# Patient Record
Sex: Female | Born: 1940
Health system: Southern US, Community
[De-identification: ages and names within clinical notes are randomized; demographics above are authoritative.]

## PROBLEM LIST (undated history)

## (undated) DIAGNOSIS — N183 Chronic kidney disease, stage 3 (moderate): Secondary | ICD-10-CM

## (undated) DIAGNOSIS — E785 Hyperlipidemia, unspecified: Secondary | ICD-10-CM

## (undated) DIAGNOSIS — I2510 Atherosclerotic heart disease of native coronary artery without angina pectoris: Secondary | ICD-10-CM

## (undated) DIAGNOSIS — I10 Essential (primary) hypertension: Secondary | ICD-10-CM

## (undated) HISTORY — DX: Essential (primary) hypertension: I10

## (undated) HISTORY — PX: CORONARY ANGIOPLASTY WITH STENT PLACEMENT: SHX49

---

## 2013-10-02 DIAGNOSIS — Z1329 Encounter for screening for other suspected endocrine disorder: Secondary | ICD-10-CM | POA: Diagnosis not present

## 2013-10-02 DIAGNOSIS — I10 Essential (primary) hypertension: Secondary | ICD-10-CM | POA: Diagnosis not present

## 2013-10-02 DIAGNOSIS — E559 Vitamin D deficiency, unspecified: Secondary | ICD-10-CM | POA: Diagnosis not present

## 2013-10-02 DIAGNOSIS — Z136 Encounter for screening for cardiovascular disorders: Secondary | ICD-10-CM | POA: Diagnosis not present

## 2013-10-02 DIAGNOSIS — Z13220 Encounter for screening for lipoid disorders: Secondary | ICD-10-CM | POA: Diagnosis not present

## 2013-10-02 DIAGNOSIS — M1990 Unspecified osteoarthritis, unspecified site: Secondary | ICD-10-CM | POA: Diagnosis not present

## 2014-03-22 ENCOUNTER — Observation Stay (HOSPITAL_COMMUNITY)
Admission: EM | Admit: 2014-03-22 | Discharge: 2014-03-25 | Disposition: A | Discharge status: Home / Self Care | Payer: Medicare Other | Attending: Family Medicine | Admitting: Family Medicine

## 2014-03-22 ENCOUNTER — Encounter (HOSPITAL_COMMUNITY): Payer: Self-pay | Admitting: Emergency Medicine

## 2014-03-22 DIAGNOSIS — Z23 Encounter for immunization: Secondary | ICD-10-CM | POA: Insufficient documentation

## 2014-03-22 DIAGNOSIS — E43 Unspecified severe protein-calorie malnutrition: Secondary | ICD-10-CM

## 2014-03-22 DIAGNOSIS — I872 Venous insufficiency (chronic) (peripheral): Secondary | ICD-10-CM

## 2014-03-22 DIAGNOSIS — I160 Hypertensive urgency: Secondary | ICD-10-CM

## 2014-03-22 DIAGNOSIS — I2510 Atherosclerotic heart disease of native coronary artery without angina pectoris: Secondary | ICD-10-CM | POA: Insufficient documentation

## 2014-03-22 DIAGNOSIS — E876 Hypokalemia: Secondary | ICD-10-CM

## 2014-03-22 DIAGNOSIS — D509 Iron deficiency anemia, unspecified: Secondary | ICD-10-CM

## 2014-03-22 DIAGNOSIS — R4701 Aphasia: Secondary | ICD-10-CM | POA: Insufficient documentation

## 2014-03-22 DIAGNOSIS — Z9861 Coronary angioplasty status: Secondary | ICD-10-CM | POA: Insufficient documentation

## 2014-03-22 DIAGNOSIS — Z681 Body mass index (BMI) 19 or less, adult: Secondary | ICD-10-CM | POA: Insufficient documentation

## 2014-03-22 DIAGNOSIS — G319 Degenerative disease of nervous system, unspecified: Secondary | ICD-10-CM | POA: Insufficient documentation

## 2014-03-22 DIAGNOSIS — I10 Essential (primary) hypertension: Principal | ICD-10-CM | POA: Diagnosis present

## 2014-03-22 HISTORY — DX: Atherosclerotic heart disease of native coronary artery without angina pectoris: I25.10

## 2014-03-22 LAB — CBC WITH DIFFERENTIAL/PLATELET
Basophils Absolute: 0 10*3/uL (ref 0.0–0.1)
Basophils Relative: 0 % (ref 0–1)
Eosinophils Absolute: 0 10*3/uL (ref 0.0–0.7)
Eosinophils Relative: 1 % (ref 0–5)
HCT: 31.3 % — ABNORMAL LOW (ref 36.0–46.0)
Hemoglobin: 10.2 g/dL — ABNORMAL LOW (ref 12.0–15.0)
Lymphocytes Relative: 19 % (ref 12–46)
Lymphs Abs: 1 10*3/uL (ref 0.7–4.0)
MCH: 25.9 pg — ABNORMAL LOW (ref 26.0–34.0)
MCHC: 32.6 g/dL (ref 30.0–36.0)
MCV: 79.4 fL (ref 78.0–100.0)
Monocytes Absolute: 0.6 10*3/uL (ref 0.1–1.0)
Monocytes Relative: 11 % (ref 3–12)
Neutro Abs: 3.6 10*3/uL (ref 1.7–7.7)
Neutrophils Relative %: 69 % (ref 43–77)
Platelets: 509 10*3/uL — ABNORMAL HIGH (ref 150–400)
RBC: 3.94 MIL/uL (ref 3.87–5.11)
RDW: 19.2 % — ABNORMAL HIGH (ref 11.5–15.5)
WBC: 5.2 10*3/uL (ref 4.0–10.5)

## 2014-03-22 LAB — I-STAT CHEM 8, ED
BUN: 14 mg/dL (ref 6–23)
Calcium, Ion: 1.18 mmol/L (ref 1.13–1.30)
Chloride: 103 mEq/L (ref 96–112)
Creatinine, Ser: 1.2 mg/dL — ABNORMAL HIGH (ref 0.50–1.10)
Glucose, Bld: 121 mg/dL — ABNORMAL HIGH (ref 70–99)
HCT: 35 % — ABNORMAL LOW (ref 36.0–46.0)
Hemoglobin: 11.9 g/dL — ABNORMAL LOW (ref 12.0–15.0)
Potassium: 3.2 mEq/L — ABNORMAL LOW (ref 3.7–5.3)
Sodium: 142 mEq/L (ref 137–147)
TCO2: 26 mmol/L (ref 0–100)

## 2014-03-22 MED ORDER — METOPROLOL TARTRATE 100 MG PO TABS: 50.0000 mg | ORAL_TABLET | Freq: Two times a day (BID) | ORAL | Status: DC

## 2014-03-22 MED ORDER — CLONIDINE HCL 0.2 MG PO TABS
0.3000 mg | ORAL_TABLET | Freq: Once | ORAL | Status: DC
Start: 2014-03-22 — End: 2014-03-22

## 2014-03-22 MED ORDER — LISINOPRIL 10 MG PO TABS: 10.0000 mg | ORAL_TABLET | Freq: Every day | ORAL | Status: DC

## 2014-03-22 MED ORDER — CLONIDINE HCL 0.2 MG PO TABS
0.2000 mg | ORAL_TABLET | Freq: Once | ORAL | Status: AC
  Administered 2014-03-22: 0.2 mg via ORAL
  Filled 2014-03-22: qty 1

## 2014-03-22 MED ORDER — NICARDIPINE HCL IN NACL 20-0.86 MG/200ML-% IV SOLN
3.0000 mg/h | Freq: Once | INTRAVENOUS | Status: AC
  Administered 2014-03-22: 5 mg/h via INTRAVENOUS
  Filled 2014-03-22: qty 200

## 2014-03-22 MED ORDER — LABETALOL HCL 100 MG PO TABS
100.0000 mg | ORAL_TABLET | Freq: Once | ORAL | Status: AC
  Administered 2014-03-22: 100 mg via ORAL
  Filled 2014-03-22: qty 1

## 2014-03-22 MED ORDER — LABETALOL HCL 5 MG/ML IV SOLN
10.0000 mg | Freq: Once | INTRAVENOUS | Status: AC
  Administered 2014-03-22: 10 mg via INTRAVENOUS
  Filled 2014-03-22: qty 4

## 2014-03-22 MED ORDER — CLONIDINE HCL 0.1 MG PO TABS
0.1000 mg | ORAL_TABLET | Freq: Once | ORAL | Status: AC
  Administered 2014-03-23: 0.1 mg via ORAL
  Filled 2014-03-22: qty 1

## 2014-03-22 NOTE — ED Provider Notes (Signed)
Patient presents with hypertension. She is asymptomatic, but measured her BP today and it was elevated. She received Labetalol PO here. We will recheck vital signs and if systolic BP drops below 914 can likely d/c home.   Patient's BP is rising with 100mg  PO Labetalol. Discussed case with Dr. Alvino Chapel. Will give 10 mg IV Labetalol and admit patient to medicine as she has no follow up.   Discussed case with Dr. Arnoldo Morale. Will start patient on Cardene drip and admit to step down. Vital signs stable at this time.   11:43 PM Discussed case with Dr. Jimmy Footman who recommends stopping cardene drip and starting clonidine 0.2, 0.1 in one hour, 0.1 in 30 minutes. If her systolic drops below 782 she can go to step down unit.   Vital signs are improving. Patient is stable for step down unit. Discussed this with Dr. Arnoldo Morale who will admit patient. Admission is appreciated. Patient / Family / Caregiver informed of clinical course, understand medical decision-making process, and agree with plan.   Filed Vitals:   03/23/14 0130 03/23/14 0135 03/23/14 0138 03/23/14 0200  BP: 188/107  188/107   Pulse:      Temp:  99 F (37.2 C)    TempSrc:  Oral    Resp: 21     Height:    5\' 5"  (1.651 m)  Weight:    107 lb 2.3 oz (48.6 kg)  SpO2:         Results for orders placed during the hospital encounter of 03/22/14  CBC WITH DIFFERENTIAL      Result Value Ref Range   WBC 5.2  4.0 - 10.5 K/uL   RBC 3.94  3.87 - 5.11 MIL/uL   Hemoglobin 10.2 (*) 12.0 - 15.0 g/dL   HCT 31.3 (*) 36.0 - 46.0 %   MCV 79.4  78.0 - 100.0 fL   MCH 25.9 (*) 26.0 - 34.0 pg   MCHC 32.6  30.0 - 36.0 g/dL   RDW 19.2 (*) 11.5 - 15.5 %   Platelets 509 (*) 150 - 400 K/uL   Neutrophils Relative % 69  43 - 77 %   Neutro Abs 3.6  1.7 - 7.7 K/uL   Lymphocytes Relative 19  12 - 46 %   Lymphs Abs 1.0  0.7 - 4.0 K/uL   Monocytes Relative 11  3 - 12 %   Monocytes Absolute 0.6  0.1 - 1.0 K/uL   Eosinophils Relative 1  0 - 5 %   Eosinophils  Absolute 0.0  0.0 - 0.7 K/uL   Basophils Relative 0  0 - 1 %   Basophils Absolute 0.0  0.0 - 0.1 K/uL  I-STAT CHEM 8, ED      Result Value Ref Range   Sodium 142  137 - 147 mEq/L   Potassium 3.2 (*) 3.7 - 5.3 mEq/L   Chloride 103  96 - 112 mEq/L   BUN 14  6 - 23 mg/dL   Creatinine, Ser 1.20 (*) 0.50 - 1.10 mg/dL   Glucose, Bld 121 (*) 70 - 99 mg/dL   Calcium, Ion 1.18  1.13 - 1.30 mmol/L   TCO2 26  0 - 100 mmol/L   Hemoglobin 11.9 (*) 12.0 - 15.0 g/dL   HCT 35.0 (*) 36.0 - 46.0 %     Elwyn Lade, PA-C 03/22/14 [place], PA-C 03/23/14 0215

## 2014-03-22 NOTE — ED Notes (Signed)
MD at bedside. 

## 2014-03-22 NOTE — ED Notes (Signed)
Pt here for recheck of blood pressure. Per family pt was seen here a while back and was told her blood pressure was elevated and she needed to follow up with a doctor. Pt does not have a pcp. Pt denies any associated sx, no headache, cp, sob, or visual changes. Pt alert and oriented x4.

## 2014-03-22 NOTE — ED Notes (Signed)
PT is here with family to have blood pressure evaluated because months ago had high blood pressure but nothing done about it.  Just here to see if she needs medication for it.  Pt denies weakness, headache, blurred vision, or chest pain

## 2014-03-22 NOTE — ED Notes (Signed)
PA at bedside.

## 2014-03-22 NOTE — ED Provider Notes (Signed)
CSN: [id number]     Arrival date & time 03/22/14  1618 History   First MD Initiated Contact with Patient 03/22/14 1905     Chief Complaint  Patient presents with  . Hypertension     (Consider location/radiation/quality/duration/timing/severity/associated sxs/prior Treatment) HPI Comments: Patient with no known medical history.  She presents with complaints of elevated blood pressure.  She was seen at an Urgent Care several months ago and was told it was elevated.  She never followed up.  Today her son checked her BP and it was markedly elevated and brings her for evaluation of this.  According to the patient and the family, she had a blocked artery "in the 70's" and spent a month in the hospital, but did not see another doctor until her urgent care visit described above.  She has no symptoms.  Denies headache, chest pain.  Patient is a 73 y.o. female presenting with hypertension. The history is provided by the patient.  Hypertension This is a new problem. The problem occurs constantly. The problem has not changed since onset.Pertinent negatives include no chest pain, no abdominal pain, no headaches and no shortness of breath. Nothing aggravates the symptoms. Nothing relieves the symptoms. She has tried nothing for the symptoms. The treatment provided no relief.    Past Medical History  Diagnosis Date  . Coronary artery disease    Past Surgical History  Procedure Laterality Date  . Coronary angioplasty with stent placement     No family history on file. History  Substance Use Topics  . Smoking status: Never Smoker   . Smokeless tobacco: Not on file  . Alcohol Use: No   OB History   Grav Para Term Preterm Abortions TAB SAB Ect Mult Living                 Review of Systems  Respiratory: Negative for shortness of breath.   Cardiovascular: Negative for chest pain.  Gastrointestinal: Negative for abdominal pain.  Neurological: Negative for headaches.  All other systems reviewed  and are negative.     Allergies  Review of patient's allergies indicates no known allergies.  Home Medications   Prior to Admission medications   Not on File   BP 222/101  Pulse 95  Temp(Src) 98.6 F (37 C) (Oral)  Resp 20  SpO2 99% Physical Exam  Nursing note and vitals reviewed. Constitutional: She is oriented to person, place, and time. She appears well-developed and well-nourished. No distress.  HENT:  Head: Normocephalic and atraumatic.  Mouth/Throat: Oropharynx is clear and moist.  Eyes: EOM are normal. Pupils are equal, round, and reactive to light.  Neck: Normal range of motion. Neck supple.  Cardiovascular: Normal rate and regular rhythm.  Exam reveals no gallop and no friction rub.   No murmur heard. Pulmonary/Chest: Effort normal and breath sounds normal. No respiratory distress. She has no wheezes.  Abdominal: Soft. Bowel sounds are normal. She exhibits no distension. There is no tenderness.  Musculoskeletal: Normal range of motion.  Neurological: She is alert and oriented to person, place, and time. No cranial nerve deficit. She exhibits normal muscle tone. Coordination normal.  Skin: Skin is warm and dry. She is not diaphoretic.    ED Course  Procedures (including critical care time) Labs Review Labs Reviewed  I-STAT CHEM 8, ED - Abnormal; Notable for the following:    Potassium 3.2 (*)    Creatinine, Ser 1.20 (*)    Glucose, Bld 121 (*)    Hemoglobin 11.9 (*)  HCT 35.0 (*)    All other components within normal limits  URINALYSIS, ROUTINE W REFLEX MICROSCOPIC    Imaging Review No results found.   EKG Interpretation   Date/Time:  Friday March 22 2014 19:37:28 EDT Ventricular Rate:  86 PR Interval:  185 QRS Duration: 108 QT Interval:  384 QTC Calculation: 459 R Axis:   23 Text Interpretation:  Sinus rhythm Probable left atrial enlargement Left  ventricular hypertrophy Anterior infarct, old Confirmed by DELOS  MD,  Tomoko Sandra ([id number]) on  03/23/2014 1:10:47 PM      MDM   Final diagnoses:  None    Patient presents with markedly elevated blood pressure, but no other symptoms. Suspect her blood pressure has been elevated for some time and she has not been to a physician in many years. Workup reveals essentially unremarkable laboratory studies and EKG not showing any acute process. She is neurologically intact.  She will be given labetalol and we will follow her blood pressure to see if it improves. She will be rechecked in one hour and the disposition will be left up to [facility].    Veryl Speak, MD 03/23/14 [phone]

## 2014-03-23 DIAGNOSIS — I059 Rheumatic mitral valve disease, unspecified: Secondary | ICD-10-CM

## 2014-03-23 DIAGNOSIS — I872 Venous insufficiency (chronic) (peripheral): Secondary | ICD-10-CM | POA: Diagnosis present

## 2014-03-23 DIAGNOSIS — D509 Iron deficiency anemia, unspecified: Secondary | ICD-10-CM | POA: Diagnosis present

## 2014-03-23 DIAGNOSIS — E876 Hypokalemia: Secondary | ICD-10-CM | POA: Diagnosis present

## 2014-03-23 DIAGNOSIS — I10 Essential (primary) hypertension: Secondary | ICD-10-CM | POA: Diagnosis present

## 2014-03-23 DIAGNOSIS — E43 Unspecified severe protein-calorie malnutrition: Secondary | ICD-10-CM | POA: Insufficient documentation

## 2014-03-23 DIAGNOSIS — I8310 Varicose veins of unspecified lower extremity with inflammation: Secondary | ICD-10-CM

## 2014-03-23 LAB — BASIC METABOLIC PANEL
Anion gap: 16 — ABNORMAL HIGH (ref 5–15)
BUN: 14 mg/dL (ref 6–23)
CO2: 25 meq/L (ref 19–32)
Calcium: 9.3 mg/dL (ref 8.4–10.5)
Chloride: 100 mEq/L (ref 96–112)
Creatinine, Ser: 1.15 mg/dL — ABNORMAL HIGH (ref 0.50–1.10)
GFR calc non Af Amer: 47 mL/min — ABNORMAL LOW (ref >90)
GFR, EST AFRICAN AMERICAN: 54 mL/min — AB (ref >90)
GLUCOSE: 129 mg/dL — AB (ref 70–99)
POTASSIUM: 3.8 meq/L (ref 3.7–5.3)
Sodium: 141 mEq/L (ref 137–147)

## 2014-03-23 LAB — URINALYSIS, ROUTINE W REFLEX MICROSCOPIC
BILIRUBIN URINE: NEGATIVE
Glucose, UA: NEGATIVE mg/dL
HGB URINE DIPSTICK: NEGATIVE
Ketones, ur: NEGATIVE mg/dL
Nitrite: NEGATIVE
Protein, ur: 30 mg/dL — AB
SPECIFIC GRAVITY, URINE: 1.009 (ref 1.005–1.030)
UROBILINOGEN UA: 0.2 mg/dL (ref 0.0–1.0)
pH: 7.5 (ref 5.0–8.0)

## 2014-03-23 LAB — CBC
HCT: 30.7 % — ABNORMAL LOW (ref 36.0–46.0)
Hemoglobin: 10.1 g/dL — ABNORMAL LOW (ref 12.0–15.0)
MCH: 26 pg (ref 26.0–34.0)
MCHC: 32.9 g/dL (ref 30.0–36.0)
MCV: 79.1 fL (ref 78.0–100.0)
Platelets: 497 10*3/uL — ABNORMAL HIGH (ref 150–400)
RBC: 3.88 MIL/uL (ref 3.87–5.11)
RDW: 19.5 % — ABNORMAL HIGH (ref 11.5–15.5)
WBC: 5.4 10*3/uL (ref 4.0–10.5)

## 2014-03-23 LAB — URINE MICROSCOPIC-ADD ON

## 2014-03-23 LAB — MRSA PCR SCREENING: MRSA by PCR: NEGATIVE

## 2014-03-23 MED ORDER — ACETAMINOPHEN 325 MG PO TABS: 650.0000 mg | ORAL_TABLET | Freq: Four times a day (QID) | ORAL | Status: DC | PRN

## 2014-03-23 MED ORDER — ACETAMINOPHEN 650 MG RE SUPP: 650.0000 mg | Freq: Four times a day (QID) | RECTAL | Status: DC | PRN

## 2014-03-23 MED ORDER — OXYCODONE HCL 5 MG PO TABS: 5.0000 mg | ORAL_TABLET | ORAL | Status: DC | PRN

## 2014-03-23 MED ORDER — ONDANSETRON HCL 4 MG/2ML IJ SOLN: 4.0000 mg | Freq: Four times a day (QID) | INTRAMUSCULAR | Status: DC | PRN

## 2014-03-23 MED ORDER — SODIUM CHLORIDE 0.9 % IJ SOLN: 3.0000 mL | INTRAMUSCULAR | Status: DC | PRN

## 2014-03-23 MED ORDER — SODIUM CHLORIDE 0.9 % IJ SOLN
3.0000 mL | Freq: Two times a day (BID) | INTRAMUSCULAR | Status: DC
  Administered 2014-03-23 – 2014-03-24 (×3): 3 mL via INTRAVENOUS

## 2014-03-23 MED ORDER — ONDANSETRON HCL 4 MG PO TABS: 4.0000 mg | ORAL_TABLET | Freq: Four times a day (QID) | ORAL | Status: DC | PRN

## 2014-03-23 MED ORDER — CLONIDINE HCL 0.1 MG PO TABS
0.1000 mg | ORAL_TABLET | Freq: Two times a day (BID) | ORAL | Status: DC
  Administered 2014-03-23 – 2014-03-24 (×3): 0.1 mg via ORAL
  Filled 2014-03-23 (×5): qty 1

## 2014-03-23 MED ORDER — BIOTENE DRY MOUTH MT LIQD
15.0000 mL | Freq: Two times a day (BID) | OROMUCOSAL | Status: DC
  Administered 2014-03-23 – 2014-03-25 (×4): 15 mL via OROMUCOSAL

## 2014-03-23 MED ORDER — PNEUMOCOCCAL VAC POLYVALENT 25 MCG/0.5ML IJ INJ
0.5000 mL | INJECTION | INTRAMUSCULAR | Status: AC
  Administered 2014-03-24: 0.5 mL via INTRAMUSCULAR
  Filled 2014-03-23: qty 0.5

## 2014-03-23 MED ORDER — ENSURE COMPLETE PO LIQD: 237.0000 mL | Freq: Every day | ORAL | Status: DC

## 2014-03-23 MED ORDER — HYDROMORPHONE HCL PF 1 MG/ML IJ SOLN: 0.5000 mg | INTRAMUSCULAR | Status: DC | PRN

## 2014-03-23 MED ORDER — HYDRALAZINE HCL 20 MG/ML IJ SOLN: 10.0000 mg | Freq: Four times a day (QID) | INTRAMUSCULAR | Status: DC | PRN

## 2014-03-23 MED ORDER — CLONIDINE HCL 0.1 MG PO TABS
0.1000 mg | ORAL_TABLET | Freq: Four times a day (QID) | ORAL | Status: DC | PRN
  Administered 2014-03-24: 0.1 mg via ORAL
  Filled 2014-03-23: qty 1

## 2014-03-23 MED ORDER — ALUM & MAG HYDROXIDE-SIMETH 200-200-20 MG/5ML PO SUSP
30.0000 mL | Freq: Four times a day (QID) | ORAL | Status: DC | PRN
Start: 2014-03-23 — End: 2014-03-25

## 2014-03-23 MED ORDER — SODIUM CHLORIDE 0.9 % IV SOLN: 250.0000 mL | INTRAVENOUS | Status: DC | PRN

## 2014-03-23 MED ORDER — ENSURE PUDDING PO PUDG
1.0000 | Freq: Two times a day (BID) | ORAL | Status: DC
  Administered 2014-03-23: 1 via ORAL

## 2014-03-23 MED ORDER — CLONIDINE HCL 0.1 MG PO TABS
0.1000 mg | ORAL_TABLET | Freq: Once | ORAL | Status: AC
  Administered 2014-03-23: 0.1 mg via ORAL
  Filled 2014-03-23: qty 1

## 2014-03-23 NOTE — Progress Notes (Signed)
Patient refused Clonidine 0.1mg  PO at this time. Educated patient on importance of getting blood pressure lower. Patient voiced she wanted to pray about at this time, and did not want to take any more medication. Will continue to monitor.

## 2014-03-23 NOTE — Progress Notes (Signed)
INITIAL NUTRITION ASSESSMENT  DOCUMENTATION CODES Per approved criteria  -Severe malnutrition in the context of chronic illness   INTERVENTION:  Continue soft diet.  Ensure Complete PO daily each supplement provides 350 kcal and 13 grams of protein  Ensure Pudding PO BID, each supplement provides 170 kcal and 4 grams of protein  NUTRITION DIAGNOSIS: Inadequate oral intake related to difficulty chewing as evidenced by 13 lb weight loss PTA.   Goal: Intake to meet >90% of estimated nutrition needs.  Monitor:  PO intake, labs, weight trend.  Reason for Assessment: Consult for poor dentition, difficulty chewing  73 y.o. female  Admitting Dx: Malignant hypertension  ASSESSMENT: 73 y.o. female who has not been seen by a doctor in many years who was brought to the ED by her family when her son checked her blood pressure and found it to be very elevated at 246/114. Patient denies having any headache or chest pain or nausea or vomiting.   Nutrition Focused Physical Exam:  Subcutaneous Fat:  Orbital Region: mild depletion Upper Arm Region: WNL Thoracic and Lumbar Region: WNL  Muscle:  Temple Region: moderate depletion Clavicle Bone Region: mild depletion Clavicle and Acromion Bone Region: mild depletion Scapular Bone Region: WNL Dorsal Hand: severe depletion Patellar Region: WNL Anterior Thigh Region: mild depletion Posterior Calf Region: WNL  Edema: 2-3+ edema of BLEs  Pt meets criteria for severe MALNUTRITION in the context of chronic illness as evidenced by severe depletion of muscle mass with 11% weight loss in the past 3 months.  Patient with poor dentition, one dangling tooth on the bottom. She has trouble chewing some tough foods. Reports weight loss of ~13 lbs over the past 2-3 months. She has never tried Ensure supplements, but is willing to try.  Height: Ht Readings from Last 1 Encounters:  03/23/14 5\' 5"  (1.651 m)    Weight: Wt Readings from Last 1  Encounters:  03/23/14 107 lb 2.3 oz (48.6 kg)    Ideal Body Weight: 56.8 kg  % Ideal Body Weight: 86%  Wt Readings from Last 10 Encounters:  03/23/14 107 lb 2.3 oz (48.6 kg)    Usual Body Weight: 120 lb  % Usual Body Weight: 89%  BMI:  Body mass index is 17.83 kg/(m^2). Underweight  Estimated Nutritional Needs: Kcal: 1400-1600 Protein: 70-85 gm Fluid: 1.4-1.6 L  Skin: no issues  Diet Order: Criss Rosales  EDUCATION NEEDS: -Education needs addressed   Intake/Output Summary (Last 24 hours) at 03/23/14 1016 Last data filed at 03/23/14 0800  Gross per 24 hour  Intake    220 ml  Output    400 ml  Net   -180 ml    Last BM: None documented since admission    Labs:   Recent Labs Lab 03/22/14 1653 03/23/14 0347  NA 142 141  K 3.2* 3.8  CL 103 100  CO2  --  25  BUN 14 14  CREATININE 1.20* 1.15*  CALCIUM  --  9.3  GLUCOSE 121* 129*    CBG (last 3)  No results found for this basename: GLUCAP,  in the last 72 hours  Scheduled Meds: . antiseptic oral rinse  15 mL Mouth Rinse BID  . cloNIDine  0.1 mg Oral BID  . [START ON 03/24/2014] pneumococcal 23 valent vaccine  0.5 mL Intramuscular Tomorrow-1000  . sodium chloride  3 mL Intravenous Q12H    Continuous Infusions:   Past Medical History  Diagnosis Date  . Coronary artery disease     Past  Surgical History  Procedure Laterality Date  . Coronary angioplasty with stent placement      Molli Barrows, RD, LDN, [place] Pager [phone] After Hours Pager [phone]

## 2014-03-23 NOTE — ED Notes (Signed)
Patient becoming restless.  States she wants to go home.  Wants up out of bed.  Patient up to bathroom again.  Will send off ua due to sx

## 2014-03-23 NOTE — H&P (Signed)
[facility] History and Physical  Angellynn Kimberlin OZH:[id number] DOB: 1941/06/23 DOA: 03/22/2014  Referring physician:  PCP: No PCP Per Patient  Specialists:   Chief Complaint:  Increased Blood Pressure  HPI: Ashlee Mack is a 73 y.o. female who has not been seen by a doctor in many years who was brought to the ED by her family when her son checked her blood pressure and found it to be very elevated at 246/114.   Patient denies having any headache or chest pain or nausea or vomiting. She had been told in the past that she had high blood pressure.    She was referred for medical admission.      Review of Systems:  Constitutional: No Weight Loss, No Weight Gain, Night Sweats, Fevers, Chills, Fatigue, or Generalized Weakness HEENT: No Headaches, Difficulty Swallowing,Tooth/Dental Problems,Sore Throat,  No Sneezing, Rhinitis, Ear Ache, Nasal Congestion, or Post Nasal Drip,  Cardio-vascular:  No Chest pain, Orthopnea, PND, +Edema in lower extremities, Anasarca, Dizziness, Palpitations  Resp: No Dyspnea, No DOE, No Productive Cough, No Non-Productive Cough, No Hemoptysis, No Change in Color of Mucus,  No Wheezing.    GI: No Heartburn, Indigestion, Abdominal Pain, Nausea, Vomiting, Diarrhea, Change in Bowel Habits,  Loss of Appetite  GU: No Dysuria, Change in Color of Urine, No Urgency or Frequency.  No flank pain.  Musculoskeletal: No Joint Pain or Swelling.  No Decreased Range of Motion. No Back Pain.  Neurologic: No Syncope, No Seizures, Muscle Weakness, Paresthesia, Vision Disturbance or Loss, No Diplopia, No Vertigo, No Difficulty Walking,  Skin: No Rash or Lesions. Psych: No Change in Mood or Affect. No Depression or Anxiety. No Memory loss. No Confusion or Hallucinations   Past Medical History  Diagnosis Date  . Coronary artery disease       Past Surgical History  Procedure Laterality Date  . Coronary angioplasty with stent placement         Prior to Admission medications    Medication Sig Start Date End Date Taking? Authorizing Provider  lisinopril (PRINIVIL,ZESTRIL) 10 MG tablet Take 1 tablet (10 mg total) by mouth daily. 03/22/14   Veryl Speak, MD  metoprolol (LOPRESSOR) 100 MG tablet Take 0.5 tablets (50 mg total) by mouth 2 (two) times daily. 03/22/14   Veryl Speak, MD     No Known Allergies   Social History:  reports that she has never smoked. She does not have any smokeless tobacco history on file. She reports that she does not drink alcohol or use illicit drugs.     No family history on file.     Physical Exam:  GEN:  Pleasant Elderly 73 y.o. African American female examined and in no acute distress; cooperative with exam Filed Vitals:   03/23/14 0346 03/23/14 0400 03/23/14 0500 03/23/14 0600  BP:  117/69 103/62 115/63  Pulse:  56 60 64  Temp: 97.5 F (36.4 C)     TempSrc: Oral     Resp:  20 21 21   Height:      Weight:      SpO2:  100% 100% 100%   Blood pressure 115/63, pulse 64, temperature 97.5 F (36.4 C), temperature source Oral, resp. rate 21, height 5\' 5"  (1.651 m), weight 48.6 kg (107 lb 2.3 oz), SpO2 100.00%. PSYCH: She is alert and oriented x3; does not appear anxious does not appear depressed; affect is normal HEENT: Normocephalic and Atraumatic, Mucous membranes pink; PERRLA; EOM intact; Fundi:  Benign;  No scleral icterus, Nares: Patent, Oropharynx:  Clear, Sparse Poor Dentition, Neck:  FROM, no cervical lymphadenopathy nor thyromegaly or carotid bruit; no JVD; Breasts:: Not examined CHEST WALL: No tenderness CHEST: Normal respiration, clear to auscultation bilaterally HEART: Regular rate and rhythm; no murmurs rubs or gallops BACK: No kyphosis or scoliosis; no CVA tenderness ABDOMEN: Positive Bowel Sounds, soft non-tender; no masses, no organomegaly, Rectal Exam: Not done EXTREMITIES: No cyanosis, clubbing or 2=3+ Edema of BLEs with hyperpigmentation and cobbled stoned appearance of the distal aspect of the BLEs.; no  ulcerations. Genitalia: not examined PULSES: 2+ and symmetric SKIN: Normal hydration no rash or ulceration CNS:  Alert and Oriented x 3, No focal Deficits.    Vascular: pulses palpable throughout    Labs on Admission:  Basic Metabolic Panel:  Recent Labs Lab 03/22/14 1653 03/23/14 0347  NA 142 141  K 3.2* 3.8  CL 103 100  CO2  --  25  GLUCOSE 121* 129*  BUN 14 14  CREATININE 1.20* 1.15*  CALCIUM  --  9.3   Liver Function Tests: No results found for this basename: AST, ALT, ALKPHOS, BILITOT, PROT, ALBUMIN,  in the last 168 hours No results found for this basename: LIPASE, AMYLASE,  in the last 168 hours No results found for this basename: AMMONIA,  in the last 168 hours CBC:  Recent Labs Lab 03/22/14 1653 03/22/14 2130 03/23/14 0347  WBC  --  5.2 5.4  NEUTROABS  --  3.6  --   HGB 11.9* 10.2* 10.1*  HCT 35.0* 31.3* 30.7*  MCV  --  79.4 79.1  PLT  --  509* 497*   Cardiac Enzymes: No results found for this basename: CKTOTAL, CKMB, CKMBINDEX, TROPONINI,  in the last 168 hours  BNP (last 3 results) No results found for this basename: PROBNP,  in the last 8760 hours CBG: No results found for this basename: GLUCAP,  in the last 168 hours  Radiological Exams on Admission: No results found.   EKG: Independently reviewed.  Normal sinus Rhythm with LVH changes, Old Anterior Infarct   Assessment/Plan:   73 y.o. female with  Principal Problem:   Malignant hypertension Active Problems:   Uncontrolled hypertension   Chronic venous stasis dermatitis of both lower extremities   Hypokalemia   Anemia, iron deficiency    1.  Malignant HTN/ Uncontrolled HTN-   Placed on Cloniidne 0.1 mg PO BID with PRN IV Hydralazine for SBP > 200 and PRN Clondine for SBP > 180.   Monitor BPs,  Check TSH level.    2.  Chronic Venous Stasis of BLEs/ Edema-  Suspect that she has CHF,  2D ECHO ordered for Evaluation.   May need Diuretic Rx.    3.  Hypokalemia-  K+ Repletion , monitor K+  levels.    4.  Anemia Iron deficiency-  Send Anemia panel and FOBT.     5. DVT prophylaxis with SCDs.     6. Other-   Needs Referral to a PCP for continued management.       Code Status:  FULL CODE     Family Communication:   Family at Bedside Disposition Plan:       Inpatient  Time spent:  [place] C [facility] Pager [phone]  If 7PM-7AM, please contact night-coverage [email or web address] Password [email or web address] 03/23/2014, 7:12 AM

## 2014-03-23 NOTE — Care Management Utilization Note (Signed)
UR completed.    Genesia Caslin Wise Jeanie Mccard, RN, BSN Phone #[phone]  

## 2014-03-23 NOTE — ED Notes (Signed)
Patient voided again prior to transport, clear yellow urine.

## 2014-03-23 NOTE — ED Provider Notes (Signed)
Medical screening examination/treatment/procedure(s) were performed by non-physician practitioner and as supervising physician I was immediately available for consultation/collaboration.   EKG Interpretation   Date/Time:  Friday March 22 2014 19:37:28 EDT Ventricular Rate:  86 PR Interval:  185 QRS Duration: 108 QT Interval:  384 QTC Calculation: 459 R Axis:   23 Text Interpretation:  Sinus rhythm Probable left atrial enlargement Left  ventricular hypertrophy Anterior infarct, old Confirmed by DELOS  MD,  DOUGLAS ([id number]) on 03/23/2014 1:10:47 PM       Jasper Riling. Alvino Chapel, MD 03/23/14 1450

## 2014-03-23 NOTE — ED Notes (Signed)
Patient is resting.  She denies any sob/chest pain.  She and family updated on plan and new medication that was given.  Advised that we will repeat in 1 hour as well.  She remains on monitoring.

## 2014-03-23 NOTE — Progress Notes (Signed)
Patient seen and evaluated earlier this AM by my associate.   Will reassess next am.  Velvet Bathe

## 2014-03-23 NOTE — Progress Notes (Signed)
Echo Lab  2D Echocardiogram completed.  [place], RDCS 03/23/2014 10:21 AM

## 2014-03-23 NOTE — ED Notes (Signed)
Patient requesting to urinate again.  Report given to [facility].

## 2014-03-24 ENCOUNTER — Observation Stay (HOSPITAL_COMMUNITY): Payer: Medicare Other

## 2014-03-24 LAB — GLUCOSE, CAPILLARY: Glucose-Capillary: 105 mg/dL — ABNORMAL HIGH (ref 70–99)

## 2014-03-24 MED ORDER — AMLODIPINE BESYLATE 10 MG PO TABS: 10.0000 mg | ORAL_TABLET | Freq: Every day | ORAL | Status: DC

## 2014-03-24 MED ORDER — DOCUSATE SODIUM 100 MG PO CAPS
100.0000 mg | ORAL_CAPSULE | Freq: Every day | ORAL | Status: DC | PRN
  Administered 2014-03-24: 100 mg via ORAL
  Filled 2014-03-24: qty 1

## 2014-03-24 MED ORDER — HYDRALAZINE HCL 20 MG/ML IJ SOLN
10.0000 mg | Freq: Four times a day (QID) | INTRAMUSCULAR | Status: DC | PRN
  Administered 2014-03-24 (×2): 10 mg via INTRAVENOUS
  Filled 2014-03-24 (×2): qty 1

## 2014-03-24 MED ORDER — LORAZEPAM 2 MG/ML IJ SOLN
1.0000 mg | Freq: Once | INTRAMUSCULAR | Status: AC
  Administered 2014-03-24: 1 mg via INTRAVENOUS

## 2014-03-24 MED ORDER — LISINOPRIL 5 MG PO TABS
5.0000 mg | ORAL_TABLET | Freq: Every day | ORAL | Status: DC
  Filled 2014-03-24 (×2): qty 1

## 2014-03-24 MED ORDER — AMLODIPINE BESYLATE 10 MG PO TABS
10.0000 mg | ORAL_TABLET | Freq: Every day | ORAL | Status: DC
  Filled 2014-03-24 (×2): qty 1

## 2014-03-24 MED ORDER — LORAZEPAM 2 MG/ML IJ SOLN
INTRAMUSCULAR | Status: AC
  Administered 2014-03-24: 1 mg
  Filled 2014-03-24: qty 1

## 2014-03-24 NOTE — Progress Notes (Addendum)
RN alerted by family member that patient very lethargic and has facial droop.  Upon assessment, patient only awakes to sternal rub but is able to follow commands.  All extremities equal in strength, facial symmetry noted but patient non-verbal, unable to speak & falling asleep during assessment.  Dr. Wendee Beavers notified.  CBG 105, BP 188/90, HR 70, O2 98% on RA, RR 26. Orders for CT head received.  Will continue to monitor closely. Jonavon Trieu, Ardeth Sportsman, RN

## 2014-03-24 NOTE — Progress Notes (Addendum)
Patient refusing to to take her BP meds until her husband arrives.  Patient anxious and confused when family is not present.  Patient does not want to have to be on any pills, says she has never taken pills in her whole life.  RN educated patient on reason why she was taking these medications; RN crushed meds and put in puree for patient.  Patient still wary about taking meds.  RN will attempt at a later time when husband is present. Ossie Yebra, Ardeth Sportsman, RN

## 2014-03-24 NOTE — Progress Notes (Signed)
[facility] PROGRESS NOTE  Ashlee Mack TGG:[id number] DOB: 12/04/1940 DOA: 03/22/2014 PCP: No PCP Per Patient  Assessment/Plan:  Principal Problem:   Malignant hypertension - Start patient on Amlodipine and lisinopril.  Lisinopril indicated in lieu of Grade I diastolic dysfunction - Hydralazine IV PRN - Avoiding B blocker due to documented bradycardia  Active Problems:   Hypokalemia - resolved - continue nutritional supplementation.    Anemia, iron deficiency - stable currently    Protein-calorie malnutrition, severe - encourage po intake - Ensure on board - Registered dietitian on board  Code Status:full  Family Communication: no family at bedside Disposition Plan: Pending improvement in blood pressures   Consultants:  None  Procedures:  none  Antibiotics:   none  HPI/Subjective: Pt has no new complaints. No acute issues overnight. Her main concern is for a stool softener  Objective: Filed Vitals:   03/24/14 1028  BP: 202/91  Pulse:   Temp:   Resp: 26    Intake/Output Summary (Last 24 hours) at 03/24/14 1108 Last data filed at 03/24/14 0900  Gross per 24 hour  Intake    543 ml  Output   1050 ml  Net   -507 ml   Filed Weights   03/23/14 0200  Weight: 48.6 kg (107 lb 2.3 oz)    Exam:   General:  Pt in NAD, alert and awake  Cardiovascular: RRR, no MRG  Respiratory: CTA BL, no wheezes  Abdomen: soft, NT, ND  Musculoskeletal: no cyanosis or clubbing   Data Reviewed: Basic Metabolic Panel:  Recent Labs Lab 03/22/14 1653 03/23/14 0347  NA 142 141  K 3.2* 3.8  CL 103 100  CO2  --  25  GLUCOSE 121* 129*  BUN 14 14  CREATININE 1.20* 1.15*  CALCIUM  --  9.3   Liver Function Tests: No results found for this basename: AST, ALT, ALKPHOS, BILITOT, PROT, ALBUMIN,  in the last 168 hours No results found for this basename: LIPASE, AMYLASE,  in the last 168 hours No results found for this basename: AMMONIA,  in the last 168  hours CBC:  Recent Labs Lab 03/22/14 1653 03/22/14 2130 03/23/14 0347  WBC  --  5.2 5.4  NEUTROABS  --  3.6  --   HGB 11.9* 10.2* 10.1*  HCT 35.0* 31.3* 30.7*  MCV  --  79.4 79.1  PLT  --  509* 497*   Cardiac Enzymes: No results found for this basename: CKTOTAL, CKMB, CKMBINDEX, TROPONINI,  in the last 168 hours BNP (last 3 results) No results found for this basename: PROBNP,  in the last 8760 hours CBG: No results found for this basename: GLUCAP,  in the last 168 hours  Recent Results (from the past 240 hour(s))  MRSA PCR SCREENING     Status: None   Collection Time    03/23/14  2:10 AM      Result Value Ref Range Status   MRSA by PCR NEGATIVE  NEGATIVE Final   Comment:            The GeneXpert MRSA Assay (FDA     approved for NASAL specimens     only), is one component of a     comprehensive MRSA colonization     surveillance program. It is not     intended to diagnose MRSA     infection nor to guide or     monitor treatment for     MRSA infections.     Studies: No results  found.  Scheduled Meds: . amLODipine  10 mg Oral Daily  . antiseptic oral rinse  15 mL Mouth Rinse BID  . feeding supplement (ENSURE COMPLETE)  237 mL Oral Q1400  . feeding supplement (ENSURE)  1 Container Oral BID BM  . lisinopril  5 mg Oral Daily  . sodium chloride  3 mL Intravenous Q12H   Continuous Infusions:    Time spent: > 35 minutes    Velvet Bathe  [facility] Pager [phone] If 7PM-7AM, please contact night-coverage at [email or web address], password [facility] 03/24/2014, 11:08 AM  LOS: 2 days

## 2014-03-24 NOTE — Care Management Note (Addendum)
    Page 1 of 1   03/25/2014     10:57:23 AM CARE MANAGEMENT NOTE 03/25/2014  Patient:  Ashlee Mack, Ashlee Mack   Account Number:  [id number]  Date Initiated:  03/24/2014  Documentation initiated by:  [facility]  Subjective/Objective Assessment:   FVO:HKGOVPCHE Blood Pressure;246/114     Action/Plan:   discharge planning   Anticipated DC Date:  03/25/2014   Anticipated DC Plan:  [place]  CM consult      Choice offered to / List presented to:             Status of service:  Completed, signed off Medicare Important Message given?  YES (If response is "NO", the following Medicare IM given date fields will be blank) Date Medicare IM given:  03/25/2014 Medicare IM given by:  Elissa Hefty Date Additional Medicare IM given:   Additional Medicare IM given by:    Discharge Disposition:  HOME/SELF CARE  Per UR Regulation:  Reviewed for med. necessity/level of care/duration of stay  If discussed at [place] of Stay Meetings, dates discussed:    Comments:  7/13  1056a debbie Marshawn Ninneman rn,bsn gave pt and husb copy of im from medicare. gave them phone number for health connect that may be able to let them know which pcp close to them taking new pt's.  03/24/14 08:45 CM met with pt and pt's husband, Vicente Serene, in room and gave them PCP Resource lists to secure a PCP on Monday.  Pt states she will need help with this task and RN states she will pass it through report and RN will dial for pt in the room if she is still here tomorrow.  If she is discharged today, pt states her son can help her tomorrow to get a PCP.  No other CM needs were communicated.  Mariane Masters, BSN, CM [phone].

## 2014-03-25 LAB — BASIC METABOLIC PANEL
Anion gap: 16 — ABNORMAL HIGH (ref 5–15)
BUN: 18 mg/dL (ref 6–23)
CO2: 25 mEq/L (ref 19–32)
Calcium: 9.1 mg/dL (ref 8.4–10.5)
Chloride: 99 mEq/L (ref 96–112)
Creatinine, Ser: 1.24 mg/dL — ABNORMAL HIGH (ref 0.50–1.10)
GFR calc Af Amer: 49 mL/min — ABNORMAL LOW (ref >90)
GFR calc non Af Amer: 43 mL/min — ABNORMAL LOW (ref >90)
GLUCOSE: 111 mg/dL — AB (ref 70–99)
Potassium: 3.7 mEq/L (ref 3.7–5.3)
Sodium: 140 mEq/L (ref 137–147)

## 2014-03-25 MED ORDER — AMLODIPINE BESYLATE 10 MG PO TABS: 10.0000 mg | ORAL_TABLET | Freq: Every day | ORAL | Status: DC

## 2014-03-25 MED ORDER — LISINOPRIL 5 MG PO TABS: 5.0000 mg | ORAL_TABLET | Freq: Every day | ORAL | Status: DC

## 2014-03-25 NOTE — Discharge Instructions (Signed)
It is very important that you obtain a primary care physician.  The resource guide has some contacts that may assist you with finding one.    Keep a record of your blood pressures to discuss with your follow up appointment.   Hypertension Hypertension, commonly called high blood pressure, is when the force of blood pumping through your arteries is too strong. Your arteries are the blood vessels that carry blood from your heart throughout your body. A blood pressure reading consists of a higher number over a lower number, such as 110/72. The higher number (systolic) is the pressure inside your arteries when your heart pumps. The lower number (diastolic) is the pressure inside your arteries when your heart relaxes. Ideally you want your blood pressure below 120/80. Hypertension forces your heart to work harder to pump blood. Your arteries may become narrow or stiff. Having hypertension puts you at risk for heart disease, stroke, and other problems.  RISK FACTORS Some risk factors for high blood pressure are controllable. Others are not.  Risk factors you cannot control include:   Race. You may be at higher risk if you are African American.  Age. Risk increases with age.  Gender. Men are at higher risk than women before age 4 years. After age 38, women are at higher risk than men. Risk factors you can control include:  Not getting enough exercise or physical activity.  Being overweight.  Getting too much fat, sugar, calories, or salt in your diet.  Drinking too much alcohol. SIGNS AND SYMPTOMS Hypertension does not usually cause signs or symptoms. Extremely high blood pressure (hypertensive crisis) may cause headache, anxiety, shortness of breath, and nosebleed. DIAGNOSIS  To check if you have hypertension, your health care provider will measure your blood pressure while you are seated, with your arm held at the level of your heart. It should be measured at least twice using the same  arm. Certain conditions can cause a difference in blood pressure between your right and left arms. A blood pressure reading that is higher than normal on one occasion does not mean that you need treatment. If one blood pressure reading is high, ask your health care provider about having it checked again. TREATMENT  Treating high blood pressure includes making lifestyle changes and possibly taking medication. Living a healthy lifestyle can help lower high blood pressure. You may need to change some of your habits. Lifestyle changes may include:  Following the DASH diet. This diet is high in fruits, vegetables, and whole grains. It is low in salt, red meat, and added sugars.  Getting at least 2 1/2 hours of brisk physical activity every week.  Losing weight if necessary.  Not smoking.  Limiting alcoholic beverages.  Learning ways to reduce stress. If lifestyle changes are not enough to get your blood pressure under control, your health care provider may prescribe medicine. You may need to take more than one. Work closely with your health care provider to understand the risks and benefits. HOME CARE INSTRUCTIONS  Have your blood pressure rechecked as directed by your health care provider.   Only take medicine as directed by your health care provider. Follow the directions carefully. Blood pressure medicines must be taken as prescribed. The medicine does not work as well when you skip doses. Skipping doses also puts you at risk for problems.   Do not smoke.   Monitor your blood pressure at home as directed by your health care provider. SEEK MEDICAL CARE IF:  You think you are having a reaction to medicines taken.  You have recurrent headaches or feel dizzy.  You have swelling in your ankles.  You have trouble with your vision. SEEK IMMEDIATE MEDICAL CARE IF:  You develop a severe headache or confusion.  You have unusual weakness, numbness, or feel faint.  You have severe  chest or abdominal pain.  You vomit repeatedly.  You have trouble breathing. MAKE SURE YOU:   Understand these instructions.  Will watch your condition.  Will get help right away if you are not doing well or get worse. Document Released: 08/30/2005 Document Revised: 09/04/2013 Document Reviewed: 06/22/2013 [facility] Patient Information 2015 [place], [place]. This information is not intended to replace advice given to you by your health care provider. Make sure you discuss any questions you have with your health care provider.    Emergency Department Resource Guide 1) Find a Doctor and Pay Out of Pocket Although you won't have to find out who is covered by your insurance plan, it is a good idea to ask around and get recommendations. You will then need to call the office and see if the doctor you have chosen will accept you as a new patient and what types of options they offer for patients who are self-pay. Some doctors offer discounts or will set up payment plans for their patients who do not have insurance, but you will need to ask so you aren't surprised when you get to your appointment.  2) Contact Your Local Health Department Not all health departments have doctors that can see patients for sick visits, but many do, so it is worth a call to see if yours does. If you don't know where your local health department is, you can check in your phone book. The CDC also has a tool to help you locate your state's health department, and many state websites also have listings of all of their local health departments.  3) Find a [facility] If your illness is not likely to be very severe or complicated, you may want to try a walk in clinic. These are popping up all over the country in pharmacies, drugstores, and shopping centers. They're usually staffed by nurse practitioners or physician assistants that have been trained to treat common illnesses and complaints. They're usually fairly quick and  inexpensive. However, if you have serious medical issues or chronic medical problems, these are probably not your best option.  No Primary Care Doctor: - Call Health Connect at  [phone] - they can help you locate a primary care doctor that  accepts your insurance, provides certain services, etc. - Physician Referral Service- [phone]  Chronic Pain Problems: Organization         Address  Phone   Notes  [facility]  [phone] Patients need to be referred by their primary care doctor.   Medication Assistance: Organization         Address  Phone   Notes  [facility] [place] [place], [place] [address] [phone] --Must be a resident of [facility] -- Must have NO insurance coverage whatsoever (no Medicaid/ Medicare, etc.) -- The pt. MUST have a primary care doctor that directs their care regularly and follows them in the community   MedAssist  [phone]   [employer]  [phone]    Agencies that provide inexpensive medical care: Patent attorney  Notes  [place]  [phone]   [facility]    [phone]   [facility] [place], [place] [address] [phone]   [place] [address], [place] [phone]   Planned Parenthood    [phone]   [facility]    [phone]   [place] and [place] [address], [place] Phone:  [phone], Fax:  [phone] Hours of Operation:  9 am - 6 pm, M-F.  Also accepts Medicaid/Medicare and self-pay.  [facility] for [place], [address], [place] Phone: [phone], Fax: [phone]. Hours of Operation:  8:30 am - 5:30 pm, M-F.  Also accepts Medicaid and self-pay.  [facility] [place] [address], [place] Phone: [phone]   [place], [place], [place] [phone], Ext. [phone] Mondays & Thursdays: 7-9 AM.  First 15 patients are seen on a first come, first serve basis.    [place] Providers:  Organization         Address  Phone   Notes  [facility] [address], [place] [phone] Also accepts self-pay patients.  [facility] [address], [place]  [phone]   [place], [address], [place] [phone]   [facility] [address] [place] [phone]   Lucianne Lei [address], [place]   [phone] Only accepts [place] Access [place] patients after they have their name applied to their card.   Self-Pay (no insurance) in [facility]:  Organization         Address  Phone   Notes  Sickle Cell Patients, [facility] [place] [phone]   [facility] [place] [phone]   [facility] [place]  [place], [place], [place] [phone]   [facility]/Dr. Osei-Bonsu  [address] [place] or [address], [place] [phone] Phone number for both [place] and [place] locations is the same.  [facility] [address], [place] [phone]   [facility] [address], [place] or [address] [phone]   [facility] [address], [place] [phone], phone; [phone], fax Sees patients 1st and 3rd Saturday of every month.  Must not qualify for public or private insurance (i.e. Medicaid, Medicare, [place] Health Choice, Veterans' Benefits)  Household income should be no more than 200% of the poverty level The clinic cannot treat you if you are pregnant or  think you are pregnant  Sexually transmitted diseases are not treated at the clinic.    Dental Care: Organization         Address  Phone  Notes  [facility] Department of [facility] [place] [phone] Accepts children up to age 75 who are enrolled in [place] or [place]; pregnant women with a Medicaid card; and children who have applied for Medicaid or [place] Health Choice, but were declined, whose parents can pay a reduced fee at time of service.  [facility]  Department of [facility]  [address], [place] [phone] Accepts children up to age 20 who are enrolled in [place] or [place]; pregnant women with a Medicaid card; and children who have applied for Medicaid or  Health Choice, but were declined, whose parents can pay a reduced fee at time of service.  [place] Adult Dental Access PROGRAM  [place] [phone] Patients are seen by appointment only. Walk-ins are not accepted. [place] will see patients 73 years of age and older. Monday - Tuesday (8am-5pm) Most Wednesdays (8:30-5pm) $30 per visit, cash only  [facility] Adult Dental Access PROGRAM  [address], [facility] [phone] Patients are seen by appointment only. Walk-ins are not accepted. [place] will see patients 66 years of age and older. One Wednesday Evening (Monthly: Volunteer Based).  $30 per visit, cash only  [place]  [phone] for adults; Children under age 48, call Graduate Pediatric Dentistry at [phone]. Children aged 62-14, please call [phone] to request a pediatric application.  Dental services are provided in all areas of dental care including fillings, crowns and bridges, complete and partial dentures, implants, gum treatment, root canals, and extractions. Preventive care is also provided. Treatment is provided to both adults  and children. Patients are selected via a lottery and there is often a waiting list.   [facility] [address] [place]  [phone] [email or web address]   [facility] [address] [place], [place] [phone], Ext. [phone] Second and Fourth Thursday of each month, opens at 6:30 AM; Clinic ends at 9 AM.  Patients are seen on a first-come first-served basis, and a limited number are seen during each clinic.   [facility]  [address] Hillard Danker [place], [place] [phone]   Eligibility Requirements You must have lived in [place], [place], or [place] counties for at least the last three months.   You cannot be eligible for state or federal sponsored [employer], including [employer], [place], or [employer].   You generally cannot be eligible for healthcare insurance through your employer.    How to apply: Eligibility screenings are held every Tuesday and Wednesday afternoon from 1:00 pm until 4:00 pm. You do not need an appointment for the interview!  [facility] [address] [place], [place]   [place]  [place] Department  [place]  [phone]    Behavioral Health Resources in the Community: Intensive Outpatient Programs Organization         Address  Phone  Notes  [place]ason [place]. [address], [place], [place] [phone]   [facility] [address], [place], [place]   ADS: Alcohol & Drug Svcs [address], [place], [place]   [place] [address]  [place], [place] or [phone]   Substance Abuse Resources Organization         Address  Phone  Notes  Alcohol and Drug Services  [phone]   [place]  [phone]   [facility] [place]     Chinita Pester  [phone]   Residential & Outpatient Substance Abuse Program  [phone]   Psychological Services Organization         Address  Phone  Notes  [place]   [place] [address], [place] or [phone]    Mobile Crisis Teams Organization         Address  Phone  Notes  [facility], Mobile Crisis Care Unit  [phone]   [facility]  [address] [place], [place]   [place] [address] [place] [phone]    Self-Help/Support Groups Organization         Address  Phone             Notes  [place]. of [place] - variety of support groups  [place] Call for more information  Narcotics Anonymous (NA), [facility]aring [facility] [address], [employer] [place]  2 meetings at this location   Special educational needs teacher         Address  Phone  Notes  [facility] [place],    [place]  [phone]   [facility]  [address], [place] [address]38756, [place], [place]   [place], [place] [phone] Admissions: 8am-3pm M-F  Incentives Substance [place] [address]    [place], [place] [phone]   [facility] [address] [place], [place], [place]   [facility] [address],  [place], [place]   [facility] Programs - Intensive Outpatient [address] Kristeen [address], [place], [place]   [facility] ([place].) [place].,  [place], [place] [phone] or [phone]   [facility] (RTS) [address] [place], [place] Accepts Medicaid  [facility] [place] [address],  [place] [phone] Substance Abuse/Addiction Treatment   [facility] Organization         Address  Phone  Notes  [facility]  [phone]   Domenic Schwab, PhD [address] Arlis Porta [place], [place]   [phone] or [phone]   [place], [place] [phone]   [facility] [address], [place], [place] [phone] Insurance/Medicaid/sponsorship through [facility] and Families [address], [address] [place] [place]                                    [place], [place] [phone] [place] [address], [place] [phone]    Dr. Adele Schilder  [phone]   [facility] Dept. 1) [address] [place] 2) [place] 3)  [place] [address], [place] [phone]  [phone]   [place] [phone] or [phone] (After Hours)       STROKE/TIA DISCHARGE INSTRUCTIONS SMOKING Cigarette smoking nearly doubles your risk of having a stroke & is the single most alterable risk factor  If you smoke or have smoked in the last 12 months, you are advised to quit smoking for your health.  Most of the excess cardiovascular risk related to smoking disappears within a year of stopping.  Ask you doctor about anti-smoking medications  [place] Quit Line: 1-800-QUIT NOW  Free Smoking Cessation Classes [phone]  CHOLESTEROL Know your levels;  limit fat & cholesterol in your diet  Lipid Panel  No results found for this basename: chol, trig, hdl, cholhdl, vldl, ldlcalc      Many patients benefit from treatment even if their cholesterol is at goal.  Goal: Total Cholesterol (CHOL) less than 160  Goal:  Triglycerides (TRIG) less than 150  Goal:  HDL greater than 40  Goal:  LDL (LDLCALC) less than 100   BLOOD PRESSURE American Stroke Association blood pressure target is less that 120/80 mm/Hg  Your discharge blood  pressure is:  BP: 142/79 mmHg  Monitor your blood pressure  Limit your salt and alcohol intake  Many individuals will require more than one medication for high blood pressure  DIABETES (A1c is a blood sugar average for last 3 months) Goal HGBA1c is under 7% (HBGA1c is blood sugar average for last 3 months)  Diabetes: No known diagnosis of diabetes    No results found for this basename: HGBA1C     Your HGBA1c can be lowered with medications, healthy diet, and exercise.  Check your blood sugar as directed by your physician  Call your physician if you experience unexplained or low blood sugars.  PHYSICAL ACTIVITY/REHABILITATION Goal is 30 minutes at least 4 days per week  Activity: Increase activity slowly, Therapies:  Return to work:   Activity decreases your risk of heart attack and stroke and makes your heart stronger.  It helps control your weight and blood pressure; helps you relax and can improve your mood.  Participate in a regular exercise program.  Talk with your doctor about the best form of exercise for you (dancing, walking, swimming, cycling).  DIET/WEIGHT Goal is to maintain a healthy weight  Your discharge diet is: soft foods Your height is:  Height: 5\' 5"  (165.1 cm) Your current weight is: Weight: 48.6 kg (107 lb 2.3 oz) Your Body Mass Index (BMI) is:  BMI (Calculated): 17.9  Following the type of diet specifically designed for you will help prevent another stroke.  Your goal weight range is:  114-155 lbs  Your goal Body Mass Index (BMI) is 19-24.  Healthy food habits can help reduce 3 risk factors for stroke:  High cholesterol, hypertension, and excess weight.  RESOURCES Stroke/Support Group:  Call [phone]   STROKE EDUCATION PROVIDED/REVIEWED AND GIVEN TO PATIENT Stroke warning signs and symptoms How to activate emergency medical system (call 911). Medications prescribed at discharge. Need for follow-up after discharge. Personal risk factors for  stroke. Pneumonia vaccine given: Yes, Date 03/24/2014 Flu vaccine given: No My questions have been answered, the writing is legible, and I understand these instructions.  I will adhere to these goals & educational materials that have been provided to me after my discharge from the hospital.

## 2014-03-25 NOTE — Discharge Summary (Addendum)
Physician Discharge Summary  Ashlee Mack SEG:[id number] DOB: Jan 08, 1941 DOA: 03/22/2014  PCP: No PCP Per Patient  Admit date: 03/22/2014 Discharge date: 03/25/2014  Time spent: > 35 minutes  Recommendations for Outpatient Follow-up:  1. Reassess S creatinine since patient was recently started on ACE inhibitor 2. Continue to monitor blood pressures and adjust antihypertensive medication as needed.  Discharge Diagnoses:  Principal Problem:   Malignant hypertension Active Problems:   Hypertension   Uncontrolled hypertension   Chronic venous stasis dermatitis of both lower extremities   Hypokalemia   Anemia, iron deficiency   Protein-calorie malnutrition, severe   Discharge Condition: stable  Diet recommendation: low sodium heart healthy  Filed Weights   03/23/14 0200  Weight: 48.6 kg (107 lb 2.3 oz)    History of present illness:  73 y/o who presented to the hospital 2ary to elevated blood pressures  Hospital Course:  Hypertensive urgency - CT of head negative for acute intracranial abnormalities per radiologist report. - Place on amlodipine and lisinopril - Pt to have S creatinine rechecked on post hospital f/u  Procedures:  CT of head  Consultations:  none  Discharge Exam: Filed Vitals:   03/25/14 0900  BP: 126/70  Pulse: 93  Temp:   Resp: 30    General: pt in nad, alert and awake Cardiovascular: rrr, no mrg Respiratory: CTA BL, no wheezes  Discharge Instructions You were cared for by a hospitalist during your hospital stay. If you have any questions about your discharge medications or the care you received while you were in the hospital after you are discharged, you can call the unit and asked to speak with the hospitalist on call if the hospitalist that took care of you is not available. Once you are discharged, your primary care physician will handle any further medical issues. Please note that NO REFILLS for any discharge medications will be authorized  once you are discharged, as it is imperative that you return to your primary care physician (or establish a relationship with a primary care physician if you do not have one) for your aftercare needs so that they can reassess your need for medications and monitor your lab values.  Discharge Instructions   Call MD for:  extreme fatigue    Complete by:  As directed      Call MD for:  persistant dizziness or light-headedness    Complete by:  As directed      Call MD for:  persistant nausea and vomiting    Complete by:  As directed      Diet - low sodium heart healthy    Complete by:  As directed      Discharge instructions    Complete by:  As directed   Please review medication and read package insert. Take as directed if any questions arise please discuss with your pharmacist.  Also repeat your serum creatinine in 1 week with your primary care physician.     Increase activity slowly    Complete by:  As directed             Medication List         amLODipine 10 MG tablet  Commonly known as:  NORVASC  Take 1 tablet (10 mg total) by mouth daily.     lisinopril 5 MG tablet  Commonly known as:  PRINIVIL,ZESTRIL  Take 1 tablet (5 mg total) by mouth daily.       No Known Allergies    The results of significant  diagnostics from this hospitalization (including imaging, microbiology, ancillary and laboratory) are listed below for reference.    Significant Diagnostic Studies: Ct Head Wo Contrast  03/24/2014   CLINICAL DATA:  Aphasia earlier today, now resolved  EXAM: CT HEAD WITHOUT CONTRAST  TECHNIQUE: Contiguous axial images were obtained from the base of the skull through the vertex without intravenous contrast.  COMPARISON:  None  FINDINGS: Generalized atrophy, more pronounced at LEFT occipital lobe.  Normal ventricular morphology.  No midline shift or mass effect.  Small vessel chronic ischemic changes of deep cerebral white matter.  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  Question old external capsule infarcts bilaterally.  No intracranial hemorrhage mass lesion or evidence acute infarction.  No extra-axial fluid collection.  Bones and sinuses unremarkable.  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  No acute intracranial abnormalities.   Electronically Signed   By: Ashlee Mack M.D.   On: 03/24/2014 18:37    Microbiology: Recent Results (from the past 240 hour(s))  MRSA PCR SCREENING     Status: None   Collection Time    03/23/14  2:10 AM      Result Value Ref Range Status   MRSA by PCR NEGATIVE  NEGATIVE Final   Comment:            The GeneXpert MRSA Assay (FDA     approved for NASAL specimens     only), is one component of a     comprehensive MRSA colonization     surveillance program. It is not     intended to diagnose MRSA     infection nor to guide or     monitor treatment for     MRSA infections.     Labs: Basic Metabolic Panel:  Recent Labs Lab 03/22/14 1653 03/23/14 0347 03/24/14 2340  NA 142 141 140  K 3.2* 3.8 3.7  CL 103 100 99  CO2  --  25 25  GLUCOSE 121* 129* 111*  BUN 14 14 18   CREATININE 1.20* 1.15* 1.24*  CALCIUM  --  9.3 9.1   Liver Function Tests: No results found for this basename: AST, ALT, ALKPHOS, BILITOT, PROT, ALBUMIN,  in the last 168 hours No results found for this basename: LIPASE, AMYLASE,  in the last 168 hours No results found for this basename: AMMONIA,  in the last 168 hours CBC:  Recent Labs Lab 03/22/14 1653 03/22/14 2130 03/23/14 0347  WBC  --  5.2 5.4  NEUTROABS  --  3.6  --   HGB 11.9* 10.2* 10.1*  HCT 35.0* 31.3* 30.7*  MCV  --  79.4 79.1  PLT  --  509* 497*   Cardiac Enzymes: No results found for this basename: CKTOTAL, CKMB, CKMBINDEX, TROPONINI,  in the last 168 hours BNP: BNP (last 3 results) No results found for this basename: PROBNP,  in the last 8760  hours CBG:  Recent Labs Lab 03/24/14 1714  GLUCAP 105*       Signed:  Briellah Baik  [facility] 03/25/2014, 9:59 AM    Addendum: RR documented as 30 but on my exam patient was breathing comfortably and at normal rate 18.  She denied any respiratory difficulty.

## 2014-03-25 NOTE — Progress Notes (Signed)
DC orders received.  Patient stable with no S/S of distress.  Medication and discharge information reviewed with patient, patient's husband and patient's son.  RN stressed importance of taking medication as prescribed, establishing with and following up with a PCP and recognizing S/S of CVA & calling 911.  Patient DC home with husband and son. Ashlee Mack, [employer]

## 2014-04-09 ENCOUNTER — Encounter: Payer: Self-pay | Admitting: Internal Medicine

## 2014-04-09 ENCOUNTER — Ambulatory Visit: Payer: Medicare Other | Attending: Internal Medicine | Admitting: Internal Medicine

## 2014-04-09 VITALS — BP 161/83 | HR 94 | Temp 98.9°F | Resp 20 | Ht 65.0 in | Wt 110.8 lb

## 2014-04-09 DIAGNOSIS — I872 Venous insufficiency (chronic) (peripheral): Secondary | ICD-10-CM

## 2014-04-09 DIAGNOSIS — R7989 Other specified abnormal findings of blood chemistry: Secondary | ICD-10-CM

## 2014-04-09 DIAGNOSIS — I8310 Varicose veins of unspecified lower extremity with inflammation: Secondary | ICD-10-CM

## 2014-04-09 DIAGNOSIS — I10 Essential (primary) hypertension: Secondary | ICD-10-CM | POA: Insufficient documentation

## 2014-04-09 DIAGNOSIS — R799 Abnormal finding of blood chemistry, unspecified: Secondary | ICD-10-CM | POA: Insufficient documentation

## 2014-04-09 DIAGNOSIS — I2510 Atherosclerotic heart disease of native coronary artery without angina pectoris: Secondary | ICD-10-CM | POA: Insufficient documentation

## 2014-04-09 LAB — HEMOGLOBIN A1C
Hgb A1c MFr Bld: 6 % — ABNORMAL HIGH (ref <5.7)
Mean Plasma Glucose: 126 mg/dL — ABNORMAL HIGH (ref <117)

## 2014-04-09 NOTE — Patient Instructions (Addendum)
DASH Eating Plan DASH stands for "Dietary Approaches to Stop Hypertension." The DASH eating plan is a healthy eating plan that has been shown to reduce high blood pressure (hypertension). Additional health benefits may include reducing the risk of type 2 diabetes mellitus, heart disease, and stroke. The DASH eating plan may also help with weight loss. WHAT DO I NEED TO KNOW ABOUT THE DASH EATING PLAN? For the DASH eating plan, you will follow these general guidelines:  Choose foods with a percent daily value for sodium of less than 5% (as listed on the food label).  Use salt-free seasonings or herbs instead of table salt or sea salt.  Check with your health care provider or pharmacist before using salt substitutes.  Eat lower-sodium products, often labeled as "lower sodium" or "no salt added."  Eat fresh foods.  Eat more vegetables, fruits, and low-fat dairy products.  Choose whole grains. Look for the word "whole" as the first word in the ingredient list.  Choose fish and skinless chicken or turkey more often than red meat. Limit fish, poultry, and meat to 6 oz (170 g) each day.  Limit sweets, desserts, sugars, and sugary drinks.  Choose heart-healthy fats.  Limit cheese to 1 oz (28 g) per day.  Eat more home-cooked food and less restaurant, buffet, and fast food.  Limit fried foods.  Cook foods using methods other than frying.  Limit canned vegetables. If you do use them, rinse them well to decrease the sodium.  When eating at a restaurant, ask that your food be prepared with less salt, or no salt if possible. WHAT FOODS CAN I EAT? Seek help from a dietitian for individual calorie needs. Grains Whole grain or whole wheat bread. Brown rice. Whole grain or whole wheat pasta. Quinoa, bulgur, and whole grain cereals. Low-sodium cereals. Corn or whole wheat flour tortillas. Whole grain cornbread. Whole grain crackers. Low-sodium crackers. Vegetables Fresh or frozen vegetables  (raw, steamed, roasted, or grilled). Low-sodium or reduced-sodium tomato and vegetable juices. Low-sodium or reduced-sodium tomato sauce and paste. Low-sodium or reduced-sodium canned vegetables.  Fruits All fresh, canned (in natural juice), or frozen fruits. Meat and Other Protein Products Ground beef (85% or leaner), grass-fed beef, or beef trimmed of fat. Skinless chicken or turkey. Ground chicken or turkey. Pork trimmed of fat. All fish and seafood. Eggs. Dried beans, peas, or lentils. Unsalted nuts and seeds. Unsalted canned beans. Dairy Low-fat dairy products, such as skim or 1% milk, 2% or reduced-fat cheeses, low-fat ricotta or cottage cheese, or plain low-fat yogurt. Low-sodium or reduced-sodium cheeses. Fats and Oils Tub margarines without trans fats. Light or reduced-fat mayonnaise and salad dressings (reduced sodium). Avocado. Safflower, olive, or canola oils. Natural peanut or almond butter. Other Unsalted popcorn and pretzels. The items listed above may not be a complete list of recommended foods or beverages. Contact your dietitian for more options. WHAT FOODS ARE NOT RECOMMENDED? Grains White bread. White pasta. White rice. Refined cornbread. Bagels and croissants. Crackers that contain trans fat. Vegetables Creamed or fried vegetables. Vegetables in a cheese sauce. Regular canned vegetables. Regular canned tomato sauce and paste. Regular tomato and vegetable juices. Fruits Dried fruits. Canned fruit in light or heavy syrup. Fruit juice. Meat and Other Protein Products Fatty cuts of meat. Ribs, chicken wings, bacon, sausage, bologna, salami, chitterlings, fatback, hot dogs, bratwurst, and packaged luncheon meats. Salted nuts and seeds. Canned beans with salt. Dairy Whole or 2% milk, cream, half-and-half, and cream cheese. Whole-fat or sweetened yogurt. Full-fat   cheeses or blue cheese. Nondairy creamers and whipped toppings. Processed cheese, cheese spreads, or cheese  curds. Condiments Onion and garlic salt, seasoned salt, table salt, and sea salt. Canned and packaged gravies. Worcestershire sauce. Tartar sauce. Barbecue sauce. Teriyaki sauce. Soy sauce, including reduced sodium. Steak sauce. Fish sauce. Oyster sauce. Cocktail sauce. Horseradish. Ketchup and mustard. Meat flavorings and tenderizers. Bouillon cubes. Hot sauce. Tabasco sauce. Marinades. Taco seasonings. Relishes. Fats and Oils Butter, stick margarine, lard, shortening, ghee, and bacon fat. Coconut, palm kernel, or palm oils. Regular salad dressings. Other Pickles and olives. Salted popcorn and pretzels. The items listed above may not be a complete list of foods and beverages to avoid. Contact your dietitian for more information. WHERE CAN I FIND MORE INFORMATION? National Heart, Lung, and Blood Institute: travelstabloid.com Document Released: 08/19/2011 Document Revised: 01/14/2014 Document Reviewed: 07/04/2013 [facility] Patient Information 2015 [place], [place]. This information is not intended to replace advice given to you by your health care provider. Make sure you discuss any questions you have with your health care provider. Stasis Dermatitis Stasis dermatitis occurs when veins lose the ability to pump blood back to the heart (poor venous circulation). It causes a reddish-purple to brownish scaly, itchy rash on the legs. The rash comes from pooling of blood (stasis). CAUSES  This occurs because the veins do not work very well anymore or because pressure may be increased in the veins due to other conditions. With blood pooling, the increased pressure in the tiny blood vessels (capillaries) causes fluid to leak out of the capillaries into the tissue. The extra fluid makes it harder for the blood to feed the cells and get rid of waste products. SYMPTOMS  Stasis dermatitis appears as red, scaly, itchy patches on the legs. A yellowish or light brown discoloration is  also present. Due to scratching or other injury, these patches can become an ulcer. This ulcer may remain for long periods of time. The ulcer can also become infected. Swelling of the legs is often present with stasis dermatitis. If the leg is swollen, this increases the risk of infection and further damage to the skin. Sometimes, intense itching, tingling, and burning occurs before signs of stasis dermatitis appear. You may find yourself scratching the insides of your ankles or rubbing your ankles together before the rash appears. After healing, there are often brown spots on the affected skin. DIAGNOSIS  Your caregiver makes this diagnosis based on an exam. Other tests may be done to better understand the cause. TREATMENT  If underlying conditions are present, they must be treated. Some of these conditions are heart failure, thyroid problems, poor nutrition, and varicose veins.  Cortisone creams and ointments applied to the skin (topically) may be needed, as well as medicine to reduce swelling in the legs (diuretics).  Compression stockings or an elastic wrap may also be needed to reduce swelling.  If there is an infection, antibiotic medicines may also be used. HOME CARE INSTRUCTIONS   Try to rest and raise (elevate) the affected leg above the level of the heart, if possible.  Burow's solution wet packs applied for 30 minutes, 3 times daily, will help the weepy rash. Stop using the packs before your skin gets too dry. You can also use a mixture of 3 parts white vinegar to 1 quart water.  Grease your legs daily with ointments, such as petroleum jelly, to fight dryness.  Avoid scratching or injuring the area. SEEK IMMEDIATE MEDICAL CARE IF:   Your rash gets worse.  An ulcer forms.  You have an oral temperature above 102 F (38.9 C), not controlled by medicine.  You have any other severe symptoms. Document Released: 12/09/2005 Document Revised: 11/22/2011 Document Reviewed:  01/19/2010 [facility] Patient Information 2015 [place], [place]. This information is not intended to replace advice given to you by your health care provider. Make sure you discuss any questions you have with your health care provider.

## 2014-04-09 NOTE — Progress Notes (Signed)
Patient presents to establish care HFU for HTN; released 2 weeks ago States feeling "pretty good" today Husband and son present

## 2014-04-09 NOTE — Progress Notes (Signed)
Patient ID: Ashlee Mack, female   DOB: Aug 12, 1941, 73 y.o.   MRN: [id number]  VOJ:[id number]  [id number]  DOB - January 05, 1941  CC:  Chief Complaint  Patient presents with  . Establish Care  . Hospitalization Follow-up  . Hypertension       HPI: Ashlee Mack is a 73 y.o. female here today to establish medical care.  Patient presents today as a HFU for hypertension.  Patient was admitted inpatient for hypertension and further testing.  Patient presents with son who states her grand-daughter checks her BP daily and it ranges from 810-175 systolic.  He reports that she takes her medication daily after dinner, so she has not taken her anti-hypertensive medications today.      No Known Allergies Past Medical History  Diagnosis Date  . Coronary artery disease   . Hypertension    Current Outpatient Prescriptions on File Prior to Visit  Medication Sig Dispense Refill  . amLODipine (NORVASC) 10 MG tablet Take 1 tablet (10 mg total) by mouth daily.  30 tablet  0  . lisinopril (PRINIVIL,ZESTRIL) 5 MG tablet Take 1 tablet (5 mg total) by mouth daily.  30 tablet  0   No current facility-administered medications on file prior to visit.   Family History  Problem Relation Age of Onset  . Hypertension Mother   . Hypertension Father    History   Social History  . Marital Status: Married    Spouse Name: N/A    Number of Children: N/A  . Years of Education: N/A   Occupational History  . Not on file.   Social History Main Topics  . Smoking status: Never Smoker   . Smokeless tobacco: Not on file  . Alcohol Use: No  . Drug Use: No  . Sexual Activity: Not on file   Other Topics Concern  . Not on file   Social History Narrative  . No narrative on file   Review of Systems  HENT: Negative.   Eyes: Negative.   Respiratory: Negative.   Cardiovascular: Positive for leg swelling. Negative for chest pain and palpitations.  Musculoskeletal: Negative.      Objective:   Filed  Vitals:   04/09/14 1550  BP: 161/83  Pulse: 94  Temp: 98.9 F (37.2 C)  Resp: 20    Physical Exam: Constitutional: Patient appears well-developed and well-nourished. No distress. HENT: Normocephalic, atraumatic, External right and left ear normal. Oropharynx is clear and moist.  Eyes: Conjunctivae and EOM are normal. PERRLA, no scleral icterus. Neck: Normal ROM. Neck supple. No JVD. No tracheal deviation. No thyromegaly. CVS: RRR, S1/S2 +, no murmurs, no gallops, no carotid bruit.  Pulmonary: Effort and breath sounds normal, no stridor, rhonchi, wheezes, rales.  Abdominal: Soft. BS +, no distension, tenderness, rebound or guarding.  Musculoskeletal: Normal range of motion. 2+ edema of BLE Lymphadenopathy: No lymphadenopathy noted, cervical Neuro: Alert. Normal reflexes, muscle tone coordination. No cranial nerve deficit. Skin: Skin is warm and dry. BLE stasis dermatitis Psychiatric: Normal mood and affect. Behavior, judgment, thought content normal.  Lab Results  Component Value Date   WBC 5.4 03/23/2014   HGB 10.1* 03/23/2014   HCT 30.7* 03/23/2014   MCV 79.1 03/23/2014   PLT 497* 03/23/2014   Lab Results  Component Value Date   CREATININE 1.24* 03/24/2014   BUN 18 03/24/2014   NA 140 03/24/2014   K 3.7 03/24/2014   CL 99 03/24/2014   CO2 25 03/24/2014    No results found for this  basename: HGBA1C   Lipid Panel  No results found for this basename: chol, trig, hdl, cholhdl, vldl, ldlcalc       Assessment and plan:   Ashlee was seen today for establish care, hospitalization follow-up and hypertension.  Diagnoses and associated orders for this visit:  Essential hypertension Continue lisinopril 5 mg and amlodipine 10 mg daily. Grand-daughter may continue to monitor and call clinic if BP is elevated. Will not make changes to today to regimen Elevated serum creatinine - COMPLETE METABOLIC PANEL WITH GFR will repeat today - Hemoglobin A1c  Stasis dermatitis of both  legs Will continue to monitor.    Return in about 3 weeks (around 04/30/2014) for Nurse Visit-BP check, 3 mo PCP.  The patient was given clear instructions to go to ER or return to medical center if symptoms don't improve, worsen or new problems develop. The patient verbalized understanding.   Chari Manning, [facility] [phone] 04/09/2014, 4:04 PM

## 2014-04-10 LAB — COMPLETE METABOLIC PANEL WITH GFR
ALBUMIN: 3.7 g/dL (ref 3.5–5.2)
ALK PHOS: 58 U/L (ref 39–117)
ALT: 22 U/L (ref 0–35)
AST: 35 U/L (ref 0–37)
BUN: 19 mg/dL (ref 6–23)
CALCIUM: 9.1 mg/dL (ref 8.4–10.5)
CO2: 24 meq/L (ref 19–32)
Chloride: 102 mEq/L (ref 96–112)
Creat: 1.28 mg/dL — ABNORMAL HIGH (ref 0.50–1.10)
GFR, Est African American: 49 mL/min — ABNORMAL LOW
GFR, Est Non African American: 42 mL/min — ABNORMAL LOW
Glucose, Bld: 98 mg/dL (ref 70–99)
Potassium: 5.4 mEq/L — ABNORMAL HIGH (ref 3.5–5.3)
Sodium: 138 mEq/L (ref 135–145)
Total Bilirubin: 0.4 mg/dL (ref 0.2–1.2)
Total Protein: 8.1 g/dL (ref 6.0–8.3)

## 2014-04-16 ENCOUNTER — Other Ambulatory Visit: Payer: Self-pay | Admitting: Emergency Medicine

## 2014-04-16 ENCOUNTER — Telehealth: Payer: Self-pay | Admitting: Emergency Medicine

## 2014-04-16 DIAGNOSIS — R944 Abnormal results of kidney function studies: Secondary | ICD-10-CM

## 2014-04-16 MED ORDER — LISINOPRIL 5 MG PO TABS: 5.0000 mg | ORAL_TABLET | Freq: Every day | ORAL | Status: DC

## 2014-04-16 MED ORDER — AMLODIPINE BESYLATE 10 MG PO TABS: 10.0000 mg | ORAL_TABLET | Freq: Every day | ORAL | Status: DC

## 2014-04-16 NOTE — Telephone Encounter (Signed)
Pt son Jeannette Corpus called in requesting medication Lasix script for grandmother leg swelling. Blood pressure medication reordered and e-scribed to [facility] pharmacy Nephrology Referral placed

## 2014-04-17 ENCOUNTER — Telehealth: Payer: Self-pay | Admitting: Emergency Medicine

## 2014-04-17 NOTE — Telephone Encounter (Signed)
Message copied by Ricci Barker on Wed Apr 17, 2014  4:22 PM ------      Message from: Chari Manning A      Created: Fri Apr 12, 2014  9:58 AM       , Patient and let him know that her kidney function has improved slightly. Let him know I would like to keep an eye on her potassium level so we will recheck in one month. Please advise patient that I still would like for her to go see the nephrologist when she gets an appointment. Also let patient know that she is borderline diabetic, so she will need to make dietary changes in order to prevent the onset of diabetes. ------

## 2014-05-06 ENCOUNTER — Ambulatory Visit: Payer: Medicare Other | Attending: Internal Medicine | Admitting: *Deleted

## 2014-05-06 VITALS — BP 178/87 | HR 93

## 2014-05-06 DIAGNOSIS — I10 Essential (primary) hypertension: Secondary | ICD-10-CM

## 2014-05-06 NOTE — Patient Instructions (Signed)

## 2014-05-06 NOTE — Progress Notes (Signed)
Patient here for BP check. Patient denies any headaches, chest pain, dizziness, and blurred vision. Patient has not heard from nephrologist office yet. Ashlee Jaffe, NP made aware of BP reading. Per Ashlee Jaffe, NP patient will return in two weeks for BP check. If BP is still elevates Ashlee Jaffe, NP will make changes to BP medication regimen. Family notified about plan of care and verbalized agreement.Ashlee Birmingham, RN

## 2014-07-22 DIAGNOSIS — N183 Chronic kidney disease, stage 3 (moderate): Secondary | ICD-10-CM | POA: Diagnosis not present

## 2014-07-22 DIAGNOSIS — E875 Hyperkalemia: Secondary | ICD-10-CM | POA: Diagnosis not present

## 2014-07-22 DIAGNOSIS — I10 Essential (primary) hypertension: Secondary | ICD-10-CM | POA: Diagnosis not present

## 2014-07-30 DIAGNOSIS — I10 Essential (primary) hypertension: Secondary | ICD-10-CM | POA: Diagnosis not present

## 2014-08-12 DIAGNOSIS — I10 Essential (primary) hypertension: Secondary | ICD-10-CM | POA: Diagnosis not present

## 2014-08-16 ENCOUNTER — Other Ambulatory Visit: Payer: Self-pay | Admitting: Internal Medicine

## 2014-08-26 ENCOUNTER — Emergency Department (HOSPITAL_COMMUNITY): Payer: Medicare Other

## 2014-08-26 ENCOUNTER — Inpatient Hospital Stay (HOSPITAL_COMMUNITY)
Admission: EM | Admit: 2014-08-26 | Discharge: 2014-08-29 | DRG: 871 | Disposition: A | Discharge status: Home Health | Payer: Medicare Other | Attending: Internal Medicine | Admitting: Internal Medicine

## 2014-08-26 ENCOUNTER — Encounter (HOSPITAL_COMMUNITY): Payer: Self-pay | Admitting: Emergency Medicine

## 2014-08-26 DIAGNOSIS — N179 Acute kidney failure, unspecified: Secondary | ICD-10-CM | POA: Diagnosis present

## 2014-08-26 DIAGNOSIS — R197 Diarrhea, unspecified: Secondary | ICD-10-CM | POA: Diagnosis not present

## 2014-08-26 DIAGNOSIS — N183 Chronic kidney disease, stage 3 (moderate): Secondary | ICD-10-CM | POA: Diagnosis present

## 2014-08-26 DIAGNOSIS — I129 Hypertensive chronic kidney disease with stage 1 through stage 4 chronic kidney disease, or unspecified chronic kidney disease: Secondary | ICD-10-CM | POA: Diagnosis present

## 2014-08-26 DIAGNOSIS — N2889 Other specified disorders of kidney and ureter: Secondary | ICD-10-CM | POA: Diagnosis not present

## 2014-08-26 DIAGNOSIS — A419 Sepsis, unspecified organism: Principal | ICD-10-CM

## 2014-08-26 DIAGNOSIS — N189 Chronic kidney disease, unspecified: Secondary | ICD-10-CM | POA: Diagnosis not present

## 2014-08-26 DIAGNOSIS — I878 Other specified disorders of veins: Secondary | ICD-10-CM | POA: Diagnosis present

## 2014-08-26 DIAGNOSIS — I2510 Atherosclerotic heart disease of native coronary artery without angina pectoris: Secondary | ICD-10-CM | POA: Diagnosis present

## 2014-08-26 DIAGNOSIS — Z681 Body mass index (BMI) 19 or less, adult: Secondary | ICD-10-CM

## 2014-08-26 DIAGNOSIS — Z79899 Other long term (current) drug therapy: Secondary | ICD-10-CM

## 2014-08-26 DIAGNOSIS — I872 Venous insufficiency (chronic) (peripheral): Secondary | ICD-10-CM | POA: Diagnosis present

## 2014-08-26 DIAGNOSIS — N281 Cyst of kidney, acquired: Secondary | ICD-10-CM | POA: Diagnosis not present

## 2014-08-26 DIAGNOSIS — R112 Nausea with vomiting, unspecified: Secondary | ICD-10-CM | POA: Diagnosis present

## 2014-08-26 DIAGNOSIS — R6510 Systemic inflammatory response syndrome (SIRS) of non-infectious origin without acute organ dysfunction: Secondary | ICD-10-CM | POA: Diagnosis present

## 2014-08-26 DIAGNOSIS — I517 Cardiomegaly: Secondary | ICD-10-CM | POA: Diagnosis not present

## 2014-08-26 DIAGNOSIS — N390 Urinary tract infection, site not specified: Secondary | ICD-10-CM | POA: Diagnosis present

## 2014-08-26 DIAGNOSIS — I10 Essential (primary) hypertension: Secondary | ICD-10-CM

## 2014-08-26 DIAGNOSIS — E43 Unspecified severe protein-calorie malnutrition: Secondary | ICD-10-CM | POA: Diagnosis present

## 2014-08-26 DIAGNOSIS — A084 Viral intestinal infection, unspecified: Secondary | ICD-10-CM | POA: Diagnosis present

## 2014-08-26 DIAGNOSIS — E872 Acidosis: Secondary | ICD-10-CM | POA: Diagnosis present

## 2014-08-26 DIAGNOSIS — I2583 Coronary atherosclerosis due to lipid rich plaque: Secondary | ICD-10-CM

## 2014-08-26 DIAGNOSIS — B9689 Other specified bacterial agents as the cause of diseases classified elsewhere: Secondary | ICD-10-CM | POA: Diagnosis not present

## 2014-08-26 DIAGNOSIS — J9811 Atelectasis: Secondary | ICD-10-CM | POA: Diagnosis not present

## 2014-08-26 DIAGNOSIS — R6511 Systemic inflammatory response syndrome (SIRS) of non-infectious origin with acute organ dysfunction: Secondary | ICD-10-CM | POA: Diagnosis not present

## 2014-08-26 DIAGNOSIS — N1330 Unspecified hydronephrosis: Secondary | ICD-10-CM | POA: Diagnosis not present

## 2014-08-26 LAB — URINALYSIS, ROUTINE W REFLEX MICROSCOPIC
Bilirubin Urine: NEGATIVE
Glucose, UA: NEGATIVE mg/dL
Ketones, ur: NEGATIVE mg/dL
Nitrite: NEGATIVE
Protein, ur: NEGATIVE mg/dL
Specific Gravity, Urine: 1.013 (ref 1.005–1.030)
Urobilinogen, UA: 0.2 mg/dL (ref 0.0–1.0)
pH: 5 (ref 5.0–8.0)

## 2014-08-26 LAB — CBC WITH DIFFERENTIAL/PLATELET
Basophils Absolute: 0 K/uL (ref 0.0–0.1)
Basophils Relative: 0 % (ref 0–1)
Eosinophils Absolute: 0 K/uL (ref 0.0–0.7)
Eosinophils Relative: 0 % (ref 0–5)
HCT: 38.9 % (ref 36.0–46.0)
Hemoglobin: 12.5 g/dL (ref 12.0–15.0)
Lymphocytes Relative: 10 % — ABNORMAL LOW (ref 12–46)
Lymphs Abs: 1 K/uL (ref 0.7–4.0)
MCH: 25.7 pg — ABNORMAL LOW (ref 26.0–34.0)
MCHC: 32.1 g/dL (ref 30.0–36.0)
MCV: 80 fL (ref 78.0–100.0)
Monocytes Absolute: 0.7 K/uL (ref 0.1–1.0)
Monocytes Relative: 7 % (ref 3–12)
Neutro Abs: 7.8 K/uL — ABNORMAL HIGH (ref 1.7–7.7)
Neutrophils Relative %: 83 % — ABNORMAL HIGH (ref 43–77)
Platelets: 465 K/uL — ABNORMAL HIGH (ref 150–400)
RBC: 4.86 MIL/uL (ref 3.87–5.11)
RDW: 21.3 % — ABNORMAL HIGH (ref 11.5–15.5)
WBC: 9.5 K/uL (ref 4.0–10.5)

## 2014-08-26 LAB — URINE MICROSCOPIC-ADD ON

## 2014-08-26 LAB — COMPREHENSIVE METABOLIC PANEL WITH GFR
ALT: 10 U/L (ref 0–35)
AST: 30 U/L (ref 0–37)
Albumin: 3.9 g/dL (ref 3.5–5.2)
Alkaline Phosphatase: 64 U/L (ref 39–117)
Anion gap: 19 — ABNORMAL HIGH (ref 5–15)
BUN: 41 mg/dL — ABNORMAL HIGH (ref 6–23)
CO2: 21 meq/L (ref 19–32)
Calcium: 9.9 mg/dL (ref 8.4–10.5)
Chloride: 102 meq/L (ref 96–112)
Creatinine, Ser: 1.77 mg/dL — ABNORMAL HIGH (ref 0.50–1.10)
GFR calc Af Amer: 32 mL/min — ABNORMAL LOW
GFR calc non Af Amer: 28 mL/min — ABNORMAL LOW
Glucose, Bld: 139 mg/dL — ABNORMAL HIGH (ref 70–99)
Potassium: 4.3 meq/L (ref 3.7–5.3)
Sodium: 142 meq/L (ref 137–147)
Total Bilirubin: 0.4 mg/dL (ref 0.3–1.2)
Total Protein: 9 g/dL — ABNORMAL HIGH (ref 6.0–8.3)

## 2014-08-26 LAB — I-STAT CG4 LACTIC ACID, ED
Lactic Acid, Venous: 3.54 mmol/L — ABNORMAL HIGH (ref 0.5–2.2)
Lactic Acid, Venous: 1.26 mmol/L (ref 0.5–2.2)

## 2014-08-26 MED ORDER — SODIUM CHLORIDE 0.9 % IV BOLUS (SEPSIS)
1000.0000 mL | Freq: Once | INTRAVENOUS | Status: AC
  Administered 2014-08-26: 1000 mL via INTRAVENOUS

## 2014-08-26 MED ORDER — ONDANSETRON HCL 4 MG/2ML IJ SOLN
4.0000 mg | Freq: Once | INTRAMUSCULAR | Status: AC
  Administered 2014-08-26: 4 mg via INTRAVENOUS
  Filled 2014-08-26: qty 2

## 2014-08-26 MED ORDER — ACETAMINOPHEN 650 MG RE SUPP
650.0000 mg | Freq: Once | RECTAL | Status: AC
  Administered 2014-08-26: 650 mg via RECTAL
  Filled 2014-08-26: qty 1

## 2014-08-26 MED ORDER — DEXTROSE 5 % IV SOLN
1.0000 g | Freq: Once | INTRAVENOUS | Status: AC
  Administered 2014-08-26: 1 g via INTRAVENOUS
  Filled 2014-08-26: qty 10

## 2014-08-26 MED ORDER — SODIUM CHLORIDE 0.9 % IV SOLN: Freq: Once | INTRAVENOUS | Status: DC

## 2014-08-26 MED ORDER — ACETAMINOPHEN 325 MG PO TABS
650.0000 mg | ORAL_TABLET | Freq: Four times a day (QID) | ORAL | Status: DC | PRN
  Filled 2014-08-26: qty 2

## 2014-08-26 NOTE — ED Provider Notes (Signed)
CSN: [id number]     Arrival date & time 08/26/14  1940 History   First MD Initiated Contact with Patient 08/26/14 2005     Chief Complaint  Patient presents with  . flu like sxs   . Weakness     (Consider location/radiation/quality/duration/timing/severity/associated sxs/prior Treatment) HPI 73 year old female presents with vomiting and diarrhea that started yesterday. Has had generalized weakness and fatigue but no pain. Denies any fevers. No cough, shortness of breath, chest pain, or abdominal pain. No urinary symptoms. She has not been drinking as much. No blood in her stool. She feels like she is not hungry and is not eating all day.  Past Medical History  Diagnosis Date  . Coronary artery disease   . Hypertension    Past Surgical History  Procedure Laterality Date  . Coronary angioplasty with stent placement     Family History  Problem Relation Age of Onset  . Hypertension Mother   . Hypertension Father    History  Substance Use Topics  . Smoking status: Never Smoker   . Smokeless tobacco: Not on file  . Alcohol Use: No   OB History    No data available     Review of Systems  Constitutional: Negative for fever.  Respiratory: Negative for cough and shortness of breath.   Cardiovascular: Negative for chest pain.  Gastrointestinal: Positive for nausea, vomiting and diarrhea. Negative for abdominal pain.  Genitourinary: Negative for dysuria.  Neurological: Positive for weakness.  All other systems reviewed and are negative.     Allergies  Review of patient's allergies indicates no known allergies.  Home Medications   Prior to Admission medications   Medication Sig Start Date End Date Taking? Authorizing Provider  amLODipine (NORVASC) 10 MG tablet Take 1 tablet (10 mg total) by mouth daily. 04/16/14   Lance Bosch, NP  lisinopril (PRINIVIL,ZESTRIL) 5 MG tablet Take 1 tablet (5 mg total) by mouth daily. 04/16/14   Lance Bosch, NP   BP 135/68 mmHg  Pulse  122  Temp(Src) 98.5 F (36.9 C) (Oral)  Resp 18  SpO2 100% Physical Exam  Constitutional: She appears well-developed and well-nourished.  HENT:  Head: Normocephalic and atraumatic.  Right Ear: External ear normal.  Left Ear: External ear normal.  Nose: Nose normal.  Eyes: Right eye exhibits no discharge. Left eye exhibits no discharge.  Cardiovascular: Regular rhythm and normal heart sounds.  Tachycardia present.   Pulmonary/Chest: Effort normal and breath sounds normal. She has no wheezes. She has no rales.  Abdominal: Soft. She exhibits no distension. There is no tenderness.  Musculoskeletal: She exhibits no tenderness.  Neurological: She is alert.  Skin: Skin is warm and dry.  Nursing note and vitals reviewed.   ED Course  Procedures (including critical care time) Labs Review Labs Reviewed  CBC WITH DIFFERENTIAL - Abnormal; Notable for the following:    MCH 25.7 (*)    RDW 21.3 (*)    Platelets 465 (*)    Neutrophils Relative % 83 (*)    Lymphocytes Relative 10 (*)    Neutro Abs 7.8 (*)    All other components within normal limits  COMPREHENSIVE METABOLIC PANEL - Abnormal; Notable for the following:    Glucose, Bld 139 (*)    BUN 41 (*)    Creatinine, Ser 1.77 (*)    Total Protein 9.0 (*)    GFR calc non Af Amer 28 (*)    GFR calc Af Amer 32 (*)  Anion gap 19 (*)    All other components within normal limits  URINALYSIS, ROUTINE W REFLEX MICROSCOPIC - Abnormal; Notable for the following:    APPearance TURBID (*)    Hgb urine dipstick MODERATE (*)    Leukocytes, UA LARGE (*)    All other components within normal limits  URINE MICROSCOPIC-ADD ON - Abnormal; Notable for the following:    Squamous Epithelial / LPF FEW (*)    Bacteria, UA MANY (*)    All other components within normal limits  I-STAT CG4 LACTIC ACID, ED - Abnormal; Notable for the following:    Lactic Acid, Venous 3.54 (*)    All other components within normal limits  URINE CULTURE  CLOSTRIDIUM  DIFFICILE BY PCR  STOOL CULTURE  I-STAT CG4 LACTIC ACID, ED    Imaging Review Dg Chest Port 1 View  (if Code Sepsis Called)  08/26/2014   CLINICAL DATA:  Altered mental status  EXAM: PORTABLE CHEST - 1 VIEW  COMPARISON:  None.  FINDINGS: Moderate cardiomegaly. Normal vascularity. Left basilar atelectasis. No pneumothorax or pleural effusion.  IMPRESSION: Cardiomegaly without decompensation.   Electronically Signed   By: Maryclare Bean M.D.   On: 08/26/2014 21:04     EKG Interpretation None      MDM   Final diagnoses:  UTI (lower urinary tract infection)  SIRS (systemic inflammatory response syndrome)  Acute renal failure, unspecified acute renal failure type    Patient with acute dehydration from her vomiting and diarrhea. She is also febrile, and does have leukocyte esterase, the BB disease, and bacteria in her urine. We'll treat this is a UTI. Her abdominal exam shows no abdominal tenderness on multiple exams. Do not feel she is imaging at this time. No pneumonia on chest x-ray. Heart rate is improved with fluids, but given age and comorbidities will admit to the hospitalist for further management.    Ephraim Hamburger, MD 08/26/14 [phone]

## 2014-08-26 NOTE — H&P (Signed)
PCP:   Chari Manning, NP   Chief Complaint:  N/v/d  HPI: 73 yo female with one day of n/v/d nonbloody with fever started this am.  No abd pain or chest pain.  No sick contacts.  Has not been able to hold anything down all day.  Feels much better after 2 liters of ivf.  Denies any urinary symptoms.  No cough.  No swelling.  She is ready to go home.  No recent abx or hospitalization.  Review of Systems:  Positive and negative as per HPI otherwise all other systems are negative  Past Medical History: Past Medical History  Diagnosis Date  . Coronary artery disease   . Hypertension    Past Surgical History  Procedure Laterality Date  . Coronary angioplasty with stent placement      Medications: Prior to Admission medications   Medication Sig Start Date End Date Taking? Authorizing Provider  amLODipine (NORVASC) 10 MG tablet Take 1 tablet (10 mg total) by mouth daily. 04/16/14  Yes Lance Bosch, NP  lisinopril (PRINIVIL,ZESTRIL) 5 MG tablet Take 1 tablet (5 mg total) by mouth daily. 04/16/14  Yes Lance Bosch, NP    Allergies:  No Known Allergies  Social History:  reports that she has never smoked. She does not have any smokeless tobacco history on file. She reports that she does not drink alcohol or use illicit drugs.  Family History: Family History  Problem Relation Age of Onset  . Hypertension Mother   . Hypertension Father     Physical Exam: Filed Vitals:   08/26/14 1947 08/26/14 2023 08/26/14 2142  BP: 135/68  135/75  Pulse: 122  103  Temp: 98.5 F (36.9 C) 101.8 F (38.8 C) 99.3 F (37.4 C)  TempSrc: Oral Rectal Oral  Resp: 18  20  SpO2: 100%  95%   General appearance: alert, cooperative and no distress Head: Normocephalic, without obvious abnormality, atraumatic Eyes: negative Nose: Nares normal. Septum midline. Mucosa normal. No drainage or sinus tenderness. Neck: no JVD and supple, symmetrical, trachea midline Lungs: clear to auscultation  bilaterally Heart: regular rate and rhythm, S1, S2 normal, no murmur, click, rub or gallop Abdomen: soft, non-tender; bowel sounds normal; no masses,  no organomegaly Extremities: extremities normal, atraumatic, no cyanosis or edema Pulses: 2+ and symmetric Skin: Skin color, texture, turgor normal. No rashes or lesions Neurologic: Grossly normal    Labs on Admission:   Recent Labs  08/26/14 2016  NA 142  K 4.3  CL 102  CO2 21  GLUCOSE 139*  BUN 41*  CREATININE 1.77*  CALCIUM 9.9    Recent Labs  08/26/14 2016  AST 30  ALT 10  ALKPHOS 64  BILITOT 0.4  PROT 9.0*  ALBUMIN 3.9    Recent Labs  08/26/14 2016  WBC 9.5  NEUTROABS 7.8*  HGB 12.5  HCT 38.9  MCV 80.0  PLT 465*    Radiological Exams on Admission: Dg Chest Port 1 View  (if Code Sepsis Called)  08/26/2014   CLINICAL DATA:  Altered mental status  EXAM: PORTABLE CHEST - 1 VIEW  COMPARISON:  None.  FINDINGS: Moderate cardiomegaly. Normal vascularity. Left basilar atelectasis. No pneumothorax or pleural effusion.  IMPRESSION: Cardiomegaly without decompensation.   Electronically Signed   By: Maryclare Bean M.D.   On: 08/26/2014 21:04    Assessment/Plan  73 yo female with mild SIRS likely from urine  Principal Problem:   SIRS (systemic inflammatory response syndrome)-  Improved already with ivf  and feels better already.  Treat underlying uti.  Lactic acidosis improving.  Active Problems:   Chronic venous stasis dermatitis of both lower extremities   Coronary artery disease   UTI (lower urinary tract infection)-  rocephin   Acute renal failure prerenal.  Ivf.  Repeat in am.   Nausea and vomiting   Diarrhea-  Will ck stool culture and cdiff just in case, but likely viral gastroenteritis or may be related to her uti  Possible can d/c tomorrow afternoon.  Admit to tele.  Full code.  Osvaldo Lamping A 08/26/2014, 10:28 PM

## 2014-08-26 NOTE — ED Notes (Signed)
Family states pt is having flu like sxs that started today  Pt is having diarrhea and fever  Pt is not eating today and is having generalized weakness

## 2014-08-26 NOTE — ED Notes (Signed)
Pt unable to void at this time. 

## 2014-08-27 ENCOUNTER — Inpatient Hospital Stay (HOSPITAL_COMMUNITY): Payer: Medicare Other

## 2014-08-27 DIAGNOSIS — I10 Essential (primary) hypertension: Secondary | ICD-10-CM

## 2014-08-27 DIAGNOSIS — A419 Sepsis, unspecified organism: Secondary | ICD-10-CM

## 2014-08-27 DIAGNOSIS — N189 Chronic kidney disease, unspecified: Secondary | ICD-10-CM

## 2014-08-27 DIAGNOSIS — E43 Unspecified severe protein-calorie malnutrition: Secondary | ICD-10-CM

## 2014-08-27 DIAGNOSIS — N179 Acute kidney failure, unspecified: Secondary | ICD-10-CM

## 2014-08-27 LAB — BASIC METABOLIC PANEL
ANION GAP: 14 (ref 5–15)
BUN: 35 mg/dL — ABNORMAL HIGH (ref 6–23)
CHLORIDE: 106 meq/L (ref 96–112)
CO2: 22 meq/L (ref 19–32)
Calcium: 8.7 mg/dL (ref 8.4–10.5)
Creatinine, Ser: 1.7 mg/dL — ABNORMAL HIGH (ref 0.50–1.10)
GFR calc Af Amer: 34 mL/min — ABNORMAL LOW (ref >90)
GFR calc non Af Amer: 29 mL/min — ABNORMAL LOW (ref >90)
Glucose, Bld: 115 mg/dL — ABNORMAL HIGH (ref 70–99)
POTASSIUM: 4 meq/L (ref 3.7–5.3)
SODIUM: 142 meq/L (ref 137–147)

## 2014-08-27 LAB — CBC
HEMATOCRIT: 27.9 % — AB (ref 36.0–46.0)
HEMOGLOBIN: 8.8 g/dL — AB (ref 12.0–15.0)
MCH: 25.2 pg — ABNORMAL LOW (ref 26.0–34.0)
MCHC: 31.5 g/dL (ref 30.0–36.0)
MCV: 79.9 fL (ref 78.0–100.0)
Platelets: 514 10*3/uL — ABNORMAL HIGH (ref 150–400)
RBC: 3.49 MIL/uL — ABNORMAL LOW (ref 3.87–5.11)
RDW: 22 % — ABNORMAL HIGH (ref 11.5–15.5)
WBC: 8.4 10*3/uL (ref 4.0–10.5)

## 2014-08-27 LAB — URINE CULTURE: Colony Count: >=100000

## 2014-08-27 MED ORDER — SODIUM CHLORIDE 0.9 % IV SOLN
INTRAVENOUS | Status: DC
  Administered 2014-08-27 – 2014-08-29 (×3): via INTRAVENOUS

## 2014-08-27 MED ORDER — AMLODIPINE BESYLATE 10 MG PO TABS
10.0000 mg | ORAL_TABLET | Freq: Every day | ORAL | Status: DC
  Administered 2014-08-27 – 2014-08-29 (×3): 10 mg via ORAL
  Filled 2014-08-27 (×3): qty 1

## 2014-08-27 MED ORDER — ENSURE COMPLETE PO LIQD
237.0000 mL | Freq: Two times a day (BID) | ORAL | Status: DC
  Administered 2014-08-27 – 2014-08-29 (×4): 237 mL via ORAL

## 2014-08-27 MED ORDER — CEFTRIAXONE SODIUM IN DEXTROSE 20 MG/ML IV SOLN
1.0000 g | INTRAVENOUS | Status: DC
  Administered 2014-08-27 – 2014-08-28 (×2): 1 g via INTRAVENOUS
  Filled 2014-08-27 (×3): qty 50

## 2014-08-27 MED ORDER — ENOXAPARIN SODIUM 30 MG/0.3ML ~~LOC~~ SOLN
30.0000 mg | SUBCUTANEOUS | Status: DC
  Administered 2014-08-27 – 2014-08-29 (×3): 30 mg via SUBCUTANEOUS
  Filled 2014-08-27 (×3): qty 0.3

## 2014-08-27 MED ORDER — INFLUENZA VAC SPLIT QUAD 0.5 ML IM SUSY
0.5000 mL | PREFILLED_SYRINGE | INTRAMUSCULAR | Status: AC
  Administered 2014-08-28: 0.5 mL via INTRAMUSCULAR
  Filled 2014-08-27 (×2): qty 0.5

## 2014-08-27 MED ORDER — SODIUM CHLORIDE 0.9 % IV SOLN: 250.0000 mL | INTRAVENOUS | Status: DC | PRN

## 2014-08-27 MED ORDER — SODIUM CHLORIDE 0.9 % IJ SOLN
3.0000 mL | Freq: Two times a day (BID) | INTRAMUSCULAR | Status: DC
  Administered 2014-08-27: 3 mL via INTRAVENOUS

## 2014-08-27 MED ORDER — LISINOPRIL 5 MG PO TABS
5.0000 mg | ORAL_TABLET | Freq: Every day | ORAL | Status: DC
  Administered 2014-08-27: 5 mg via ORAL
  Filled 2014-08-27: qty 1

## 2014-08-27 MED ORDER — SODIUM CHLORIDE 0.9 % IJ SOLN: 3.0000 mL | INTRAMUSCULAR | Status: DC | PRN

## 2014-08-27 MED ORDER — ENOXAPARIN SODIUM 40 MG/0.4ML ~~LOC~~ SOLN
40.0000 mg | SUBCUTANEOUS | Status: DC
  Filled 2014-08-27: qty 0.4

## 2014-08-27 MED ORDER — ONDANSETRON HCL 4 MG/2ML IJ SOLN: 4.0000 mg | Freq: Four times a day (QID) | INTRAMUSCULAR | Status: DC | PRN

## 2014-08-27 MED ORDER — SODIUM CHLORIDE 0.9 % IJ SOLN
3.0000 mL | Freq: Two times a day (BID) | INTRAMUSCULAR | Status: DC
  Administered 2014-08-27 – 2014-08-28 (×2): 3 mL via INTRAVENOUS

## 2014-08-27 MED ORDER — ONDANSETRON HCL 4 MG PO TABS: 4.0000 mg | ORAL_TABLET | Freq: Four times a day (QID) | ORAL | Status: DC | PRN

## 2014-08-27 NOTE — Care Management Note (Addendum)
    Page 1 of 2   08/29/2014     3:07:03 PM CARE MANAGEMENT NOTE 08/29/2014  Patient:  Ashlee Mack, Ashlee Mack   Account Number:  [id number]  Date Initiated:  08/27/2014  Documentation initiated by:  Dessa Phi  Subjective/Objective Assessment:   73 y/o f admitted w/SIRS.     Action/Plan:   From home.   Anticipated DC Date:  08/29/2014   Anticipated DC Plan:  [place]  CM consult      Choice offered to / List presented to:  C-1 Patient   DME arranged  Vassie Moselle      DME agency  [place] arranged  HH-2 PT  HH-1 RN  [place].   Status of service:  Completed, signed off Medicare Important Message given?  YES (If response is "NO", the following Medicare IM given date fields will be blank) Date Medicare IM given:  08/29/2014 Medicare IM given by:  [facility] Date Additional Medicare IM given:   Additional Medicare IM given by:    Discharge Disposition:  [place]  Per UR Regulation:  Reviewed for med. necessity/level of care/duration of stay  If discussed at [place] of Stay Meetings, dates discussed:    Comments:  08/29/14 Dessa Phi RN BSN NCM [phone] [place] aware of d/c & [place] orders.[facility] dme rep Pura Spice delivered rw to rm.  08/27/14 Dessa Phi RN BSN NCM [phone] Would recommend PT cons.

## 2014-08-27 NOTE — Progress Notes (Signed)
PROGRESS NOTE  Ashlee Mack TGG:[id number] DOB: [birth year] DOA: 08/26/2014 PCP: Chari Manning, NP  Brief history 73 year old female with a history of coronary artery disease, hypertension, stasis dermatitis presents with one-day history of fever, vomiting, and diarrhea. The patient denies any chest pain, shortness breath, coughing, hemoptysis, abdominal pain, dysuria, hematuria, rashes, synovitis. She states that her granddaughter had a flulike illness approximately 2 weeks ago. The patient was found to have a temperature 101.30F with tachycardia and acute kidney injury. The patient was given 2 L normal saline in the emergency department and admitted for further workup. Assessment/Plan: Sepsis  -Appears to be urinary source -Unfortunately, blood cultures were not obtained in ED -blood cultures 2 sets today -Continue ceftriaxone pending culture data  -Influenza PCR  -C. difficile PCR  -Chest x-ray negative for infiltrates  pyuria -Suggestive of UTI -Patient has suprapubic tenderness -Continue ceftriaxone pending culture data Diarrhea -C. difficile PCR Acute on chronic renal failure (CKD 3) -Secondary to volume depletion -IV fluids -Renal ultrasound as the patient's serum creatinine did not show much improvement with 2 L normal saline -Baseline creatinine 1.1-1.2 Hypertension -Discontinue lisinopril in the setting of acute renal failure -Continue amlodipine Severe protein calorie malnutrition -Nutritional supplements Family Communication:   Husband updated at beside Disposition Plan:   Home/SNF pending PT eval   Antibiotics:  Ceftriaxone 08/26/14>>>       Procedures/Studies: Dg Chest Port 1 View  (if Code Sepsis Called)  08/26/2014   CLINICAL DATA:  Altered mental status  EXAM: PORTABLE CHEST - 1 VIEW  COMPARISON:  None.  FINDINGS: Moderate cardiomegaly. Normal vascularity. Left basilar atelectasis. No pneumothorax or pleural effusion.  IMPRESSION:  Cardiomegaly without decompensation.   Electronically Signed   By: Maryclare Bean M.D.   On: 08/26/2014 21:04         Subjective: Patient had 1 loose stool today. Her vomiting has improved. She denies any chest pain, shortness breath, coughing, hemoptysis, vomiting today. No abdominal pain or dysuria.   Objective: Filed Vitals:   08/27/14 0137 08/27/14 0612 08/27/14 1309 08/27/14 1544  BP:  131/70 106/55   Pulse:  86 87   Temp:  100.2 F (37.9 C) 100.7 F (38.2 C) 98.3 F (36.8 C)  TempSrc:  Oral Oral Oral  Resp:  18 16   Height: 5\' 7"  (1.702 m)     Weight: 46.902 kg (103 lb 6.4 oz)     SpO2:  98% 96%    No intake or output data in the 24 hours ending 08/27/14 1621 Weight change:  Exam:   General:  Pt is alert, follows commands appropriately, not in acute distress  HEENT: No icterus, No thrush, No meningismus, [place]/AT  Cardiovascular: RRR, S1/S2, no rubs, no gallops  Respiratory: CTA bilaterally, no wheezing, no crackles, no rhonchi  Abdomen: Soft/+BS, suprapubic tenderness without rebound, non distended, no guarding; No CVA tenderness  Extremities: 1+ edema, No lymphangitis, No petechiae, No rashes, no synovitis  Data Reviewed: Basic Metabolic Panel:  Recent Labs Lab 08/26/14 2016 08/27/14 0437  NA 142 142  K 4.3 4.0  CL 102 106  CO2 21 22  GLUCOSE 139* 115*  BUN 41* 35*  CREATININE 1.77* 1.70*  CALCIUM 9.9 8.7   Liver Function Tests:  Recent Labs Lab 08/26/14 2016  AST 30  ALT 10  ALKPHOS 64  BILITOT 0.4  PROT 9.0*  ALBUMIN 3.9   No results for input(s): LIPASE, AMYLASE in the last 168 hours.  No results for input(s): AMMONIA in the last 168 hours. CBC:  Recent Labs Lab 08/26/14 2016 08/27/14 0437  WBC 9.5 8.4  NEUTROABS 7.8*  --   HGB 12.5 8.8*  HCT 38.9 27.9*  MCV 80.0 79.9  PLT 465* 514*   Cardiac Enzymes: No results for input(s): CKTOTAL, CKMB, CKMBINDEX, TROPONINI in the last 168 hours. BNP: Invalid input(s): POCBNP CBG: No  results for input(s): GLUCAP in the last 168 hours.  No results found for this or any previous visit (from the past 240 hour(s)).   Scheduled Meds: . amLODipine  10 mg Oral Daily  . cefTRIAXone (ROCEPHIN)  IV  1 g Intravenous Q24H  . enoxaparin (LOVENOX) injection  30 mg Subcutaneous Q24H  . feeding supplement (ENSURE COMPLETE)  237 mL Oral BID BM  . [START ON 08/28/2014] Influenza vac split quadrivalent PF  0.5 mL Intramuscular Tomorrow-1000  . lisinopril  5 mg Oral Daily  . sodium chloride  3 mL Intravenous Q12H  . sodium chloride  3 mL Intravenous Q12H   Continuous Infusions: . sodium chloride       Dimitri Dsouza, DO  [facility] Pager [phone]  If 7PM-7AM, please contact night-coverage [email or web address] Password [email or web address] 08/27/2014, 4:21 PM   LOS: 1 day

## 2014-08-27 NOTE — Progress Notes (Signed)
INITIAL NUTRITION ASSESSMENT  DOCUMENTATION CODES Per approved criteria  -Severe malnutrition in the context of chronic illness -Underweight  Pt meets criteria for severe MALNUTRITION in the context of chronic illness as evidenced by severe and moderate muscle wasting/fat loss, PO intake <75% for > 3 months, 12% body weight loss in ~6 months.   INTERVENTION: -Recommend Ensure Complete po BID, each supplement provides 350 kcal and 13 grams of protein -Recommend assistance with discharge needs; pt may require higher level of care to meet estimated nutritional needs  -Encouraged fiber, fluids, physical activity for constipation prevention -RD to continue to monitor   NUTRITION DIAGNOSIS: Inadequate oral intake related to decreased appetite/constipation/nausea as evidenced by PO intake < 75%, wt loss.   Goal: Pt to meet >/= 90% of their estimated nutrition needs    Monitor:  Total protein/energy intake, labs, weights, GI profile  Reason for Assessment: MST/Underweight status  73 y.o. female  Admitting Dx: SIRS (systemic inflammatory response syndrome)  ASSESSMENT: 73 yo female with one day of n/v/d nonbloody with fever started this am. No abd pain or chest pain. No sick contacts. Has not been able to hold anything down all day.  -Pt's husband reported pt with poor nutritional intake pta. Will sometimes go entire day without meal. Drinks Ensure supplements occasionally if daughter buys them for her -Pt noted difficulty with constipation and occasional nausea that inhibit intake. Will take stool softener sometimes, however has minimal intake of fluids. Currently with diarrhea, C.diff results pending -Pt with poor dentition; per RN pt may have dentures at home; staff has been encouraging softer foods for tolerance.  -Current PO intake 0%, pt refused breakfast. RD assisted in meal set up for lunch. -Endorsed ongoing weight loss, approximately 5-10 lbs per husband. Unsure when wt  loss began, pt noted she has always been smaller framed.  -Previous RD assessment indicates pt w/Usual body weight 120 lb; wt loss/poor PO ongoing >6 months -Per discussion with RN, pt has assistance with care from family member who is also CNA; however it is unclear the extent of which family member is also to assist with care. Would likely benefit from CM/SW consult to assist with discharge needs -Pt with moderate muscle wasting in clavicle and dorsal hand region, and moderate fat loss in occipital region  Height: Ht Readings from Last 1 Encounters:  08/27/14 5\' 7"  (1.702 m)    Weight: Wt Readings from Last 1 Encounters:  08/27/14 103 lb 6.4 oz (46.902 kg)    Ideal Body Weight: 135 lb  % Ideal Body Weight: 76%  Wt Readings from Last 10 Encounters:  08/27/14 103 lb 6.4 oz (46.902 kg)  04/09/14 110 lb 12.8 oz (50.259 kg)  03/23/14 107 lb 2.3 oz (48.6 kg)    Usual Body Weight: 120 lb  % Usual Body Weight: 86%  BMI:  Body mass index is 16.19 kg/(m^2). Underweight  Estimated Nutritional Needs: Kcal: 1400-1600 Protein: 60-70 gram Fluid: >/= 1400 ml daily  Skin: WDL  Diet Order: Diet heart healthy/carb modified  EDUCATION NEEDS: -No education needs identified at this time  No intake or output data in the 24 hours ending 08/27/14 1338  Last BM: 12/14   Labs:   Recent Labs Lab 08/26/14 2016 08/27/14 0437  NA 142 142  K 4.3 4.0  CL 102 106  CO2 21 22  BUN 41* 35*  CREATININE 1.77* 1.70*  CALCIUM 9.9 8.7  GLUCOSE 139* 115*    CBG (last 3)  No results for  input(s): GLUCAP in the last 72 hours.  Scheduled Meds: . amLODipine  10 mg Oral Daily  . cefTRIAXone (ROCEPHIN)  IV  1 g Intravenous Q24H  . enoxaparin (LOVENOX) injection  30 mg Subcutaneous Q24H  . [START ON 08/28/2014] Influenza vac split quadrivalent PF  0.5 mL Intramuscular Tomorrow-1000  . lisinopril  5 mg Oral Daily  . sodium chloride  3 mL Intravenous Q12H  . sodium chloride  3 mL Intravenous  Q12H    Continuous Infusions:   Past Medical History  Diagnosis Date  . Coronary artery disease   . Hypertension     Past Surgical History  Procedure Laterality Date  . Coronary angioplasty with stent placement      Atlee Abide MS RD LDN Clinical Dietitian [phone]

## 2014-08-28 DIAGNOSIS — A419 Sepsis, unspecified organism: Secondary | ICD-10-CM | POA: Diagnosis not present

## 2014-08-28 DIAGNOSIS — I10 Essential (primary) hypertension: Secondary | ICD-10-CM

## 2014-08-28 LAB — CBC
HCT: 26.6 % — ABNORMAL LOW (ref 36.0–46.0)
HEMOGLOBIN: 8.4 g/dL — AB (ref 12.0–15.0)
MCH: 25.4 pg — AB (ref 26.0–34.0)
MCHC: 31.6 g/dL (ref 30.0–36.0)
MCV: 80.4 fL (ref 78.0–100.0)
PLATELETS: 411 10*3/uL — AB (ref 150–400)
RBC: 3.31 MIL/uL — AB (ref 3.87–5.11)
RDW: 21.8 % — ABNORMAL HIGH (ref 11.5–15.5)
WBC: 5.9 10*3/uL (ref 4.0–10.5)

## 2014-08-28 LAB — BASIC METABOLIC PANEL
ANION GAP: 14 (ref 5–15)
BUN: 28 mg/dL — ABNORMAL HIGH (ref 6–23)
CALCIUM: 8.5 mg/dL (ref 8.4–10.5)
CO2: 21 meq/L (ref 19–32)
Chloride: 108 mEq/L (ref 96–112)
Creatinine, Ser: 1.5 mg/dL — ABNORMAL HIGH (ref 0.50–1.10)
GFR calc Af Amer: 39 mL/min — ABNORMAL LOW (ref >90)
GFR, EST NON AFRICAN AMERICAN: 34 mL/min — AB (ref >90)
Glucose, Bld: 77 mg/dL (ref 70–99)
Potassium: 4 mEq/L (ref 3.7–5.3)
Sodium: 143 mEq/L (ref 137–147)

## 2014-08-28 LAB — INFLUENZA PANEL BY PCR (TYPE A & B)
H1N1 flu by pcr: NOT DETECTED
INFLAPCR: NEGATIVE
INFLBPCR: NEGATIVE

## 2014-08-28 NOTE — Progress Notes (Signed)
Pt son Jeannette Corpus is hoping for some HH for his mother because he thinks she needs help with some of her ADL's at home, mostly including bathing and toileting. He has lots of question for case management.

## 2014-08-28 NOTE — Evaluation (Signed)
Physical Therapy Evaluation Patient Details Name: Ashlee Mack MRN: [id number] DOB: Feb 24, 1941 Today's Date: 08/28/2014   History of Present Illness  73 year old female  adm 08/26/14 with  fever, vomiting, and diarrhea; PMHx:  coronary artery disease, hypertension, stasis dermatitis   Clinical Impression  Pt will benefit from PT to address deficits below; only husband present at time of eval, neither pt nor husband drive and it seems ?son and grand-dtr help out; Recommend HHPT, if slow progress may need SNF, pt has a flight of stairs to enter their apartment per husband. Will follow;     Follow Up Recommendations Home health PT (vs SNF if slow progress)    Equipment Recommendations  Rolling walker with 5" wheels    Recommendations for Other Services       Precautions / Restrictions Precautions Precautions: Fall      Mobility  Bed Mobility Overal bed mobility: Needs Assistance Bed Mobility: Supine to Sit     Supine to sit: Supervision     General bed mobility comments: incr time to complete  Transfers Overall transfer level: Needs assistance Equipment used: Rolling walker (2 wheeled) Transfers: Sit to/from Stand Sit to Stand: Min assist;Min guard         General transfer comment: repeated transfer for hygiene (pt incontinent of urine); cues for hand placement and overall safety;   Ambulation/Gait Ambulation/Gait assistance: Min assist;Min guard Ambulation Distance (Feet): 30 Feet Assistive device: Rolling walker (2 wheeled) Gait Pattern/deviations: Step-to pattern;Decreased step length - right;Decreased step length - left     General Gait Details: cues for RW position from self, posture, assist with maneuvering RW at times  Stairs            Wheelchair Mobility    Modified Rankin (Stroke Patients Only)       Balance Overall balance assessment: Needs assistance         Standing balance support: During functional activity;Bilateral upper  extremity supported;No upper extremity supported Standing balance-Leahy Scale: Poor Standing balance comment: needs support of RW to achieve safe standing and upright trunk                             Pertinent Vitals/Pain Pain Assessment: No/denies pain    Home Living Family/patient expects to be discharged to:: Private residence Living Arrangements: Spouse/significant other   Type of Home: Apartment Home Access: Stairs to enter   Technical brewer of Steps: 12 Home Layout: One level        Prior Function Level of Independence: Independent               Hand Dominance        Extremity/Trunk Assessment   Upper Extremity Assessment: Generalized weakness           Lower Extremity Assessment: Generalized weakness         Communication   Communication: No difficulties  Cognition Arousal/Alertness: Awake/alert Behavior During Therapy: WFL for tasks assessed/performed;Flat affect Overall Cognitive Status: Within Functional Limits for tasks assessed (no family to determine baseline, appears [facility] )                      General Comments      Exercises        Assessment/Plan    PT Assessment Patient needs continued PT services  PT Diagnosis Difficulty walking;Generalized weakness   PT Problem List Decreased strength;Decreased balance;Decreased activity tolerance;Decreased mobility  PT Treatment Interventions DME  instruction;Gait training;Functional mobility training;Stair training;Therapeutic activities;Therapeutic exercise;Balance training;Patient/family education   PT Goals (Current goals can be found in the Care Plan section) Acute Rehab PT Goals Patient Stated Goal: to get home, back to I PT Goal Formulation: With patient Time For Goal Achievement: 09/04/14 Potential to Achieve Goals: Good    Frequency Min 3X/week   Barriers to discharge        Co-evaluation               End of Session Equipment Utilized  During Treatment: Gait belt Activity Tolerance: Patient tolerated treatment well Patient left: in chair;with call bell/phone within reach;with chair alarm set;with family/visitor present           Time: 1246-1311 PT Time Calculation (min) (ACUTE ONLY): 25 min   Charges:   PT Evaluation $Initial PT Evaluation Tier I: 1 Procedure PT Treatments $Gait Training: 8-22 mins   PT G Codes:          Sebert Stollings 09-03-2014, 1:27 PM

## 2014-08-28 NOTE — Progress Notes (Signed)
PROGRESS NOTE  Ashlee Mack PYK:[id number] DOB: 1941-03-19 DOA: 08/26/2014 PCP: Chari Manning, NP  Brief history 73 year old female with a history of coronary artery disease, hypertension, stasis dermatitis presents with one-day history of fever, vomiting, and diarrhea. The patient denies any chest pain, shortness breath, coughing, hemoptysis, abdominal pain, dysuria, hematuria, rashes, synovitis. She states that her granddaughter had a flulike illness approximately 2 weeks ago. The patient was found to have a temperature 101.41F with tachycardia and acute kidney injury. The patient was given 2 L normal saline in the emergency department and admitted for further workup. Assessment/Plan: Sepsis  -Appears to be 2/2 UTI -Unfortunately, blood cultures were not obtained in ED -blood cultures 2 negative so far -Continue ceftriaxone pending culture data  -Influenza PCR neg -C. difficile PCR  -Chest x-ray negative for infiltrates   pyuria -Suggestive of UTI -Patient has suprapubic tenderness -Continue ceftriaxone pending culture data  Diarrhea -C. difficile PCR, has not been able to be collected  Acute on chronic renal failure (CKD stage 3 at baseline) -Secondary to volume depletion, UTI and continue use of lisinopril -continue IV fluids -Renal ultrasound w/o hydronephrosis -Baseline creatinine 1.1-1.2 -follow BMET in am -continue tx for UTI -Cr improving; now down to 1.5 (12/16)  Hypertension -Discontinue lisinopril in the setting of acute renal failure -Continue amlodipine -BP stable  Severe protein calorie malnutrition -Nutritional supplements provided -patient encourage to increase PO intake  Family Communication:   Husband updated at beside Disposition Plan:   To be determine    Antibiotics:  Ceftriaxone 08/26/14>>>   Procedures/Studies: US Renal  08/27/2014   CLINICAL DATA:  Acute on chronic renal failure  EXAM: RENAL/URINARY TRACT ULTRASOUND COMPLETE   COMPARISON:  None  FINDINGS: Right Kidney:  Length: 10.1 cm length. Normal cortical thickness. Increased cortical echogenicity. Mild prominence of the RIGHT renal pelvis. No gross hydronephrosis or shadowing calcification. Small hypoechoic lesion at upper pole question small cyst 9 x 9 x 10 mm.  Left Kidney:  Length: 9.6 cm. Normal cortical thickness. Increased cortical echogenicity. No mass, hydronephrosis or shadowing calcification.  Bladder:  Appears well distended without gross mass or wall thickening. Prevoid volume calculated at 489 mL. Postvoid imaging was not performed. BILATERAL ureteral jets noted.  IMPRESSION: Medical renal disease changes of both kidneys.  Small RIGHT renal cyst and minimal RIGHT hydronephrosis.   Electronically Signed   By: Lavonia Dana M.D.   On: 08/27/2014 18:36   Dg Chest Port 1 View  (if Code Sepsis Called)  08/26/2014   CLINICAL DATA:  Altered mental status  EXAM: PORTABLE CHEST - 1 VIEW  COMPARISON:  None.  FINDINGS: Moderate cardiomegaly. Normal vascularity. Left basilar atelectasis. No pneumothorax or pleural effusion.  IMPRESSION: Cardiomegaly without decompensation.   Electronically Signed   By: Maryclare Bean M.D.   On: 08/26/2014 21:04    Subjective: Patient had another episode of loose stool overnight; neg flu by PCR. Denies further nausea and/or vomiting. Still with low grade fever.  Objective: Filed Vitals:   08/27/14 2151 08/28/14 0634 08/28/14 1405 08/28/14 2028  BP: 115/66 125/66 124/62 113/70  Pulse: 73 63 79 68  Temp: 99 F (37.2 C) 98.2 F (36.8 C) 99.2 F (37.3 C) 99.3 F (37.4 C)  TempSrc: Oral Oral Oral Oral  Resp: 18 18 18 18   Height:      Weight:      SpO2: 100% 100% 97% 99%    Intake/Output Summary (Last 24 hours)  at 08/28/14 2143 Last data filed at 08/28/14 1844  Gross per 24 hour  Intake 1776.25 ml  Output      0 ml  Net 1776.25 ml   Weight change:  Exam:   General:  Pt is alert, follows commands appropriately, not in acute  distress; with low grade fever overnight. Feeling better overall.  HEENT: No icterus, No thrush, No meningismus, /AT  Cardiovascular: RRR, S1/S2, no rubs, no gallops  Respiratory: CTA bilaterally, no wheezing, no crackles, no rhonchi  Abdomen: Soft/+BS, suprapubic tenderness without rebound, non distended, no guarding; No CVA tenderness  Extremities: 1+ edema, No lymphangitis, No petechiae, No rashes, no synovitis  Data Reviewed: Basic Metabolic Panel:  Recent Labs Lab 08/26/14 2016 08/27/14 0437 08/28/14 0501  NA 142 142 143  K 4.3 4.0 4.0  CL 102 106 108  CO2 21 22 21   GLUCOSE 139* 115* 77  BUN 41* 35* 28*  CREATININE 1.77* 1.70* 1.50*  CALCIUM 9.9 8.7 8.5   Liver Function Tests:  Recent Labs Lab 08/26/14 2016  AST 30  ALT 10  ALKPHOS 64  BILITOT 0.4  PROT 9.0*  ALBUMIN 3.9   CBC:  Recent Labs Lab 08/26/14 2016 08/27/14 0437 08/28/14 0501  WBC 9.5 8.4 5.9  NEUTROABS 7.8*  --   --   HGB 12.5 8.8* 8.4*  HCT 38.9 27.9* 26.6*  MCV 80.0 79.9 80.4  PLT 465* 514* 411*   Cardiac Enzymes: No results for input(s): CKTOTAL, CKMB, CKMBINDEX, TROPONINI in the last 168 hours. BNP: Invalid input(s): POCBNP CBG: No results for input(s): GLUCAP in the last 168 hours.  Recent Results (from the past 240 hour(s))  Urine culture     Status: None   Collection Time: 08/26/14  9:33 PM  Result Value Ref Range Status   Specimen Description URINE, RANDOM  Final   Special Requests NONE  Final   Culture  Setup Time   Final    08/27/2014 01:24 Performed at [place]   Final    >=100,000 COLONIES/ML Performed at [facility]    Culture   Final    Multiple bacterial morphotypes present, none predominant. Suggest appropriate recollection if clinically indicated. Performed at [facility]    Report Status 08/27/2014 FINAL  Final  Culture, blood (routine x 2)     Status: None (Preliminary result)   Collection Time: 08/27/14   5:10 PM  Result Value Ref Range Status   Specimen Description BLOOD RIGHT ARM  Final   Special Requests BOTTLES DRAWN AEROBIC ONLY 4CC  Final   Culture  Setup Time   Final    08/27/2014 22:21 Performed at [facility]    Culture   Final           BLOOD CULTURE RECEIVED NO GROWTH TO DATE CULTURE WILL BE HELD FOR 5 DAYS BEFORE ISSUING A FINAL NEGATIVE REPORT Performed at [facility]    Report Status PENDING  Incomplete  Culture, blood (routine x 2)     Status: None (Preliminary result)   Collection Time: 08/27/14  5:20 PM  Result Value Ref Range Status   Specimen Description BLOOD RIGHT ARM  Final   Special Requests BOTTLES DRAWN AEROBIC ONLY 10CC  Final   Culture  Setup Time   Final    08/27/2014 22:21 Performed at [facility]    Culture   Final           BLOOD CULTURE  RECEIVED NO GROWTH TO DATE CULTURE WILL BE HELD FOR 5 DAYS BEFORE ISSUING A FINAL NEGATIVE REPORT Performed at [facility]    Report Status PENDING  Incomplete     Scheduled Meds: . amLODipine  10 mg Oral Daily  . cefTRIAXone (ROCEPHIN)  IV  1 g Intravenous Q24H  . enoxaparin (LOVENOX) injection  30 mg Subcutaneous Q24H  . feeding supplement (ENSURE COMPLETE)  237 mL Oral BID BM  . sodium chloride  3 mL Intravenous Q12H  . sodium chloride  3 mL Intravenous Q12H   Continuous Infusions: . sodium chloride 75 mL/hr at 08/28/14 1831     Barton Dubois, MD [facility] Pager [phone]  If 7PM-7AM, please contact night-coverage [email or web address] Password [email or web address] 08/28/2014, 9:43 PM   LOS: 2 days

## 2014-08-29 DIAGNOSIS — N179 Acute kidney failure, unspecified: Secondary | ICD-10-CM | POA: Insufficient documentation

## 2014-08-29 LAB — BASIC METABOLIC PANEL
ANION GAP: 13 (ref 5–15)
BUN: 23 mg/dL (ref 6–23)
CALCIUM: 8.6 mg/dL (ref 8.4–10.5)
CO2: 19 mEq/L (ref 19–32)
Chloride: 105 mEq/L (ref 96–112)
Creatinine, Ser: 1.41 mg/dL — ABNORMAL HIGH (ref 0.50–1.10)
GFR calc non Af Amer: 36 mL/min — ABNORMAL LOW (ref >90)
GFR, EST AFRICAN AMERICAN: 42 mL/min — AB (ref >90)
Glucose, Bld: 85 mg/dL (ref 70–99)
POTASSIUM: 3.9 meq/L (ref 3.7–5.3)
Sodium: 137 mEq/L (ref 137–147)

## 2014-08-29 MED ORDER — CEFUROXIME AXETIL 500 MG PO TABS: 500.0000 mg | ORAL_TABLET | Freq: Two times a day (BID) | ORAL | Status: DC

## 2014-08-29 MED ORDER — ENSURE COMPLETE PO LIQD: 237.0000 mL | Freq: Two times a day (BID) | ORAL | Status: DC

## 2014-08-29 NOTE — Discharge Summary (Signed)
Physician Discharge Summary  Ashlee Mack DZH:[id number] DOB: [birth year] DOA: 08/26/2014  PCP: Chari Manning, NP  Admit date: 08/26/2014 Discharge date: 08/29/2014  Time spent: >30 minutes  Recommendations for Outpatient Follow-up:  Check BMET to follow electrolytes and renal function Reassess BP and adjust antihypertensive regimen as needed  Discharge Diagnoses:  Principal Problem:   SIRS (systemic inflammatory response syndrome) Active Problems:   Chronic venous stasis dermatitis of both lower extremities   Protein-calorie malnutrition, severe   Coronary artery disease   UTI (lower urinary tract infection)   Acute renal failure   Nausea and vomiting   Diarrhea   Acute on chronic renal failure   Sepsis   Essential hypertension   Discharge Condition: stable and improved. Will discharge home with [facility] services. Follow up with PCP in 2 weeks  Diet recommendation: heart healthy diet  Filed Weights   08/27/14 0137  Weight: 46.902 kg (103 lb 6.4 oz)    History of present illness:  73 yo female with one day of n/v/d nonbloody with fever started this am. No abd pain or chest pain. No sick contacts. Has not been able to hold anything down all day. Feels much better after 2 liters of ivf. Denies any urinary symptoms. No cough. No swelling. She is ready to go home. No recent abx or hospitalization.  Hospital Course:  Sepsis  -Appears to be 2/2 UTI -blood cultures 2 negative so far -Continue treatment with ceftin as mentioned below for UTI.  -Influenza PCR neg -C. difficile PCR never collected as diarrhea/BM gone for 48 hours now. -Chest x-ray negative for infiltrates   Pyuria/UTI -no specific microorganism isolated; but abx's initiated before cx taken -Patient has suprapubic tenderness on admission; resolved at discharge -will discharge with 7 more days of ceftin to complete a total of 10 days of antibiotics -instructed to keep herself well hydrated    Diarrhea -C. difficile PCR never collected as patient has not have further episodes of diarrhea -no dyspepsia symptoms and normal WBC's -probably due to viral gastroenteritis   Acute on chronic renal failure (CKD stage 3 at baseline) -Secondary to volume depletion, UTI and continue use of lisinopril -responded well to IV fluids -Renal ultrasound w/o hydronephrosis -Baseline creatinine 1.1-1.2 -follow BMET in 2 weeks -continue tx for UTI -Cr improving; now down to 1.4 at discharge  Hypertension -Discontinue lisinopril in the setting of acute renal failure -Continue amlodipine -BP has remained stable with just one antihypertensive agent -follow up with PCP in 2 weeks -advise to follow low sodium diet  Severe protein calorie malnutrition -Nutritional supplements provided -patient encourage to increase PO intake  Physical deconditioning/weakness: will discharge home with [facility] services  Procedures:  See below for x-ray reports   Consultations:  None   Discharge Exam: Filed Vitals:   08/29/14 0730  BP:   Pulse:   Temp: 98.3 F (36.8 C)  Resp:     General: Pt is alert, follows commands appropriately, not in acute distress; no fever in last 36 hours. Feeling better overall and wants to go home.  HEENT: No icterus, No thrush, No meningismus, [place]/AT  Cardiovascular: RRR, S1/S2, no rubs, no gallops  Respiratory: CTA bilaterally, no wheezing, no crackles, no rhonchi  Abdomen: Soft/+BS, suprapubic tenderness without rebound, non distended, no guarding; No CVA tenderness  Extremities: 1+ edema, No lymphangitis, No petechiae, No rashes, no synovitis   Discharge Instructions You were cared for by a hospitalist during your hospital stay. If you have any questions about your discharge  medications or the care you received while you were in the hospital after you are discharged, you can call the unit and asked to speak with the hospitalist on call if the hospitalist that took  care of you is not available. Once you are discharged, your primary care physician will handle any further medical issues. Please note that NO REFILLS for any discharge medications will be authorized once you are discharged, as it is imperative that you return to your primary care physician (or establish a relationship with a primary care physician if you do not have one) for your aftercare needs so that they can reassess your need for medications and monitor your lab values.  Discharge Instructions    Diet - low sodium heart healthy    Complete by:  As directed      Discharge instructions    Complete by:  As directed   Take medications as prescribed Arranged follow up with PCP in 2 weeks Keep yourself well hydrated Your lisinopril has been stopped due to recent decline in renal failure; please do not take it until follow up with PCP Follow a low sodium/heart healthy diet          Current Discharge Medication List    START taking these medications   Details  cefUROXime (CEFTIN) 500 MG tablet Take 1 tablet (500 mg total) by mouth 2 (two) times daily with a meal. Qty: 14 tablet, Refills: 0    feeding supplement, ENSURE COMPLETE, (ENSURE COMPLETE) LIQD Take 237 mLs by mouth 2 (two) times daily between meals.      CONTINUE these medications which have NOT CHANGED   Details  amLODipine (NORVASC) 10 MG tablet Take 1 tablet (10 mg total) by mouth daily. Qty: 90 tablet, Refills: 2      STOP taking these medications     lisinopril (PRINIVIL,ZESTRIL) 5 MG tablet        No Known Allergies Follow-up Information    Follow up with Chari Manning, NP. Schedule an appointment as soon as possible for a visit in 2 weeks.   Specialty:  Internal Medicine   Contact information:   [place] [address] [phone]       The results of significant diagnostics from this hospitalization (including imaging, microbiology, ancillary and laboratory) are listed below for  reference.    Significant Diagnostic Studies: US Renal  08/27/2014   CLINICAL DATA:  Acute on chronic renal failure  EXAM: RENAL/URINARY TRACT ULTRASOUND COMPLETE  COMPARISON:  None  FINDINGS: Right Kidney:  Length: 10.1 cm length. Normal cortical thickness. Increased cortical echogenicity. Mild prominence of the RIGHT renal pelvis. No gross hydronephrosis or shadowing calcification. Small hypoechoic lesion at upper pole question small cyst 9 x 9 x 10 mm.  Left Kidney:  Length: 9.6 cm. Normal cortical thickness. Increased cortical echogenicity. No mass, hydronephrosis or shadowing calcification.  Bladder:  Appears well distended without gross mass or wall thickening. Prevoid volume calculated at 489 mL. Postvoid imaging was not performed. BILATERAL ureteral jets noted.  IMPRESSION: Medical renal disease changes of both kidneys.  Small RIGHT renal cyst and minimal RIGHT hydronephrosis.   Electronically Signed   By: Lavonia Dana M.D.   On: 08/27/2014 18:36   Dg Chest Port 1 View  (if Code Sepsis Called)  08/26/2014   CLINICAL DATA:  Altered mental status  EXAM: PORTABLE CHEST - 1 VIEW  COMPARISON:  None.  FINDINGS: Moderate cardiomegaly. Normal vascularity. Left basilar atelectasis. No pneumothorax or pleural  effusion.  IMPRESSION: Cardiomegaly without decompensation.   Electronically Signed   By: Maryclare Bean M.D.   On: 08/26/2014 21:04    Microbiology: Recent Results (from the past 240 hour(s))  Urine culture     Status: None   Collection Time: 08/26/14  9:33 PM  Result Value Ref Range Status   Specimen Description URINE, RANDOM  Final   Special Requests NONE  Final   Culture  Setup Time   Final    08/27/2014 01:24 Performed at [place]   Final    >=100,000 COLONIES/ML Performed at [facility]    Culture   Final    Multiple bacterial morphotypes present, none predominant. Suggest appropriate recollection if clinically indicated. Performed at [facility]    Report Status 08/27/2014 FINAL  Final  Culture, blood (routine x 2)     Status: None (Preliminary result)   Collection Time: 08/27/14  5:10 PM  Result Value Ref Range Status   Specimen Description BLOOD RIGHT ARM  Final   Special Requests BOTTLES DRAWN AEROBIC ONLY 4CC  Final   Culture  Setup Time   Final    08/27/2014 22:21 Performed at [facility]    Culture   Final           BLOOD CULTURE RECEIVED NO GROWTH TO DATE CULTURE WILL BE HELD FOR 5 DAYS BEFORE ISSUING A FINAL NEGATIVE REPORT Performed at [facility]    Report Status PENDING  Incomplete  Culture, blood (routine x 2)     Status: None (Preliminary result)   Collection Time: 08/27/14  5:20 PM  Result Value Ref Range Status   Specimen Description BLOOD RIGHT ARM  Final   Special Requests BOTTLES DRAWN AEROBIC ONLY 10CC  Final   Culture  Setup Time   Final    08/27/2014 22:21 Performed at [facility]    Culture   Final           BLOOD CULTURE RECEIVED NO GROWTH TO DATE CULTURE WILL BE HELD FOR 5 DAYS BEFORE ISSUING A FINAL NEGATIVE REPORT Performed at [facility]    Report Status PENDING  Incomplete     Labs: Basic Metabolic Panel:  Recent Labs Lab 08/26/14 2016 08/27/14 0437 08/28/14 0501 08/29/14 0500  NA 142 142 143 137  K 4.3 4.0 4.0 3.9  CL 102 106 108 105  CO2 21 22 21 19   GLUCOSE 139* 115* 77 85  BUN 41* 35* 28* 23  CREATININE 1.77* 1.70* 1.50* 1.41*  CALCIUM 9.9 8.7 8.5 8.6   Liver Function Tests:  Recent Labs Lab 08/26/14 2016  AST 30  ALT 10  ALKPHOS 64  BILITOT 0.4  PROT 9.0*  ALBUMIN 3.9   CBC:  Recent Labs Lab 08/26/14 2016 08/27/14 0437 08/28/14 0501  WBC 9.5 8.4 5.9  NEUTROABS 7.8*  --   --   HGB 12.5 8.8* 8.4*  HCT 38.9 27.9* 26.6*  MCV 80.0 79.9 80.4  PLT 465* 514* 411*    Signed:  Barton Dubois  [facility] 08/29/2014, 11:25 AM

## 2014-08-29 NOTE — Progress Notes (Signed)
[facility]  [facility] is providing the following services: RW  If patient discharges after hours, please call [phone].   Linward Headland 08/29/2014, 11:25 AM

## 2014-08-31 DIAGNOSIS — I2510 Atherosclerotic heart disease of native coronary artery without angina pectoris: Secondary | ICD-10-CM | POA: Diagnosis not present

## 2014-08-31 DIAGNOSIS — I129 Hypertensive chronic kidney disease with stage 1 through stage 4 chronic kidney disease, or unspecified chronic kidney disease: Secondary | ICD-10-CM | POA: Diagnosis not present

## 2014-08-31 DIAGNOSIS — R6510 Systemic inflammatory response syndrome (SIRS) of non-infectious origin without acute organ dysfunction: Secondary | ICD-10-CM | POA: Diagnosis not present

## 2014-08-31 DIAGNOSIS — E43 Unspecified severe protein-calorie malnutrition: Secondary | ICD-10-CM | POA: Diagnosis not present

## 2014-08-31 DIAGNOSIS — A419 Sepsis, unspecified organism: Secondary | ICD-10-CM | POA: Diagnosis not present

## 2014-08-31 DIAGNOSIS — N189 Chronic kidney disease, unspecified: Secondary | ICD-10-CM | POA: Diagnosis not present

## 2014-08-31 DIAGNOSIS — N390 Urinary tract infection, site not specified: Secondary | ICD-10-CM | POA: Diagnosis not present

## 2014-09-02 LAB — CULTURE, BLOOD (ROUTINE X 2)
CULTURE: NO GROWTH
CULTURE: NO GROWTH

## 2014-09-03 DIAGNOSIS — N390 Urinary tract infection, site not specified: Secondary | ICD-10-CM | POA: Diagnosis not present

## 2014-09-03 DIAGNOSIS — I129 Hypertensive chronic kidney disease with stage 1 through stage 4 chronic kidney disease, or unspecified chronic kidney disease: Secondary | ICD-10-CM | POA: Diagnosis not present

## 2014-09-03 DIAGNOSIS — N189 Chronic kidney disease, unspecified: Secondary | ICD-10-CM | POA: Diagnosis not present

## 2014-09-03 DIAGNOSIS — I2510 Atherosclerotic heart disease of native coronary artery without angina pectoris: Secondary | ICD-10-CM | POA: Diagnosis not present

## 2014-09-03 DIAGNOSIS — A419 Sepsis, unspecified organism: Secondary | ICD-10-CM | POA: Diagnosis not present

## 2014-09-03 DIAGNOSIS — R6510 Systemic inflammatory response syndrome (SIRS) of non-infectious origin without acute organ dysfunction: Secondary | ICD-10-CM | POA: Diagnosis not present

## 2014-09-04 DIAGNOSIS — N189 Chronic kidney disease, unspecified: Secondary | ICD-10-CM | POA: Diagnosis not present

## 2014-09-04 DIAGNOSIS — A419 Sepsis, unspecified organism: Secondary | ICD-10-CM | POA: Diagnosis not present

## 2014-09-04 DIAGNOSIS — I2510 Atherosclerotic heart disease of native coronary artery without angina pectoris: Secondary | ICD-10-CM | POA: Diagnosis not present

## 2014-09-04 DIAGNOSIS — N390 Urinary tract infection, site not specified: Secondary | ICD-10-CM | POA: Diagnosis not present

## 2014-09-04 DIAGNOSIS — R6510 Systemic inflammatory response syndrome (SIRS) of non-infectious origin without acute organ dysfunction: Secondary | ICD-10-CM | POA: Diagnosis not present

## 2014-09-04 DIAGNOSIS — I129 Hypertensive chronic kidney disease with stage 1 through stage 4 chronic kidney disease, or unspecified chronic kidney disease: Secondary | ICD-10-CM | POA: Diagnosis not present

## 2014-09-05 DIAGNOSIS — N390 Urinary tract infection, site not specified: Secondary | ICD-10-CM | POA: Diagnosis not present

## 2014-09-05 DIAGNOSIS — R6510 Systemic inflammatory response syndrome (SIRS) of non-infectious origin without acute organ dysfunction: Secondary | ICD-10-CM | POA: Diagnosis not present

## 2014-09-05 DIAGNOSIS — I129 Hypertensive chronic kidney disease with stage 1 through stage 4 chronic kidney disease, or unspecified chronic kidney disease: Secondary | ICD-10-CM | POA: Diagnosis not present

## 2014-09-05 DIAGNOSIS — A419 Sepsis, unspecified organism: Secondary | ICD-10-CM | POA: Diagnosis not present

## 2014-09-05 DIAGNOSIS — I2510 Atherosclerotic heart disease of native coronary artery without angina pectoris: Secondary | ICD-10-CM | POA: Diagnosis not present

## 2014-09-05 DIAGNOSIS — N189 Chronic kidney disease, unspecified: Secondary | ICD-10-CM | POA: Diagnosis not present

## 2014-09-09 DIAGNOSIS — N390 Urinary tract infection, site not specified: Secondary | ICD-10-CM | POA: Diagnosis not present

## 2014-09-09 DIAGNOSIS — A419 Sepsis, unspecified organism: Secondary | ICD-10-CM | POA: Diagnosis not present

## 2014-09-09 DIAGNOSIS — I129 Hypertensive chronic kidney disease with stage 1 through stage 4 chronic kidney disease, or unspecified chronic kidney disease: Secondary | ICD-10-CM | POA: Diagnosis not present

## 2014-09-09 DIAGNOSIS — R6510 Systemic inflammatory response syndrome (SIRS) of non-infectious origin without acute organ dysfunction: Secondary | ICD-10-CM | POA: Diagnosis not present

## 2014-09-09 DIAGNOSIS — I2510 Atherosclerotic heart disease of native coronary artery without angina pectoris: Secondary | ICD-10-CM | POA: Diagnosis not present

## 2014-09-09 DIAGNOSIS — N189 Chronic kidney disease, unspecified: Secondary | ICD-10-CM | POA: Diagnosis not present

## 2014-09-10 DIAGNOSIS — N390 Urinary tract infection, site not specified: Secondary | ICD-10-CM | POA: Diagnosis not present

## 2014-09-10 DIAGNOSIS — I2510 Atherosclerotic heart disease of native coronary artery without angina pectoris: Secondary | ICD-10-CM | POA: Diagnosis not present

## 2014-09-10 DIAGNOSIS — R6510 Systemic inflammatory response syndrome (SIRS) of non-infectious origin without acute organ dysfunction: Secondary | ICD-10-CM | POA: Diagnosis not present

## 2014-09-10 DIAGNOSIS — N189 Chronic kidney disease, unspecified: Secondary | ICD-10-CM | POA: Diagnosis not present

## 2014-09-10 DIAGNOSIS — A419 Sepsis, unspecified organism: Secondary | ICD-10-CM | POA: Diagnosis not present

## 2014-09-10 DIAGNOSIS — I129 Hypertensive chronic kidney disease with stage 1 through stage 4 chronic kidney disease, or unspecified chronic kidney disease: Secondary | ICD-10-CM | POA: Diagnosis not present

## 2014-09-11 ENCOUNTER — Ambulatory Visit: Payer: Medicare Other | Attending: Internal Medicine | Admitting: Internal Medicine

## 2014-09-11 ENCOUNTER — Encounter: Payer: Self-pay | Admitting: Internal Medicine

## 2014-09-11 VITALS — BP 138/81 | HR 94 | Temp 98.8°F | Resp 16 | Wt 104.0 lb

## 2014-09-11 DIAGNOSIS — I2510 Atherosclerotic heart disease of native coronary artery without angina pectoris: Secondary | ICD-10-CM | POA: Diagnosis not present

## 2014-09-11 DIAGNOSIS — N3000 Acute cystitis without hematuria: Secondary | ICD-10-CM

## 2014-09-11 DIAGNOSIS — I10 Essential (primary) hypertension: Secondary | ICD-10-CM | POA: Insufficient documentation

## 2014-09-11 DIAGNOSIS — E785 Hyperlipidemia, unspecified: Secondary | ICD-10-CM | POA: Diagnosis not present

## 2014-09-11 DIAGNOSIS — N390 Urinary tract infection, site not specified: Secondary | ICD-10-CM | POA: Diagnosis not present

## 2014-09-11 DIAGNOSIS — Z79899 Other long term (current) drug therapy: Secondary | ICD-10-CM | POA: Diagnosis not present

## 2014-09-11 DIAGNOSIS — A419 Sepsis, unspecified organism: Secondary | ICD-10-CM | POA: Diagnosis not present

## 2014-09-11 DIAGNOSIS — N189 Chronic kidney disease, unspecified: Secondary | ICD-10-CM | POA: Diagnosis not present

## 2014-09-11 DIAGNOSIS — R6510 Systemic inflammatory response syndrome (SIRS) of non-infectious origin without acute organ dysfunction: Secondary | ICD-10-CM | POA: Diagnosis not present

## 2014-09-11 DIAGNOSIS — I129 Hypertensive chronic kidney disease with stage 1 through stage 4 chronic kidney disease, or unspecified chronic kidney disease: Secondary | ICD-10-CM | POA: Diagnosis not present

## 2014-09-11 LAB — POCT URINALYSIS DIPSTICK
Bilirubin, UA: NEGATIVE
Glucose, UA: NEGATIVE
Ketones, UA: NEGATIVE
Nitrite, UA: NEGATIVE
PROTEIN UA: 30
RBC UA: NEGATIVE
SPEC GRAV UA: 1.015
UROBILINOGEN UA: 0.2
pH, UA: 5.5

## 2014-09-11 LAB — BASIC METABOLIC PANEL
BUN: 17 mg/dL (ref 6–23)
CHLORIDE: 100 meq/L (ref 96–112)
CO2: 27 mEq/L (ref 19–32)
Calcium: 9.2 mg/dL (ref 8.4–10.5)
Creat: 1.19 mg/dL — ABNORMAL HIGH (ref 0.50–1.10)
Glucose, Bld: 102 mg/dL — ABNORMAL HIGH (ref 70–99)
Potassium: 4.4 mEq/L (ref 3.5–5.3)
Sodium: 136 mEq/L (ref 135–145)

## 2014-09-11 LAB — LIPID PANEL
CHOL/HDL RATIO: 4.1 ratio
Cholesterol: 199 mg/dL (ref 0–200)
HDL: 49 mg/dL (ref >39)
LDL Cholesterol: 137 mg/dL — ABNORMAL HIGH (ref 0–99)
Triglycerides: 67 mg/dL (ref <150)
VLDL: 13 mg/dL (ref 0–40)

## 2014-09-11 NOTE — Progress Notes (Signed)
Patient ID: Ashlee Mack, female   DOB: [birth year], 73 y.o.   MRN: [id number]  CC: HFU  HPI: Ashlee Mack is a 73 y.o. female here today for a follow up visit.  Patient has past medical history of hypertension and CAD.  Patient was admitted into the hospital on 12/14 for SIRS and UTI.  She was given ceftin PO and IV antibiotics.  She has completed all antibiotics and reports that she feels better. Her family reports that she has a very poor appetite and only consumes two bites of every meal. The patient states that she believes the medication is affecting her appetite.  She denies dysuria, flank pain, fevers, or chills.     No Known Allergies Past Medical History  Diagnosis Date  . Coronary artery disease   . Hypertension    Current Outpatient Prescriptions on File Prior to Visit  Medication Sig Dispense Refill  . amLODipine (NORVASC) 10 MG tablet Take 1 tablet (10 mg total) by mouth daily. 90 tablet 2  . cefUROXime (CEFTIN) 500 MG tablet Take 1 tablet (500 mg total) by mouth 2 (two) times daily with a meal. 14 tablet 0  . feeding supplement, ENSURE COMPLETE, (ENSURE COMPLETE) LIQD Take 237 mLs by mouth 2 (two) times daily between meals.     No current facility-administered medications on file prior to visit.   Family History  Problem Relation Age of Onset  . Hypertension Mother   . Hypertension Father    History   Social History  . Marital Status: Married    Spouse Name: N/A    Number of Children: N/A  . Years of Education: N/A   Occupational History  . Not on file.   Social History Main Topics  . Smoking status: Never Smoker   . Smokeless tobacco: Not on file  . Alcohol Use: No  . Drug Use: No  . Sexual Activity: Not on file   Other Topics Concern  . Not on file   Social History Narrative    Review of Systems  Constitutional: Positive for weight loss. Negative for fever and chills.  Genitourinary: Negative.   Neurological: Negative for weakness.  All other  systems reviewed and are negative. '    Objective:   Filed Vitals:   09/11/14 1527  BP: 138/81  Pulse: 94  Temp: 98.8 F (37.1 C)  Resp: 16    Physical Exam  Constitutional: She appears well-developed.  Cardiovascular: Normal rate, regular rhythm and normal heart sounds.   Pulmonary/Chest: Effort normal and breath sounds normal.  Abdominal: There is no tenderness.  Musculoskeletal: She exhibits no tenderness.  Neurological: She is alert.  Skin: Skin is warm and dry.     Lab Results  Component Value Date   WBC 5.9 08/28/2014   HGB 8.4* 08/28/2014   HCT 26.6* 08/28/2014   MCV 80.4 08/28/2014   PLT 411* 08/28/2014   Lab Results  Component Value Date   CREATININE 1.41* 08/29/2014   BUN 23 08/29/2014   NA 137 08/29/2014   K 3.9 08/29/2014   CL 105 08/29/2014   CO2 19 08/29/2014    Lab Results  Component Value Date   HGBA1C 6.0* 04/09/2014   Lipid Panel  No results found for: CHOL, TRIG, HDL, CHOLHDL, VLDL, LDLCALC     Assessment and plan:   Ashlee Mack was seen today for follow-up.  Diagnoses and associated orders for this visit:  Acute cystitis without hematuria - Urinalysis Dipstick - Basic Metabolic Panel - Urine culture -  cephALEXin (KEFLEX) 500 MG capsule; Take 1 capsule (500 mg total) by mouth 2 (two) times daily.--Will retreat due to leukocytosis and foul urine odor. Unable to treat with cipro due to age and bactrim d/t kidney function  HLD (hyperlipidemia) - Lipid panel - Begin atorvastatin (LIPITOR) 10 MG tablet; Take 1 tablet (10 mg total) by mouth daily. For cholesterol Education provided on proper lifestyle changes in order to lower cholesterol. Patient advised to maintain healthy weight and to keep total fat intake at 25-35% of total calories and carbohydrates 50-60% of total daily calories. Explained how high cholesterol places patient at risk for heart disease. Patient placed on appropriate medication and repeat labs in 6 months    Return in  about 3 months (around 12/11/2014) for Hypertension or sooner if needed.      Chari Manning, NP-C [facility] [phone] 09/11/2014, 3:57 PM

## 2014-09-11 NOTE — Patient Instructions (Signed)

## 2014-09-11 NOTE — Progress Notes (Signed)
HFU Here to follow up on her UTI, loss of appetite and her weight loss.

## 2014-09-12 DIAGNOSIS — R6510 Systemic inflammatory response syndrome (SIRS) of non-infectious origin without acute organ dysfunction: Secondary | ICD-10-CM | POA: Diagnosis not present

## 2014-09-12 DIAGNOSIS — N390 Urinary tract infection, site not specified: Secondary | ICD-10-CM | POA: Diagnosis not present

## 2014-09-12 DIAGNOSIS — I2510 Atherosclerotic heart disease of native coronary artery without angina pectoris: Secondary | ICD-10-CM | POA: Diagnosis not present

## 2014-09-12 DIAGNOSIS — I129 Hypertensive chronic kidney disease with stage 1 through stage 4 chronic kidney disease, or unspecified chronic kidney disease: Secondary | ICD-10-CM | POA: Diagnosis not present

## 2014-09-12 DIAGNOSIS — N189 Chronic kidney disease, unspecified: Secondary | ICD-10-CM | POA: Diagnosis not present

## 2014-09-12 DIAGNOSIS — A419 Sepsis, unspecified organism: Secondary | ICD-10-CM | POA: Diagnosis not present

## 2014-09-12 MED ORDER — CEPHALEXIN 500 MG PO CAPS: 500.0000 mg | ORAL_CAPSULE | Freq: Two times a day (BID) | ORAL | Status: DC

## 2014-09-12 MED ORDER — ATORVASTATIN CALCIUM 10 MG PO TABS: 10.0000 mg | ORAL_TABLET | Freq: Every day | ORAL | Status: DC

## 2014-09-13 DIAGNOSIS — R6510 Systemic inflammatory response syndrome (SIRS) of non-infectious origin without acute organ dysfunction: Secondary | ICD-10-CM | POA: Diagnosis not present

## 2014-09-13 DIAGNOSIS — I2510 Atherosclerotic heart disease of native coronary artery without angina pectoris: Secondary | ICD-10-CM | POA: Diagnosis not present

## 2014-09-13 DIAGNOSIS — I129 Hypertensive chronic kidney disease with stage 1 through stage 4 chronic kidney disease, or unspecified chronic kidney disease: Secondary | ICD-10-CM | POA: Diagnosis not present

## 2014-09-13 DIAGNOSIS — N390 Urinary tract infection, site not specified: Secondary | ICD-10-CM | POA: Diagnosis not present

## 2014-09-13 DIAGNOSIS — N189 Chronic kidney disease, unspecified: Secondary | ICD-10-CM | POA: Diagnosis not present

## 2014-09-13 DIAGNOSIS — A419 Sepsis, unspecified organism: Secondary | ICD-10-CM | POA: Diagnosis not present

## 2014-09-14 LAB — URINE CULTURE
COLONY COUNT: NO GROWTH
Organism ID, Bacteria: NO GROWTH

## 2014-09-16 DIAGNOSIS — N390 Urinary tract infection, site not specified: Secondary | ICD-10-CM | POA: Diagnosis not present

## 2014-09-16 DIAGNOSIS — N189 Chronic kidney disease, unspecified: Secondary | ICD-10-CM | POA: Diagnosis not present

## 2014-09-16 DIAGNOSIS — I129 Hypertensive chronic kidney disease with stage 1 through stage 4 chronic kidney disease, or unspecified chronic kidney disease: Secondary | ICD-10-CM | POA: Diagnosis not present

## 2014-09-16 DIAGNOSIS — A419 Sepsis, unspecified organism: Secondary | ICD-10-CM | POA: Diagnosis not present

## 2014-09-16 DIAGNOSIS — R6510 Systemic inflammatory response syndrome (SIRS) of non-infectious origin without acute organ dysfunction: Secondary | ICD-10-CM | POA: Diagnosis not present

## 2014-09-16 DIAGNOSIS — I2510 Atherosclerotic heart disease of native coronary artery without angina pectoris: Secondary | ICD-10-CM | POA: Diagnosis not present

## 2014-09-18 ENCOUNTER — Telehealth: Payer: Self-pay | Admitting: *Deleted

## 2014-09-18 DIAGNOSIS — I2510 Atherosclerotic heart disease of native coronary artery without angina pectoris: Secondary | ICD-10-CM | POA: Diagnosis not present

## 2014-09-18 DIAGNOSIS — N189 Chronic kidney disease, unspecified: Secondary | ICD-10-CM | POA: Diagnosis not present

## 2014-09-18 DIAGNOSIS — R6510 Systemic inflammatory response syndrome (SIRS) of non-infectious origin without acute organ dysfunction: Secondary | ICD-10-CM | POA: Diagnosis not present

## 2014-09-18 DIAGNOSIS — I129 Hypertensive chronic kidney disease with stage 1 through stage 4 chronic kidney disease, or unspecified chronic kidney disease: Secondary | ICD-10-CM | POA: Diagnosis not present

## 2014-09-18 DIAGNOSIS — A419 Sepsis, unspecified organism: Secondary | ICD-10-CM | POA: Diagnosis not present

## 2014-09-18 DIAGNOSIS — N390 Urinary tract infection, site not specified: Secondary | ICD-10-CM | POA: Diagnosis not present

## 2014-09-18 NOTE — Telephone Encounter (Signed)
Pt aware of lab results, Rx was send on 09/12/2014 to [facility]

## 2014-09-18 NOTE — Telephone Encounter (Signed)
-----   Message from Lance Bosch, NP sent at 09/12/2014 11:14 AM EST ----- Call patient and let her know that I would like to place her on a low dose cholesterol medication. Please explain that if she begins to feel muscle aches it may be due to this medication and she will need to call me back asap. I am sending lipitor 10 mg to take QD.

## 2014-09-19 DIAGNOSIS — A419 Sepsis, unspecified organism: Secondary | ICD-10-CM | POA: Diagnosis not present

## 2014-09-19 DIAGNOSIS — I2510 Atherosclerotic heart disease of native coronary artery without angina pectoris: Secondary | ICD-10-CM | POA: Diagnosis not present

## 2014-09-19 DIAGNOSIS — N189 Chronic kidney disease, unspecified: Secondary | ICD-10-CM | POA: Diagnosis not present

## 2014-09-19 DIAGNOSIS — R6510 Systemic inflammatory response syndrome (SIRS) of non-infectious origin without acute organ dysfunction: Secondary | ICD-10-CM | POA: Diagnosis not present

## 2014-09-19 DIAGNOSIS — N390 Urinary tract infection, site not specified: Secondary | ICD-10-CM | POA: Diagnosis not present

## 2014-09-19 DIAGNOSIS — I129 Hypertensive chronic kidney disease with stage 1 through stage 4 chronic kidney disease, or unspecified chronic kidney disease: Secondary | ICD-10-CM | POA: Diagnosis not present

## 2014-09-20 DIAGNOSIS — I129 Hypertensive chronic kidney disease with stage 1 through stage 4 chronic kidney disease, or unspecified chronic kidney disease: Secondary | ICD-10-CM | POA: Diagnosis not present

## 2014-09-20 DIAGNOSIS — N390 Urinary tract infection, site not specified: Secondary | ICD-10-CM | POA: Diagnosis not present

## 2014-09-20 DIAGNOSIS — N189 Chronic kidney disease, unspecified: Secondary | ICD-10-CM | POA: Diagnosis not present

## 2014-09-20 DIAGNOSIS — A419 Sepsis, unspecified organism: Secondary | ICD-10-CM | POA: Diagnosis not present

## 2014-09-20 DIAGNOSIS — I2510 Atherosclerotic heart disease of native coronary artery without angina pectoris: Secondary | ICD-10-CM | POA: Diagnosis not present

## 2014-09-20 DIAGNOSIS — R6510 Systemic inflammatory response syndrome (SIRS) of non-infectious origin without acute organ dysfunction: Secondary | ICD-10-CM | POA: Diagnosis not present

## 2014-09-23 DIAGNOSIS — R6510 Systemic inflammatory response syndrome (SIRS) of non-infectious origin without acute organ dysfunction: Secondary | ICD-10-CM | POA: Diagnosis not present

## 2014-09-23 DIAGNOSIS — I2510 Atherosclerotic heart disease of native coronary artery without angina pectoris: Secondary | ICD-10-CM | POA: Diagnosis not present

## 2014-09-23 DIAGNOSIS — A419 Sepsis, unspecified organism: Secondary | ICD-10-CM | POA: Diagnosis not present

## 2014-09-23 DIAGNOSIS — N189 Chronic kidney disease, unspecified: Secondary | ICD-10-CM | POA: Diagnosis not present

## 2014-09-23 DIAGNOSIS — N390 Urinary tract infection, site not specified: Secondary | ICD-10-CM | POA: Diagnosis not present

## 2014-09-23 DIAGNOSIS — I129 Hypertensive chronic kidney disease with stage 1 through stage 4 chronic kidney disease, or unspecified chronic kidney disease: Secondary | ICD-10-CM | POA: Diagnosis not present

## 2014-09-24 DIAGNOSIS — I129 Hypertensive chronic kidney disease with stage 1 through stage 4 chronic kidney disease, or unspecified chronic kidney disease: Secondary | ICD-10-CM | POA: Diagnosis not present

## 2014-09-24 DIAGNOSIS — A419 Sepsis, unspecified organism: Secondary | ICD-10-CM | POA: Diagnosis not present

## 2014-09-24 DIAGNOSIS — N390 Urinary tract infection, site not specified: Secondary | ICD-10-CM | POA: Diagnosis not present

## 2014-09-24 DIAGNOSIS — I2510 Atherosclerotic heart disease of native coronary artery without angina pectoris: Secondary | ICD-10-CM | POA: Diagnosis not present

## 2014-09-24 DIAGNOSIS — R6510 Systemic inflammatory response syndrome (SIRS) of non-infectious origin without acute organ dysfunction: Secondary | ICD-10-CM | POA: Diagnosis not present

## 2014-09-24 DIAGNOSIS — N189 Chronic kidney disease, unspecified: Secondary | ICD-10-CM | POA: Diagnosis not present

## 2014-09-25 DIAGNOSIS — N189 Chronic kidney disease, unspecified: Secondary | ICD-10-CM | POA: Diagnosis not present

## 2014-09-25 DIAGNOSIS — N390 Urinary tract infection, site not specified: Secondary | ICD-10-CM | POA: Diagnosis not present

## 2014-09-25 DIAGNOSIS — I2510 Atherosclerotic heart disease of native coronary artery without angina pectoris: Secondary | ICD-10-CM | POA: Diagnosis not present

## 2014-09-25 DIAGNOSIS — R6510 Systemic inflammatory response syndrome (SIRS) of non-infectious origin without acute organ dysfunction: Secondary | ICD-10-CM | POA: Diagnosis not present

## 2014-09-25 DIAGNOSIS — I129 Hypertensive chronic kidney disease with stage 1 through stage 4 chronic kidney disease, or unspecified chronic kidney disease: Secondary | ICD-10-CM | POA: Diagnosis not present

## 2014-09-25 DIAGNOSIS — A419 Sepsis, unspecified organism: Secondary | ICD-10-CM | POA: Diagnosis not present

## 2014-09-27 DIAGNOSIS — N390 Urinary tract infection, site not specified: Secondary | ICD-10-CM | POA: Diagnosis not present

## 2014-09-27 DIAGNOSIS — A419 Sepsis, unspecified organism: Secondary | ICD-10-CM | POA: Diagnosis not present

## 2014-09-27 DIAGNOSIS — R6510 Systemic inflammatory response syndrome (SIRS) of non-infectious origin without acute organ dysfunction: Secondary | ICD-10-CM | POA: Diagnosis not present

## 2014-09-27 DIAGNOSIS — N189 Chronic kidney disease, unspecified: Secondary | ICD-10-CM | POA: Diagnosis not present

## 2014-09-27 DIAGNOSIS — I129 Hypertensive chronic kidney disease with stage 1 through stage 4 chronic kidney disease, or unspecified chronic kidney disease: Secondary | ICD-10-CM | POA: Diagnosis not present

## 2014-09-27 DIAGNOSIS — I2510 Atherosclerotic heart disease of native coronary artery without angina pectoris: Secondary | ICD-10-CM | POA: Diagnosis not present

## 2014-09-30 ENCOUNTER — Telehealth: Payer: Self-pay | Admitting: *Deleted

## 2014-09-30 DIAGNOSIS — N189 Chronic kidney disease, unspecified: Secondary | ICD-10-CM | POA: Diagnosis not present

## 2014-09-30 DIAGNOSIS — R6510 Systemic inflammatory response syndrome (SIRS) of non-infectious origin without acute organ dysfunction: Secondary | ICD-10-CM | POA: Diagnosis not present

## 2014-09-30 DIAGNOSIS — A419 Sepsis, unspecified organism: Secondary | ICD-10-CM | POA: Diagnosis not present

## 2014-09-30 DIAGNOSIS — N390 Urinary tract infection, site not specified: Secondary | ICD-10-CM | POA: Diagnosis not present

## 2014-09-30 DIAGNOSIS — I2510 Atherosclerotic heart disease of native coronary artery without angina pectoris: Secondary | ICD-10-CM | POA: Diagnosis not present

## 2014-09-30 DIAGNOSIS — I129 Hypertensive chronic kidney disease with stage 1 through stage 4 chronic kidney disease, or unspecified chronic kidney disease: Secondary | ICD-10-CM | POA: Diagnosis not present

## 2014-09-30 NOTE — Telephone Encounter (Signed)
[facility] requesting VO for additional therapy, two time per week for one more week Ashlee Mack [phone]

## 2014-10-01 DIAGNOSIS — I129 Hypertensive chronic kidney disease with stage 1 through stage 4 chronic kidney disease, or unspecified chronic kidney disease: Secondary | ICD-10-CM | POA: Diagnosis not present

## 2014-10-01 DIAGNOSIS — N189 Chronic kidney disease, unspecified: Secondary | ICD-10-CM | POA: Diagnosis not present

## 2014-10-01 DIAGNOSIS — R6510 Systemic inflammatory response syndrome (SIRS) of non-infectious origin without acute organ dysfunction: Secondary | ICD-10-CM | POA: Diagnosis not present

## 2014-10-01 DIAGNOSIS — N390 Urinary tract infection, site not specified: Secondary | ICD-10-CM | POA: Diagnosis not present

## 2014-10-01 DIAGNOSIS — I2510 Atherosclerotic heart disease of native coronary artery without angina pectoris: Secondary | ICD-10-CM | POA: Diagnosis not present

## 2014-10-01 DIAGNOSIS — A419 Sepsis, unspecified organism: Secondary | ICD-10-CM | POA: Diagnosis not present

## 2014-10-01 NOTE — Telephone Encounter (Signed)
VO Given to [facility] for additional therapy, two time per week for one more week

## 2014-10-01 NOTE — Telephone Encounter (Signed)
Please call and give order. Thanks.

## 2014-10-02 DIAGNOSIS — N390 Urinary tract infection, site not specified: Secondary | ICD-10-CM | POA: Diagnosis not present

## 2014-10-02 DIAGNOSIS — I2510 Atherosclerotic heart disease of native coronary artery without angina pectoris: Secondary | ICD-10-CM | POA: Diagnosis not present

## 2014-10-02 DIAGNOSIS — R6510 Systemic inflammatory response syndrome (SIRS) of non-infectious origin without acute organ dysfunction: Secondary | ICD-10-CM | POA: Diagnosis not present

## 2014-10-02 DIAGNOSIS — I129 Hypertensive chronic kidney disease with stage 1 through stage 4 chronic kidney disease, or unspecified chronic kidney disease: Secondary | ICD-10-CM | POA: Diagnosis not present

## 2014-10-02 DIAGNOSIS — A419 Sepsis, unspecified organism: Secondary | ICD-10-CM | POA: Diagnosis not present

## 2014-10-02 DIAGNOSIS — N189 Chronic kidney disease, unspecified: Secondary | ICD-10-CM | POA: Diagnosis not present

## 2014-10-03 DIAGNOSIS — R6510 Systemic inflammatory response syndrome (SIRS) of non-infectious origin without acute organ dysfunction: Secondary | ICD-10-CM | POA: Diagnosis not present

## 2014-10-03 DIAGNOSIS — I129 Hypertensive chronic kidney disease with stage 1 through stage 4 chronic kidney disease, or unspecified chronic kidney disease: Secondary | ICD-10-CM | POA: Diagnosis not present

## 2014-10-03 DIAGNOSIS — I2510 Atherosclerotic heart disease of native coronary artery without angina pectoris: Secondary | ICD-10-CM | POA: Diagnosis not present

## 2014-10-03 DIAGNOSIS — N390 Urinary tract infection, site not specified: Secondary | ICD-10-CM | POA: Diagnosis not present

## 2014-10-03 DIAGNOSIS — N189 Chronic kidney disease, unspecified: Secondary | ICD-10-CM | POA: Diagnosis not present

## 2014-10-03 DIAGNOSIS — A419 Sepsis, unspecified organism: Secondary | ICD-10-CM | POA: Diagnosis not present

## 2014-10-09 DIAGNOSIS — R6510 Systemic inflammatory response syndrome (SIRS) of non-infectious origin without acute organ dysfunction: Secondary | ICD-10-CM | POA: Diagnosis not present

## 2014-10-09 DIAGNOSIS — I2510 Atherosclerotic heart disease of native coronary artery without angina pectoris: Secondary | ICD-10-CM | POA: Diagnosis not present

## 2014-10-09 DIAGNOSIS — A419 Sepsis, unspecified organism: Secondary | ICD-10-CM | POA: Diagnosis not present

## 2014-10-09 DIAGNOSIS — I129 Hypertensive chronic kidney disease with stage 1 through stage 4 chronic kidney disease, or unspecified chronic kidney disease: Secondary | ICD-10-CM | POA: Diagnosis not present

## 2014-10-09 DIAGNOSIS — N189 Chronic kidney disease, unspecified: Secondary | ICD-10-CM | POA: Diagnosis not present

## 2014-10-09 DIAGNOSIS — N390 Urinary tract infection, site not specified: Secondary | ICD-10-CM | POA: Diagnosis not present

## 2014-10-23 DIAGNOSIS — I2510 Atherosclerotic heart disease of native coronary artery without angina pectoris: Secondary | ICD-10-CM | POA: Diagnosis not present

## 2014-10-23 DIAGNOSIS — I129 Hypertensive chronic kidney disease with stage 1 through stage 4 chronic kidney disease, or unspecified chronic kidney disease: Secondary | ICD-10-CM | POA: Diagnosis not present

## 2014-10-23 DIAGNOSIS — N189 Chronic kidney disease, unspecified: Secondary | ICD-10-CM | POA: Diagnosis not present

## 2014-10-23 DIAGNOSIS — R6510 Systemic inflammatory response syndrome (SIRS) of non-infectious origin without acute organ dysfunction: Secondary | ICD-10-CM | POA: Diagnosis not present

## 2014-10-23 DIAGNOSIS — N390 Urinary tract infection, site not specified: Secondary | ICD-10-CM | POA: Diagnosis not present

## 2014-10-23 DIAGNOSIS — A419 Sepsis, unspecified organism: Secondary | ICD-10-CM | POA: Diagnosis not present

## 2014-11-23 ENCOUNTER — Emergency Department (HOSPITAL_COMMUNITY): Payer: Medicare Other

## 2014-11-23 ENCOUNTER — Encounter (HOSPITAL_COMMUNITY): Payer: Self-pay | Admitting: Emergency Medicine

## 2014-11-23 ENCOUNTER — Emergency Department (HOSPITAL_COMMUNITY)
Admission: EM | Admit: 2014-11-23 | Discharge: 2014-11-23 | Disposition: A | Discharge status: Home / Self Care | Payer: Medicare Other | Attending: Emergency Medicine | Admitting: Emergency Medicine

## 2014-11-23 DIAGNOSIS — I2510 Atherosclerotic heart disease of native coronary artery without angina pectoris: Secondary | ICD-10-CM | POA: Insufficient documentation

## 2014-11-23 DIAGNOSIS — A599 Trichomoniasis, unspecified: Secondary | ICD-10-CM | POA: Insufficient documentation

## 2014-11-23 DIAGNOSIS — R531 Weakness: Secondary | ICD-10-CM | POA: Diagnosis not present

## 2014-11-23 DIAGNOSIS — R0602 Shortness of breath: Secondary | ICD-10-CM | POA: Diagnosis not present

## 2014-11-23 DIAGNOSIS — Z79899 Other long term (current) drug therapy: Secondary | ICD-10-CM | POA: Diagnosis not present

## 2014-11-23 DIAGNOSIS — I10 Essential (primary) hypertension: Secondary | ICD-10-CM | POA: Diagnosis not present

## 2014-11-23 DIAGNOSIS — R05 Cough: Secondary | ICD-10-CM | POA: Diagnosis not present

## 2014-11-23 LAB — CBC WITH DIFFERENTIAL/PLATELET
Basophils Absolute: 0.1 10*3/uL (ref 0.0–0.1)
Basophils Relative: 1 % (ref 0–1)
Eosinophils Absolute: 0.2 10*3/uL (ref 0.0–0.7)
Eosinophils Relative: 2 % (ref 0–5)
HEMATOCRIT: 30.5 % — AB (ref 36.0–46.0)
HEMOGLOBIN: 9.6 g/dL — AB (ref 12.0–15.0)
Lymphocytes Relative: 39 % (ref 12–46)
Lymphs Abs: 3 10*3/uL (ref 0.7–4.0)
MCH: 24.8 pg — AB (ref 26.0–34.0)
MCHC: 31.5 g/dL (ref 30.0–36.0)
MCV: 78.8 fL (ref 78.0–100.0)
Monocytes Absolute: 0.8 10*3/uL (ref 0.1–1.0)
Monocytes Relative: 10 % (ref 3–12)
Neutro Abs: 3.6 10*3/uL (ref 1.7–7.7)
Neutrophils Relative %: 48 % (ref 43–77)
PLATELETS: 674 10*3/uL — AB (ref 150–400)
RBC: 3.87 MIL/uL (ref 3.87–5.11)
RDW: 24.1 % — AB (ref 11.5–15.5)
WBC: 7.7 10*3/uL (ref 4.0–10.5)

## 2014-11-23 LAB — URINALYSIS, ROUTINE W REFLEX MICROSCOPIC
Bilirubin Urine: NEGATIVE
Glucose, UA: NEGATIVE mg/dL
HGB URINE DIPSTICK: NEGATIVE
Ketones, ur: NEGATIVE mg/dL
NITRITE: NEGATIVE
Protein, ur: NEGATIVE mg/dL
Specific Gravity, Urine: 1.004 — ABNORMAL LOW (ref 1.005–1.030)
UROBILINOGEN UA: 1 mg/dL (ref 0.0–1.0)
pH: 7.5 (ref 5.0–8.0)

## 2014-11-23 LAB — COMPREHENSIVE METABOLIC PANEL
ALT: 16 U/L (ref 0–35)
AST: 32 U/L (ref 0–37)
Albumin: 3.9 g/dL (ref 3.5–5.2)
Alkaline Phosphatase: 54 U/L (ref 39–117)
Anion gap: 12 (ref 5–15)
BUN: 15 mg/dL (ref 6–23)
CO2: 24 mmol/L (ref 19–32)
Calcium: 9.1 mg/dL (ref 8.4–10.5)
Chloride: 103 mmol/L (ref 96–112)
Creatinine, Ser: 1.1 mg/dL (ref 0.50–1.10)
GFR calc non Af Amer: 49 mL/min — ABNORMAL LOW (ref >90)
GFR, EST AFRICAN AMERICAN: 57 mL/min — AB (ref >90)
Glucose, Bld: 133 mg/dL — ABNORMAL HIGH (ref 70–99)
POTASSIUM: 3.7 mmol/L (ref 3.5–5.1)
SODIUM: 139 mmol/L (ref 135–145)
Total Bilirubin: 0.5 mg/dL (ref 0.3–1.2)
Total Protein: 8.2 g/dL (ref 6.0–8.3)

## 2014-11-23 LAB — URINE MICROSCOPIC-ADD ON

## 2014-11-23 LAB — I-STAT TROPONIN, ED: Troponin i, poc: 0 ng/mL (ref 0.00–0.08)

## 2014-11-23 MED ORDER — SODIUM CHLORIDE 0.9 % IV BOLUS (SEPSIS)
500.0000 mL | Freq: Once | INTRAVENOUS | Status: AC
  Administered 2014-11-23: 500 mL via INTRAVENOUS

## 2014-11-23 MED ORDER — METRONIDAZOLE 500 MG PO TABS
2000.0000 mg | ORAL_TABLET | Freq: Once | ORAL | Status: AC
  Administered 2014-11-23: 2000 mg via ORAL
  Filled 2014-11-23: qty 4

## 2014-11-23 NOTE — ED Provider Notes (Signed)
TIME SEEN: 1:55 PM  CHIEF COMPLAINT: Generalized weakness  HPI: Pt is a 74 y.o. female with history of CAD, hypertension who presents the emergency room with generalized weakness for the past 2 weeks. Patient denies any fevers, cough, chest pain or shortness of breath, vomiting or diarrhea, numbness, tingling or focal weakness. Reports that she felt weak when she tries to stand up. No lightheadedness.  ROS: See HPI Constitutional: no fever  Eyes: no drainage  ENT: no runny nose   Cardiovascular:  no chest pain  Resp: no SOB  GI: no vomiting GU: no dysuria Integumentary: no rash  Allergy: no hives  Musculoskeletal: no leg swelling  Neurological: no slurred speech ROS otherwise negative  PAST MEDICAL HISTORY/PAST SURGICAL HISTORY:  Past Medical History  Diagnosis Date  . Coronary artery disease   . Hypertension     MEDICATIONS:  Prior to Admission medications   Medication Sig Start Date End Date Taking? Authorizing Provider  amLODipine (NORVASC) 10 MG tablet Take 1 tablet (10 mg total) by mouth daily. 04/16/14   Lance Bosch, NP  atorvastatin (LIPITOR) 10 MG tablet Take 1 tablet (10 mg total) by mouth daily. For cholesterol 09/12/14   Lance Bosch, NP  cefUROXime (CEFTIN) 500 MG tablet Take 1 tablet (500 mg total) by mouth 2 (two) times daily with a meal. Patient not taking: Reported on 11/23/2014 08/29/14   Barton Dubois, MD  cephALEXin (KEFLEX) 500 MG capsule Take 1 capsule (500 mg total) by mouth 2 (two) times daily. Patient not taking: Reported on 11/23/2014 09/12/14   Lance Bosch, NP  feeding supplement, ENSURE COMPLETE, (ENSURE COMPLETE) LIQD Take 237 mLs by mouth 2 (two) times daily between meals. 08/29/14   Barton Dubois, MD    ALLERGIES:  No Known Allergies  SOCIAL HISTORY:  History  Substance Use Topics  . Smoking status: Never Smoker   . Smokeless tobacco: Not on file  . Alcohol Use: No    FAMILY HISTORY: Family History  Problem Relation Age of Onset   . Hypertension Mother   . Hypertension Father     EXAM: BP 180/89 mmHg  Pulse 101  Temp(Src) 98.9 F (37.2 C) (Oral)  Resp 20  SpO2 99% CONSTITUTIONAL: Alert and oriented and responds appropriately to questions. Well-appearing; well-nourished HEAD: Normocephalic EYES: Conjunctivae clear, PERRL ENT: normal nose; no rhinorrhea; moist mucous membranes; pharynx without lesions noted NECK: Supple, no meningismus, no LAD  CARD: RRR; S1 and S2 appreciated; no murmurs, no clicks, no rubs, no gallops RESP: Normal chest excursion without splinting or tachypnea; breath sounds clear and equal bilaterally; no wheezes, no rhonchi, no rales, no hypoxia or respiratory distress ABD/GI: Normal bowel sounds; non-distended; soft, non-tender, no rebound, no guarding BACK:  The back appears normal and is non-tender to palpation, there is no CVA tenderness EXT: Normal ROM in all joints; non-tender to palpation; no edema; normal capillary refill; no cyanosis    SKIN: Normal color for age and race; warm NEURO: Moves all extremities equally, strength 5/5 in all 4 extremities, sensation to light touch intact diffusely, cranial nerves II through XII intact, normal gait PSYCH: The patient's mood and manner are appropriate. Grooming and personal hygiene are appropriate.  MEDICAL DECISION MAKING: Patient here with generalized weakness with benign exam. She is neurologically intact. Hemodynamically stable. We'll give IV fluids, obtain basic labs, troponin, chest x-ray, urine to evaluate for organic causes for her weakness.  ED PROGRESS: Labs unremarkable. She has anemia but this is baseline. Troponin  negative. Chest x-ray clear. Urine is pending. Patient urinated in the bed. We'll give second 500 mL IV fluid bolus and by mouth challenge and then perform I&O cath.   Urine shows small leukocytes. Also shows trichomonas. Will give her a dose of 2 g of Flagyl in the ED. We'll discharge patient home with outpatient  follow-up information. Discussed return precautions. She verbalized understanding and is comfortable with plan.      EKG Interpretation  Date/Time:  Saturday November 23 2014 13:54:32 EST Ventricular Rate:  100 PR Interval:  204 QRS Duration: 103 QT Interval:  375 QTC Calculation: 484 R Axis:   17 Text Interpretation:  Sinus tachycardia Consider left atrial enlargement Anteroseptal infarct, old No significant change since last tracing other than rate is faster Confirmed by Froylan Hobby,  DO, Mattie Novosel [phone]) on 11/23/2014 1:59:44 PM        [place], DO 11/23/14 1701

## 2014-11-23 NOTE — ED Notes (Signed)
Pt BIB family.  When asked what was going on with pt, family states "look at the records".  Unable to get much information other than that the pt has been weak x 2 wks.  Family is enthralled with basketball game on TV and not forthcoming with information.

## 2014-11-23 NOTE — ED Notes (Signed)
Pt son- [phone], Jeannette Corpus

## 2014-11-23 NOTE — ED Notes (Signed)
MD (Ward) at bedside. 

## 2014-11-23 NOTE — Discharge Instructions (Signed)
Weakness Weakness is a lack of strength. It may be felt all over the body (generalized) or in one specific part of the body (focal). Some causes of weakness can be serious. You may need further medical evaluation, especially if you are elderly or you have a history of immunosuppression (such as chemotherapy or HIV), kidney disease, heart disease, or diabetes. CAUSES  Weakness can be caused by many different things, including:  Infection.  Physical exhaustion.  Internal bleeding or other blood loss that results in a lack of red blood cells (anemia).  Dehydration. This cause is more common in elderly people.  Side effects or electrolyte abnormalities from medicines, such as pain medicines or sedatives.  Emotional distress, anxiety, or depression.  Circulation problems, especially severe peripheral arterial disease.  Heart disease, such as rapid atrial fibrillation, bradycardia, or heart failure.  Nervous system disorders, such as Guillain-Barr syndrome, multiple sclerosis, or stroke. DIAGNOSIS  To find the cause of your weakness, your caregiver will take your history and perform a physical exam. Lab tests or X-rays may also be ordered, if needed. TREATMENT  Treatment of weakness depends on the cause of your symptoms and can vary greatly. HOME CARE INSTRUCTIONS   Rest as needed.  Eat a well-balanced diet.  Try to get some exercise every day.  Only take over-the-counter or prescription medicines as directed by your caregiver. SEEK MEDICAL CARE IF:   Your weakness seems to be getting worse or spreads to other parts of your body.  You develop new aches or pains. SEEK IMMEDIATE MEDICAL CARE IF:   You cannot perform your normal daily activities, such as getting dressed and feeding yourself.  You cannot walk up and down stairs, or you feel exhausted when you do so.  You have shortness of breath or chest pain.  You have difficulty moving parts of your body.  You have weakness  in only one area of the body or on only one side of the body.  You have a fever.  You have trouble speaking or swallowing.  You cannot control your bladder or bowel movements.  You have black or bloody vomit or stools. MAKE SURE YOU:  Understand these instructions.  Will watch your condition.  Will get help right away if you are not doing well or get worse. Document Released: 08/30/2005 Document Revised: 02/29/2012 Document Reviewed: 10/29/2011 [facility] Patient Information 2015 [place], [place]. This information is not intended to replace advice given to you by your health care provider. Make sure you discuss any questions you have with your health care provider.  Trichomoniasis Trichomoniasis is an infection caused by an organism called Trichomonas. The infection can affect both women and men. In women, the outer female genitalia and the vagina are affected. In men, the penis is mainly affected, but the prostate and other reproductive organs can also be involved. Trichomoniasis is a sexually transmitted infection (STI) and is most often passed to another person through sexual contact.  RISK FACTORS  Having unprotected sexual intercourse.  Having sexual intercourse with an infected partner. SIGNS AND SYMPTOMS  Symptoms of trichomoniasis in women include:  Abnormal gray-green frothy vaginal discharge.  Itching and irritation of the vagina.  Itching and irritation of the area outside the vagina. Symptoms of trichomoniasis in men include:   Penile discharge with or without pain.  Pain during urination. This results from inflammation of the urethra. DIAGNOSIS  Trichomoniasis may be found during a Pap test or physical exam. Your health care provider may use one  of the following methods to help diagnose this infection:  Examining vaginal discharge under a microscope. For men, urethral discharge would be examined.  Testing the pH of the vagina with a test tape.  Using a vaginal  swab test that checks for the Trichomonas organism. A test is available that provides results within a few minutes.  Doing a culture test for the organism. This is not usually needed. TREATMENT   You may be given medicine to fight the infection. Women should inform their health care provider if they could be or are pregnant. Some medicines used to treat the infection should not be taken during pregnancy.  Your health care provider may recommend over-the-counter medicines or creams to decrease itching or irritation.  Your sexual partner will need to be treated if infected. HOME CARE INSTRUCTIONS   Take medicines only as directed by your health care provider.  Take over-the-counter medicine for itching or irritation as directed by your health care provider.  Do not have sexual intercourse while you have the infection.  Women should not douche or wear tampons while they have the infection.  Discuss your infection with your partner. Your partner may have gotten the infection from you, or you may have gotten it from your partner.  Have your sex partner get examined and treated if necessary.  Practice safe, informed, and protected sex.  See your health care provider for other STI testing. SEEK MEDICAL CARE IF:   You still have symptoms after you finish your medicine.  You develop abdominal pain.  You have pain when you urinate.  You have bleeding after sexual intercourse.  You develop a rash.  Your medicine makes you sick or makes you throw up (vomit). MAKE SURE YOU:  Understand these instructions.  Will watch your condition.  Will get help right away if you are not doing well or get worse. Document Released: 02/23/2001 Document Revised: 01/14/2014 Document Reviewed: 06/11/2013 [facility] Patient Information 2015 [place], [place]. This information is not intended to replace advice given to you by your health care provider. Make sure you discuss any questions you have with your  health care provider.

## 2014-12-02 ENCOUNTER — Ambulatory Visit: Payer: Medicare Other | Admitting: Internal Medicine

## 2014-12-09 ENCOUNTER — Encounter: Payer: Self-pay | Admitting: Internal Medicine

## 2014-12-09 ENCOUNTER — Ambulatory Visit: Payer: Medicare Other | Attending: Internal Medicine | Admitting: Internal Medicine

## 2014-12-09 VITALS — BP 158/88 | HR 10 | Temp 98.3°F | Resp 16

## 2014-12-09 DIAGNOSIS — I2510 Atherosclerotic heart disease of native coronary artery without angina pectoris: Secondary | ICD-10-CM | POA: Diagnosis not present

## 2014-12-09 DIAGNOSIS — R531 Weakness: Secondary | ICD-10-CM | POA: Diagnosis not present

## 2014-12-09 DIAGNOSIS — I10 Essential (primary) hypertension: Secondary | ICD-10-CM | POA: Diagnosis not present

## 2014-12-09 DIAGNOSIS — I2583 Coronary atherosclerosis due to lipid rich plaque: Secondary | ICD-10-CM | POA: Insufficient documentation

## 2014-12-09 DIAGNOSIS — R32 Unspecified urinary incontinence: Secondary | ICD-10-CM | POA: Diagnosis not present

## 2014-12-09 DIAGNOSIS — R630 Anorexia: Secondary | ICD-10-CM | POA: Diagnosis not present

## 2014-12-09 MED ORDER — ASPIRIN EC 81 MG PO TBEC: 81.0000 mg | DELAYED_RELEASE_TABLET | Freq: Every day | ORAL | Status: DC

## 2014-12-09 NOTE — Patient Instructions (Signed)
Please take your medication daily. Your blood pressure is elevated today.   Please begin taking a once daily aspirin.

## 2014-12-09 NOTE — Progress Notes (Signed)
Patient ID: Ashlee Mack, female   DOB: June 21, 1941, 74 y.o.   MRN: [id number]  CC: poor appetite, crying, sleep difficulties  HPI: Ashlee Mack is a 74 y.o. female here today for a follow up visit.  Patient has past medical history of HTN and coronary artery disease. Patient's family reports that she has little appetite and lays around all day. She has recently been taking Ensure drinks twice daily. Patient and family both report that her memory is good. Her family states that she often hears people telling her that she cannot eat or get up to do things. Family reports that they only tell her to not get up by herself d/t fear of falls. She reports that she had a psychiatric evaluation several years ago but does not remember what was found.   Her family reports that she missed a few days of blood pressure medication so her pressures may be elevated. Patient states that her daughter in law and other family help her to remember which medications to take.   Patient has No headache, No chest pain, No abdominal pain - No Nausea, No new weakness tingling or numbness, No Cough - SOB.  No Known Allergies Past Medical History  Diagnosis Date  . Coronary artery disease   . Hypertension    Current Outpatient Prescriptions on File Prior to Visit  Medication Sig Dispense Refill  . amLODipine (NORVASC) 10 MG tablet Take 1 tablet (10 mg total) by mouth daily. 90 tablet 2  . atorvastatin (LIPITOR) 10 MG tablet Take 1 tablet (10 mg total) by mouth daily. For cholesterol 90 tablet 3  . feeding supplement, ENSURE COMPLETE, (ENSURE COMPLETE) LIQD Take 237 mLs by mouth 2 (two) times daily between meals.    . cefUROXime (CEFTIN) 500 MG tablet Take 1 tablet (500 mg total) by mouth 2 (two) times daily with a meal. (Patient not taking: Reported on 11/23/2014) 14 tablet 0  . cephALEXin (KEFLEX) 500 MG capsule Take 1 capsule (500 mg total) by mouth 2 (two) times daily. (Patient not taking: Reported on 11/23/2014) 14 capsule  0   No current facility-administered medications on file prior to visit.   Family History  Problem Relation Age of Onset  . Hypertension Mother   . Hypertension Father    History   Social History  . Marital Status: Married    Spouse Name: N/A  . Number of Children: N/A  . Years of Education: N/A   Occupational History  . Not on file.   Social History Main Topics  . Smoking status: Never Smoker   . Smokeless tobacco: Not on file  . Alcohol Use: No  . Drug Use: No  . Sexual Activity: Not on file   Other Topics Concern  . Not on file   Social History Narrative    Review of Systems  Constitutional: Negative for fever, chills and weight loss.  Gastrointestinal: Negative for nausea, vomiting and abdominal pain.  Genitourinary: Negative for dysuria.       Sometimes cannot make it to bathroom in time Some urinary incontinence at times    Neurological: Negative for dizziness.  Psychiatric/Behavioral:       Crying spells when attempting to express self Patient denies depression, low PHQ score  All other systems reviewed and are negative.     Objective:   Filed Vitals:   12/09/14 1550  BP: 158/88  Pulse: 10  Temp: 98.3 F (36.8 C)  Resp: 16    Physical Exam  Constitutional: She  is oriented to person, place, and time.  Cardiovascular: Normal rate, regular rhythm and normal heart sounds.   Pulmonary/Chest: Effort normal and breath sounds normal.  Abdominal: Soft. Bowel sounds are normal.  Musculoskeletal:  In wheelchair. Patient is mobile but is noticeably weak  Neurological: She is alert and oriented to person, place, and time.  Skin: Skin is warm and dry.  Psychiatric: She has a normal mood and affect. Her behavior is normal. Thought content normal.     Lab Results  Component Value Date   WBC 7.7 11/23/2014   HGB 9.6* 11/23/2014   HCT 30.5* 11/23/2014   MCV 78.8 11/23/2014   PLT 674* 11/23/2014   Lab Results  Component Value Date   CREATININE  1.10 11/23/2014   BUN 15 11/23/2014   NA 139 11/23/2014   K 3.7 11/23/2014   CL 103 11/23/2014   CO2 24 11/23/2014    Lab Results  Component Value Date   HGBA1C 6.0* 04/09/2014   Lipid Panel     Component Value Date/Time   CHOL 199 09/11/2014 1616   TRIG 67 09/11/2014 1616   HDL 49 09/11/2014 1616   CHOLHDL 4.1 09/11/2014 1616   VLDL 13 09/11/2014 1616   LDLCALC 137* 09/11/2014 1616       Assessment and plan:   Ashlee Mack was seen today for follow-up.  Diagnoses and all orders for this visit:  Urinary incontinence, unspecified incontinence type Orders: -     Elevated toilet seat. To make it easier for patient to get on and off toilet, decrease chances of falls and motivate patient to get up and use restroom. I have explained to family to place patient on a every 2 hour bathroom schedule to prevent incontinence episodes. Patient must not sit in urine for long periods of time, increase risk of UTI's.   Coronary artery disease due to lipid rich plaque Orders: -     aspirin EC 81 MG tablet; Take 1 tablet (81 mg total) by mouth daily.  Poor appetite Encouraged family to continue giving Ensure drinks to ensure proper caloric intake.   Patient scored very well on Mini Mental Status exam >22. I have little suspicion of cognitive impairment. I have questioned family and patient several times about depression symptoms and they all deny with the exception of one family member. My exam findings are inconsistent with depressive symptoms other than frequent crying spells in ROS, which family reports that patient has had for several years.  >50% of visit was spent interviewing and counseling       Chari Manning, [facility] [phone] 12/09/2014, 4:09 PM

## 2014-12-09 NOTE — Progress Notes (Signed)
Pt is here following up on her HTN. Pt has no C.C. Today.

## 2014-12-10 DIAGNOSIS — R630 Anorexia: Secondary | ICD-10-CM | POA: Insufficient documentation

## 2014-12-13 NOTE — Progress Notes (Signed)
Order for incontinence supplies and pertinent chart notes faxed to [facility].

## 2015-02-09 ENCOUNTER — Encounter (HOSPITAL_COMMUNITY): Payer: Self-pay | Admitting: Emergency Medicine

## 2015-02-09 ENCOUNTER — Emergency Department (HOSPITAL_COMMUNITY): Payer: Medicare Other

## 2015-02-09 ENCOUNTER — Inpatient Hospital Stay (HOSPITAL_COMMUNITY)
Admission: EM | Admit: 2015-02-09 | Discharge: 2015-02-12 | DRG: 871 | Disposition: A | Discharge status: Home / Self Care | Payer: Medicare Other | Attending: Internal Medicine | Admitting: Internal Medicine

## 2015-02-09 DIAGNOSIS — A419 Sepsis, unspecified organism: Secondary | ICD-10-CM | POA: Diagnosis not present

## 2015-02-09 DIAGNOSIS — R0602 Shortness of breath: Secondary | ICD-10-CM | POA: Diagnosis not present

## 2015-02-09 DIAGNOSIS — N10 Acute tubulo-interstitial nephritis: Secondary | ICD-10-CM | POA: Diagnosis not present

## 2015-02-09 DIAGNOSIS — N3289 Other specified disorders of bladder: Secondary | ICD-10-CM | POA: Diagnosis not present

## 2015-02-09 DIAGNOSIS — I2510 Atherosclerotic heart disease of native coronary artery without angina pectoris: Secondary | ICD-10-CM | POA: Diagnosis present

## 2015-02-09 DIAGNOSIS — G9340 Encephalopathy, unspecified: Secondary | ICD-10-CM | POA: Diagnosis not present

## 2015-02-09 DIAGNOSIS — E876 Hypokalemia: Secondary | ICD-10-CM | POA: Diagnosis present

## 2015-02-09 DIAGNOSIS — Z7982 Long term (current) use of aspirin: Secondary | ICD-10-CM

## 2015-02-09 DIAGNOSIS — D509 Iron deficiency anemia, unspecified: Secondary | ICD-10-CM | POA: Diagnosis present

## 2015-02-09 DIAGNOSIS — K6289 Other specified diseases of anus and rectum: Secondary | ICD-10-CM | POA: Diagnosis not present

## 2015-02-09 DIAGNOSIS — N183 Chronic kidney disease, stage 3 (moderate): Secondary | ICD-10-CM | POA: Diagnosis present

## 2015-02-09 DIAGNOSIS — N390 Urinary tract infection, site not specified: Secondary | ICD-10-CM | POA: Diagnosis not present

## 2015-02-09 DIAGNOSIS — K5901 Slow transit constipation: Secondary | ICD-10-CM | POA: Diagnosis not present

## 2015-02-09 DIAGNOSIS — R52 Pain, unspecified: Secondary | ICD-10-CM

## 2015-02-09 DIAGNOSIS — E872 Acidosis, unspecified: Secondary | ICD-10-CM | POA: Diagnosis present

## 2015-02-09 DIAGNOSIS — K5900 Constipation, unspecified: Secondary | ICD-10-CM | POA: Diagnosis present

## 2015-02-09 DIAGNOSIS — N1330 Unspecified hydronephrosis: Secondary | ICD-10-CM | POA: Diagnosis not present

## 2015-02-09 DIAGNOSIS — K5909 Other constipation: Secondary | ICD-10-CM | POA: Diagnosis not present

## 2015-02-09 DIAGNOSIS — I129 Hypertensive chronic kidney disease with stage 1 through stage 4 chronic kidney disease, or unspecified chronic kidney disease: Secondary | ICD-10-CM | POA: Diagnosis present

## 2015-02-09 DIAGNOSIS — L8990 Pressure ulcer of unspecified site, unspecified stage: Secondary | ICD-10-CM | POA: Insufficient documentation

## 2015-02-09 DIAGNOSIS — E440 Moderate protein-calorie malnutrition: Secondary | ICD-10-CM | POA: Diagnosis present

## 2015-02-09 DIAGNOSIS — I10 Essential (primary) hypertension: Secondary | ICD-10-CM | POA: Diagnosis present

## 2015-02-09 DIAGNOSIS — B9620 Unspecified Escherichia coli [E. coli] as the cause of diseases classified elsewhere: Secondary | ICD-10-CM | POA: Diagnosis present

## 2015-02-09 DIAGNOSIS — G9341 Metabolic encephalopathy: Secondary | ICD-10-CM | POA: Diagnosis present

## 2015-02-09 DIAGNOSIS — R6520 Severe sepsis without septic shock: Secondary | ICD-10-CM | POA: Diagnosis present

## 2015-02-09 DIAGNOSIS — R531 Weakness: Secondary | ICD-10-CM | POA: Diagnosis not present

## 2015-02-09 DIAGNOSIS — R1030 Lower abdominal pain, unspecified: Secondary | ICD-10-CM | POA: Diagnosis not present

## 2015-02-09 DIAGNOSIS — N281 Cyst of kidney, acquired: Secondary | ICD-10-CM | POA: Diagnosis not present

## 2015-02-09 LAB — COMPREHENSIVE METABOLIC PANEL
ALT: 16 U/L (ref 14–54)
AST: 33 U/L (ref 15–41)
Albumin: 4.1 g/dL (ref 3.5–5.0)
Alkaline Phosphatase: 70 U/L (ref 38–126)
Anion gap: 11 (ref 5–15)
BILIRUBIN TOTAL: 0.8 mg/dL (ref 0.3–1.2)
BUN: 19 mg/dL (ref 6–20)
CO2: 26 mmol/L (ref 22–32)
Calcium: 9.4 mg/dL (ref 8.9–10.3)
Chloride: 101 mmol/L (ref 101–111)
Creatinine, Ser: 1.27 mg/dL — ABNORMAL HIGH (ref 0.44–1.00)
GFR calc Af Amer: 48 mL/min — ABNORMAL LOW (ref >60)
GFR calc non Af Amer: 41 mL/min — ABNORMAL LOW (ref >60)
Glucose, Bld: 136 mg/dL — ABNORMAL HIGH (ref 65–99)
POTASSIUM: 3.6 mmol/L (ref 3.5–5.1)
Sodium: 138 mmol/L (ref 135–145)
TOTAL PROTEIN: 8.9 g/dL — AB (ref 6.5–8.1)

## 2015-02-09 LAB — URINALYSIS, ROUTINE W REFLEX MICROSCOPIC
Bilirubin Urine: NEGATIVE
GLUCOSE, UA: NEGATIVE mg/dL
KETONES UR: NEGATIVE mg/dL
NITRITE: NEGATIVE
Protein, ur: 100 mg/dL — AB
SPECIFIC GRAVITY, URINE: 1.009 (ref 1.005–1.030)
UROBILINOGEN UA: 1 mg/dL (ref 0.0–1.0)
pH: 7 (ref 5.0–8.0)

## 2015-02-09 LAB — I-STAT TROPONIN, ED: TROPONIN I, POC: 0 ng/mL (ref 0.00–0.08)

## 2015-02-09 LAB — I-STAT CHEM 8, ED
BUN: 18 mg/dL (ref 6–20)
CHLORIDE: 100 mmol/L — AB (ref 101–111)
Calcium, Ion: 1.12 mmol/L — ABNORMAL LOW (ref 1.13–1.30)
Creatinine, Ser: 1.4 mg/dL — ABNORMAL HIGH (ref 0.44–1.00)
Glucose, Bld: 137 mg/dL — ABNORMAL HIGH (ref 65–99)
HEMATOCRIT: 35 % — AB (ref 36.0–46.0)
HEMOGLOBIN: 11.9 g/dL — AB (ref 12.0–15.0)
POTASSIUM: 3.4 mmol/L — AB (ref 3.5–5.1)
Sodium: 140 mmol/L (ref 135–145)
TCO2: 21 mmol/L (ref 0–100)

## 2015-02-09 LAB — I-STAT CG4 LACTIC ACID, ED: Lactic Acid, Venous: 2.02 mmol/L (ref 0.5–2.0)

## 2015-02-09 LAB — CBC
HCT: 31.4 % — ABNORMAL LOW (ref 36.0–46.0)
Hemoglobin: 10.1 g/dL — ABNORMAL LOW (ref 12.0–15.0)
MCH: 24.5 pg — ABNORMAL LOW (ref 26.0–34.0)
MCHC: 32.2 g/dL (ref 30.0–36.0)
MCV: 76 fL — ABNORMAL LOW (ref 78.0–100.0)
Platelets: 601 10*3/uL — ABNORMAL HIGH (ref 150–400)
RBC: 4.13 MIL/uL (ref 3.87–5.11)
RDW: 25.2 % — AB (ref 11.5–15.5)
WBC: 16.1 10*3/uL — ABNORMAL HIGH (ref 4.0–10.5)

## 2015-02-09 LAB — URINE MICROSCOPIC-ADD ON

## 2015-02-09 LAB — BRAIN NATRIURETIC PEPTIDE: B Natriuretic Peptide: 79.5 pg/mL (ref 0.0–100.0)

## 2015-02-09 LAB — CBG MONITORING, ED: Glucose-Capillary: 124 mg/dL — ABNORMAL HIGH (ref 65–99)

## 2015-02-09 MED ORDER — SODIUM CHLORIDE 0.9 % IV BOLUS (SEPSIS)
500.0000 mL | Freq: Once | INTRAVENOUS | Status: AC
  Administered 2015-02-09: 500 mL via INTRAVENOUS

## 2015-02-09 MED ORDER — DEXTROSE 5 % IV SOLN
1.0000 g | Freq: Once | INTRAVENOUS | Status: AC
  Administered 2015-02-09: 1 g via INTRAVENOUS
  Filled 2015-02-09: qty 10

## 2015-02-09 NOTE — ED Provider Notes (Signed)
CSN: [id number]     Arrival date & time 02/09/15  1916 History   First MD Initiated Contact with Patient 02/09/15 2002     Chief Complaint  Patient presents with  . Weakness     (Consider location/radiation/quality/duration/timing/severity/associated sxs/prior Treatment) Patient is a 74 y.o. female presenting with weakness. The history is provided by the patient (pt complains of weakness).  Weakness This is a new problem. The current episode started more than 2 days ago. The problem occurs constantly. The problem has not changed since onset.Associated symptoms include abdominal pain. Pertinent negatives include no chest pain and no headaches. Nothing aggravates the symptoms. Nothing relieves the symptoms.    Past Medical History  Diagnosis Date  . Coronary artery disease   . Hypertension    Past Surgical History  Procedure Laterality Date  . Coronary angioplasty with stent placement     Family History  Problem Relation Age of Onset  . Hypertension Mother   . Hypertension Father    History  Substance Use Topics  . Smoking status: Never Smoker   . Smokeless tobacco: Not on file  . Alcohol Use: No   OB History    No data available     Review of Systems  Constitutional: Negative for appetite change and fatigue.  HENT: Negative for congestion, ear discharge and sinus pressure.   Eyes: Negative for discharge.  Respiratory: Negative for cough.   Cardiovascular: Negative for chest pain.  Gastrointestinal: Positive for abdominal pain. Negative for diarrhea.  Genitourinary: Negative for frequency and hematuria.  Musculoskeletal: Negative for back pain.  Skin: Negative for rash.  Neurological: Positive for weakness. Negative for seizures and headaches.  Psychiatric/Behavioral: Negative for hallucinations.      Allergies  Review of patient's allergies indicates no known allergies.  Home Medications   Prior to Admission medications   Medication Sig Start Date End Date  Taking? Authorizing Provider  amLODipine (NORVASC) 10 MG tablet Take 1 tablet (10 mg total) by mouth daily. 04/16/14  Yes Lance Bosch, NP  aspirin EC 81 MG tablet Take 1 tablet (81 mg total) by mouth daily. 12/09/14  Yes Lance Bosch, NP  atorvastatin (LIPITOR) 10 MG tablet Take 1 tablet (10 mg total) by mouth daily. For cholesterol 09/12/14  Yes Lance Bosch, NP  cefUROXime (CEFTIN) 500 MG tablet Take 1 tablet (500 mg total) by mouth 2 (two) times daily with a meal. Patient not taking: Reported on 11/23/2014 08/29/14   Barton Dubois, MD  cephALEXin (KEFLEX) 500 MG capsule Take 1 capsule (500 mg total) by mouth 2 (two) times daily. Patient not taking: Reported on 11/23/2014 09/12/14   Lance Bosch, NP  feeding supplement, ENSURE COMPLETE, (ENSURE COMPLETE) LIQD Take 237 mLs by mouth 2 (two) times daily between meals. Patient not taking: Reported on 02/09/2015 08/29/14   Barton Dubois, MD   BP 168/83 mmHg  Pulse 109  Temp(Src) 99.8 F (37.7 C) (Oral)  Resp 19  Ht 5\' 2"  (1.575 m)  Wt 108 lb (48.988 kg)  BMI 19.75 kg/m2  SpO2 98% Physical Exam  Constitutional: She is oriented to person, place, and time. She appears well-developed.  HENT:  Head: Normocephalic.  Eyes: Conjunctivae and EOM are normal. No scleral icterus.  Neck: Neck supple. No thyromegaly present.  Cardiovascular: Normal rate and regular rhythm.  Exam reveals no gallop and no friction rub.   No murmur heard. Pulmonary/Chest: No stridor. She has no wheezes. She has no rales. She exhibits no tenderness.  Abdominal: She exhibits no distension. There is tenderness. There is no rebound.  Mild suprapubic tenderness  Musculoskeletal: Normal range of motion. She exhibits no edema.  Lymphadenopathy:    She has no cervical adenopathy.  Neurological: She is oriented to person, place, and time. She exhibits normal muscle tone. Coordination normal.  Skin: No rash noted. No erythema.  Psychiatric: She has a normal mood and affect.  Her behavior is normal.    ED Course  Procedures (including critical care time) Labs Review Labs Reviewed  CBC - Abnormal; Notable for the following:    WBC 16.1 (*)    Hemoglobin 10.1 (*)    HCT 31.4 (*)    MCV 76.0 (*)    MCH 24.5 (*)    RDW 25.2 (*)    Platelets 601 (*)    All other components within normal limits  URINALYSIS, ROUTINE W REFLEX MICROSCOPIC (NOT AT [facility]) - Abnormal; Notable for the following:    APPearance TURBID (*)    Hgb urine dipstick MODERATE (*)    Protein, ur 100 (*)    Leukocytes, UA LARGE (*)    All other components within normal limits  CBG MONITORING, ED - Abnormal; Notable for the following:    Glucose-Capillary 124 (*)    All other components within normal limits  I-STAT CHEM 8, ED - Abnormal; Notable for the following:    Potassium 3.4 (*)    Chloride 100 (*)    Creatinine, Ser 1.40 (*)    Glucose, Bld 137 (*)    Calcium, Ion 1.12 (*)    Hemoglobin 11.9 (*)    HCT 35.0 (*)    All other components within normal limits  I-STAT CG4 LACTIC ACID, ED - Abnormal; Notable for the following:    Lactic Acid, Venous 2.02 (*)    All other components within normal limits  URINE CULTURE  BRAIN NATRIURETIC PEPTIDE  URINE MICROSCOPIC-ADD ON  COMPREHENSIVE METABOLIC PANEL  I-STAT TROPOININ, ED  I-STAT CG4 LACTIC ACID, ED    Imaging Review Dg Chest Port 1 View  02/09/2015   CLINICAL DATA:  Shortness of Breath  EXAM: PORTABLE CHEST - 1 VIEW  COMPARISON:  11/23/14  FINDINGS: Cardiac shadow is within normal limits. The lungs are well aerated bilaterally with mild interstitial changes. No focal infiltrate or sizable effusion is seen. No acute bony abnormality is noted.  IMPRESSION: No active disease.   Electronically Signed   By: Inez Catalina M.D.   On: 02/09/2015 20:42   Ct Renal Stone Study  02/09/2015   CLINICAL DATA:  Acute onset of generalized weakness and constipation. Question of hematochezia. Initial encounter.  EXAM: CT ABDOMEN AND PELVIS WITHOUT  CONTRAST  TECHNIQUE: Multidetector CT imaging of the abdomen and pelvis was performed following the standard protocol without IV contrast.  COMPARISON:  Renal ultrasound performed 08/27/2014  FINDINGS: Minimal bibasilar atelectasis is noted. Scattered coronary artery calcifications are seen. Trace pericardial fluid remains within normal limits.  Small hypodensities within the liver are nonspecific. A small calcified granuloma is noted near the gallbladder fossa, within the liver. The liver and spleen are otherwise unremarkable. A 8 mm hyperdense focus along the posterior aspect of the gallbladder may reflect a polyp. The pancreas and adrenal glands are grossly unremarkable.  There is minimal bilateral hydronephrosis, with mild prominence of the ureters, slightly more prominent on the left. No obstructing stone is seen distally. Mild bladder wall thickening is noted. This may reflect cystitis and possibly mild pyelonephritis. Scattered bilateral  renal cysts are seen, measuring up to 1.6 cm in size.  No free fluid is identified. The small bowel is unremarkable in appearance. The stomach is within normal limits. No acute vascular abnormalities are seen. Scattered calcification is seen along the abdominal aorta and its branches.  The appendix is not well characterized; there is no evidence of appendicitis. The colon is partially filled with stool. The rectum is distended to 7.1 cm in diameter, concerning for mild fecal impaction.  The bladder is mildly distended. Bladder wall thickening is noted as described above, somewhat irregular in appearance. Air is noted within the uterus, of uncertain significance. No suspicious adnexal masses are seen. No inguinal lymphadenopathy is seen.  No acute osseous abnormalities are identified. Multilevel vacuum phenomenon is noted along the lumbar spine.  IMPRESSION: 1. Minimal bilateral hydronephrosis, with mild prominence of the ureters. No obstructing stone seen distally. Mild  bladder wall thickening noted. This may reflect cystitis and possibly mild bilateral pyelonephritis. Would correlate for associated symptoms. 2. Bladder wall thickening is somewhat irregular in appearance. Cystoscopy would be helpful for further evaluation, as deemed clinically appropriate. 3. Rectum distended to 7.1 cm in diameter with stool, concerning for mild fecal impaction. Colon partially filled with stool. 4. Air noted within the uterus, of uncertain significance. Would correlate for associated symptoms. 5. Scattered coronary artery calcifications seen. 6. Nonspecific small hypodensities within the liver. Apparent 8 mm polyp along the posterior aspect of the gallbladder. 7. Scattered bilateral renal cysts seen. 8. Scattered calcification along the abdominal aorta and its branches. 9. Mild degenerative change along the lumbar spine.   Electronically Signed   By: Garald Balding M.D.   On: 02/09/2015 22:20     EKG Interpretation   Date/Time:  Sunday Feb 09 2015 20:00:43 EDT Ventricular Rate:  121 PR Interval:  202 QRS Duration: 97 QT Interval:  321 QTC Calculation: 455 R Axis:   41 Text Interpretation:  Sinus tachycardia Atrial premature complex  Borderline prolonged PR interval Consider right atrial enlargement LVH  with secondary repolarization abnormality Anterior infarct, old Confirmed  by Chrislyn Seedorf  MD, Amarise Lillo [phone]) on 02/09/2015 11:06:16 PM      MDM   Final diagnoses:  SOB (shortness of breath)  Pain  UTI (lower urinary tract infection)    Admit for uti       Milton Ferguson, MD 02/09/15 2323

## 2015-02-09 NOTE — ED Notes (Signed)
Witnessed in and out cath done by [facility]

## 2015-02-09 NOTE — ED Notes (Addendum)
Pt arrived to the ED with a complaint of weakness as well as possible constipation.  Pt states she has changed her depends twice for urinary incontinence which is baseline.  Pt family states she walks with a walker.  Pt states that she feels weak and has no appetite.  Pt family states her appetite has diminished. Pt's family states when she had a bowel movement there was blood on her rectum

## 2015-02-10 DIAGNOSIS — A419 Sepsis, unspecified organism: Secondary | ICD-10-CM

## 2015-02-10 DIAGNOSIS — N390 Urinary tract infection, site not specified: Secondary | ICD-10-CM

## 2015-02-10 DIAGNOSIS — R6520 Severe sepsis without septic shock: Secondary | ICD-10-CM

## 2015-02-10 DIAGNOSIS — E872 Acidosis, unspecified: Secondary | ICD-10-CM | POA: Diagnosis present

## 2015-02-10 DIAGNOSIS — G9340 Encephalopathy, unspecified: Secondary | ICD-10-CM | POA: Diagnosis present

## 2015-02-10 DIAGNOSIS — N10 Acute tubulo-interstitial nephritis: Secondary | ICD-10-CM | POA: Diagnosis present

## 2015-02-10 DIAGNOSIS — E440 Moderate protein-calorie malnutrition: Secondary | ICD-10-CM | POA: Insufficient documentation

## 2015-02-10 DIAGNOSIS — K5909 Other constipation: Secondary | ICD-10-CM

## 2015-02-10 DIAGNOSIS — K5900 Constipation, unspecified: Secondary | ICD-10-CM

## 2015-02-10 LAB — COMPREHENSIVE METABOLIC PANEL
ALBUMIN: 3.5 g/dL (ref 3.5–5.0)
ALT: 13 U/L — ABNORMAL LOW (ref 14–54)
AST: 23 U/L (ref 15–41)
Alkaline Phosphatase: 60 U/L (ref 38–126)
Anion gap: 7 (ref 5–15)
BILIRUBIN TOTAL: 0.9 mg/dL (ref 0.3–1.2)
BUN: 15 mg/dL (ref 6–20)
CO2: 24 mmol/L (ref 22–32)
CREATININE: 1.17 mg/dL — AB (ref 0.44–1.00)
Calcium: 8.3 mg/dL — ABNORMAL LOW (ref 8.9–10.3)
Chloride: 108 mmol/L (ref 101–111)
GFR, EST AFRICAN AMERICAN: 53 mL/min — AB (ref >60)
GFR, EST NON AFRICAN AMERICAN: 45 mL/min — AB (ref >60)
GLUCOSE: 126 mg/dL — AB (ref 65–99)
POTASSIUM: 3.3 mmol/L — AB (ref 3.5–5.1)
SODIUM: 139 mmol/L (ref 135–145)
Total Protein: 7.9 g/dL (ref 6.5–8.1)

## 2015-02-10 LAB — CBC WITH DIFFERENTIAL/PLATELET
BASOS ABS: 0 10*3/uL (ref 0.0–0.1)
Basophils Relative: 0 % (ref 0–1)
EOS ABS: 0 10*3/uL (ref 0.0–0.7)
Eosinophils Relative: 0 % (ref 0–5)
HEMATOCRIT: 27.8 % — AB (ref 36.0–46.0)
Hemoglobin: 8.8 g/dL — ABNORMAL LOW (ref 12.0–15.0)
Lymphocytes Relative: 5 % — ABNORMAL LOW (ref 12–46)
Lymphs Abs: 1 10*3/uL (ref 0.7–4.0)
MCH: 24 pg — AB (ref 26.0–34.0)
MCHC: 31.7 g/dL (ref 30.0–36.0)
MCV: 75.7 fL — AB (ref 78.0–100.0)
MONO ABS: 2.9 10*3/uL — AB (ref 0.1–1.0)
MONOS PCT: 14 % — AB (ref 3–12)
Neutro Abs: 16.6 10*3/uL — ABNORMAL HIGH (ref 1.7–7.7)
Neutrophils Relative %: 81 % — ABNORMAL HIGH (ref 43–77)
Platelets: 500 10*3/uL — ABNORMAL HIGH (ref 150–400)
RBC: 3.67 MIL/uL — ABNORMAL LOW (ref 3.87–5.11)
RDW: 25.3 % — ABNORMAL HIGH (ref 11.5–15.5)
WBC: 20.5 10*3/uL — ABNORMAL HIGH (ref 4.0–10.5)

## 2015-02-10 LAB — BLOOD GAS, ARTERIAL
Acid-base deficit: 0.9 mmol/L (ref 0.0–2.0)
BICARBONATE: 21.6 meq/L (ref 20.0–24.0)
Drawn by: 347621
FIO2: 0.21 %
O2 Saturation: 95.3 %
PCO2 ART: 32.9 mmHg — AB (ref 35.0–45.0)
PO2 ART: 85.6 mmHg (ref 80.0–100.0)
Patient temperature: 103.4
TCO2: 20.2 mmol/L (ref 0–100)
pH, Arterial: 7.446 (ref 7.350–7.450)

## 2015-02-10 LAB — I-STAT CG4 LACTIC ACID, ED: Lactic Acid, Venous: 6.01 mmol/L (ref 0.5–2.0)

## 2015-02-10 LAB — LACTIC ACID, PLASMA: Lactic Acid, Venous: 1.6 mmol/L (ref 0.5–2.0)

## 2015-02-10 LAB — TSH: TSH: 1.399 u[IU]/mL (ref 0.350–4.500)

## 2015-02-10 LAB — MRSA PCR SCREENING: MRSA by PCR: NEGATIVE

## 2015-02-10 LAB — PROCALCITONIN: Procalcitonin: 1.66 ng/mL

## 2015-02-10 LAB — APTT: aPTT: 29 seconds (ref 24–37)

## 2015-02-10 LAB — PROTIME-INR
INR: 1.28 (ref 0.00–1.49)
Prothrombin Time: 16.1 seconds — ABNORMAL HIGH (ref 11.6–15.2)

## 2015-02-10 MED ORDER — VANCOMYCIN HCL 500 MG IV SOLR
500.0000 mg | INTRAVENOUS | Status: DC
  Administered 2015-02-11: 500 mg via INTRAVENOUS
  Filled 2015-02-10: qty 500

## 2015-02-10 MED ORDER — BISACODYL 10 MG RE SUPP
10.0000 mg | Freq: Once | RECTAL | Status: AC
  Administered 2015-02-10: 10 mg via RECTAL
  Filled 2015-02-10: qty 1

## 2015-02-10 MED ORDER — HEPARIN SODIUM (PORCINE) 5000 UNIT/ML IJ SOLN
5000.0000 [IU] | Freq: Three times a day (TID) | INTRAMUSCULAR | Status: DC
  Administered 2015-02-10 – 2015-02-12 (×8): 5000 [IU] via SUBCUTANEOUS
  Filled 2015-02-10 (×8): qty 1

## 2015-02-10 MED ORDER — SODIUM CHLORIDE 0.9 % IV BOLUS (SEPSIS)
1000.0000 mL | Freq: Once | INTRAVENOUS | Status: AC
  Administered 2015-02-10: 1000 mL via INTRAVENOUS

## 2015-02-10 MED ORDER — POTASSIUM CHLORIDE CRYS ER 20 MEQ PO TBCR
40.0000 meq | EXTENDED_RELEASE_TABLET | Freq: Two times a day (BID) | ORAL | Status: AC
  Administered 2015-02-10 (×2): 40 meq via ORAL
  Filled 2015-02-10 (×2): qty 2

## 2015-02-10 MED ORDER — PIPERACILLIN-TAZOBACTAM 3.375 G IVPB
3.3750 g | Freq: Three times a day (TID) | INTRAVENOUS | Status: DC
  Administered 2015-02-10 – 2015-02-11 (×5): 3.375 g via INTRAVENOUS
  Filled 2015-02-10 (×6): qty 50

## 2015-02-10 MED ORDER — SODIUM CHLORIDE 0.9 % IJ SOLN
3.0000 mL | Freq: Two times a day (BID) | INTRAMUSCULAR | Status: DC
  Administered 2015-02-10 (×3): 3 mL via INTRAVENOUS

## 2015-02-10 MED ORDER — ACETAMINOPHEN 650 MG RE SUPP: 650.0000 mg | Freq: Four times a day (QID) | RECTAL | Status: DC | PRN

## 2015-02-10 MED ORDER — ACETAMINOPHEN 650 MG RE SUPP: 650.0000 mg | RECTAL | Status: DC | PRN

## 2015-02-10 MED ORDER — PIPERACILLIN-TAZOBACTAM 3.375 G IVPB 30 MIN
3.3750 g | Freq: Once | INTRAVENOUS | Status: AC
  Administered 2015-02-10: 3.375 g via INTRAVENOUS
  Filled 2015-02-10: qty 50

## 2015-02-10 MED ORDER — SENNOSIDES-DOCUSATE SODIUM 8.6-50 MG PO TABS
1.0000 | ORAL_TABLET | Freq: Two times a day (BID) | ORAL | Status: DC
  Administered 2015-02-10 – 2015-02-11 (×3): 1 via ORAL
  Filled 2015-02-10 (×3): qty 1

## 2015-02-10 MED ORDER — ENSURE ENLIVE PO LIQD
237.0000 mL | Freq: Three times a day (TID) | ORAL | Status: DC
  Administered 2015-02-10 – 2015-02-12 (×4): 237 mL via ORAL

## 2015-02-10 MED ORDER — ATORVASTATIN CALCIUM 10 MG PO TABS
10.0000 mg | ORAL_TABLET | Freq: Every day | ORAL | Status: DC
  Administered 2015-02-10 – 2015-02-12 (×3): 10 mg via ORAL
  Filled 2015-02-10 (×3): qty 1

## 2015-02-10 MED ORDER — ACETAMINOPHEN 325 MG PO TABS: 650.0000 mg | ORAL_TABLET | Freq: Four times a day (QID) | ORAL | Status: DC | PRN

## 2015-02-10 MED ORDER — SODIUM CHLORIDE 0.9 % IV BOLUS (SEPSIS)
500.0000 mL | INTRAVENOUS | Status: AC
  Administered 2015-02-10: 500 mL via INTRAVENOUS

## 2015-02-10 MED ORDER — ASPIRIN EC 81 MG PO TBEC
81.0000 mg | DELAYED_RELEASE_TABLET | Freq: Every day | ORAL | Status: DC
  Administered 2015-02-10 – 2015-02-12 (×3): 81 mg via ORAL
  Filled 2015-02-10 (×3): qty 1

## 2015-02-10 MED ORDER — ONDANSETRON HCL 4 MG PO TABS: 4.0000 mg | ORAL_TABLET | Freq: Four times a day (QID) | ORAL | Status: DC | PRN

## 2015-02-10 MED ORDER — ONDANSETRON HCL 4 MG/2ML IJ SOLN: 4.0000 mg | Freq: Four times a day (QID) | INTRAMUSCULAR | Status: DC | PRN

## 2015-02-10 MED ORDER — VANCOMYCIN HCL IN DEXTROSE 1-5 GM/200ML-% IV SOLN
1000.0000 mg | Freq: Once | INTRAVENOUS | Status: AC
  Administered 2015-02-10: 1000 mg via INTRAVENOUS
  Filled 2015-02-10: qty 200

## 2015-02-10 MED ORDER — ACETAMINOPHEN 325 MG PO TABS
650.0000 mg | ORAL_TABLET | Freq: Four times a day (QID) | ORAL | Status: DC | PRN
  Administered 2015-02-10: 650 mg via ORAL
  Filled 2015-02-10: qty 2

## 2015-02-10 NOTE — Progress Notes (Signed)
No changes from previous assessment. Pt aaox3 voice no complaints. Calling light within [place]. Husband at bedside

## 2015-02-10 NOTE — ED Notes (Signed)
Pt has a lactic of 6.01 I have notified hospitalist

## 2015-02-10 NOTE — Progress Notes (Signed)
Initial Nutrition Assessment  DOCUMENTATION CODES:  Non-severe (moderate) malnutrition in context of acute illness/injury  INTERVENTION: - Will order Ensure Enlive TID - RD to continue to monitor for needs  NUTRITION DIAGNOSIS:  Inadequate oral intake related to acute illness as evidenced by per patient/family report  GOAL:  Patient will meet greater than or equal to 90% of their needs  MONITOR:  PO intake, Supplement acceptance, Weight trends, Labs, I & O's  REASON FOR ASSESSMENT:  Malnutrition Screening Tool  ASSESSMENT: 74 y.o. female with PMH of CAD and HTN. The patient presents with complaints of generalized weakness that has been ongoing since last 1 week to 10 days. Patient has been having poor oral appetite as well as fatigue and lethargy and preferring to stay asleep since last one week. There was no nausea no vomiting. The patient has constipation and has not had bowel movement in several days.  Pt seen for MST with BMI WDL. Pt states she had a bite of pancake and a bite of grits with a few sips of tea this AM. She states this is more than she was eating previously and appetite has improved since admission. She states that her appetite has been poor since last Sunday (8 days) and that her UBW is 120 lbs but she is unsure the last time she weighed this much. Per weight hx review, pt has lost 7 lbs (6% body weight) in 10 months which is not significant for time frame. PTA she was drinking Ensure and had been encouraged to drink it TID. She states she felt stronger when drinking the supplement.  Pt not meeting needs. Severe muscle wasting to clavicle and temple but unable to perform full physical exam as tech in room preparing for pt to be taken out of room for testing. Will order Ensure TID. Medications reviewed. Labs reviewed; K: 3.3 mmol/L, creatinine elevated, GFR: 45.  Height:  Ht Readings from Last 1 Encounters:  02/10/15 5\' 2"  (1.575 m)    Weight:  Wt Readings  from Last 1 Encounters:  02/10/15 103 lb 2.8 oz (46.8 kg)    Ideal Body Weight:  50 kg (kg)  Wt Readings from Last 10 Encounters:  02/10/15 103 lb 2.8 oz (46.8 kg)  09/11/14 104 lb (47.174 kg)  08/27/14 103 lb 6.4 oz (46.902 kg)  04/09/14 110 lb 12.8 oz (50.259 kg)  03/23/14 107 lb 2.3 oz (48.6 kg)    BMI:  Body mass index is 18.87 kg/(m^2).  Estimated Nutritional Needs:  Kcal:  1150-1350  Protein:  55-70 grams  Fluid:  2-2.2L/day  Skin:  Reviewed, no issues  Diet Order:  Diet regular Room service appropriate?: Yes; Fluid consistency:: Thin  EDUCATION NEEDS:  No education needs identified at this time   Intake/Output Summary (Last 24 hours) at 02/10/15 1113 Last data filed at 02/10/15 0856  Gross per 24 hour  Intake    750 ml  Output      2 ml  Net    748 ml    Last BM:  5/30    Jarome Matin, RD, LDN Inpatient Clinical Dietitian Pager # [phone] After hours/weekend pager # [phone]

## 2015-02-10 NOTE — Progress Notes (Signed)
[facility] PROGRESS NOTE  Ashlee Mack TMH:[id number] DOB: 1941/04/13 DOA: 02/09/2015 PCP: Chari Manning, NP  Assessment/Plan: 1. Severe sepsis with bilateral pyelonephritis 1. Presenting leukocytosis, fevers, elevated lactate, mental status changes in setting of likely pyelonephritis 2. UA suggestive of UTI and ct abd with findings suggestive of B pyelonephritis 3. Clinically improving with vanc and zosyn 4. Pan cultures are pending, ultimately would de-escalate abx pending return of cultures 2. Constipation 1. Resolved with cathartic 3. Acute encephalopathy 1. Resolved with above tx for sepsis 4. Accelerated HTN 1. BP stable and now much better controlled 5. Lactic acidosis 1. Resolved with hydration 6. Moderate protein calorie malnutrition 1. Nutrition following 7. Hypokalemia 1. Replaced 2. Cont to follow and replace as needed 8. DVT prophylaxis 1. Heparin subQ  Code Status: Full Family Communication: Pt in room, husband at bedside Disposition Plan: Transfer to med-tele   Consultants:    Procedures:    Antibiotics:  Vancomycin 5/30>>>  Zosyn 5/30>>>  Rocephin 5/29 x 1 dose  HPI/Subjective: Feels better today. No complaints  Objective: Filed Vitals:   02/10/15 1000 02/10/15 1100 02/10/15 1109 02/10/15 1417  BP: 157/86 169/75  119/61  Pulse: 77 86  79  Temp:    98.4 F (36.9 C)  TempSrc:    Oral  Resp: 19 23  18   Height:   5\' 2"  (1.575 m)   Weight:   46.8 kg (103 lb 2.8 oz)   SpO2: 100% 97%  100%    Intake/Output Summary (Last 24 hours) at 02/10/15 1633 Last data filed at 02/10/15 1300  Gross per 24 hour  Intake   1110 ml  Output      2 ml  Net   1108 ml   Filed Weights   02/09/15 1926 02/10/15 0207 02/10/15 1109  Weight: 48.988 kg (108 lb) 46.8 kg (103 lb 2.8 oz) 46.8 kg (103 lb 2.8 oz)    Exam:   General:  Awake, in nad  Cardiovascular: regular, s1, s2  Respiratory: normal resp effort, no wheezing   Abdomen:  soft,nondistended  Musculoskeletal: perfused, no clubbing   Data Reviewed: Basic Metabolic Panel:  Recent Labs Lab 02/09/15 2027 02/09/15 2030 02/10/15 0450  NA 138 140 139  K 3.6 3.4* 3.3*  CL 101 100* 108  CO2 26  --  24  GLUCOSE 136* 137* 126*  BUN 19 18 15   CREATININE 1.27* 1.40* 1.17*  CALCIUM 9.4  --  8.3*   Liver Function Tests:  Recent Labs Lab 02/09/15 2027 02/10/15 0450  AST 33 23  ALT 16 13*  ALKPHOS 70 60  BILITOT 0.8 0.9  PROT 8.9* 7.9  ALBUMIN 4.1 3.5   No results for input(s): LIPASE, AMYLASE in the last 168 hours. No results for input(s): AMMONIA in the last 168 hours. CBC:  Recent Labs Lab 02/09/15 2017 02/09/15 2030 02/10/15 0450  WBC 16.1*  --  20.5*  NEUTROABS  --   --  16.6*  HGB 10.1* 11.9* 8.8*  HCT 31.4* 35.0* 27.8*  MCV 76.0*  --  75.7*  PLT 601*  --  500*   Cardiac Enzymes: No results for input(s): CKTOTAL, CKMB, CKMBINDEX, TROPONINI in the last 168 hours. BNP (last 3 results)  Recent Labs  02/09/15 2017  BNP 79.5    ProBNP (last 3 results) No results for input(s): PROBNP in the last 8760 hours.  CBG:  Recent Labs Lab 02/09/15 2004  GLUCAP 124*    Recent Results (from the past 240 hour(s))  MRSA  PCR Screening     Status: None   Collection Time: 02/10/15  1:36 AM  Result Value Ref Range Status   MRSA by PCR NEGATIVE NEGATIVE Final    Comment:        The GeneXpert MRSA Assay (FDA approved for NASAL specimens only), is one component of a comprehensive MRSA colonization surveillance program. It is not intended to diagnose MRSA infection nor to guide or monitor treatment for MRSA infections.      Studies: Dg Chest Port 1 View  02/09/2015   CLINICAL DATA:  Shortness of Breath  EXAM: PORTABLE CHEST - 1 VIEW  COMPARISON:  11/23/14  FINDINGS: Cardiac shadow is within normal limits. The lungs are well aerated bilaterally with mild interstitial changes. No focal infiltrate or sizable effusion is seen. No acute  bony abnormality is noted.  IMPRESSION: No active disease.   Electronically Signed   By: Inez Catalina M.D.   On: 02/09/2015 20:42   Ct Renal Stone Study  02/09/2015   CLINICAL DATA:  Acute onset of generalized weakness and constipation. Question of hematochezia. Initial encounter.  EXAM: CT ABDOMEN AND PELVIS WITHOUT CONTRAST  TECHNIQUE: Multidetector CT imaging of the abdomen and pelvis was performed following the standard protocol without IV contrast.  COMPARISON:  Renal ultrasound performed 08/27/2014  FINDINGS: Minimal bibasilar atelectasis is noted. Scattered coronary artery calcifications are seen. Trace pericardial fluid remains within normal limits.  Small hypodensities within the liver are nonspecific. A small calcified granuloma is noted near the gallbladder fossa, within the liver. The liver and spleen are otherwise unremarkable. A 8 mm hyperdense focus along the posterior aspect of the gallbladder may reflect a polyp. The pancreas and adrenal glands are grossly unremarkable.  There is minimal bilateral hydronephrosis, with mild prominence of the ureters, slightly more prominent on the left. No obstructing stone is seen distally. Mild bladder wall thickening is noted. This may reflect cystitis and possibly mild pyelonephritis. Scattered bilateral renal cysts are seen, measuring up to 1.6 cm in size.  No free fluid is identified. The small bowel is unremarkable in appearance. The stomach is within normal limits. No acute vascular abnormalities are seen. Scattered calcification is seen along the abdominal aorta and its branches.  The appendix is not well characterized; there is no evidence of appendicitis. The colon is partially filled with stool. The rectum is distended to 7.1 cm in diameter, concerning for mild fecal impaction.  The bladder is mildly distended. Bladder wall thickening is noted as described above, somewhat irregular in appearance. Air is noted within the uterus, of uncertain  significance. No suspicious adnexal masses are seen. No inguinal lymphadenopathy is seen.  No acute osseous abnormalities are identified. Multilevel vacuum phenomenon is noted along the lumbar spine.  IMPRESSION: 1. Minimal bilateral hydronephrosis, with mild prominence of the ureters. No obstructing stone seen distally. Mild bladder wall thickening noted. This may reflect cystitis and possibly mild bilateral pyelonephritis. Would correlate for associated symptoms. 2. Bladder wall thickening is somewhat irregular in appearance. Cystoscopy would be helpful for further evaluation, as deemed clinically appropriate. 3. Rectum distended to 7.1 cm in diameter with stool, concerning for mild fecal impaction. Colon partially filled with stool. 4. Air noted within the uterus, of uncertain significance. Would correlate for associated symptoms. 5. Scattered coronary artery calcifications seen. 6. Nonspecific small hypodensities within the liver. Apparent 8 mm polyp along the posterior aspect of the gallbladder. 7. Scattered bilateral renal cysts seen. 8. Scattered calcification along the abdominal aorta and its  branches. 9. Mild degenerative change along the lumbar spine.   Electronically Signed   By: Garald Balding M.D.   On: 02/09/2015 22:20    Scheduled Meds: . aspirin EC  81 mg Oral Daily  . atorvastatin  10 mg Oral Daily  . feeding supplement (ENSURE ENLIVE)  237 mL Oral TID BM  . heparin  5,000 Units Subcutaneous 3 times per day  . piperacillin-tazobactam (ZOSYN)  IV  3.375 g Intravenous 3 times per day  . potassium chloride  40 mEq Oral BID  . senna-docusate  1 tablet Oral BID  . sodium chloride  3 mL Intravenous Q12H  . [START ON 02/11/2015] vancomycin  500 mg Intravenous Q24H   Continuous Infusions:   Principal Problem:   Sepsis Active Problems:   Essential hypertension   Acute pyelonephritis   Constipation   Lactic acidosis   Acute encephalopathy   Malnutrition of moderate degree    Daman Steffenhagen,  Pansy Ostrovsky K  [facility] Pager [phone]. If 7PM-7AM, please contact night-coverage at [email or web address], password [facility] 02/10/2015, 4:33 PM  LOS: 1 day

## 2015-02-10 NOTE — H&P (Signed)
[facility] History and Physical  Patient: Ashlee Mack  MRN: [id number]  DOB: [birth year]  DOS: the patient was seen and examined on 02/10/2015 PCP: Chari Manning, NP  Referring physician: Milton Ferguson, MD Chief Complaint:  Generalized weakness  HPI: Ashlee Mack is a 74 y.o. female with Past medical history of  Coronary artery disease and hypertension.  the patient presents with complaints of generalized weakness that has been ongoing since last 1 week to 10 days. The history is obtained from patient's husband was at bedside as the patient is not providing any significant history, and prefers to remain asleep,  Although easily awoke with verbally cue. Patient has been having poor oral appetite as well as fatigue and lethargy and preferring to stay asleep since last one week,  There was no nausea no vomiting. The patient has constipation and has not had bowel movement in several days.  Patient was having some chills at the time of my evaluation and denied any acute complaint of chest pain or abdominal pain or headache or any other focal deficit.  there is no active bleeding reported. No prior similar episodes. No recent hospitalization.  patient is taking her regular medications on a regular basis. No recent change in her medications.  The patient is coming from home. And at her baseline independent for most of her ADL.  Review of Systems: as mentioned in the history of present illness.  A comprehensive review of the other systems is negative.  Past Medical History  Diagnosis Date  . Coronary artery disease   . Hypertension    Past Surgical History  Procedure Laterality Date  . Coronary angioplasty with stent placement     Social History:  reports that she has never smoked. She does not have any smokeless tobacco history on file. She reports that she does not drink alcohol or use illicit drugs.  No Known Allergies  Family History  Problem Relation Age of Onset  .  Hypertension Mother   . Hypertension Father     Prior to Admission medications   Medication Sig Start Date End Date Taking? Authorizing Provider  amLODipine (NORVASC) 10 MG tablet Take 1 tablet (10 mg total) by mouth daily. 04/16/14  Yes Lance Bosch, NP  aspirin EC 81 MG tablet Take 1 tablet (81 mg total) by mouth daily. 12/09/14  Yes Lance Bosch, NP  atorvastatin (LIPITOR) 10 MG tablet Take 1 tablet (10 mg total) by mouth daily. For cholesterol 09/12/14  Yes Lance Bosch, NP  cefUROXime (CEFTIN) 500 MG tablet Take 1 tablet (500 mg total) by mouth 2 (two) times daily with a meal. Patient not taking: Reported on 11/23/2014 08/29/14   Barton Dubois, MD  cephALEXin (KEFLEX) 500 MG capsule Take 1 capsule (500 mg total) by mouth 2 (two) times daily. Patient not taking: Reported on 11/23/2014 09/12/14   Lance Bosch, NP  feeding supplement, ENSURE COMPLETE, (ENSURE COMPLETE) LIQD Take 237 mLs by mouth 2 (two) times daily between meals. Patient not taking: Reported on 02/09/2015 08/29/14   Barton Dubois, MD    Physical Exam: Filed Vitals:   02/10/15 0031 02/10/15 0100 02/10/15 0200 02/10/15 0207  BP: 175/77 172/81 161/79   Pulse: 117  109   Temp: 103.4 F (39.7 C) 101.7 F (38.7 C)    TempSrc:  Oral    Resp: 18 24 24    Height:      Weight:    46.8 kg (103 lb 2.8 oz)  SpO2: 95%  97%     General:  Drowsy but easily awoke and Oriented to Time, Place and Person. Appear in marked distress Eyes: PERRL ENT: Oral Mucosa clear dry.  poor dentition Neck: no JVD Cardiovascular: S1 and S2 Present, no Murmur, Peripheral Pulses Present Respiratory: Bilateral Air entry equal and Decreased,  Clear to Auscultation, no Crackles, no wheezes Abdomen: Bowel Sound  present, Soft and non tender Skin: no Rash Extremities: no Pedal edema, no calf tenderness Neurologic: Grossly no focal neuro deficit other than lethargy  Labs on Admission:  CBC:  Recent Labs Lab 02/09/15 2017 02/09/15 2030    WBC 16.1*  --   HGB 10.1* 11.9*  HCT 31.4* 35.0*  MCV 76.0*  --   PLT 601*  --     CMP     Component Value Date/Time   NA 140 02/09/2015 2030   K 3.4* 02/09/2015 2030   CL 100* 02/09/2015 2030   CO2 26 02/09/2015 2027   GLUCOSE 137* 02/09/2015 2030   BUN 18 02/09/2015 2030   CREATININE 1.40* 02/09/2015 2030   CREATININE 1.19* 09/11/2014 1616   CALCIUM 9.4 02/09/2015 2027   PROT 8.9* 02/09/2015 2027   ALBUMIN 4.1 02/09/2015 2027   AST 33 02/09/2015 2027   ALT 16 02/09/2015 2027   ALKPHOS 70 02/09/2015 2027   BILITOT 0.8 02/09/2015 2027   GFRNONAA 41* 02/09/2015 2027   GFRNONAA 42* 04/09/2014 1628   GFRAA 48* 02/09/2015 2027   GFRAA 49* 04/09/2014 1628    No results for input(s): LIPASE, AMYLASE in the last 168 hours.  No results for input(s): CKTOTAL, CKMB, CKMBINDEX, TROPONINI in the last 168 hours. BNP (last 3 results)  Recent Labs  02/09/15 2017  BNP 79.5    ProBNP (last 3 results) No results for input(s): PROBNP in the last 8760 hours.   Radiological Exams on Admission: Dg Chest Port 1 View  02/09/2015   CLINICAL DATA:  Shortness of Breath  EXAM: PORTABLE CHEST - 1 VIEW  COMPARISON:  11/23/14  FINDINGS: Cardiac shadow is within normal limits. The lungs are well aerated bilaterally with mild interstitial changes. No focal infiltrate or sizable effusion is seen. No acute bony abnormality is noted.  IMPRESSION: No active disease.   Electronically Signed   By: Inez Catalina M.D.   On: 02/09/2015 20:42   Ct Renal Stone Study  02/09/2015   CLINICAL DATA:  Acute onset of generalized weakness and constipation. Question of hematochezia. Initial encounter.  EXAM: CT ABDOMEN AND PELVIS WITHOUT CONTRAST  TECHNIQUE: Multidetector CT imaging of the abdomen and pelvis was performed following the standard protocol without IV contrast.  COMPARISON:  Renal ultrasound performed 08/27/2014  FINDINGS: Minimal bibasilar atelectasis is noted. Scattered coronary artery calcifications  are seen. Trace pericardial fluid remains within normal limits.  Small hypodensities within the liver are nonspecific. A small calcified granuloma is noted near the gallbladder fossa, within the liver. The liver and spleen are otherwise unremarkable. A 8 mm hyperdense focus along the posterior aspect of the gallbladder may reflect a polyp. The pancreas and adrenal glands are grossly unremarkable.  There is minimal bilateral hydronephrosis, with mild prominence of the ureters, slightly more prominent on the left. No obstructing stone is seen distally. Mild bladder wall thickening is noted. This may reflect cystitis and possibly mild pyelonephritis. Scattered bilateral renal cysts are seen, measuring up to 1.6 cm in size.  No free fluid is identified. The small bowel is unremarkable in appearance. The stomach is within normal limits.  No acute vascular abnormalities are seen. Scattered calcification is seen along the abdominal aorta and its branches.  The appendix is not well characterized; there is no evidence of appendicitis. The colon is partially filled with stool. The rectum is distended to 7.1 cm in diameter, concerning for mild fecal impaction.  The bladder is mildly distended. Bladder wall thickening is noted as described above, somewhat irregular in appearance. Air is noted within the uterus, of uncertain significance. No suspicious adnexal masses are seen. No inguinal lymphadenopathy is seen.  No acute osseous abnormalities are identified. Multilevel vacuum phenomenon is noted along the lumbar spine.  IMPRESSION: 1. Minimal bilateral hydronephrosis, with mild prominence of the ureters. No obstructing stone seen distally. Mild bladder wall thickening noted. This may reflect cystitis and possibly mild bilateral pyelonephritis. Would correlate for associated symptoms. 2. Bladder wall thickening is somewhat irregular in appearance. Cystoscopy would be helpful for further evaluation, as deemed clinically  appropriate. 3. Rectum distended to 7.1 cm in diameter with stool, concerning for mild fecal impaction. Colon partially filled with stool. 4. Air noted within the uterus, of uncertain significance. Would correlate for associated symptoms. 5. Scattered coronary artery calcifications seen. 6. Nonspecific small hypodensities within the liver. Apparent 8 mm polyp along the posterior aspect of the gallbladder. 7. Scattered bilateral renal cysts seen. 8. Scattered calcification along the abdominal aorta and its branches. 9. Mild degenerative change along the lumbar spine.   Electronically Signed   By: Garald Balding M.D.   On: 02/09/2015 22:20   Assessment/Plan Principal Problem:   Sepsis Active Problems:   Essential hypertension   Acute pyelonephritis   Constipation   Lactic acidosis   Acute encephalopathy   1. Sepsis  the patient is presenting with complaints of generalized weakness.  workup shows that she has evidence of pyuria with fever as well as CT scan showing bilateral pyelonephritis. Patient was initially communicating and was awake and while in the ED she progressed to becoming more drowsy and lethargic.  She was initially brought to be treated with ceftriaxone but with her change in mental status she will be treated with vancomycin and Zosyn as an aggressive treatment.  I will obtain blood cultures as well as further urine cultures.  give her IV bolus as well as monitor her on telemetry.  follow the workup.  2. Constipation. Will use suppository and if not effective enema. We will also use MiraLAX and senna for starting tomorrow.  3. Acute encephalopathy Likely secondary to sepsis does not appear to have any acute neurological deficit on exam.  continue close monitoring check ABG check TSH.  4. Accelerated hypertension.  closely monitoring off of her blood pressure medications.  patient will benefit from switching from Norvasc secondary to its effect on bowel movement with her  constipation.  5. Lactic acidosis likely secondary to sepsis. Does not have any of abdominal tenderness. CT scan is negative for any acute all issues. Continue close monitoring.   Advance goals of care discussion:  Full code   DVT Prophylaxis: subcutaneous Heparin Nutrition:  Nothing by mouth  Family Communication: family was present at bedside, opportunity was given to ask question and all questions were answered satisfactorily at the time of interview. Disposition: Admitted as inpatient, step-down unit.  Author: Berle Mull, MD [facility] Pager: [phone] 02/10/2015  If 7PM-7AM, please contact night-coverage [email or web address] Password [email or web address]

## 2015-02-10 NOTE — Progress Notes (Signed)
ANTIBIOTIC CONSULT NOTE - INITIAL  Pharmacy Consult for Vancomycin and Zosyn  Indication: Sepsis/UTI  No Known Allergies  Patient Measurements: Height: 5\' 2"  (157.5 cm) Weight: 103 lb 2.8 oz (46.8 kg) IBW/kg (Calculated) : 50.1 Adjusted Body Weight:   Vital Signs: Temp: 103.4 F (39.7 C) (05/30 0031) Temp Source: Oral (05/29 1926) BP: 175/77 mmHg (05/30 0031) Pulse Rate: 117 (05/30 0031) Intake/Output from previous day:   Intake/Output from this shift:    Labs:  Recent Labs  02/09/15 2017 02/09/15 2027 02/09/15 2030  WBC 16.1*  --   --   HGB 10.1*  --  11.9*  PLT 601*  --   --   CREATININE  --  1.27* 1.40*   Estimated Creatinine Clearance: 26.8 mL/min (by C-G formula based on Cr of 1.4). No results for input(s): VANCOTROUGH, VANCOPEAK, VANCORANDOM, GENTTROUGH, GENTPEAK, GENTRANDOM, TOBRATROUGH, TOBRAPEAK, TOBRARND, AMIKACINPEAK, AMIKACINTROU, AMIKACIN in the last 72 hours.   Microbiology: No results found for this or any previous visit (from the past 720 hour(s)).  Medical History: Past Medical History  Diagnosis Date  . Coronary artery disease   . Hypertension     Medications:  Anti-infectives    Start     Dose/Rate Route Frequency Ordered Stop   02/11/15 0600  vancomycin (VANCOCIN) 500 mg in sodium chloride 0.9 % 100 mL IVPB     500 mg 100 mL/hr over 60 Minutes Intravenous Every 24 hours 02/10/15 0214     02/10/15 0600  piperacillin-tazobactam (ZOSYN) IVPB 3.375 g     3.375 g 12.5 mL/hr over 240 Minutes Intravenous 3 times per day 02/10/15 0214     02/10/15 0030  piperacillin-tazobactam (ZOSYN) IVPB 3.375 g     3.375 g 100 mL/hr over 30 Minutes Intravenous  Once 02/10/15 0019 02/10/15 0122   02/10/15 0030  vancomycin (VANCOCIN) IVPB 1000 mg/200 mL premix     1,000 mg 200 mL/hr over 60 Minutes Intravenous  Once 02/10/15 0019     02/09/15 2145  cefTRIAXone (ROCEPHIN) 1 g in dextrose 5 % 50 mL IVPB     1 g Intravenous  Once 02/09/15 2145 02/10/15 0023      Assessment: Patient with sepsis/UTI.  First dose of antibiotics already given.  Patient with poor renal function.  Goal of Therapy:  Vancomycin trough level 15-20 mcg/ml  Zosyn based on renal function   Plan:  Measure antibiotic drug levels at steady state Follow up culture results Vancomycin 500mg  iv q24hr  Zosyn 3.375g IV Q8H infused over 4hrs.   Tyler Deis, Shea Stakes Crowford 02/10/2015,2:14 AM

## 2015-02-11 DIAGNOSIS — I10 Essential (primary) hypertension: Secondary | ICD-10-CM

## 2015-02-11 DIAGNOSIS — K5901 Slow transit constipation: Secondary | ICD-10-CM

## 2015-02-11 LAB — BASIC METABOLIC PANEL
ANION GAP: 9 (ref 5–15)
BUN: 17 mg/dL (ref 6–20)
CALCIUM: 8.6 mg/dL — AB (ref 8.9–10.3)
CO2: 23 mmol/L (ref 22–32)
CREATININE: 1.27 mg/dL — AB (ref 0.44–1.00)
Chloride: 108 mmol/L (ref 101–111)
GFR calc Af Amer: 48 mL/min — ABNORMAL LOW (ref >60)
GFR, EST NON AFRICAN AMERICAN: 41 mL/min — AB (ref >60)
Glucose, Bld: 95 mg/dL (ref 65–99)
Potassium: 4.4 mmol/L (ref 3.5–5.1)
Sodium: 140 mmol/L (ref 135–145)

## 2015-02-11 LAB — CBC
HCT: 26.5 % — ABNORMAL LOW (ref 36.0–46.0)
Hemoglobin: 8.4 g/dL — ABNORMAL LOW (ref 12.0–15.0)
MCH: 23.7 pg — ABNORMAL LOW (ref 26.0–34.0)
MCHC: 31.7 g/dL (ref 30.0–36.0)
MCV: 74.9 fL — ABNORMAL LOW (ref 78.0–100.0)
Platelets: 435 10*3/uL — ABNORMAL HIGH (ref 150–400)
RBC: 3.54 MIL/uL — ABNORMAL LOW (ref 3.87–5.11)
RDW: 25.1 % — ABNORMAL HIGH (ref 11.5–15.5)
WBC: 11.3 10*3/uL — ABNORMAL HIGH (ref 4.0–10.5)

## 2015-02-11 MED ORDER — CIPROFLOXACIN IN D5W 400 MG/200ML IV SOLN
400.0000 mg | INTRAVENOUS | Status: DC
  Administered 2015-02-11: 400 mg via INTRAVENOUS
  Filled 2015-02-11: qty 200

## 2015-02-11 NOTE — Progress Notes (Signed)
[facility] PROGRESS NOTE  Ashlee Mack QQV:[id number] DOB: June 12, 1941 DOA: 02/09/2015 PCP: Chari Manning, NP  Assessment/Plan: 1. Severe sepsis with bilateral pyelonephritis with E.coli UTI 1. Presenting leukocytosis, fevers, elevated lactate, mental status changes in setting of likely pyelonephritis 2. UA suggestive of UTI and ct abd with findings suggestive of B pyelonephritis 3. Clinically improving with vanc and zosyn 4. Urine cx has grown Ecoli. Awaiting sensitivities 5. Of note, pt had failed ceftin and keflex as an outpatient so will cont on zosyn pending sensitivities 2. Constipation 1. Resolved with cathartic 3. Acute encephalopathy 1. Resolved with above tx for sepsis 4. Accelerated HTN 1. BP stable and now much better controlled 5. Lactic acidosis 1. Resolved with hydration 6. Moderate protein calorie malnutrition 1. Nutrition following 7. Hypokalemia 1. Replaced 2. Cont to follow and replace as needed 8. DVT prophylaxis 1. Heparin subQ 9. Weakness 1. Will consult PT/OT for dispo recs 2. Pt lives in apartment with two sets of stairs w/ her husband  Code Status: Full Family Communication: Pt in room, husband at bedside Disposition Plan: Dispo pending PT/OT recs   Consultants:    Procedures:    Antibiotics:  Vancomycin 5/30>>>5/31  Zosyn 5/30>>>  Rocephin 5/29 x 1 dose  HPI/Subjective: States feeling better today  Objective: Filed Vitals:   02/10/15 1417 02/10/15 2045 02/11/15 0525 02/11/15 1423  BP: 119/61 124/72 140/71 145/82  Pulse: 79 87 70 92  Temp: 98.4 F (36.9 C) 98.8 F (37.1 C) 98.6 F (37 C) 98.7 F (37.1 C)  TempSrc: Oral Oral Oral Oral  Resp: 18 18 18 20   Height:      Weight:   46.4 kg (102 lb 4.7 oz)   SpO2: 100% 100% 100% 100%    Intake/Output Summary (Last 24 hours) at 02/11/15 1446 Last data filed at 02/11/15 0630  Gross per 24 hour  Intake      0 ml  Output      4 ml  Net     -4 ml   Filed Weights   02/10/15 0207 02/10/15 1109 02/11/15 0525  Weight: 46.8 kg (103 lb 2.8 oz) 46.8 kg (103 lb 2.8 oz) 46.4 kg (102 lb 4.7 oz)    Exam:   General:  Awake, in nad, eating lunch  Cardiovascular: regular, s1, s2  Respiratory: normal resp effort, no wheezing   Abdomen: soft,nondistended  Musculoskeletal: perfused, no clubbing   Data Reviewed: Basic Metabolic Panel:  Recent Labs Lab 02/09/15 2027 02/09/15 2030 02/10/15 0450 02/11/15 0442  NA 138 140 139 140  K 3.6 3.4* 3.3* 4.4  CL 101 100* 108 108  CO2 26  --  24 23  GLUCOSE 136* 137* 126* 95  BUN 19 18 15 17   CREATININE 1.27* 1.40* 1.17* 1.27*  CALCIUM 9.4  --  8.3* 8.6*   Liver Function Tests:  Recent Labs Lab 02/09/15 2027 02/10/15 0450  AST 33 23  ALT 16 13*  ALKPHOS 70 60  BILITOT 0.8 0.9  PROT 8.9* 7.9  ALBUMIN 4.1 3.5   No results for input(s): LIPASE, AMYLASE in the last 168 hours. No results for input(s): AMMONIA in the last 168 hours. CBC:  Recent Labs Lab 02/09/15 2017 02/09/15 2030 02/10/15 0450 02/11/15 0442  WBC 16.1*  --  20.5* 11.3*  NEUTROABS  --   --  16.6*  --   HGB 10.1* 11.9* 8.8* 8.4*  HCT 31.4* 35.0* 27.8* 26.5*  MCV 76.0*  --  75.7* 74.9*  PLT 601*  --  500* 435*   Cardiac Enzymes: No results for input(s): CKTOTAL, CKMB, CKMBINDEX, TROPONINI in the last 168 hours. BNP (last 3 results)  Recent Labs  02/09/15 2017  BNP 79.5    ProBNP (last 3 results) No results for input(s): PROBNP in the last 8760 hours.  CBG:  Recent Labs Lab 02/09/15 2004  GLUCAP 124*    Recent Results (from the past 240 hour(s))  Urine culture     Status: None (Preliminary result)   Collection Time: 02/09/15  8:51 PM  Result Value Ref Range Status   Specimen Description URINE, RANDOM  Final   Special Requests NONE  Final   Colony Count   Final    >=100,000 COLONIES/ML Performed at [facility]    Culture   Final    ESCHERICHIA COLI Performed at [facility]    Report  Status PENDING  Incomplete  Culture, blood (x 2)     Status: None (Preliminary result)   Collection Time: 02/10/15 12:49 AM  Result Value Ref Range Status   Specimen Description BLOOD RIGHT ANTECUBITAL  Final   Special Requests BOTTLES DRAWN AEROBIC AND ANAEROBIC 5CC  Final   Culture   Final           BLOOD CULTURE RECEIVED NO GROWTH TO DATE CULTURE WILL BE HELD FOR 5 DAYS BEFORE ISSUING A FINAL NEGATIVE REPORT Performed at [facility]    Report Status PENDING  Incomplete  Culture, blood (x 2)     Status: None (Preliminary result)   Collection Time: 02/10/15 12:50 AM  Result Value Ref Range Status   Specimen Description BLOOD RIGHT WRIST  Final   Special Requests BOTTLES DRAWN AEROBIC AND ANAEROBIC 5CC  Final   Culture   Final           BLOOD CULTURE RECEIVED NO GROWTH TO DATE CULTURE WILL BE HELD FOR 5 DAYS BEFORE ISSUING A FINAL NEGATIVE REPORT Performed at [facility]    Report Status PENDING  Incomplete  MRSA PCR Screening     Status: None   Collection Time: 02/10/15  1:36 AM  Result Value Ref Range Status   MRSA by PCR NEGATIVE NEGATIVE Final    Comment:        The GeneXpert MRSA Assay (FDA approved for NASAL specimens only), is one component of a comprehensive MRSA colonization surveillance program. It is not intended to diagnose MRSA infection nor to guide or monitor treatment for MRSA infections.      Studies: Dg Chest Port 1 View  02/09/2015   CLINICAL DATA:  Shortness of Breath  EXAM: PORTABLE CHEST - 1 VIEW  COMPARISON:  11/23/14  FINDINGS: Cardiac shadow is within normal limits. The lungs are well aerated bilaterally with mild interstitial changes. No focal infiltrate or sizable effusion is seen. No acute bony abnormality is noted.  IMPRESSION: No active disease.   Electronically Signed   By: Inez Catalina M.D.   On: 02/09/2015 20:42   Ct Renal Stone Study  02/09/2015   CLINICAL DATA:  Acute onset of generalized weakness and constipation.  Question of hematochezia. Initial encounter.  EXAM: CT ABDOMEN AND PELVIS WITHOUT CONTRAST  TECHNIQUE: Multidetector CT imaging of the abdomen and pelvis was performed following the standard protocol without IV contrast.  COMPARISON:  Renal ultrasound performed 08/27/2014  FINDINGS: Minimal bibasilar atelectasis is noted. Scattered coronary artery calcifications are seen. Trace pericardial fluid remains within normal limits.  Small hypodensities within the liver are nonspecific. A small  calcified granuloma is noted near the gallbladder fossa, within the liver. The liver and spleen are otherwise unremarkable. A 8 mm hyperdense focus along the posterior aspect of the gallbladder may reflect a polyp. The pancreas and adrenal glands are grossly unremarkable.  There is minimal bilateral hydronephrosis, with mild prominence of the ureters, slightly more prominent on the left. No obstructing stone is seen distally. Mild bladder wall thickening is noted. This may reflect cystitis and possibly mild pyelonephritis. Scattered bilateral renal cysts are seen, measuring up to 1.6 cm in size.  No free fluid is identified. The small bowel is unremarkable in appearance. The stomach is within normal limits. No acute vascular abnormalities are seen. Scattered calcification is seen along the abdominal aorta and its branches.  The appendix is not well characterized; there is no evidence of appendicitis. The colon is partially filled with stool. The rectum is distended to 7.1 cm in diameter, concerning for mild fecal impaction.  The bladder is mildly distended. Bladder wall thickening is noted as described above, somewhat irregular in appearance. Air is noted within the uterus, of uncertain significance. No suspicious adnexal masses are seen. No inguinal lymphadenopathy is seen.  No acute osseous abnormalities are identified. Multilevel vacuum phenomenon is noted along the lumbar spine.  IMPRESSION: 1. Minimal bilateral hydronephrosis,  with mild prominence of the ureters. No obstructing stone seen distally. Mild bladder wall thickening noted. This may reflect cystitis and possibly mild bilateral pyelonephritis. Would correlate for associated symptoms. 2. Bladder wall thickening is somewhat irregular in appearance. Cystoscopy would be helpful for further evaluation, as deemed clinically appropriate. 3. Rectum distended to 7.1 cm in diameter with stool, concerning for mild fecal impaction. Colon partially filled with stool. 4. Air noted within the uterus, of uncertain significance. Would correlate for associated symptoms. 5. Scattered coronary artery calcifications seen. 6. Nonspecific small hypodensities within the liver. Apparent 8 mm polyp along the posterior aspect of the gallbladder. 7. Scattered bilateral renal cysts seen. 8. Scattered calcification along the abdominal aorta and its branches. 9. Mild degenerative change along the lumbar spine.   Electronically Signed   By: Garald Balding M.D.   On: 02/09/2015 22:20    Scheduled Meds: . aspirin EC  81 mg Oral Daily  . atorvastatin  10 mg Oral Daily  . feeding supplement (ENSURE ENLIVE)  237 mL Oral TID BM  . heparin  5,000 Units Subcutaneous 3 times per day  . piperacillin-tazobactam (ZOSYN)  IV  3.375 g Intravenous 3 times per day  . senna-docusate  1 tablet Oral BID  . sodium chloride  3 mL Intravenous Q12H   Continuous Infusions:   Principal Problem:   Sepsis secondary to UTI Active Problems:   Sepsis   Essential hypertension   Acute pyelonephritis   Constipation   Lactic acidosis   Acute encephalopathy   Malnutrition of moderate degree   Severe sepsis    CHIU, STEPHEN K  [facility] Pager [phone]. If 7PM-7AM, please contact night-coverage at [email or web address], password [facility] 02/11/2015, 2:46 PM  LOS: 2 days

## 2015-02-11 NOTE — Progress Notes (Signed)
ANTIBIOTIC CONSULT NOTE - INITIAL  Pharmacy Consult for Cipro Indication: UTI  No Known Allergies  Patient Measurements: Height: 5\' 2"  (157.5 cm) Weight: 102 lb 4.7 oz (46.4 kg) IBW/kg (Calculated) : 50.1 Adjusted Body Weight:   Vital Signs: Temp: 98.7 F (37.1 C) (05/31 1423) Temp Source: Oral (05/31 1423) BP: 145/82 mmHg (05/31 1423) Pulse Rate: 92 (05/31 1423) Intake/Output from previous day: 05/30 0701 - 05/31 0700 In: 360 [P.O.:360] Out: 6 [Urine:5; Stool:1] Intake/Output from this shift:    Labs:  Recent Labs  02/09/15 2017  02/09/15 2030 02/10/15 0450 02/11/15 0442  WBC 16.1*  --   --  20.5* 11.3*  HGB 10.1*  --  11.9* 8.8* 8.4*  PLT 601*  --   --  500* 435*  CREATININE  --   < > 1.40* 1.17* 1.27*  < > = values in this interval not displayed. Estimated Creatinine Clearance: 29.3 mL/min (by C-G formula based on Cr of 1.27). No results for input(s): VANCOTROUGH, VANCOPEAK, VANCORANDOM, GENTTROUGH, GENTPEAK, GENTRANDOM, TOBRATROUGH, TOBRAPEAK, TOBRARND, AMIKACINPEAK, AMIKACINTROU, AMIKACIN in the last 72 hours.   Microbiology: Recent Results (from the past 720 hour(s))  Urine culture     Status: None (Preliminary result)   Collection Time: 02/09/15  8:51 PM  Result Value Ref Range Status   Specimen Description URINE, RANDOM  Final   Special Requests NONE  Final   Colony Count   Final    >=100,000 COLONIES/ML Performed at [facility]    Culture   Final    ESCHERICHIA COLI Performed at [facility]    Report Status PENDING  Incomplete  Culture, blood (x 2)     Status: None (Preliminary result)   Collection Time: 02/10/15 12:49 AM  Result Value Ref Range Status   Specimen Description BLOOD RIGHT ANTECUBITAL  Final   Special Requests BOTTLES DRAWN AEROBIC AND ANAEROBIC 5CC  Final   Culture   Final           BLOOD CULTURE RECEIVED NO GROWTH TO DATE CULTURE WILL BE HELD FOR 5 DAYS BEFORE ISSUING A FINAL NEGATIVE REPORT Performed at  [facility]    Report Status PENDING  Incomplete  Culture, blood (x 2)     Status: None (Preliminary result)   Collection Time: 02/10/15 12:50 AM  Result Value Ref Range Status   Specimen Description BLOOD RIGHT WRIST  Final   Special Requests BOTTLES DRAWN AEROBIC AND ANAEROBIC 5CC  Final   Culture   Final           BLOOD CULTURE RECEIVED NO GROWTH TO DATE CULTURE WILL BE HELD FOR 5 DAYS BEFORE ISSUING A FINAL NEGATIVE REPORT Performed at [facility]    Report Status PENDING  Incomplete  MRSA PCR Screening     Status: None   Collection Time: 02/10/15  1:36 AM  Result Value Ref Range Status   MRSA by PCR NEGATIVE NEGATIVE Final    Comment:        The GeneXpert MRSA Assay (FDA approved for NASAL specimens only), is one component of a comprehensive MRSA colonization surveillance program. It is not intended to diagnose MRSA infection nor to guide or monitor treatment for MRSA infections.     Medical History: Past Medical History  Diagnosis Date  . Coronary artery disease   . Hypertension     Medications:  Anti-infectives    Start     Dose/Rate Route Frequency Ordered Stop   02/11/15 0600  vancomycin (VANCOCIN)  500 mg in sodium chloride 0.9 % 100 mL IVPB  Status:  Discontinued     500 mg 100 mL/hr over 60 Minutes Intravenous Every 24 hours 02/10/15 0214 02/11/15 0747   02/10/15 0600  piperacillin-tazobactam (ZOSYN) IVPB 3.375 g  Status:  Discontinued     3.375 g 12.5 mL/hr over 240 Minutes Intravenous 3 times per day 02/10/15 0214 02/11/15 1630   02/10/15 0030  piperacillin-tazobactam (ZOSYN) IVPB 3.375 g     3.375 g 100 mL/hr over 30 Minutes Intravenous  Once 02/10/15 0019 02/10/15 0122   02/10/15 0030  vancomycin (VANCOCIN) IVPB 1000 mg/200 mL premix     1,000 mg 200 mL/hr over 60 Minutes Intravenous  Once 02/10/15 0019 02/10/15 0215   02/09/15 2145  cefTRIAXone (ROCEPHIN) 1 g in dextrose 5 % 50 mL IVPB     1 g Intravenous  Once 02/09/15 2145  02/10/15 0023     Assessment: Patient admitted on 5/30 with sepsis/UTI and pharmacy consulted to dose Vancomycin and Zosyn.  Urine culture growing E.Coli and Vanc/Zosyn d/c'ed today and pharmacy consulted to dose Cipro for treatment.    CrCl ~ 29 ml/min  Urine culture : E. Coli  WBC 20.5 --> 11.3  Last dose of Zosyn received today @ 15:24  Goal of Therapy:  Vancomycin trough level 15-20 mcg/ml  Zosyn based on renal function   Plan:   F/U culture sensitivities   Cipro 400mg  IV q24h   Blayne Frankie, Toribio Harbour, PharmD 02/11/2015,4:36 PM

## 2015-02-11 NOTE — Evaluation (Signed)
Physical Therapy Evaluation Patient Details Name: Maeley Matton MRN: [id number] DOB: 1941-01-15 Today's Date: 02/11/2015   History of Present Illness  74 yo female admitted with sepsis secondary to UTI, weakness.   Clinical Impression  On eval, pt required Min guard assist for mobility-able to ambulate ~85 feet with RW. Increased time to complete tasks. Recommend HHPT as long as family can continue to provide some assist. Pt could benefit from HHA to assist is home as well.     Follow Up Recommendations Home health PT;Supervision/Assistance - 24 hour (Home health Aide)    Equipment Recommendations  None recommended by PT    Recommendations for Other Services OT consult     Precautions / Restrictions Precautions Precautions: Fall Restrictions Weight Bearing Restrictions: No      Mobility  Bed Mobility Overal bed mobility: Needs Assistance Bed Mobility: Supine to Sit     Supine to sit: Supervision     General bed mobility comments: for IV/line  Transfers Overall transfer level: Needs assistance Equipment used: Rolling walker (2 wheeled) Transfers: Sit to/from [facility] Sit to Stand: Min guard Stand pivot transfers: Min guard       General transfer comment: close guard for safety. VCs safety, hand placement  Ambulation/Gait Ambulation/Gait assistance: Min guard Ambulation Distance (Feet): 85 Feet Assistive device: Rolling walker (2 wheeled) Gait Pattern/deviations: Step-through pattern;Decreased stride length;Trunk flexed     General Gait Details: slow gait speed. close guard for safety. VCs safety, distance from RW.   Stairs            Wheelchair Mobility    Modified Rankin (Stroke Patients Only)       Balance                                             Pertinent Vitals/Pain Pain Assessment: No/denies pain    Home Living Family/patient expects to be discharged to:: Private residence Living Arrangements:  Spouse/significant other   Type of Home: Apartment Home Access: Stairs to enter Entrance Stairs-Rails: Right Entrance Stairs-Number of Steps: 12 Home Layout: One level Home Equipment: Environmental consultant - 2 wheels      Prior Function Level of Independence: Needs assistance               Hand Dominance        Extremity/Trunk Assessment   Upper Extremity Assessment: Defer to OT evaluation           Lower Extremity Assessment: Generalized weakness      Cervical / Trunk Assessment: Kyphotic  Communication      Cognition Arousal/Alertness: Awake/alert Behavior During Therapy: WFL for tasks assessed/performed Overall Cognitive Status:  (seems a little confused at times but participated well with session)                      General Comments      Exercises        Assessment/Plan    PT Assessment Patient needs continued PT services  PT Diagnosis Difficulty walking;Generalized weakness   PT Problem List Decreased strength;Decreased activity tolerance;Decreased balance;Decreased mobility;Decreased safety awareness;Decreased knowledge of use of DME;Decreased cognition  PT Treatment Interventions DME instruction;Gait training;Functional mobility training;Therapeutic activities;Therapeutic exercise;Patient/family education;Balance training   PT Goals (Current goals can be found in the Care Plan section) Acute Rehab PT Goals Patient Stated Goal: none stated PT Goal Formulation: With patient/family  Time For Goal Achievement: 02/25/15 Potential to Achieve Goals: Good    Frequency Min 3X/week   Barriers to discharge        Co-evaluation               End of Session Equipment Utilized During Treatment: Gait belt Activity Tolerance: Patient tolerated treatment well Patient left: in chair;with call bell/phone within reach;with chair alarm set;with family/visitor present           Time: 9983-3825 PT Time Calculation (min) (ACUTE ONLY): 24  min   Charges:   PT Evaluation $Initial PT Evaluation Tier I: 1 Procedure PT Treatments $Gait Training: 8-22 mins   PT G Codes:        Weston Anna, MPT Pager: [phone]

## 2015-02-12 DIAGNOSIS — E440 Moderate protein-calorie malnutrition: Secondary | ICD-10-CM

## 2015-02-12 DIAGNOSIS — G9340 Encephalopathy, unspecified: Secondary | ICD-10-CM

## 2015-02-12 DIAGNOSIS — N10 Acute tubulo-interstitial nephritis: Secondary | ICD-10-CM

## 2015-02-12 DIAGNOSIS — N390 Urinary tract infection, site not specified: Secondary | ICD-10-CM

## 2015-02-12 DIAGNOSIS — A419 Sepsis, unspecified organism: Principal | ICD-10-CM

## 2015-02-12 DIAGNOSIS — L8990 Pressure ulcer of unspecified site, unspecified stage: Secondary | ICD-10-CM | POA: Insufficient documentation

## 2015-02-12 LAB — BASIC METABOLIC PANEL
ANION GAP: 11 (ref 5–15)
BUN: 23 mg/dL — AB (ref 6–20)
CALCIUM: 8.9 mg/dL (ref 8.9–10.3)
CO2: 24 mmol/L (ref 22–32)
CREATININE: 1.4 mg/dL — AB (ref 0.44–1.00)
Chloride: 104 mmol/L (ref 101–111)
GFR calc Af Amer: 42 mL/min — ABNORMAL LOW (ref >60)
GFR, EST NON AFRICAN AMERICAN: 37 mL/min — AB (ref >60)
GLUCOSE: 103 mg/dL — AB (ref 65–99)
POTASSIUM: 4.1 mmol/L (ref 3.5–5.1)
Sodium: 139 mmol/L (ref 135–145)

## 2015-02-12 LAB — CBC
HEMATOCRIT: 26.5 % — AB (ref 36.0–46.0)
Hemoglobin: 8.5 g/dL — ABNORMAL LOW (ref 12.0–15.0)
MCH: 24 pg — ABNORMAL LOW (ref 26.0–34.0)
MCHC: 32.1 g/dL (ref 30.0–36.0)
MCV: 74.9 fL — ABNORMAL LOW (ref 78.0–100.0)
Platelets: 479 10*3/uL — ABNORMAL HIGH (ref 150–400)
RBC: 3.54 MIL/uL — ABNORMAL LOW (ref 3.87–5.11)
RDW: 25.2 % — AB (ref 11.5–15.5)
WBC: 7.5 10*3/uL (ref 4.0–10.5)

## 2015-02-12 LAB — URINE CULTURE: Colony Count: >=100000

## 2015-02-12 MED ORDER — CIPROFLOXACIN HCL 250 MG PO TABS: 250.0000 mg | ORAL_TABLET | Freq: Two times a day (BID) | ORAL | Status: DC

## 2015-02-12 NOTE — Progress Notes (Signed)
Pt referred me to her son Carmencita Cusic [phone] for [facility]  He had no preference [facility] was called.

## 2015-02-12 NOTE — Care Management Note (Signed)
Case Management Note  Patient Details  Name: Ashlee Mack MRN: [id number] Date of Birth: November 19, 1940  Subjective/Objective:  Pt admitted with sepsis secondary to  UTI.              Action/Plan: Home with [facility]   Expected Discharge Date:                  Expected Discharge Plan:  [name]  In-House Referral:  NA  Discharge planning Services  CM Consult  Post Acute Care Choice:    Choice offered to:  Patient, Adult Children  DME Arranged:    DME Agency:     HH Arranged:  RN, PT, OT, Nurse's Aide [place] Agency:  [facility]  Status of Service:     Medicare Important Message Given:    Date Medicare IM Given:    Medicare IM give by:    Date Additional Medicare IM Given:    Additional Medicare Important Message give by:     If discussed at [place] of Stay Meetings, dates discussed:    Additional CommentsPurcell Mouton, RN 02/12/2015, 11:14 AM

## 2015-02-12 NOTE — Discharge Summary (Signed)
Physician Discharge Summary  Viana Sleep SWF:[id number] DOB: Jun 24, 1941 DOA: 02/09/2015  PCP: Chari Manning, NP  Admit date: 02/09/2015 Discharge date: 02/12/2015  Recommendations for Outpatient Follow-up:  1. Continue ciprofloxacin to complete a 7-day course of antibiotics 2. Primary care doctor follow-up in approximately 2 weeks. Please repeat CBC and consider further workup of anemia if not already complete. 3. HH PT/OT/RN/aid arranged by case management  Discharge Diagnoses:  Principal Problem:   Sepsis secondary to UTI Active Problems:   Sepsis   Essential hypertension   Acute pyelonephritis   Constipation   Lactic acidosis   Acute encephalopathy   Malnutrition of moderate degree   Severe sepsis   Pressure ulcer   Discharge Condition: stable, improved  Diet recommendation: regular  Wt Readings from Last 3 Encounters:  02/12/15 47.945 kg (105 lb 11.2 oz)  09/11/14 47.174 kg (104 lb)  08/27/14 46.902 kg (103 lb 6.4 oz)    History of present illness:  The patient is a 74 yo female with history of CAD, HTN who presented with generalized weakness for 10 days prior to admission with anorexia and constipation.  She was found to have sepsis and bilateral pyelonephrosis secondary to urinary tract infection with E. Coli.    Hospital Course:   Severe sepsis with bilateral pyelonephritis secondary to Escherichia coli urinary tract infection. Her leukocytosis, fevers, and mental status changes improved with broad-spectrum and a biotics which have been narrowed to ciprofloxacin based on culture sensitivities. Recommend treatment for full 7 days as a minimum, therefore, she will be discharged with a course of ciprofloxacin to complete at home, last day on 6/7.  Constipation, resolved with cathartic  Acute encephalopathy was likely due to metabolic encephalopathy from severe sepsis and resolved within a biotics.  Accelerated hypertension, blood pressure continued to be mildly  elevated but trended down with treatment of sepsis.  Lactic acidosis secondary to sepsis and resolved with IV fluids.  Moderate protein calorie malnutrition, liberalized diet and continued supplements with ensure. Nutrition was consulted and met with the family to discuss supplements.  Hypokalemia due to poor oral intake. She received oral potassium chloride.  Weakness secondary to sepsis. She was seen by physical therapy who recommended home health physical therapy and 24-hour assistance via home health aide and family support.  Microcytic anemia, suggestive of iron deficiency. Her hemoglobin has been in the 8-9 range previously intermittently. Her TSH is within normal limits. Defer iron studies, B12, folate and occult stool versus colonoscopy to her primary care doctor.  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  Renally dose medications, including antibiotics  Procedures:  None  Consultations:  None  Antibiotics:  Vancomycin 5/30>>>5/31  Zosyn 5/30>>>  Rocephin 5/29 x 1 dose  Discharge Exam: Filed Vitals:   02/12/15 0502  BP: 159/75  Pulse: 73  Temp: 99.3 F (37.4 C)  Resp: 20   Filed Vitals:   02/11/15 0525 02/11/15 1423 02/11/15 2213 02/12/15 0502  BP: 140/71 145/82 133/74 159/75  Pulse: 70 92 87 73  Temp: 98.6 F (37 C) 98.7 F (37.1 C) 99.6 F (37.6 C) 99.3 F (37.4 C)  TempSrc: Oral Oral Oral Oral  Resp: _0 Height:      Weight: 46.4 kg (102 lb 4.7 oz)   47.945 kg (105 lb 11.2 oz)  SpO2: 100% 100% 99% 100%    General: Thin female, no acute distress Cardiovascular: Regular rate and rhythm, no murmurs, rubs, or gallops, 2+ pulses Respiratory: Clear to auscultation bilaterally, no  increased work of breathing Abdomen: NABS, soft, nondistended, nontender MSK: No lower extremity edema, decreased bulk, normal tone  Discharge Instructions     Medication List    ASK your doctor about these medications        amLODipine 10 MG tablet  Commonly  known as:  NORVASC  Take 1 tablet (10 mg total) by mouth daily.     aspirin EC 81 MG tablet  Take 1 tablet (81 mg total) by mouth daily.     atorvastatin 10 MG tablet  Commonly known as:  LIPITOR  Take 1 tablet (10 mg total) by mouth daily. For cholesterol     cefUROXime 500 MG tablet  Commonly known as:  CEFTIN  Take 1 tablet (500 mg total) by mouth 2 (two) times daily with a meal.     cephALEXin 500 MG capsule  Commonly known as:  KEFLEX  Take 1 capsule (500 mg total) by mouth 2 (two) times daily.     feeding supplement (ENSURE COMPLETE) Liqd  Take 237 mLs by mouth 2 (two) times daily between meals.          The results of significant diagnostics from this hospitalization (including imaging, microbiology, ancillary and laboratory) are listed below for reference.    Significant Diagnostic Studies: Dg Chest Port 1 View  02/09/2015   CLINICAL DATA:  Shortness of Breath  EXAM: PORTABLE CHEST - 1 VIEW  COMPARISON:  11/23/14  FINDINGS: Cardiac shadow is within normal limits. The lungs are well aerated bilaterally with mild interstitial changes. No focal infiltrate or sizable effusion is seen. No acute bony abnormality is noted.  IMPRESSION: No active disease.   Electronically Signed   By: Inez Catalina M.D.   On: 02/09/2015 20:42   Ct Renal Stone Study  02/09/2015   CLINICAL DATA:  Acute onset of generalized weakness and constipation. Question of hematochezia. Initial encounter.  EXAM: CT ABDOMEN AND PELVIS WITHOUT CONTRAST  TECHNIQUE: Multidetector CT imaging of the abdomen and pelvis was performed following the standard protocol without IV contrast.  COMPARISON:  Renal ultrasound performed 08/27/2014  FINDINGS: Minimal bibasilar atelectasis is noted. Scattered coronary artery calcifications are seen. Trace pericardial fluid remains within normal limits.  Small hypodensities within the liver are nonspecific. A small calcified granuloma is noted near the gallbladder fossa, within the  liver. The liver and spleen are otherwise unremarkable. A 8 mm hyperdense focus along the posterior aspect of the gallbladder may reflect a polyp. The pancreas and adrenal glands are grossly unremarkable.  There is minimal bilateral hydronephrosis, with mild prominence of the ureters, slightly more prominent on the left. No obstructing stone is seen distally. Mild bladder wall thickening is noted. This may reflect cystitis and possibly mild pyelonephritis. Scattered bilateral renal cysts are seen, measuring up to 1.6 cm in size.  No free fluid is identified. The small bowel is unremarkable in appearance. The stomach is within normal limits. No acute vascular abnormalities are seen. Scattered calcification is seen along the abdominal aorta and its branches.  The appendix is not well characterized; there is no evidence of appendicitis. The colon is partially filled with stool. The rectum is distended to 7.1 cm in diameter, concerning for mild fecal impaction.  The bladder is mildly distended. Bladder wall thickening is noted as described above, somewhat irregular in appearance. Air is noted within the uterus, of uncertain significance. No suspicious adnexal masses are seen. No inguinal lymphadenopathy is seen.  No acute osseous abnormalities are identified. Multilevel vacuum  phenomenon is noted along the lumbar spine.  IMPRESSION: 1. Minimal bilateral hydronephrosis, with mild prominence of the ureters. No obstructing stone seen distally. Mild bladder wall thickening noted. This may reflect cystitis and possibly mild bilateral pyelonephritis. Would correlate for associated symptoms. 2. Bladder wall thickening is somewhat irregular in appearance. Cystoscopy would be helpful for further evaluation, as deemed clinically appropriate. 3. Rectum distended to 7.1 cm in diameter with stool, concerning for mild fecal impaction. Colon partially filled with stool. 4. Air noted within the uterus, of uncertain significance. Would  correlate for associated symptoms. 5. Scattered coronary artery calcifications seen. 6. Nonspecific small hypodensities within the liver. Apparent 8 mm polyp along the posterior aspect of the gallbladder. 7. Scattered bilateral renal cysts seen. 8. Scattered calcification along the abdominal aorta and its branches. 9. Mild degenerative change along the lumbar spine.   Electronically Signed   By: Garald Balding M.D.   On: 02/09/2015 22:20    Microbiology: Recent Results (from the past 240 hour(s))  Urine culture     Status: None   Collection Time: 02/09/15  8:51 PM  Result Value Ref Range Status   Specimen Description URINE, RANDOM  Final   Special Requests NONE  Final   Colony Count   Final    >=100,000 COLONIES/ML Performed at [facility]    Culture   Final    ESCHERICHIA COLI Performed at [facility]    Report Status 02/12/2015 FINAL  Final   Organism ID, Bacteria ESCHERICHIA COLI  Final      Susceptibility   Escherichia coli - MIC*    AMPICILLIN >=32 RESISTANT Resistant     CEFAZOLIN <=4 SENSITIVE Sensitive     CEFTRIAXONE <=1 SENSITIVE Sensitive     CIPROFLOXACIN <=0.25 SENSITIVE Sensitive     GENTAMICIN <=1 SENSITIVE Sensitive     LEVOFLOXACIN <=0.12 SENSITIVE Sensitive     NITROFURANTOIN <=16 SENSITIVE Sensitive     TOBRAMYCIN <=1 SENSITIVE Sensitive     TRIMETH/SULFA <=20 SENSITIVE Sensitive     PIP/TAZO <=4 SENSITIVE Sensitive     * ESCHERICHIA COLI  Culture, blood (x 2)     Status: None (Preliminary result)   Collection Time: 02/10/15 12:49 AM  Result Value Ref Range Status   Specimen Description BLOOD RIGHT ANTECUBITAL  Final   Special Requests BOTTLES DRAWN AEROBIC AND ANAEROBIC 5CC  Final   Culture   Final           BLOOD CULTURE RECEIVED NO GROWTH TO DATE CULTURE WILL BE HELD FOR 5 DAYS BEFORE ISSUING A FINAL NEGATIVE REPORT Performed at [facility]    Report Status PENDING  Incomplete  Culture, blood (x 2)     Status: None  (Preliminary result)   Collection Time: 02/10/15 12:50 AM  Result Value Ref Range Status   Specimen Description BLOOD RIGHT WRIST  Final   Special Requests BOTTLES DRAWN AEROBIC AND ANAEROBIC 5CC  Final   Culture   Final           BLOOD CULTURE RECEIVED NO GROWTH TO DATE CULTURE WILL BE HELD FOR 5 DAYS BEFORE ISSUING A FINAL NEGATIVE REPORT Performed at [facility]    Report Status PENDING  Incomplete  MRSA PCR Screening     Status: None   Collection Time: 02/10/15  1:36 AM  Result Value Ref Range Status   MRSA by PCR NEGATIVE NEGATIVE Final    Comment:        The GeneXpert MRSA  Assay (FDA approved for NASAL specimens only), is one component of a comprehensive MRSA colonization surveillance program. It is not intended to diagnose MRSA infection nor to guide or monitor treatment for MRSA infections.      Labs: Basic Metabolic Panel:  Recent Labs Lab 02/09/15 2027 02/09/15 2030 02/10/15 0450 02/11/15 0442 02/12/15 0445  NA 138 140 139 140 139  K 3.6 3.4* 3.3* 4.4 4.1  CL 101 100* 108 108 104  CO2 26  --  _0 GLUCOSE 136* 137* 126* 95 103*  BUN _1 23*  CREATININE 1.27* 1.40* 1.17* 1.27* 1.40*  CALCIUM 9.4  --  8.3* 8.6* 8.9   Liver Function Tests:  Recent Labs Lab 02/09/15 2027 02/10/15 0450  AST 33 23  ALT 16 13*  ALKPHOS 70 60  BILITOT 0.8 0.9  PROT 8.9* 7.9  ALBUMIN 4.1 3.5   No results for input(s): LIPASE, AMYLASE in the last 168 hours. No results for input(s): AMMONIA in the last 168 hours. CBC:  Recent Labs Lab 02/09/15 2017 02/09/15 2030 02/10/15 0450 02/11/15 0442 02/12/15 0445  WBC 16.1*  --  20.5* 11.3* 7.5  NEUTROABS  --   --  16.6*  --   --   HGB 10.1* 11.9* 8.8* 8.4* 8.5*  HCT 31.4* 35.0* 27.8* 26.5* 26.5*  MCV 76.0*  --  75.7* 74.9* 74.9*  PLT 601*  --  500* 435* 479*   Cardiac Enzymes: No results for input(s): CKTOTAL, CKMB, CKMBINDEX, TROPONINI in the last 168 hours. BNP: BNP (last 3  results)  Recent Labs  02/09/15 2017  BNP 79.5    ProBNP (last 3 results) No results for input(s): PROBNP in the last 8760 hours.  CBG:  Recent Labs Lab 02/09/15 2004  GLUCAP 124*    Time coordinating discharge: 35 minutes  Signed:  Toccara Alford  [facility] 02/12/2015, 8:36 AM

## 2015-02-12 NOTE — Progress Notes (Signed)
Ashlee Mack, went over all discharge information with pt.  Prescriptions given, all questions answered.  Pt wheeled out by NT. VSS

## 2015-02-12 NOTE — Evaluation (Addendum)
Occupational Therapy Evaluation Patient Details Name: Ashlee Mack MRN: [id number] DOB: Mar 26, 1941 Today's Date: 02/12/2015    History of Present Illness 74 yo female admitted with sepsis secondary to UTI, weakness.    Clinical Impression   Pt up to practice toilet transfer and started to have loose stool on the way to the bathroom with walker. Nursing made aware. Assisted with cleanup standing at the sink and pt needs min assist for balance. Husband is not able to provide a lot of assist as he has some difficulty with his knees and getting around. Granddaughter has been assisting with her ADL. Recommend [place] and aide and 3in1. HR 117 max with activity and down to 90s with rest.    Follow Up Recommendations  Other (comment);Home health OT [facility] aide)    Equipment Recommendations  3 in 1 bedside comode    Recommendations for Other Services       Precautions / Restrictions Precautions Precautions: Fall Restrictions Weight Bearing Restrictions: No      Mobility Bed Mobility            Transfers Overall transfer level: Needs assistance Equipment used: Rolling walker (2 wheeled) Transfers: Sit to/from Stand Sit to Stand: Min guard;Min assist Stand pivot transfers: Min assist       General transfer comment: min guard from commode with bar; min assist from chair as she tried to stand from too far back in chair. min assist to descend to commode due to urgency to have BM. verbal cues hand placement.    Balance                                            ADL Overall ADL's : Needs assistance/impaired Eating/Feeding: Independent;Sitting   Grooming: Wash/dry hands;Min guard;Minimal assistance;Standing   Upper Body Bathing: Set up;Sitting   Lower Body Bathing: Minimal assistance;Sit to/from stand   Upper Body Dressing : Set up;Sitting   Lower Body Dressing: Minimal assistance;Sit to/from stand   Toilet Transfer: Minimal assistance;Ambulation;Comfort  height toilet;Grab bars;RW   Toileting- Clothing Manipulation and Hygiene: Minimal assistance;Sit to/from stand         General ADL Comments: pt states she only sponge bathes will assist by granddaughter as she is afraid to get into the tub. pt with some difficulty rising from lower commode surface so discussed 3in1 and recommend 3in1 for her. Husband and pt appear agreeable to DME. Explained benefit of increased height of surface and bilateral UE supports as pt is tending to pull up heavily with grab bar. on the way into the bathroom, pt started to dribbly loose stool and wasnt aware until OT pointed this out. Nursing made aware. Pt stood for several minutes to clean herself. HR 117 at highest with activity observed. Recommended pt sit for most of bathing and stand for [place] as she is currently a litttle unsteady for increased safety.      Vision     Perception     Praxis      Pertinent Vitals/Pain Pain Assessment: No/denies pain     Hand Dominance     Extremity/Trunk Assessment Upper Extremity Assessment Upper Extremity Assessment: Generalized weakness           Communication Communication Communication: No difficulties   Cognition Arousal/Alertness: Awake/alert Behavior During Therapy: WFL for tasks assessed/performed Overall Cognitive Status: Within Functional Limits for tasks assessed  General Comments       Exercises       Shoulder Instructions      Home Living Family/patient expects to be discharged to:: Private residence Living Arrangements: Spouse/significant other   Type of Home: Apartment Home Access: Stairs to enter [employer] of Steps: 12 Entrance Stairs-Rails: Right Home Layout: One level     Bathroom Shower/Tub: Teacher, early years/pre: Standard     Home Equipment: Environmental consultant - 2 wheels          Prior Functioning/Environment Level of Independence: Needs assistance    ADL's /  Homemaking Assistance Needed: pt states her granddaughter comes over several times a week to help her with bathing. Pt's husband states he has bad knees and has some difficulty getting around and cant help with her showering.        OT Diagnosis: Generalized weakness   OT Problem List: Decreased strength;Decreased knowledge of use of DME or AE   OT Treatment/Interventions: Self-care/ADL training;Patient/family education;Therapeutic activities;DME and/or AE instruction    OT Goals(Current goals can be found in the care plan section) Acute Rehab OT Goals Patient Stated Goal: none stated OT Goal Formulation: With patient/family Time For Goal Achievement: 02/26/15 Potential to Achieve Goals: Good  OT Frequency: Min 2X/week   Barriers to D/C:            Co-evaluation              End of Session Equipment Utilized During Treatment: Rolling walker  Activity Tolerance: Patient tolerated treatment well Patient left: in chair;with call bell/phone within reach;with family/visitor present   Time: 3009-2330 OT Time Calculation (min): 26 min Charges:  OT General Charges $OT Visit: 1 Procedure OT Evaluation $Initial OT Evaluation Tier I: 1 Procedure OT Treatments $Therapeutic Activity: 8-22 mins G-Codes:    Jules Schick  [phone] 02/12/2015, 11:32 AM

## 2015-02-12 NOTE — Progress Notes (Addendum)
PT HR sustaining in the 140's-150's while walking/doing stairs with pt.  Pt back in bed, HR in the 90's.  MD made aware and wants to continue with discharge.  WIll continue to monitor closely.

## 2015-02-12 NOTE — Progress Notes (Signed)
Physical Therapy Treatment Patient Details Name: Ashlee Mack MRN: [id number] DOB: 09/27/1940 Today's Date: 02/12/2015    History of Present Illness 74 yo female admitted with sepsis secondary to UTI, weakness.     PT Comments    Session to practice ambulation and stair negotiation prior to d/c on today (pt has a flight of steps to enter apt). RN notified PT that HR was up to 150s during session. Pt was not symptomatic or expressing any feelings of discomfort. RN recommended pt ride in [facility] back to room. Once back in room and settled, HR 103 bpm at end of session.   Follow Up Recommendations  Home health PT;Supervision/Assistance - 24 hour (home health aide)     Equipment Recommendations  None recommended by PT    Recommendations for Other Services OT consult     Precautions / Restrictions Precautions Precautions: Fall Restrictions Weight Bearing Restrictions: No    Mobility  Bed Mobility Overal bed mobility: Needs Assistance Bed Mobility: Supine to Sit     Supine to sit: Supervision     General bed mobility comments: Increased time.   Transfers Overall transfer level: Needs assistance Equipment used: Rolling walker (2 wheeled) Transfers: Sit to/from [facility] Sit to Stand: Min guard Stand pivot transfers: Min assist       General transfer comment: close guard for safety. VCs safety, hand placement. stand pivot from bed to bsc-assist to steady-did not use RW.   Ambulation/Gait Ambulation/Gait assistance: Min guard Ambulation Distance (Feet): 60 Feet Assistive device: Rolling walker (2 wheeled) Gait Pattern/deviations: Step-through pattern;Decreased stride length     General Gait Details: slow gait speed. close guard for safety. VCs safety, distance from RW, posture.    Stairs Stairs: Yes Stairs assistance: Min guard Stair Management: One rail Right;Step to pattern;Forwards Number of Stairs: 9 General stair comments: close guard for  safety. VCs for pt to take 1 step at a time.   Wheelchair Mobility    Modified Rankin (Stroke Patients Only)       Balance                                    Cognition Arousal/Alertness: Awake/alert Behavior During Therapy: WFL for tasks assessed/performed Overall Cognitive Status: Within Functional Limits for tasks assessed                      Exercises      General Comments        Pertinent Vitals/Pain Pain Assessment: No/denies pain    Home Living                      Prior Function            PT Goals (current goals can now be found in the care plan section) Progress towards PT goals: Progressing toward goals    Frequency  Min 3X/week    PT Plan Current plan remains appropriate    Co-evaluation             End of Session Equipment Utilized During Treatment: Gait belt Activity Tolerance: Patient tolerated treatment well (HR up to 150s with stairs+ambulation) Patient left: in chair;with call bell/phone within reach;with family/visitor present     Time: 7619-5093 PT Time Calculation (min) (ACUTE ONLY): 23 min  Charges:  $Gait Training: 23-37 mins  G Codes:      Weston Anna, MPT Pager: [phone]

## 2015-02-16 LAB — CULTURE, BLOOD (ROUTINE X 2)
CULTURE: NO GROWTH
CULTURE: NO GROWTH

## 2015-02-24 ENCOUNTER — Inpatient Hospital Stay: Payer: Medicare Other | Admitting: Internal Medicine

## 2015-04-29 ENCOUNTER — Other Ambulatory Visit: Payer: Self-pay | Admitting: Internal Medicine

## 2015-04-30 ENCOUNTER — Other Ambulatory Visit: Payer: Self-pay | Admitting: Internal Medicine

## 2015-04-30 DIAGNOSIS — E785 Hyperlipidemia, unspecified: Secondary | ICD-10-CM

## 2015-04-30 MED ORDER — ATORVASTATIN CALCIUM 10 MG PO TABS: 10.0000 mg | ORAL_TABLET | Freq: Every day | ORAL | Status: DC

## 2015-04-30 MED ORDER — AMLODIPINE BESYLATE 10 MG PO TABS: 10.0000 mg | ORAL_TABLET | Freq: Every day | ORAL | Status: DC

## 2015-05-20 ENCOUNTER — Encounter: Payer: Self-pay | Admitting: Internal Medicine

## 2015-05-20 ENCOUNTER — Ambulatory Visit: Payer: Medicare Other | Attending: Internal Medicine | Admitting: Internal Medicine

## 2015-05-20 VITALS — BP 171/82 | HR 102 | Temp 98.8°F | Resp 15 | Ht 62.0 in | Wt 107.8 lb

## 2015-05-20 DIAGNOSIS — I2510 Atherosclerotic heart disease of native coronary artery without angina pectoris: Secondary | ICD-10-CM

## 2015-05-20 DIAGNOSIS — D509 Iron deficiency anemia, unspecified: Secondary | ICD-10-CM

## 2015-05-20 DIAGNOSIS — N184 Chronic kidney disease, stage 4 (severe): Secondary | ICD-10-CM | POA: Diagnosis not present

## 2015-05-20 DIAGNOSIS — I10 Essential (primary) hypertension: Secondary | ICD-10-CM | POA: Diagnosis not present

## 2015-05-20 DIAGNOSIS — E785 Hyperlipidemia, unspecified: Secondary | ICD-10-CM | POA: Diagnosis not present

## 2015-05-20 MED ORDER — ATORVASTATIN CALCIUM 10 MG PO TABS: 10.0000 mg | ORAL_TABLET | Freq: Every day | ORAL | Status: DC

## 2015-05-20 MED ORDER — AMLODIPINE BESYLATE 10 MG PO TABS: 10.0000 mg | ORAL_TABLET | Freq: Every day | ORAL | Status: DC

## 2015-05-20 MED ORDER — POLYETHYLENE GLYCOL 3350 17 GM/SCOOP PO POWD: 17.0000 g | Freq: Every day | ORAL | Status: DC | PRN

## 2015-05-20 MED ORDER — FERROUS SULFATE 325 (65 FE) MG PO TABS: 325.0000 mg | ORAL_TABLET | Freq: Every day | ORAL | Status: DC

## 2015-05-20 NOTE — Patient Instructions (Addendum)
Sent referral to [facility]  Ph. # [phone] .Please call to make her a appointment   I have also called the [employer] representatives and they are handling her claim for adult briefs but I am trying to figure out why you are being charged so much. Once I hear back from them I will call you back.

## 2015-05-20 NOTE — Progress Notes (Signed)
Patient ID: Ashlee Mack, female   DOB: 02/10/1941, 74 y.o.   MRN: [id number] Subjective:  Ashlee Mack is a 74 y.o. female with hypertension, chronic kidney disease, hyperlipidemia, anemia. She presents today for follow-up of hypertension. Current Outpatient Prescriptions  Medication Sig Dispense Refill  . amLODipine (NORVASC) 10 MG tablet Take 1 tablet (10 mg total) by mouth daily. 30 tablet 0  . aspirin EC 81 MG tablet Take 1 tablet (81 mg total) by mouth daily. 30 tablet 5  . atorvastatin (LIPITOR) 10 MG tablet Take 1 tablet (10 mg total) by mouth daily. For cholesterol 30 tablet 0  . ciprofloxacin (CIPRO) 250 MG tablet Take 1 tablet (250 mg total) by mouth 2 (two) times daily. 12 tablet 0  . feeding supplement, ENSURE COMPLETE, (ENSURE COMPLETE) LIQD Take 237 mLs by mouth 2 (two) times daily between meals. (Patient not taking: Reported on 02/09/2015)     No current facility-administered medications for this visit.    Hypertension ROS: taking medications as instructed, no medication side effects noted, no TIA's, no chest pain on exertion, no dyspnea on exertion and no swelling of ankles.  Other than what is stated, all other systems are negative.    Objective:  BP 171/82 mmHg  Pulse 102  Temp(Src) 98.8 F (37.1 C)  Resp 15  Ht 5\' 2"  (1.575 m)  Wt 107 lb 12.8 oz (48.898 kg)  BMI 19.71 kg/m2  SpO2 98%  Appearance alert, well appearing, and in no distress and oriented to person, place, and time. General exam BP noted to be moderately elevated today in office, S1, S2 normal, no gallop, no murmur, chest clear, no JVD, no HSM, no edema.  Lab review: orders written for new lab studies as appropriate; see orders.   Assessment:   Diagnoses and all orders for this visit:  Essential hypertension -     amLODipine (NORVASC) 10 MG tablet; Take 1 tablet (10 mg total) by mouth daily. -     Basic metabolic panel; Future Hypertension poorly controlled, needs improvement and needs to follow diet  more regularly. Patient and family are poor historians unsure of actual compliance.   I have stressed to patient and son that strict blood pressure control is needed to prevent any further kidney damage.  Chronic kidney disease (CKD), stage IV (severe) -     Ambulatory referral to Nephrology Patient was referred to nephrology back in August last year but son reports patient never went. I have explained the severity of patient's kidney disease and need for nephrology consult.  I have given patient the phone number to [facility] and advised him to call to make an appointment.  Anemia, iron deficiency -     Begin ferrous sulfate 325 (65 FE) MG tablet; Take 1 tablet (325 mg total) by mouth daily with breakfast. -     Begin polyethylene glycol powder (GLYCOLAX/MIRALAX) powder; Take 17 g by mouth daily as needed. For constipation associated with iron use -     CBC; Future Patient denies blood in stool. I will have patient to begin iron supplements once per day and I have provided a stool softener to help for constipation which may result from iron use.  HLD (hyperlipidemia) -    Refill atorvastatin (LIPITOR) 10 MG tablet; Take 1 tablet (10 mg total) by mouth daily. For cholesterol Patient last LDL was taken December 2015 which was 137 and she is currently not at goal. I will recheck patient lipid panel on next exam. I  have advised patient to come fasting on next office visit.  Return in about 4 weeks (around 06/17/2015) for Lab Visit and RN-BP check.   Lance Bosch, NP 05/20/2015 7:03 PM

## 2015-06-16 DIAGNOSIS — Z23 Encounter for immunization: Secondary | ICD-10-CM | POA: Diagnosis not present

## 2015-06-16 DIAGNOSIS — N183 Chronic kidney disease, stage 3 (moderate): Secondary | ICD-10-CM | POA: Diagnosis not present

## 2015-06-16 DIAGNOSIS — I10 Essential (primary) hypertension: Secondary | ICD-10-CM | POA: Diagnosis not present

## 2015-06-24 DIAGNOSIS — I10 Essential (primary) hypertension: Secondary | ICD-10-CM | POA: Diagnosis not present

## 2015-06-24 DIAGNOSIS — N183 Chronic kidney disease, stage 3 (moderate): Secondary | ICD-10-CM | POA: Diagnosis not present

## 2015-07-30 ENCOUNTER — Inpatient Hospital Stay (HOSPITAL_COMMUNITY)
Admission: EM | Admit: 2015-07-30 | Discharge: 2015-08-02 | DRG: 689 | Disposition: A | Discharge status: Home Health | Payer: Medicare Other | Attending: Internal Medicine | Admitting: Internal Medicine

## 2015-07-30 ENCOUNTER — Emergency Department (HOSPITAL_COMMUNITY): Payer: Medicare Other

## 2015-07-30 ENCOUNTER — Encounter (HOSPITAL_COMMUNITY): Payer: Self-pay | Admitting: Emergency Medicine

## 2015-07-30 DIAGNOSIS — N183 Chronic kidney disease, stage 3 unspecified: Secondary | ICD-10-CM | POA: Diagnosis present

## 2015-07-30 DIAGNOSIS — Z8249 Family history of ischemic heart disease and other diseases of the circulatory system: Secondary | ICD-10-CM

## 2015-07-30 DIAGNOSIS — Z8673 Personal history of transient ischemic attack (TIA), and cerebral infarction without residual deficits: Secondary | ICD-10-CM | POA: Diagnosis not present

## 2015-07-30 DIAGNOSIS — Z79899 Other long term (current) drug therapy: Secondary | ICD-10-CM | POA: Diagnosis not present

## 2015-07-30 DIAGNOSIS — K8020 Calculus of gallbladder without cholecystitis without obstruction: Secondary | ICD-10-CM | POA: Diagnosis present

## 2015-07-30 DIAGNOSIS — N390 Urinary tract infection, site not specified: Secondary | ICD-10-CM | POA: Diagnosis not present

## 2015-07-30 DIAGNOSIS — K5641 Fecal impaction: Secondary | ICD-10-CM | POA: Diagnosis present

## 2015-07-30 DIAGNOSIS — R404 Transient alteration of awareness: Secondary | ICD-10-CM | POA: Diagnosis not present

## 2015-07-30 DIAGNOSIS — E860 Dehydration: Secondary | ICD-10-CM | POA: Diagnosis present

## 2015-07-30 DIAGNOSIS — Z681 Body mass index (BMI) 19 or less, adult: Secondary | ICD-10-CM

## 2015-07-30 DIAGNOSIS — R739 Hyperglycemia, unspecified: Secondary | ICD-10-CM | POA: Diagnosis present

## 2015-07-30 DIAGNOSIS — K5900 Constipation, unspecified: Secondary | ICD-10-CM | POA: Diagnosis present

## 2015-07-30 DIAGNOSIS — I872 Venous insufficiency (chronic) (peripheral): Secondary | ICD-10-CM | POA: Diagnosis not present

## 2015-07-30 DIAGNOSIS — E43 Unspecified severe protein-calorie malnutrition: Secondary | ICD-10-CM | POA: Diagnosis present

## 2015-07-30 DIAGNOSIS — I10 Essential (primary) hypertension: Secondary | ICD-10-CM | POA: Diagnosis present

## 2015-07-30 DIAGNOSIS — Z7982 Long term (current) use of aspirin: Secondary | ICD-10-CM

## 2015-07-30 DIAGNOSIS — R531 Weakness: Secondary | ICD-10-CM | POA: Diagnosis not present

## 2015-07-30 DIAGNOSIS — Z955 Presence of coronary angioplasty implant and graft: Secondary | ICD-10-CM | POA: Diagnosis not present

## 2015-07-30 DIAGNOSIS — D649 Anemia, unspecified: Secondary | ICD-10-CM | POA: Diagnosis present

## 2015-07-30 DIAGNOSIS — R55 Syncope and collapse: Secondary | ICD-10-CM | POA: Diagnosis not present

## 2015-07-30 DIAGNOSIS — E876 Hypokalemia: Secondary | ICD-10-CM | POA: Diagnosis not present

## 2015-07-30 DIAGNOSIS — R197 Diarrhea, unspecified: Secondary | ICD-10-CM | POA: Diagnosis not present

## 2015-07-30 DIAGNOSIS — I4581 Long QT syndrome: Secondary | ICD-10-CM | POA: Diagnosis present

## 2015-07-30 DIAGNOSIS — N189 Chronic kidney disease, unspecified: Secondary | ICD-10-CM | POA: Diagnosis not present

## 2015-07-30 DIAGNOSIS — I129 Hypertensive chronic kidney disease with stage 1 through stage 4 chronic kidney disease, or unspecified chronic kidney disease: Secondary | ICD-10-CM | POA: Diagnosis not present

## 2015-07-30 DIAGNOSIS — R001 Bradycardia, unspecified: Secondary | ICD-10-CM | POA: Diagnosis not present

## 2015-07-30 DIAGNOSIS — E785 Hyperlipidemia, unspecified: Secondary | ICD-10-CM | POA: Diagnosis present

## 2015-07-30 DIAGNOSIS — R1030 Lower abdominal pain, unspecified: Secondary | ICD-10-CM | POA: Diagnosis not present

## 2015-07-30 DIAGNOSIS — I491 Atrial premature depolarization: Secondary | ICD-10-CM | POA: Diagnosis not present

## 2015-07-30 DIAGNOSIS — Z66 Do not resuscitate: Secondary | ICD-10-CM | POA: Diagnosis present

## 2015-07-30 DIAGNOSIS — D509 Iron deficiency anemia, unspecified: Secondary | ICD-10-CM | POA: Diagnosis not present

## 2015-07-30 DIAGNOSIS — I2510 Atherosclerotic heart disease of native coronary artery without angina pectoris: Secondary | ICD-10-CM | POA: Diagnosis present

## 2015-07-30 DIAGNOSIS — I351 Nonrheumatic aortic (valve) insufficiency: Secondary | ICD-10-CM | POA: Diagnosis not present

## 2015-07-30 DIAGNOSIS — I441 Atrioventricular block, second degree: Secondary | ICD-10-CM | POA: Diagnosis not present

## 2015-07-30 DIAGNOSIS — R1013 Epigastric pain: Secondary | ICD-10-CM

## 2015-07-30 DIAGNOSIS — R109 Unspecified abdominal pain: Secondary | ICD-10-CM

## 2015-07-30 DIAGNOSIS — I8311 Varicose veins of right lower extremity with inflammation: Secondary | ICD-10-CM | POA: Diagnosis not present

## 2015-07-30 DIAGNOSIS — N179 Acute kidney failure, unspecified: Secondary | ICD-10-CM | POA: Diagnosis not present

## 2015-07-30 HISTORY — DX: Chronic kidney disease, stage 3 (moderate): N18.3

## 2015-07-30 LAB — URINE MICROSCOPIC-ADD ON

## 2015-07-30 LAB — HEPATIC FUNCTION PANEL
ALBUMIN: 4.2 g/dL (ref 3.5–5.0)
ALT: 37 U/L (ref 14–54)
AST: 48 U/L — ABNORMAL HIGH (ref 15–41)
Alkaline Phosphatase: 61 U/L (ref 38–126)
Bilirubin, Direct: <0.1 mg/dL — ABNORMAL LOW (ref 0.1–0.5)
Total Bilirubin: 0.7 mg/dL (ref 0.3–1.2)
Total Protein: 9 g/dL — ABNORMAL HIGH (ref 6.5–8.1)

## 2015-07-30 LAB — URINALYSIS, ROUTINE W REFLEX MICROSCOPIC
Bilirubin Urine: NEGATIVE
Glucose, UA: NEGATIVE mg/dL
Ketones, ur: NEGATIVE mg/dL
Nitrite: NEGATIVE
Protein, ur: NEGATIVE mg/dL
SPECIFIC GRAVITY, URINE: 1.015 (ref 1.005–1.030)
pH: 6 (ref 5.0–8.0)

## 2015-07-30 LAB — BASIC METABOLIC PANEL
ANION GAP: 12 (ref 5–15)
BUN: 34 mg/dL — ABNORMAL HIGH (ref 6–20)
CALCIUM: 9.6 mg/dL (ref 8.9–10.3)
CO2: 28 mmol/L (ref 22–32)
Chloride: 99 mmol/L — ABNORMAL LOW (ref 101–111)
Creatinine, Ser: 1.42 mg/dL — ABNORMAL HIGH (ref 0.44–1.00)
GFR calc non Af Amer: 36 mL/min — ABNORMAL LOW (ref >60)
GFR, EST AFRICAN AMERICAN: 41 mL/min — AB (ref >60)
Glucose, Bld: 119 mg/dL — ABNORMAL HIGH (ref 65–99)
Potassium: 3 mmol/L — ABNORMAL LOW (ref 3.5–5.1)
SODIUM: 139 mmol/L (ref 135–145)

## 2015-07-30 LAB — CBC
HCT: 29.2 % — ABNORMAL LOW (ref 36.0–46.0)
HEMOGLOBIN: 9.2 g/dL — AB (ref 12.0–15.0)
MCH: 23.3 pg — AB (ref 26.0–34.0)
MCHC: 31.5 g/dL (ref 30.0–36.0)
MCV: 73.9 fL — ABNORMAL LOW (ref 78.0–100.0)
PLATELETS: 777 10*3/uL — AB (ref 150–400)
RBC: 3.95 MIL/uL (ref 3.87–5.11)
RDW: 27.7 % — ABNORMAL HIGH (ref 11.5–15.5)
WBC: 9.9 10*3/uL (ref 4.0–10.5)

## 2015-07-30 LAB — LIPASE, BLOOD: Lipase: 56 U/L — ABNORMAL HIGH (ref 11–51)

## 2015-07-30 LAB — CBG MONITORING, ED: GLUCOSE-CAPILLARY: 113 mg/dL — AB (ref 65–99)

## 2015-07-30 MED ORDER — SODIUM CHLORIDE 0.9 % IV SOLN
INTRAVENOUS | Status: DC
  Administered 2015-07-30: 23:00:00 via INTRAVENOUS

## 2015-07-30 MED ORDER — SODIUM CHLORIDE 0.9 % IV BOLUS (SEPSIS)
500.0000 mL | Freq: Once | INTRAVENOUS | Status: AC
  Administered 2015-07-30: 500 mL via INTRAVENOUS

## 2015-07-30 MED ORDER — IOHEXOL 300 MG/ML  SOLN
25.0000 mL | Freq: Once | INTRAMUSCULAR | Status: AC | PRN
  Administered 2015-07-30: 25 mL via ORAL

## 2015-07-30 MED ORDER — IOHEXOL 300 MG/ML  SOLN
100.0000 mL | Freq: Once | INTRAMUSCULAR | Status: AC | PRN
  Administered 2015-07-30: 75 mL via INTRAVENOUS

## 2015-07-30 MED ORDER — ONDANSETRON HCL 4 MG/2ML IJ SOLN
4.0000 mg | Freq: Once | INTRAMUSCULAR | Status: AC
Start: 2015-07-30 — End: 2015-07-30
  Administered 2015-07-30: 4 mg via INTRAVENOUS
  Filled 2015-07-30: qty 2

## 2015-07-30 NOTE — ED Notes (Signed)
Pt states feeling increasingly weak over the last week. Vitals stable. Pt states has recently been having dark loose stools. Pt presents very pale in triage. Denies any pain, n/v/d, fever/chills, SOB

## 2015-07-30 NOTE — ED Provider Notes (Signed)
CSN: [id number]     Arrival date & time 07/30/15  1459 History   First MD Initiated Contact with Patient 07/30/15 1548     Chief Complaint  Patient presents with  . Weakness  . Diarrhea     (Consider location/radiation/quality/duration/timing/severity/associated sxs/prior Treatment) Patient is a 74 y.o. female presenting with weakness and diarrhea. The history is provided by the patient.  Weakness Pertinent negatives include no chest pain.  Diarrhea Associated symptoms: no chills    patient has been feeling bad over the last week. Reportedly has had some diarrhea. Generalized weakness. Decreased appetite. Denies abdominal pain but on palpation is moderate amount of tenderness inferiorly. Husband states he thinks that she needs some fluids.  Past Medical History  Diagnosis Date  . Coronary artery disease   . Hypertension    Past Surgical History  Procedure Laterality Date  . Coronary angioplasty with stent placement     Family History  Problem Relation Age of Onset  . Hypertension Mother   . Hypertension Father    Social History  Substance Use Topics  . Smoking status: Never Smoker   . Smokeless tobacco: None  . Alcohol Use: No   OB History    No data available     Review of Systems  Constitutional: Positive for appetite change and fatigue. Negative for chills.  Cardiovascular: Negative for chest pain.  Gastrointestinal: Positive for nausea and diarrhea.  Musculoskeletal: Negative for back pain.  Skin: Negative for rash and wound.  Neurological: Positive for weakness.  Psychiatric/Behavioral: Negative for hallucinations.      Allergies  Review of patient's allergies indicates no known allergies.  Home Medications   Prior to Admission medications   Medication Sig Start Date End Date Taking? Authorizing Provider  amLODipine (NORVASC) 10 MG tablet Take 1 tablet (10 mg total) by mouth daily. 05/20/15  Yes Lance Bosch, NP  aspirin EC 81 MG tablet Take 1  tablet (81 mg total) by mouth daily. 12/09/14  Yes Lance Bosch, NP  atorvastatin (LIPITOR) 10 MG tablet Take 1 tablet (10 mg total) by mouth daily. For cholesterol 05/20/15  Yes Lance Bosch, NP  carvedilol (COREG) 12.5 MG tablet Take 12.5 mg by mouth 2 (two) times daily with a meal.   Yes Historical Provider, MD  chlorthalidone (HYGROTON) 25 MG tablet Take 25 mg by mouth daily.   Yes Historical Provider, MD  feeding supplement, ENSURE COMPLETE, (ENSURE COMPLETE) LIQD Take 237 mLs by mouth 2 (two) times daily between meals. 08/29/14  Yes Barton Dubois, MD  ferrous sulfate 325 (65 FE) MG tablet Take 1 tablet (325 mg total) by mouth daily with breakfast. 05/20/15  Yes Lance Bosch, NP  polyethylene glycol powder (GLYCOLAX/MIRALAX) powder Take 17 g by mouth daily as needed. For constipation associated with iron use 05/20/15  Yes Lance Bosch, NP  ciprofloxacin (CIPRO) 250 MG tablet Take 1 tablet (250 mg total) by mouth 2 (two) times daily. 02/12/15   Janece Canterbury, MD   BP 125/67 mmHg  Pulse 70  Temp(Src) 98.1 F (36.7 C) (Oral)  Resp 13  Ht 5\' 8"  (1.727 m)  Wt 110 lb (49.896 kg)  BMI 16.73 kg/m2  SpO2 100% Physical Exam  Constitutional: She is oriented to person, place, and time. She appears well-developed and well-nourished.  HENT:  Head: Normocephalic and atraumatic.  Eyes: EOM are normal. Pupils are equal, round, and reactive to light.  Neck: Normal range of motion. Neck supple.  Cardiovascular: Normal rate,  regular rhythm and normal heart sounds.   No murmur heard. Pulmonary/Chest: Effort normal and breath sounds normal. No respiratory distress. She has no wheezes. She has no rales.  Abdominal: Soft. Bowel sounds are normal. She exhibits mass. She exhibits no distension. There is tenderness.  Suprapubic fullness/mass. Tenderness.  Musculoskeletal: Normal range of motion.  Neurological: She is alert and oriented to person, place, and time. No cranial nerve deficit.  Skin: Skin is  warm and dry.  Psychiatric: She has a normal mood and affect. Her speech is normal.  Nursing note and vitals reviewed.   ED Course  Procedures (including critical care time) Labs Review Labs Reviewed  BASIC METABOLIC PANEL - Abnormal; Notable for the following:    Potassium 3.0 (*)    Chloride 99 (*)    Glucose, Bld 119 (*)    BUN 34 (*)    Creatinine, Ser 1.42 (*)    GFR calc non Af Amer 36 (*)    GFR calc Af Amer 41 (*)    All other components within normal limits  CBC - Abnormal; Notable for the following:    Hemoglobin 9.2 (*)    HCT 29.2 (*)    MCV 73.9 (*)    MCH 23.3 (*)    RDW 27.7 (*)    Platelets 777 (*)    All other components within normal limits  URINALYSIS, ROUTINE W REFLEX MICROSCOPIC (NOT AT [facility]) - Abnormal; Notable for the following:    APPearance CLOUDY (*)    Hgb urine dipstick MODERATE (*)    Leukocytes, UA MODERATE (*)    All other components within normal limits  LIPASE, BLOOD - Abnormal; Notable for the following:    Lipase 56 (*)    All other components within normal limits  HEPATIC FUNCTION PANEL - Abnormal; Notable for the following:    Total Protein 9.0 (*)    AST 48 (*)    Bilirubin, Direct <0.1 (*)    All other components within normal limits  URINE MICROSCOPIC-ADD ON - Abnormal; Notable for the following:    Squamous Epithelial / LPF 0-5 (*)    Bacteria, UA MANY (*)    Crystals CA OXALATE CRYSTALS (*)    All other components within normal limits  CBG MONITORING, ED - Abnormal; Notable for the following:    Glucose-Capillary 113 (*)    All other components within normal limits    Imaging Review Ct Abdomen Pelvis W Contrast  07/30/2015  CLINICAL DATA:  Lower abdominal pain, worsening weakness. EXAM: CT ABDOMEN AND PELVIS WITH CONTRAST TECHNIQUE: Multidetector CT imaging of the abdomen and pelvis was performed using the standard protocol following bolus administration of intravenous contrast. CONTRAST:  1mL OMNIPAQUE IOHEXOL 300 MG/ML  SOLN, 92mL OMNIPAQUE IOHEXOL 300 MG/ML SOLN COMPARISON:  02/09/2015 unenhanced CT abdomen/pelvis. FINDINGS: Lower chest: No significant pulmonary nodules or acute consolidative airspace disease. Left anterior descending coronary atherosclerosis. Hepatobiliary: Simple 0.8 cm right liver lobe cyst. At least 4 additional subcentimeter hypodense liver lesions, too small to characterize. There is a 1.0 cm peripherally calcified gallstone layering in the nondistended gallbladder, with no gallbladder wall thickening or pericholecystic fat stranding. Stable mild diffuse intrahepatic biliary ductal dilatation. Stable mildly dilated common bile duct (7 mm diameter), with smooth distal tapering and no radiopaque choledocholithiasis. Pancreas: Normal, with no mass or duct dilation. Spleen: Normal size. No mass. Adrenals/Urinary Tract: Normal adrenals. There are small simple bilateral renal cysts, largest 1.1 cm in the lower right kidney and 1.6 cm  in the lower left kidney. There are innumerable additional subcentimeter hypodense renal lesions throughout both kidneys, too small to characterize. There is stable mild fullness of the right renal collecting system without overt right hydronephrosis. There are small extrarenal pelves bilaterally, unchanged. No left hydronephrosis. Borderline mild diffuse bladder wall thickening. No focal bladder abnormality. Stomach/Bowel: Grossly normal stomach. Normal caliber small bowel with no small bowel wall thickening. Appendix is not discretely visualized. There is a large amount of stool throughout the colon, with no colonic wall thickening or colonic diverticulosis. The rectum is prominently distended by stool up to a diameter of 6.6 cm. There is no appreciable rectal wall thickening or pneumatosis. Vascular/Lymphatic: Normal caliber abdominal aorta. Patent portal, splenic, hepatic and renal veins. No pathologically enlarged lymph nodes in the abdomen or pelvis. Reproductive: The  endometrial cavity is distended by gas, which is a chronic finding. Other: No pneumoperitoneum, ascites or focal fluid collection. Musculoskeletal: No aggressive appearing focal osseous lesions. Mild-to-moderate degenerative changes in the visualized thoracolumbar spine. Stable moderate dextroscoliosis of the thoracolumbar spine. IMPRESSION: 1. No evidence of bowel obstruction or acute bowel inflammation. 2. Prominent colorectal stool volume, suggesting constipation, with likely fecal impaction in the rectum. No evidence of stercoral colitis. 3. Borderline mild diffuse bladder wall thickening, recommend correlation with urinalysis to exclude acute cystitis. 4. Cholelithiasis. No evidence of acute cholecystitis. Stable mild chronic biliary ductal dilatation, with no radiopaque choledocholithiasis. Recommend correlation with serum bilirubin levels. 5. Chronic gaseous distention of the endometrial cavity, a finding of uncertain significance. There is no uterine hyperenhancement to suggest acute gynecologic infection. There is no evidence of a uterine fistula to the bowel. Electronically Signed   By: Ilona Sorrel M.D.   On: 07/30/2015 19:21   I have personally reviewed and evaluated these images and lab results as part of my medical decision-making.   EKG Interpretation   Date/Time:  Wednesday July 30 2015 15:10:36 EST Ventricular Rate:  86 PR Interval:  174 QRS Duration: 101 QT Interval:  483 QTC Calculation: 578 R Axis:   42 Text Interpretation:  Sinus rhythm LAE, consider biatrial enlargement LVH  with secondary repolarization abnormality Anterior infarct, old Prolonged  QT interval Baseline wander in lead(s) V2 Confirmed by Alvino Chapel  MD,  Ovid Curd [phone]) on 07/30/2015 4:19:10 PM      MDM   Final diagnoses:  Chronic renal insufficiency, unspecified stage  Lower urinary tract infection   patient with generalized weakness. Lower abdominal tenderness found to have UTI on lab work and CT.  Will admit to internal medicine since she is doing poorly at home.  Davonna Belling, MD 07/31/15 Laureen Abrahams

## 2015-07-31 ENCOUNTER — Inpatient Hospital Stay (HOSPITAL_COMMUNITY): Payer: Medicare Other

## 2015-07-31 ENCOUNTER — Encounter (HOSPITAL_COMMUNITY): Payer: Self-pay | Admitting: Internal Medicine

## 2015-07-31 DIAGNOSIS — R531 Weakness: Secondary | ICD-10-CM

## 2015-07-31 DIAGNOSIS — N189 Chronic kidney disease, unspecified: Secondary | ICD-10-CM

## 2015-07-31 DIAGNOSIS — N183 Chronic kidney disease, stage 3 unspecified: Secondary | ICD-10-CM

## 2015-07-31 DIAGNOSIS — D649 Anemia, unspecified: Secondary | ICD-10-CM | POA: Diagnosis present

## 2015-07-31 DIAGNOSIS — I10 Essential (primary) hypertension: Secondary | ICD-10-CM

## 2015-07-31 DIAGNOSIS — N390 Urinary tract infection, site not specified: Principal | ICD-10-CM

## 2015-07-31 HISTORY — DX: Chronic kidney disease, stage 3 unspecified: N18.30

## 2015-07-31 LAB — BASIC METABOLIC PANEL
ANION GAP: 11 (ref 5–15)
BUN: 28 mg/dL — ABNORMAL HIGH (ref 6–20)
CHLORIDE: 97 mmol/L — AB (ref 101–111)
CO2: 28 mmol/L (ref 22–32)
Calcium: 8.6 mg/dL — ABNORMAL LOW (ref 8.9–10.3)
Creatinine, Ser: 1.36 mg/dL — ABNORMAL HIGH (ref 0.44–1.00)
GFR calc non Af Amer: 38 mL/min — ABNORMAL LOW (ref >60)
GFR, EST AFRICAN AMERICAN: 44 mL/min — AB (ref >60)
GLUCOSE: 210 mg/dL — AB (ref 65–99)
Potassium: 3 mmol/L — ABNORMAL LOW (ref 3.5–5.1)
Sodium: 136 mmol/L (ref 135–145)

## 2015-07-31 LAB — CBC
HEMATOCRIT: 27.1 % — AB (ref 36.0–46.0)
HEMOGLOBIN: 8.6 g/dL — AB (ref 12.0–15.0)
MCH: 23.9 pg — AB (ref 26.0–34.0)
MCHC: 31.7 g/dL (ref 30.0–36.0)
MCV: 75.3 fL — AB (ref 78.0–100.0)
Platelets: 585 10*3/uL — ABNORMAL HIGH (ref 150–400)
RBC: 3.6 MIL/uL — ABNORMAL LOW (ref 3.87–5.11)
RDW: 27.5 % — ABNORMAL HIGH (ref 11.5–15.5)
WBC: 9.8 10*3/uL (ref 4.0–10.5)

## 2015-07-31 LAB — MAGNESIUM: MAGNESIUM: 2.4 mg/dL (ref 1.7–2.4)

## 2015-07-31 LAB — CK: Total CK: 37 U/L — ABNORMAL LOW (ref 38–234)

## 2015-07-31 LAB — HEPATIC FUNCTION PANEL
ALBUMIN: 3.6 g/dL (ref 3.5–5.0)
ALT: 32 U/L (ref 14–54)
AST: 42 U/L — AB (ref 15–41)
Alkaline Phosphatase: 54 U/L (ref 38–126)
Bilirubin, Direct: <0.1 mg/dL — ABNORMAL LOW (ref 0.1–0.5)
Total Bilirubin: 0.3 mg/dL (ref 0.3–1.2)
Total Protein: 7.9 g/dL (ref 6.5–8.1)

## 2015-07-31 LAB — TROPONIN I: Troponin I: <0.03 ng/mL (ref <0.031)

## 2015-07-31 LAB — LIPASE, BLOOD: Lipase: 46 U/L (ref 11–51)

## 2015-07-31 MED ORDER — AMLODIPINE BESYLATE 10 MG PO TABS
10.0000 mg | ORAL_TABLET | Freq: Every day | ORAL | Status: DC
  Administered 2015-07-31 – 2015-08-02 (×3): 10 mg via ORAL
  Filled 2015-07-31 (×3): qty 1

## 2015-07-31 MED ORDER — SORBITOL 70 % SOLN
960.0000 mL | TOPICAL_OIL | Freq: Once | ORAL | Status: AC
  Administered 2015-07-31: 960 mL via RECTAL
  Filled 2015-07-31: qty 240

## 2015-07-31 MED ORDER — ENSURE ENLIVE PO LIQD
237.0000 mL | Freq: Two times a day (BID) | ORAL | Status: DC
  Administered 2015-07-31 – 2015-08-02 (×4): 237 mL via ORAL
  Filled 2015-07-31 (×2): qty 237

## 2015-07-31 MED ORDER — FERROUS SULFATE 325 (65 FE) MG PO TABS
325.0000 mg | ORAL_TABLET | Freq: Every day | ORAL | Status: DC
  Administered 2015-08-02: 325 mg via ORAL
  Filled 2015-07-31 (×5): qty 1

## 2015-07-31 MED ORDER — DEXTROSE 5 % IV SOLN
1.0000 g | Freq: Every day | INTRAVENOUS | Status: DC
  Administered 2015-07-31 – 2015-08-01 (×3): 1 g via INTRAVENOUS
  Filled 2015-07-31 (×3): qty 10

## 2015-07-31 MED ORDER — ONDANSETRON HCL 4 MG PO TABS: 4.0000 mg | ORAL_TABLET | Freq: Four times a day (QID) | ORAL | Status: DC | PRN

## 2015-07-31 MED ORDER — ADULT MULTIVITAMIN W/MINERALS CH
1.0000 | ORAL_TABLET | Freq: Every day | ORAL | Status: DC
  Administered 2015-07-31 – 2015-08-02 (×3): 1 via ORAL
  Filled 2015-07-31 (×3): qty 1

## 2015-07-31 MED ORDER — ENOXAPARIN SODIUM 30 MG/0.3ML ~~LOC~~ SOLN
30.0000 mg | Freq: Every day | SUBCUTANEOUS | Status: DC
  Administered 2015-07-31 – 2015-08-02 (×3): 30 mg via SUBCUTANEOUS
  Filled 2015-07-31 (×3): qty 0.3

## 2015-07-31 MED ORDER — POTASSIUM CHLORIDE CRYS ER 20 MEQ PO TBCR
20.0000 meq | EXTENDED_RELEASE_TABLET | Freq: Once | ORAL | Status: AC
  Administered 2015-07-31: 20 meq via ORAL
  Filled 2015-07-31: qty 1

## 2015-07-31 MED ORDER — ACETAMINOPHEN 650 MG RE SUPP: 650.0000 mg | Freq: Four times a day (QID) | RECTAL | Status: DC | PRN

## 2015-07-31 MED ORDER — POTASSIUM CHLORIDE CRYS ER 20 MEQ PO TBCR
40.0000 meq | EXTENDED_RELEASE_TABLET | Freq: Once | ORAL | Status: AC
  Administered 2015-07-31: 40 meq via ORAL
  Filled 2015-07-31: qty 2

## 2015-07-31 MED ORDER — CARVEDILOL 12.5 MG PO TABS
12.5000 mg | ORAL_TABLET | Freq: Two times a day (BID) | ORAL | Status: DC
  Administered 2015-07-31 – 2015-08-01 (×3): 12.5 mg via ORAL
  Filled 2015-07-31 (×8): qty 1

## 2015-07-31 MED ORDER — ONDANSETRON HCL 4 MG/2ML IJ SOLN: 4.0000 mg | Freq: Four times a day (QID) | INTRAMUSCULAR | Status: DC | PRN

## 2015-07-31 MED ORDER — ASPIRIN EC 81 MG PO TBEC
81.0000 mg | DELAYED_RELEASE_TABLET | Freq: Every day | ORAL | Status: DC
  Administered 2015-07-31 – 2015-08-02 (×3): 81 mg via ORAL
  Filled 2015-07-31 (×3): qty 1

## 2015-07-31 MED ORDER — ATORVASTATIN CALCIUM 10 MG PO TABS
10.0000 mg | ORAL_TABLET | Freq: Every day | ORAL | Status: DC
  Administered 2015-07-31 – 2015-08-01 (×2): 10 mg via ORAL
  Filled 2015-07-31 (×3): qty 1

## 2015-07-31 MED ORDER — ENOXAPARIN SODIUM 40 MG/0.4ML ~~LOC~~ SOLN
40.0000 mg | SUBCUTANEOUS | Status: DC
Start: 2015-07-31 — End: 2015-07-31

## 2015-07-31 MED ORDER — ACETAMINOPHEN 325 MG PO TABS: 650.0000 mg | ORAL_TABLET | Freq: Four times a day (QID) | ORAL | Status: DC | PRN

## 2015-07-31 MED ORDER — SODIUM CHLORIDE 0.9 % IV SOLN
INTRAVENOUS | Status: AC
  Administered 2015-07-31: 17:00:00 via INTRAVENOUS

## 2015-07-31 NOTE — Clinical Documentation Improvement (Signed)
Internal Medicine  Abnormal Lab/Test Results:  07/30/15, 07/31/15: K= 3.0  Possible Clinical Conditions associated with below indicators  Hypokalemia  Other Condition  Cannot Clinically Determine  Treatment Provided: KCL 20 mEq po ordered KCL 40 mEq po ordered   Please exercise your independent, professional judgment when responding. A specific answer is not anticipated or expected.   Thank You,  Rolm Gala, RN, [place] [phone]

## 2015-07-31 NOTE — Progress Notes (Signed)
SMOG Enema given with good result. Will continue to monitor.

## 2015-07-31 NOTE — Clinical Documentation Improvement (Signed)
Internal Medicine  Can the diagnosis of CKD be further specified?   CKD Stage I - GFR greater than or equal to 90  CKD Stage II - GFR 60-89  CKD Stage III - GFR 30-59  CKD Stage IV - GFR 15-29  CKD Stage V - GFR < 15  ESRD (End Stage Renal Disease)  Other condition  Unable to clinically determine   Supporting Information: : (risk factors, signs and symptoms, diagnostics, treatment) GFR= 41, 44  Please exercise your independent, professional judgment when responding. A specific answer is not anticipated or expected.   Thank You, [place] [phone]

## 2015-07-31 NOTE — Progress Notes (Signed)
I have seen and assessed patient and agree with Dr Moise Boring assessment and plan. Check urine cultures. Patient with constipation/fecal impaction noted on Ct scan. Patient with no results after soap suds enema. Will give a SMOG enema. IVF. Continue IV antibiotics. Supportive care.

## 2015-07-31 NOTE — Evaluation (Signed)
Physical Therapy Evaluation Patient Details Name: Ashlee Mack MRN: [id number] DOB: 1940-12-06 Today's Date: 07/31/2015   History of Present Illness  Ashlee Mack is a 74 year old female with history of hypertension, hyperlipidemia, chronic kidney disease and anemia admitted for UTI, generalized weakness   Clinical Impression  Ashlee Mack admitted with above diagnosis. Ashlee Mack currently with functional limitations due to the deficits listed below (see Ashlee Mack Problem List).  Ashlee Mack will benefit from skilled Ashlee Mack to increase their independence and safety with mobility to allow discharge to the venue listed below.   Ashlee Mack ambulated in hallway with RW.  May benefit from [facility] aide.     Follow Up Recommendations Home health Ashlee Mack;Supervision for mobility/OOB    Equipment Recommendations  None recommended by Ashlee Mack    Recommendations for Other Services       Precautions / Restrictions Precautions Precautions: Fall Restrictions Weight Bearing Restrictions: No      Mobility  Bed Mobility Overal bed mobility: Needs Assistance Bed Mobility: Supine to Sit     Supine to sit: Min guard     General bed mobility comments: increased time  Transfers Overall transfer level: Needs assistance Equipment used: Rolling walker (2 wheeled) Transfers: Sit to/from Stand Sit to Stand: Min guard         General transfer comment: verbal cues for safe technique  Ambulation/Gait Ambulation/Gait assistance: Min guard Ambulation Distance (Feet): 160 Feet Assistive device: Rolling walker (2 wheeled) Gait Pattern/deviations: Step-through pattern;Decreased stride length     General Gait Details: slow but steady pace with RW, slighty difficulty manuevering RW  Stairs            Wheelchair Mobility    Modified Rankin (Stroke Patients Only)       Balance Overall balance assessment: Needs assistance (spouse reports no recent falls)         Standing balance support: Bilateral upper extremity supported Standing balance-Leahy  Scale: Poor Standing balance comment: requires RW                             Pertinent Vitals/Pain Pain Assessment: No/denies pain    Home Living Family/patient expects to be discharged to:: Private residence Living Arrangements: Spouse/significant other   Type of Home: Apartment       Home Layout: One level Home Equipment: Environmental consultant - 2 wheels      Prior Function Level of Independence: Needs assistance   Gait / Transfers Assistance Needed: uses RW  ADL's / Homemaking Assistance Needed: spouse states she requires assist for ADLs sometimes, "does what she can"        Hand Dominance        Extremity/Trunk Assessment   Upper Extremity Assessment: Generalized weakness           Lower Extremity Assessment: Generalized weakness      Cervical / Trunk Assessment: Kyphotic  Communication   Communication: No difficulties  Cognition Arousal/Alertness: Awake/alert Behavior During Therapy: Flat affect Overall Cognitive Status: Within Functional Limits for tasks assessed                      General Comments      Exercises        Assessment/Plan    Ashlee Mack Assessment Patient needs continued Ashlee Mack services  Ashlee Mack Diagnosis Difficulty walking   Ashlee Mack Problem List Decreased strength;Decreased activity tolerance;Decreased mobility  Ashlee Mack Treatment Interventions DME instruction;Gait training;Patient/family education;Functional mobility training;Therapeutic activities;Therapeutic exercise   Ashlee Mack Goals (Current goals  can be found in the Care Plan section) Acute Rehab Ashlee Mack Goals Ashlee Mack Goal Formulation: With patient Time For Goal Achievement: 08/14/15 Potential to Achieve Goals: Good    Frequency Min 3X/week   Barriers to discharge        Co-evaluation               End of Session Equipment Utilized During Treatment: Gait belt Activity Tolerance: Patient tolerated treatment well Patient left: with call bell/phone within reach;in chair;with chair alarm  set;with family/visitor present           Time: [id number] Ashlee Mack Time Calculation (min) (ACUTE ONLY): 17 min   Charges:   Ashlee Mack Evaluation $Initial Ashlee Mack Evaluation Tier I: 1 Procedure     Ashlee Mack G Codes:        Ashlee Mack,Ashlee Mack 07/31/2015, 10:19 AM Ashlee Mack, Ashlee Mack, Ashlee Mack 07/31/2015 Pager: [phone]

## 2015-07-31 NOTE — Progress Notes (Signed)
Rx Brief note:  Lovenox Wt=49 kg CrCl~27 ml/min  Rx adjusted Lovenox to 30mg  daily in pt with CrCl<30  Thanks Dorrene German 07/31/2015 12:38 AM

## 2015-07-31 NOTE — Progress Notes (Addendum)
ANTIBIOTIC CONSULT NOTE - INITIAL  Pharmacy Consult for Rocephin Indication: UTI  No Known Allergies  Patient Measurements: Height: 5\' 8"  (172.7 cm) Weight: 110 lb (49.896 kg) IBW/kg (Calculated) : 63.9   Vital Signs: Temp: 98.1 F (36.7 C) (11/16 2240) Temp Source: Oral (11/16 2240) BP: 125/67 mmHg (11/16 2240) Pulse Rate: 70 (11/16 2240) Intake/Output from previous day:   Intake/Output from this shift:    Labs:  Recent Labs  07/30/15 1533  WBC 9.9  HGB 9.2*  PLT 777*  CREATININE 1.42*   Estimated Creatinine Clearance: 27.8 mL/min (by C-G formula based on Cr of 1.42). No results for input(s): VANCOTROUGH, VANCOPEAK, VANCORANDOM, GENTTROUGH, GENTPEAK, GENTRANDOM, TOBRATROUGH, TOBRAPEAK, TOBRARND, AMIKACINPEAK, AMIKACINTROU, AMIKACIN in the last 72 hours.   Microbiology: No results found for this or any previous visit (from the past 720 hour(s)).  Medical History: Past Medical History  Diagnosis Date  . Coronary artery disease   . Hypertension     Medications:  Prescriptions prior to admission  Medication Sig Dispense Refill Last Dose  . amLODipine (NORVASC) 10 MG tablet Take 1 tablet (10 mg total) by mouth daily. 30 tablet 5   . aspirin EC 81 MG tablet Take 1 tablet (81 mg total) by mouth daily. 30 tablet 5 07/30/2015 at Unknown time  . atorvastatin (LIPITOR) 10 MG tablet Take 1 tablet (10 mg total) by mouth daily. For cholesterol 30 tablet 0   . carvedilol (COREG) 12.5 MG tablet Take 12.5 mg by mouth 2 (two) times daily with a meal.     . chlorthalidone (HYGROTON) 25 MG tablet Take 25 mg by mouth daily.     . feeding supplement, ENSURE COMPLETE, (ENSURE COMPLETE) LIQD Take 237 mLs by mouth 2 (two) times daily between meals.   07/30/2015 at Unknown time  . ferrous sulfate 325 (65 FE) MG tablet Take 1 tablet (325 mg total) by mouth daily with breakfast. 30 tablet 3   . polyethylene glycol powder (GLYCOLAX/MIRALAX) powder Take 17 g by mouth daily as needed. For  constipation associated with iron use 850 g 3 unknown  . ciprofloxacin (CIPRO) 250 MG tablet Take 1 tablet (250 mg total) by mouth 2 (two) times daily. 12 tablet 0 Taking   Scheduled:  . amLODipine  10 mg Oral Daily  . aspirin EC  81 mg Oral Daily  . atorvastatin  10 mg Oral Daily  . carvedilol  12.5 mg Oral BID WC  . cefTRIAXone (ROCEPHIN)  IV  1 g Intravenous QHS  . enoxaparin (LOVENOX) injection  30 mg Subcutaneous Daily  . feeding supplement (ENSURE ENLIVE)  237 mL Oral BID BM  . ferrous sulfate  325 mg Oral Q breakfast   Infusions:  . sodium chloride     Assessment: 74 yoF c/o weakness/diarrhea.  Rocephin per Rx for UTI.   Goal of Therapy:  Treat UTI  Plan:   Rocephin 1Gm IV q24h  F/u SCr/cultures  No additional adjustments needed , Rx will sign off  Lawana Pai R 07/31/2015,12:46 AM

## 2015-07-31 NOTE — Progress Notes (Signed)
Received report from 5w RN, Pt arrived unit accompanied by family. MD aware of Pt's location. Will continue with current plan of care.

## 2015-07-31 NOTE — H&P (Addendum)
[facility] History and Physical  Lashante Gibbon [id number] DOB: [birth year] DOA: 07/30/2015  Referring physician: Dr.Pickering. PCP: Lance Bosch, NP  Specialists: None.  Chief Complaint: Weakness.  Patient is a poor historian.  HPI: Ashlee Mack is a 74 y.o. female with history of hypertension, hyperlipidemia, chronic kidney disease and anemia presents to the ER because of generalized weakness and poor appetite over the last 1 week. As per the patient patient has been having poor appetite with some epigastric discomfort over the last 1 week. Denies any nausea vomiting or diarrhea. In the ER patient is found to have urine compatible with UTI. Labs also revealed mildly elevated lipase. Since patient is feeling weak at this time patient has been admitted for IV fluids and antibiotics for UTI. CT abdomen and pelvis shows cholelithiasis with no evidence of cholecystitis. In addition to also shows constipation with fecal impaction. Patient otherwise denies any chest pain or shortness of breath. Patient denies losing consciousness or any focal deficits.   Review of Systems: As presented in the history of presenting illness, rest negative.  Past Medical History  Diagnosis Date  . Coronary artery disease   . Hypertension    Past Surgical History  Procedure Laterality Date  . Coronary angioplasty with stent placement     Social History:  reports that she has never smoked. She does not have any smokeless tobacco history on file. She reports that she does not drink alcohol or use illicit drugs. Where does patient live home. Can patient participate in ADLs? Yes.  No Known Allergies  Family History:  Family History  Problem Relation Age of Onset  . Hypertension Mother   . Hypertension Father       Prior to Admission medications   Medication Sig Start Date End Date Taking? Authorizing Provider  amLODipine (NORVASC) 10 MG tablet Take 1 tablet (10 mg total) by mouth daily. 05/20/15   Yes Lance Bosch, NP  aspirin EC 81 MG tablet Take 1 tablet (81 mg total) by mouth daily. 12/09/14  Yes Lance Bosch, NP  atorvastatin (LIPITOR) 10 MG tablet Take 1 tablet (10 mg total) by mouth daily. For cholesterol 05/20/15  Yes Lance Bosch, NP  carvedilol (COREG) 12.5 MG tablet Take 12.5 mg by mouth 2 (two) times daily with a meal.   Yes Historical Provider, MD  chlorthalidone (HYGROTON) 25 MG tablet Take 25 mg by mouth daily.   Yes Historical Provider, MD  feeding supplement, ENSURE COMPLETE, (ENSURE COMPLETE) LIQD Take 237 mLs by mouth 2 (two) times daily between meals. 08/29/14  Yes Barton Dubois, MD  ferrous sulfate 325 (65 FE) MG tablet Take 1 tablet (325 mg total) by mouth daily with breakfast. 05/20/15  Yes Lance Bosch, NP  polyethylene glycol powder (GLYCOLAX/MIRALAX) powder Take 17 g by mouth daily as needed. For constipation associated with iron use 05/20/15  Yes Lance Bosch, NP  ciprofloxacin (CIPRO) 250 MG tablet Take 1 tablet (250 mg total) by mouth 2 (two) times daily. 02/12/15   Janece Canterbury, MD    Physical Exam: Filed Vitals:   07/30/15 1508 07/30/15 1833 07/30/15 2240  BP: 133/77 131/84 125/67  Pulse: 82 75 70  Temp: 98.8 F (37.1 C)  98.1 F (36.7 C)  TempSrc: Oral  Oral  Resp: 18 14 13   Height:   5\' 8"  (1.727 m)  Weight:   49.896 kg (110 lb)  SpO2: 99% 100% 100%     General:  Moderately built and poorly  nourished.  Eyes: Anicteric no pallor.  ENT: No discharge from the ears eyes nose and mouth.  Neck: No mass felt.  Cardiovascular: S1 and S2 heard.  Respiratory: No rhonchi or crepitations.  Abdomen: Soft nontender bowel sounds present.  Skin: No rash.  Musculoskeletal: No edema.  Psychiatric: Appears normal.  Neurologic: Alert awake oriented to time place and person. Moves all extremities.  Labs on Admission:  Basic Metabolic Panel:  Recent Labs Lab 07/30/15 1533  NA 139  K 3.0*  CL 99*  CO2 28  GLUCOSE 119*  BUN 34*   CREATININE 1.42*  CALCIUM 9.6   Liver Function Tests:  Recent Labs Lab 07/30/15 1533  AST 48*  ALT 37  ALKPHOS 61  BILITOT 0.7  PROT 9.0*  ALBUMIN 4.2    Recent Labs Lab 07/30/15 1533  LIPASE 56*   No results for input(s): AMMONIA in the last 168 hours. CBC:  Recent Labs Lab 07/30/15 1533  WBC 9.9  HGB 9.2*  HCT 29.2*  MCV 73.9*  PLT 777*   Cardiac Enzymes: No results for input(s): CKTOTAL, CKMB, CKMBINDEX, TROPONINI in the last 168 hours.  BNP (last 3 results)  Recent Labs  02/09/15 2017  BNP 79.5    ProBNP (last 3 results) No results for input(s): PROBNP in the last 8760 hours.  CBG:  Recent Labs Lab 07/30/15 1604  GLUCAP 113*    Radiological Exams on Admission: Ct Abdomen Pelvis W Contrast  07/30/2015  CLINICAL DATA:  Lower abdominal pain, worsening weakness. EXAM: CT ABDOMEN AND PELVIS WITH CONTRAST TECHNIQUE: Multidetector CT imaging of the abdomen and pelvis was performed using the standard protocol following bolus administration of intravenous contrast. CONTRAST:  1mL OMNIPAQUE IOHEXOL 300 MG/ML SOLN, 52mL OMNIPAQUE IOHEXOL 300 MG/ML SOLN COMPARISON:  02/09/2015 unenhanced CT abdomen/pelvis. FINDINGS: Lower chest: No significant pulmonary nodules or acute consolidative airspace disease. Left anterior descending coronary atherosclerosis. Hepatobiliary: Simple 0.8 cm right liver lobe cyst. At least 4 additional subcentimeter hypodense liver lesions, too small to characterize. There is a 1.0 cm peripherally calcified gallstone layering in the nondistended gallbladder, with no gallbladder wall thickening or pericholecystic fat stranding. Stable mild diffuse intrahepatic biliary ductal dilatation. Stable mildly dilated common bile duct (7 mm diameter), with smooth distal tapering and no radiopaque choledocholithiasis. Pancreas: Normal, with no mass or duct dilation. Spleen: Normal size. No mass. Adrenals/Urinary Tract: Normal adrenals. There are small  simple bilateral renal cysts, largest 1.1 cm in the lower right kidney and 1.6 cm in the lower left kidney. There are innumerable additional subcentimeter hypodense renal lesions throughout both kidneys, too small to characterize. There is stable mild fullness of the right renal collecting system without overt right hydronephrosis. There are small extrarenal pelves bilaterally, unchanged. No left hydronephrosis. Borderline mild diffuse bladder wall thickening. No focal bladder abnormality. Stomach/Bowel: Grossly normal stomach. Normal caliber small bowel with no small bowel wall thickening. Appendix is not discretely visualized. There is a large amount of stool throughout the colon, with no colonic wall thickening or colonic diverticulosis. The rectum is prominently distended by stool up to a diameter of 6.6 cm. There is no appreciable rectal wall thickening or pneumatosis. Vascular/Lymphatic: Normal caliber abdominal aorta. Patent portal, splenic, hepatic and renal veins. No pathologically enlarged lymph nodes in the abdomen or pelvis. Reproductive: The endometrial cavity is distended by gas, which is a chronic finding. Other: No pneumoperitoneum, ascites or focal fluid collection. Musculoskeletal: No aggressive appearing focal osseous lesions. Mild-to-moderate degenerative changes in the visualized thoracolumbar  spine. Stable moderate dextroscoliosis of the thoracolumbar spine. IMPRESSION: 1. No evidence of bowel obstruction or acute bowel inflammation. 2. Prominent colorectal stool volume, suggesting constipation, with likely fecal impaction in the rectum. No evidence of stercoral colitis. 3. Borderline mild diffuse bladder wall thickening, recommend correlation with urinalysis to exclude acute cystitis. 4. Cholelithiasis. No evidence of acute cholecystitis. Stable mild chronic biliary ductal dilatation, with no radiopaque choledocholithiasis. Recommend correlation with serum bilirubin levels. 5. Chronic gaseous  distention of the endometrial cavity, a finding of uncertain significance. There is no uterine hyperenhancement to suggest acute gynecologic infection. There is no evidence of a uterine fistula to the bowel. Electronically Signed   By: Ilona Sorrel M.D.   On: 07/30/2015 19:21    EKG: Independently reviewed. Normal sinus rhythm with prolonged QT interval at 578 ms. Nonspecific ST-T changes.  Assessment/Plan Principal Problem:   Generalized weakness Active Problems:   UTI (lower urinary tract infection)   Essential hypertension   Chronic renal insufficiency   Chronic anemia   1. Generalized weakness - probably secondary to poor oral intake and UTI. Continue with gentle hydration physical therapy consult and treatment for UTI. Gentle hydration. Hold diuretics. 2. UTI - patient has been placed on ceftriaxone. Follow urine cultures. 3. Poor appetite with cholelithiasis and epigastric discomfort/ ? Pancreatitis - I have ordered high HIDA scan to rule out cholecystitis. Lipase is mildly elevated but CAT scan does not show any definite signs of pancreatitis. Follow lipase levels. 4. Constipation with fecal impaction - probably causing abdominal discomfort also. I have ordered soapsuds edema. 5. Hypertension - will hold chlorthalidone due to dehydration. Continue other antihypertensives. 6. Chronic anemia - follow CBC. 7. Hyperlipidemia on statins - check CK due to weakness. 8. Prolonged QT interval - avoid QT prolongation medications. Correct potassium and check magnesium. 9. Chronic gaseous distention of the endometrial cavity of unknown significance -  presently on antibiotics for UTI. Will need outpatient follow-up. 10. Hyperglycemia - check hemoglobin A1c. 11. History of CAD - on aspirin and statins. Denies any chest pain but since patient complains of epigastric pain we will check troponin.  I have reviewed patient's old charts and labs. Personally reviewed EKG. Chest x-ray is  pending.   DVT ProphylaxisLovenox. Code Status: DO NOT RESUSCITATE. Family Communication: Discussed with patient's husband.  Disposition Plan: Admit to inpatient.    Jaslen Adcox N. [facility] Pager [phone].  If 7PM-7AM, please contact night-coverage [email or web address] Password [email or web address] 07/31/2015, 12:29 AM

## 2015-07-31 NOTE — Progress Notes (Addendum)
Initial Nutrition Assessment  DOCUMENTATION CODES:   Severe malnutrition in context of acute illness/injury, Underweight  INTERVENTION:   -Continue Ensure Enlive po BID, each supplement provides 350 kcal and 20 grams of protein -Provide Magic cup TID with meals, each supplement provides 290 kcal and 9 grams of protein -Provide daily snack -Multivitamin with minerals daily -Encourage PO intake -RD to continue to monitor  NUTRITION DIAGNOSIS:   Malnutrition related to acute illness, poor appetite as evidenced by percent weight loss, energy intake < or equal to 50% for > or equal to 5 days, severe depletion of body fat, severe depletion of muscle mass.  GOAL:   Patient will meet greater than or equal to 90% of their needs  MONITOR:   PO intake, Supplement acceptance, Labs, Weight trends, Skin, I & O's  REASON FOR ASSESSMENT:   Other (Comment) (Low BMI)    ASSESSMENT:   74 y.o. female with history of hypertension, hyperlipidemia, chronic kidney disease and anemia presents to the ER because of generalized weakness and poor appetite over the last 1 week. As per the patient patient has been having poor appetite with some epigastric discomfort over the last 1 week.  Patient in room with husband at bedside. Pt's husband is hard of hearing and patient seemed confused by some questions. Per husband, pt consumes very little and has 1 Ensure a day with her medications. She starts her day with the ensure then she may have a 1-2 bites of sandwich and eats very little dinner. This has lasted for the last 3-4 weeks.  Pt's diet has been advanced to soft and she received a lunch tray during RD visit. RD offered to assist patient with tray set-up but patient was not ready to eat yet. Patient did drink 1/2 her Ensure today.  Encouraged pt's husband to provide at least 2 Ensures for patient daily.  Pt willing to try and eats bites of ice cream and pudding. Patient would also benefit from addition of  multi-vitamin. RD to order.  Per weight history, pt has lost 6 lb since 9/06 (6% weight loss x 2 months, significant for time frame).  Nutrition-Focused physical exam completed. Findings are severe fat depletion, severe muscle depletion, and no edema.   Labs reviewed: Low K Elevated BUN & Creatinine Mg WNL  Diet Order:  Diet NPO time specified DIET SOFT Room service appropriate?: Yes; Fluid consistency:: Thin  Skin:  Wound (see comment) (stage I sacral ulcer)  Last BM:  11/16  Height:   Ht Readings from Last 1 Encounters:  07/30/15 5\' 8"  (1.727 m)    Weight:   Wt Readings from Last 1 Encounters:  07/31/15 101 lb 12.8 oz (46.176 kg)    Ideal Body Weight:  63.6 kg  BMI:  Body mass index is 15.48 kg/(m^2).  Estimated Nutritional Needs:   Kcal:  1400-1600  Protein:  70-80g  Fluid:  1.6L/day  EDUCATION NEEDS:   Education needs addressed  Clayton Bibles, MS, RD, LDN Pager: [phone] After Hours Pager: [phone]

## 2015-08-01 ENCOUNTER — Inpatient Hospital Stay (HOSPITAL_COMMUNITY): Payer: Medicare Other

## 2015-08-01 ENCOUNTER — Telehealth: Payer: Self-pay

## 2015-08-01 DIAGNOSIS — K5641 Fecal impaction: Secondary | ICD-10-CM

## 2015-08-01 DIAGNOSIS — K5900 Constipation, unspecified: Secondary | ICD-10-CM

## 2015-08-01 DIAGNOSIS — N390 Urinary tract infection, site not specified: Secondary | ICD-10-CM | POA: Insufficient documentation

## 2015-08-01 LAB — CBC
HCT: 26.8 % — ABNORMAL LOW (ref 36.0–46.0)
Hemoglobin: 8.3 g/dL — ABNORMAL LOW (ref 12.0–15.0)
MCH: 23.3 pg — AB (ref 26.0–34.0)
MCHC: 31 g/dL (ref 30.0–36.0)
MCV: 75.3 fL — ABNORMAL LOW (ref 78.0–100.0)
PLATELETS: 594 10*3/uL — AB (ref 150–400)
RBC: 3.56 MIL/uL — AB (ref 3.87–5.11)
RDW: 27.8 % — ABNORMAL HIGH (ref 11.5–15.5)
WBC: 9.3 10*3/uL (ref 4.0–10.5)

## 2015-08-01 LAB — URINE CULTURE: CULTURE: NO GROWTH

## 2015-08-01 LAB — BASIC METABOLIC PANEL
Anion gap: 10 (ref 5–15)
BUN: 26 mg/dL — ABNORMAL HIGH (ref 6–20)
CALCIUM: 8.9 mg/dL (ref 8.9–10.3)
CO2: 29 mmol/L (ref 22–32)
CREATININE: 1.28 mg/dL — AB (ref 0.44–1.00)
Chloride: 101 mmol/L (ref 101–111)
GFR calc non Af Amer: 40 mL/min — ABNORMAL LOW (ref >60)
GFR, EST AFRICAN AMERICAN: 47 mL/min — AB (ref >60)
Glucose, Bld: 95 mg/dL (ref 65–99)
Potassium: 3.5 mmol/L (ref 3.5–5.1)
SODIUM: 140 mmol/L (ref 135–145)

## 2015-08-01 LAB — HEMOGLOBIN A1C
Hgb A1c MFr Bld: 6.4 % — ABNORMAL HIGH (ref 4.8–5.6)
Mean Plasma Glucose: 137 mg/dL

## 2015-08-01 MED ORDER — POLYETHYLENE GLYCOL 3350 17 G PO PACK
17.0000 g | PACK | Freq: Two times a day (BID) | ORAL | Status: DC
  Administered 2015-08-01 – 2015-08-02 (×3): 17 g via ORAL
  Filled 2015-08-01 (×4): qty 1

## 2015-08-01 MED ORDER — SENNOSIDES-DOCUSATE SODIUM 8.6-50 MG PO TABS
1.0000 | ORAL_TABLET | Freq: Every day | ORAL | Status: DC
  Administered 2015-08-01: 1 via ORAL
  Filled 2015-08-01 (×2): qty 1

## 2015-08-01 MED ORDER — TECHNETIUM TC 99M MEBROFENIN IV KIT
5.5000 | PACK | Freq: Once | INTRAVENOUS | Status: DC | PRN
  Administered 2015-08-01: 5.5 via INTRAVENOUS
  Filled 2015-08-01: qty 6

## 2015-08-01 NOTE — Telephone Encounter (Signed)
Transitional Care Clinic at [place]:  Patient known to [facility] and [facility].  PCP: Chari Manning, NP.  Spoke with patient and informed her of the [facility] [place] and informed her of the medical management and follow-up provided at the clinic. Patient indicated her son, Matelyn Mcfail [id number]), makes her decisions, and she gave verbal consent for this Case Manager to call her son.    Call placed to patient's son, Evalet Luttrull, and also informed him of the services and medical management provided at the [facility].  Patient's son indicated he was working on getting patient and his father into an [place] because patients needs 24 hour care.  He indicated he was going to speak with patient's physician today. Informed patient's son he should also speak with an inpatient Social Worker about plan to get patient in an [place] after discharge. Patient's son verbalized understanding. Informed patient's son that patient could still be part of the [facility] even if she moves into an [place]. Patient's son requested time to think about the Transitional Care program at [place]. Informed patient's son that a Transitional Care Case Manager will follow up with him at a later time.  He verbalized understanding and was appreciative of call.    This Case Manager placed call to Velva Harman, RN CM and updated her on conversation with patient's son.  She indicated she would put in a Social Work consult to assist son with [facility] placement. Will continue to follow patient's clinical progress.

## 2015-08-01 NOTE — Care Management Important Message (Signed)
Important Message  Patient Details  Name: Kopelynn Babel MRN: [id number] Date of Birth: 09-20-1940   Medicare Important Message Given:  Yes    Shelda Altes 08/01/2015, 12:55 [place] Message  Patient Details  Name: Trenisha Runck MRN: [id number] Date of Birth: [birth year]   Medicare Important Message Given:  Yes    Shelda Altes 08/01/2015, 12:55 PM

## 2015-08-01 NOTE — Progress Notes (Signed)
[facility] PROGRESS NOTE  Ashlee Mack [id number] DOB: 1941-01-15 DOA: 07/30/2015 PCP: Lance Bosch, NP  Assessment/Plan: #1 generalized weakness Likely secondary to poor oral intake and urinary tract infection. Patient with clinical improvement. Urine cultures pending. Continue empiric IV Rocephin. Gentle hydration. Diuretics on hold. PT/OT. Outpatient follow-up.  #2 urinary tract infection Urine cultures negative. Urine cultures were obtained after IV antibiotics were started. Urinalysis had moderate leukocytes negative nitrite too numerous to count WBCs. Patient with significant clinical improvement. Continue IV Rocephin. Will transition to oral antibiotics tomorrow.  #3 constipation/fecal impaction Patient was given soapsuds enema with no results. Patient was given a SMOG enema with significant results. Will place on MiraLAX twice daily and Senokot-S at bedtime for bowel regimen. Outpatient follow-up.  #4 chronic anemia Stable.  #5 hypertension Antihypertensive medications on hold secondary to dehydration. BP stable. Follow.  #6 hyperlipidemia On statin.  #7 gaseous distention of the endometrial cavity of unknown significance Outpatient follow-up.  #8 history of coronary artery disease Stable. Continue aspirin and statin.  #9.Poor appetite cholelithiasis and epigastric discomfort/? Pancreatitis Patient with clinical improvement. CT scan with no definite signs of pancreatitis. Lipase levels have trended down. HIDA scan with no cystic duct obstruction. No CBD obstruction. Continue supportive care. Follow.  #10 prophylaxis Lovenox for DVT prophylaxis.   Code Status: DO NOT RESUSCITATE Family Communication: Updated patient and husband at bedside. Disposition Plan: Home with home health will fully 1-2 days.   Consultants:  None  Procedures:  HIDA scan 08/01/2015  CT abd/pelvis 07/30/2015  CXR 07/31/2015  Antibiotics:  IV Rocephin  07/31/2015  HPI/Subjective: Patient states she's feeling better. Patient with good bowel movement last night. No abdominal pain. No nausea no vomiting. Patient states weakness has improved.  Objective: Filed Vitals:   08/01/15 1220  BP: 125/62  Pulse: 58  Temp: 97.8 F (36.6 C)  Resp: 18    Intake/Output Summary (Last 24 hours) at 08/01/15 1814 Last data filed at 08/01/15 0900  Gross per 24 hour  Intake      0 ml  Output      0 ml  Net      0 ml   Filed Weights   07/30/15 2240 07/31/15 0544  Weight: 49.896 kg (110 lb) 46.176 kg (101 lb 12.8 oz)    Exam:   General:  NAD  Cardiovascular: RRR  Respiratory: CTAB  Abdomen: Soft, nontender, nondistended, positive bowel sounds.  Musculoskeletal: No clubbing cyanosis or edema.  Data Reviewed: Basic Metabolic Panel:  Recent Labs Lab 07/30/15 1533 07/31/15 0115 07/31/15 0415 08/01/15 0509  NA 139 136  --  140  K 3.0* 3.0*  --  3.5  CL 99* 97*  --  101  CO2 28 28  --  29  GLUCOSE 119* 210*  --  95  BUN 34* 28*  --  26*  CREATININE 1.42* 1.36*  --  1.28*  CALCIUM 9.6 8.6*  --  8.9  MG  --   --  2.4  --    Liver Function Tests:  Recent Labs Lab 07/30/15 1533 07/31/15 0115  AST 48* 42*  ALT 37 32  ALKPHOS 61 54  BILITOT 0.7 0.3  PROT 9.0* 7.9  ALBUMIN 4.2 3.6    Recent Labs Lab 07/30/15 1533 07/31/15 0415  LIPASE 56* 46   No results for input(s): AMMONIA in the last 168 hours. CBC:  Recent Labs Lab 07/30/15 1533 07/31/15 0115 08/01/15 0509  WBC 9.9 9.8 9.3  HGB 9.2* 8.6*  8.3*  HCT 29.2* 27.1* 26.8*  MCV 73.9* 75.3* 75.3*  PLT 777* 585* 594*   Cardiac Enzymes:  Recent Labs Lab 07/31/15 0115  CKTOTAL 37*  TROPONINI <0.03   BNP (last 3 results)  Recent Labs  02/09/15 2017  BNP 79.5    ProBNP (last 3 results) No results for input(s): PROBNP in the last 8760 hours.  CBG:  Recent Labs Lab 07/30/15 1604  GLUCAP 113*    Recent Results (from the past 240 hour(s))   Culture, Urine     Status: None   Collection Time: 07/31/15 10:55 AM  Result Value Ref Range Status   Specimen Description URINE, CLEAN CATCH  Final   Special Requests NONE  Final   Culture   Final    NO GROWTH 1 DAY Performed at [facility]    Report Status 08/01/2015 FINAL  Final     Studies: Nm Hepatobiliary Liver Func  08/01/2015  CLINICAL DATA:  Generalized weakness, epigastric pain for 1 week EXAM: NUCLEAR MEDICINE HEPATOBILIARY IMAGING TECHNIQUE: Sequential images of the abdomen were obtained out to 60 minutes following intravenous administration of radiopharmaceutical. RADIOPHARMACEUTICALS:  5.5 mCi Tc-67m  Choletec IV COMPARISON:  CT scan 07/30/2015 FINDINGS: There is normal uptake of the tracer by the liver. Gallbladder visualized at 35 minutes. CBD visualized at 41 minutes. Bowel activity noted at 60 minutes. IMPRESSION: 1. No cystic duct obstruction.  No CBD obstruction. Electronically Signed   By: Lahoma Crocker M.D.   On: 08/01/2015 12:04   Ct Abdomen Pelvis W Contrast  07/30/2015  CLINICAL DATA:  Lower abdominal pain, worsening weakness. EXAM: CT ABDOMEN AND PELVIS WITH CONTRAST TECHNIQUE: Multidetector CT imaging of the abdomen and pelvis was performed using the standard protocol following bolus administration of intravenous contrast. CONTRAST:  12mL OMNIPAQUE IOHEXOL 300 MG/ML SOLN, 38mL OMNIPAQUE IOHEXOL 300 MG/ML SOLN COMPARISON:  02/09/2015 unenhanced CT abdomen/pelvis. FINDINGS: Lower chest: No significant pulmonary nodules or acute consolidative airspace disease. Left anterior descending coronary atherosclerosis. Hepatobiliary: Simple 0.8 cm right liver lobe cyst. At least 4 additional subcentimeter hypodense liver lesions, too small to characterize. There is a 1.0 cm peripherally calcified gallstone layering in the nondistended gallbladder, with no gallbladder wall thickening or pericholecystic fat stranding. Stable mild diffuse intrahepatic biliary ductal dilatation.  Stable mildly dilated common bile duct (7 mm diameter), with smooth distal tapering and no radiopaque choledocholithiasis. Pancreas: Normal, with no mass or duct dilation. Spleen: Normal size. No mass. Adrenals/Urinary Tract: Normal adrenals. There are small simple bilateral renal cysts, largest 1.1 cm in the lower right kidney and 1.6 cm in the lower left kidney. There are innumerable additional subcentimeter hypodense renal lesions throughout both kidneys, too small to characterize. There is stable mild fullness of the right renal collecting system without overt right hydronephrosis. There are small extrarenal pelves bilaterally, unchanged. No left hydronephrosis. Borderline mild diffuse bladder wall thickening. No focal bladder abnormality. Stomach/Bowel: Grossly normal stomach. Normal caliber small bowel with no small bowel wall thickening. Appendix is not discretely visualized. There is a large amount of stool throughout the colon, with no colonic wall thickening or colonic diverticulosis. The rectum is prominently distended by stool up to a diameter of 6.6 cm. There is no appreciable rectal wall thickening or pneumatosis. Vascular/Lymphatic: Normal caliber abdominal aorta. Patent portal, splenic, hepatic and renal veins. No pathologically enlarged lymph nodes in the abdomen or pelvis. Reproductive: The endometrial cavity is distended by gas, which is a chronic finding. Other: No pneumoperitoneum, ascites or focal  fluid collection. Musculoskeletal: No aggressive appearing focal osseous lesions. Mild-to-moderate degenerative changes in the visualized thoracolumbar spine. Stable moderate dextroscoliosis of the thoracolumbar spine. IMPRESSION: 1. No evidence of bowel obstruction or acute bowel inflammation. 2. Prominent colorectal stool volume, suggesting constipation, with likely fecal impaction in the rectum. No evidence of stercoral colitis. 3. Borderline mild diffuse bladder wall thickening, recommend  correlation with urinalysis to exclude acute cystitis. 4. Cholelithiasis. No evidence of acute cholecystitis. Stable mild chronic biliary ductal dilatation, with no radiopaque choledocholithiasis. Recommend correlation with serum bilirubin levels. 5. Chronic gaseous distention of the endometrial cavity, a finding of uncertain significance. There is no uterine hyperenhancement to suggest acute gynecologic infection. There is no evidence of a uterine fistula to the bowel. Electronically Signed   By: Ilona Sorrel M.D.   On: 07/30/2015 19:21   Dg Chest Port 1 View  07/31/2015  CLINICAL DATA:  Epigastric pain, weakness, diarrhea, hypertension, coronary artery disease EXAM: PORTABLE CHEST 1 VIEW COMPARISON:  Portable exam 0639 hours compared to 02/09/2015 FINDINGS: Upper normal heart size. Rotated to the LEFT. Mediastinal contours and pulmonary vascularity normal. Atherosclerotic calcification aorta. Lungs clear. Eventration RIGHT diaphragm again noted. No pleural effusion or pneumothorax. Bones demineralized. IMPRESSION: No acute abnormalities. Electronically Signed   By: Lavonia Dana M.D.   On: 07/31/2015 07:50    Scheduled Meds: . amLODipine  10 mg Oral Daily  . aspirin EC  81 mg Oral Daily  . atorvastatin  10 mg Oral Daily  . carvedilol  12.5 mg Oral BID WC  . cefTRIAXone (ROCEPHIN)  IV  1 g Intravenous QHS  . enoxaparin (LOVENOX) injection  30 mg Subcutaneous Daily  . feeding supplement (ENSURE ENLIVE)  237 mL Oral BID BM  . ferrous sulfate  325 mg Oral Q breakfast  . multivitamin with minerals  1 tablet Oral Daily  . polyethylene glycol  17 g Oral BID  . senna-docusate  1 tablet Oral QHS   Continuous Infusions:   Principal Problem:   Generalized weakness Active Problems:   UTI (lower urinary tract infection)   Essential hypertension   Constipation   Chronic renal insufficiency   Chronic anemia   Fecal impaction (HCC)    Time spent: 28 minutes    Chirstopher Iovino M.D. [facility] Pager [phone]. If 7PM-7AM, please contact night-coverage at [email or web address], password [facility] 08/01/2015, 6:14 PM  LOS: 2 days

## 2015-08-01 NOTE — Evaluation (Signed)
Occupational Therapy Evaluation Patient Details Name: Ashlee Mack MRN: [id number] DOB: 03/23/1941 Today's Date: 08/01/2015    History of Present Illness Pt is a 74 year old female with history of hypertension, hyperlipidemia, chronic kidney disease and anemia admitted for UTI, generalized weakness    Clinical Impression   Pt was admitted for the above.  She is moving with min guard assistance, but needs up to max A for LB adls.  She was incontinent x urine and did not call for assistance.  She will benefit from skilled OT to increase safety and independence with adls.  Goals in acute are for min guard to min A and include husband being able to assist with socks/shoes and guarding for safety.      Follow Up Recommendations  Home health OT;Supervision/Assistance - 24 hour [facility] aide; depending upon progress)  As long as pt progresses and she and husband can safely function at home   Equipment Recommendations  3 in 1 bedside comode    Recommendations for Other Services       Precautions / Restrictions Precautions Precautions: Fall Restrictions Weight Bearing Restrictions: No      Mobility Bed Mobility         Supine to sit: Min guard     General bed mobility comments: extra time  Transfers   Equipment used: Rolling walker (2 wheeled) Transfers: Sit to/from Stand Sit to Stand: Min guard         General transfer comment: for safety; cues for ue placement    Balance           Standing balance support: Bilateral upper extremity supported Standing balance-Leahy Scale: Poor Standing balance comment: pt did not feel she could release one hand in standing to assist with hygiene                            ADL Overall ADL's : Needs assistance/impaired     Grooming: Set up;Sitting   Upper Body Bathing: Set up;Sitting   Lower Body Bathing: Moderate assistance;Sit to/from stand   Upper Body Dressing : Minimal assistance;Sitting (iv)   Lower Body  Dressing: Maximal assistance;Sit to/from stand   Toilet Transfer: Min guard;Stand-pivot (to recliner)   Toileting- Clothing Manipulation and Hygiene: Maximal assistance;Sit to/from stand         General ADL Comments: husband present:  said he isn't feeling that good today.  He states he helps pt when he can and she does the best she can. She usually wears depends; she was incontinent x urine in the bed.  Assisted to chair to change sheets and help clean her up     Vision     Perception     Praxis      Pertinent Vitals/Pain Pain Assessment: No/denies pain     Hand Dominance     Extremity/Trunk Assessment Upper Extremity Assessment Upper Extremity Assessment: Generalized weakness           Communication Communication Communication: No difficulties   Cognition Arousal/Alertness: Awake/alert Behavior During Therapy: Flat affect Overall Cognitive Status: Impaired/Different from baseline Area of Impairment: Memory;Safety/judgement     Memory: Decreased short-term memory         General Comments: pt had difficulty with PLOF questions.  Pt was incontinent x urine and did not call for assistance.     General Comments       Exercises       Shoulder Instructions  Home Living Family/patient expects to be discharged to:: Private residence Living Arrangements: Spouse/significant other                     Bathroom Toilet: Standard         Additional Comments: pt states she used to have a 3:1 commode but no longer does      Prior Functioning/Environment Level of Independence: Needs assistance    ADL's / Homemaking Assistance Needed: spouse states she requires assist for ADLs sometimes, "does what she can"  Per husband, son brings food in        OT Diagnosis: Generalized weakness   OT Problem List: Decreased strength;Decreased activity tolerance;Impaired balance (sitting and/or standing);Decreased cognition;Decreased safety  awareness;Decreased knowledge of use of DME or AE;Pain   OT Treatment/Interventions: Self-care/ADL training;DME and/or AE instruction;Therapeutic activities;Patient/family education;Balance training;Cognitive remediation/compensation    OT Goals(Current goals can be found in the care plan section) Acute Rehab OT Goals Patient Stated Goal: none stated; agreeable to working with OT OT Goal Formulation: With patient Time For Goal Achievement: 08/08/15 Potential to Achieve Goals: Good ADL Goals Pt Will Perform Lower Body Bathing: with min guard assist;with adaptive equipment;sit to/from stand Pt Will Perform Lower Body Dressing: with adaptive equipment;sit to/from stand;with min assist Pt Will Transfer to Toilet: with min guard assist;ambulating;bedside commode Pt Will Perform Toileting - Clothing Manipulation and hygiene: with min guard assist;sit to/from stand;sitting/lateral leans Additional ADL Goal #1: husband will assist pt with socks/shoes and guard balance during adls  OT Frequency: Min 2X/week   Barriers to D/C:            Co-evaluation              End of Session Nurse Communication:  (chair alarm in bed)  Activity Tolerance: Patient limited by fatigue Patient left: in bed;with call bell/phone within reach (chair alarm in bed; could not reset bed alarm)   Time: 1330-1401 OT Time Calculation (min): 31 min Charges:  OT General Charges $OT Visit: 1 Procedure OT Evaluation $Initial OT Evaluation Tier I: 1 Procedure OT Treatments $Self Care/Home Management : 8-22 mins G-Codes:    Kristiana Jacko 08/28/15, 3:08 PM  Lesle Chris, OTR/L [phone] 08-28-15

## 2015-08-01 NOTE — Care Management (Signed)
Transitional Care Clinic at [place]:  This Case Manager spoke with Velva Harman, RN CM regarding patient.  Patient known to [employer] and [employer]. PCP: Chari Manning, NP.  Meets criteria for the Transitional Care Clinic at [place]. Attempted to meet with patient x 2 to inform of the Transitional Care Clinic and discuss the medical management provided at the clinic; however, patient not in room.  A Transitional Care Case Manager will attempt to follow-up with patient at a later time.

## 2015-08-01 NOTE — Care Management Important Message (Signed)
Important Message  Patient Details  Name: Ashlee Mack MRN: [id number] Date of Birth: [birth year]   Medicare Important Message Given:  Yes    Shelda Altes 08/01/2015, 12:55 PM

## 2015-08-02 ENCOUNTER — Encounter (HOSPITAL_COMMUNITY): Payer: Self-pay | Admitting: Internal Medicine

## 2015-08-02 ENCOUNTER — Encounter (HOSPITAL_COMMUNITY): Payer: Self-pay | Admitting: Emergency Medicine

## 2015-08-02 ENCOUNTER — Observation Stay (HOSPITAL_COMMUNITY)
Admission: EM | Admit: 2015-08-02 | Discharge: 2015-08-06 | Disposition: A | Discharge status: Home / Self Care | Payer: Medicare Other | Attending: Internal Medicine | Admitting: Internal Medicine

## 2015-08-02 DIAGNOSIS — N183 Chronic kidney disease, stage 3 unspecified: Secondary | ICD-10-CM | POA: Diagnosis present

## 2015-08-02 DIAGNOSIS — D509 Iron deficiency anemia, unspecified: Secondary | ICD-10-CM | POA: Insufficient documentation

## 2015-08-02 DIAGNOSIS — N189 Chronic kidney disease, unspecified: Secondary | ICD-10-CM | POA: Diagnosis present

## 2015-08-02 DIAGNOSIS — N390 Urinary tract infection, site not specified: Secondary | ICD-10-CM | POA: Insufficient documentation

## 2015-08-02 DIAGNOSIS — I872 Venous insufficiency (chronic) (peripheral): Secondary | ICD-10-CM | POA: Diagnosis not present

## 2015-08-02 DIAGNOSIS — I8312 Varicose veins of left lower extremity with inflammation: Secondary | ICD-10-CM

## 2015-08-02 DIAGNOSIS — Z7982 Long term (current) use of aspirin: Secondary | ICD-10-CM | POA: Insufficient documentation

## 2015-08-02 DIAGNOSIS — I351 Nonrheumatic aortic (valve) insufficiency: Secondary | ICD-10-CM | POA: Insufficient documentation

## 2015-08-02 DIAGNOSIS — N179 Acute kidney failure, unspecified: Secondary | ICD-10-CM

## 2015-08-02 DIAGNOSIS — I2510 Atherosclerotic heart disease of native coronary artery without angina pectoris: Secondary | ICD-10-CM

## 2015-08-02 DIAGNOSIS — E43 Unspecified severe protein-calorie malnutrition: Secondary | ICD-10-CM

## 2015-08-02 DIAGNOSIS — I129 Hypertensive chronic kidney disease with stage 1 through stage 4 chronic kidney disease, or unspecified chronic kidney disease: Secondary | ICD-10-CM | POA: Insufficient documentation

## 2015-08-02 DIAGNOSIS — I441 Atrioventricular block, second degree: Secondary | ICD-10-CM | POA: Insufficient documentation

## 2015-08-02 DIAGNOSIS — R55 Syncope and collapse: Principal | ICD-10-CM

## 2015-08-02 DIAGNOSIS — R531 Weakness: Secondary | ICD-10-CM

## 2015-08-02 DIAGNOSIS — Z955 Presence of coronary angioplasty implant and graft: Secondary | ICD-10-CM | POA: Insufficient documentation

## 2015-08-02 DIAGNOSIS — I8311 Varicose veins of right lower extremity with inflammation: Secondary | ICD-10-CM

## 2015-08-02 DIAGNOSIS — Z79899 Other long term (current) drug therapy: Secondary | ICD-10-CM | POA: Insufficient documentation

## 2015-08-02 DIAGNOSIS — I10 Essential (primary) hypertension: Secondary | ICD-10-CM

## 2015-08-02 DIAGNOSIS — Z681 Body mass index (BMI) 19 or less, adult: Secondary | ICD-10-CM | POA: Insufficient documentation

## 2015-08-02 DIAGNOSIS — K5900 Constipation, unspecified: Secondary | ICD-10-CM | POA: Insufficient documentation

## 2015-08-02 DIAGNOSIS — R001 Bradycardia, unspecified: Secondary | ICD-10-CM | POA: Diagnosis not present

## 2015-08-02 DIAGNOSIS — Z8673 Personal history of transient ischemic attack (TIA), and cerebral infarction without residual deficits: Secondary | ICD-10-CM | POA: Insufficient documentation

## 2015-08-02 DIAGNOSIS — I491 Atrial premature depolarization: Secondary | ICD-10-CM | POA: Insufficient documentation

## 2015-08-02 DIAGNOSIS — Z66 Do not resuscitate: Secondary | ICD-10-CM | POA: Insufficient documentation

## 2015-08-02 DIAGNOSIS — E785 Hyperlipidemia, unspecified: Secondary | ICD-10-CM | POA: Insufficient documentation

## 2015-08-02 DIAGNOSIS — I951 Orthostatic hypotension: Secondary | ICD-10-CM | POA: Diagnosis present

## 2015-08-02 DIAGNOSIS — E876 Hypokalemia: Secondary | ICD-10-CM

## 2015-08-02 HISTORY — DX: Hyperlipidemia, unspecified: E78.5

## 2015-08-02 LAB — BASIC METABOLIC PANEL WITH GFR
Anion gap: 12 (ref 5–15)
Anion gap: 12 (ref 5–15)
BUN: 25 mg/dL — ABNORMAL HIGH (ref 6–20)
BUN: 27 mg/dL — ABNORMAL HIGH (ref 6–20)
CO2: 27 mmol/L (ref 22–32)
CO2: 26 mmol/L (ref 22–32)
Calcium: 9 mg/dL (ref 8.9–10.3)
Calcium: 9.3 mg/dL (ref 8.9–10.3)
Chloride: 101 mmol/L (ref 101–111)
Chloride: 99 mmol/L — ABNORMAL LOW (ref 101–111)
Creatinine, Ser: 1.3 mg/dL — ABNORMAL HIGH (ref 0.44–1.00)
Creatinine, Ser: 1.26 mg/dL — ABNORMAL HIGH (ref 0.44–1.00)
GFR calc Af Amer: 46 mL/min — ABNORMAL LOW (ref >60)
GFR calc Af Amer: 48 mL/min — ABNORMAL LOW (ref >60)
GFR calc non Af Amer: 40 mL/min — ABNORMAL LOW (ref >60)
GFR calc non Af Amer: 41 mL/min — ABNORMAL LOW (ref >60)
Glucose, Bld: 103 mg/dL — ABNORMAL HIGH (ref 65–99)
Glucose, Bld: 129 mg/dL — ABNORMAL HIGH (ref 65–99)
Potassium: 3.6 mmol/L (ref 3.5–5.1)
Potassium: 3.2 mmol/L — ABNORMAL LOW (ref 3.5–5.1)
Sodium: 140 mmol/L (ref 135–145)
Sodium: 137 mmol/L (ref 135–145)

## 2015-08-02 LAB — CBG MONITORING, ED: Glucose-Capillary: 132 mg/dL — ABNORMAL HIGH (ref 65–99)

## 2015-08-02 LAB — CBC
HEMATOCRIT: 27.6 % — AB (ref 36.0–46.0)
Hemoglobin: 8.8 g/dL — ABNORMAL LOW (ref 12.0–15.0)
MCH: 23.5 pg — AB (ref 26.0–34.0)
MCHC: 31.9 g/dL (ref 30.0–36.0)
MCV: 73.6 fL — AB (ref 78.0–100.0)
PLATELETS: 695 10*3/uL — AB (ref 150–400)
RBC: 3.75 MIL/uL — AB (ref 3.87–5.11)
RDW: 27.3 % — ABNORMAL HIGH (ref 11.5–15.5)
WBC: 11.2 10*3/uL — ABNORMAL HIGH (ref 4.0–10.5)

## 2015-08-02 LAB — URINALYSIS, ROUTINE W REFLEX MICROSCOPIC
Bilirubin Urine: NEGATIVE
GLUCOSE, UA: NEGATIVE mg/dL
HGB URINE DIPSTICK: NEGATIVE
Ketones, ur: NEGATIVE mg/dL
LEUKOCYTES UA: NEGATIVE
Nitrite: NEGATIVE
PH: 7 (ref 5.0–8.0)
PROTEIN: NEGATIVE mg/dL
SPECIFIC GRAVITY, URINE: 1.011 (ref 1.005–1.030)

## 2015-08-02 LAB — I-STAT TROPONIN, ED: Troponin i, poc: 0.01 ng/mL (ref 0.00–0.08)

## 2015-08-02 MED ORDER — ASPIRIN EC 81 MG PO TBEC
81.0000 mg | DELAYED_RELEASE_TABLET | Freq: Every day | ORAL | Status: DC
  Administered 2015-08-03 – 2015-08-06 (×4): 81 mg via ORAL
  Filled 2015-08-02 (×4): qty 1

## 2015-08-02 MED ORDER — SODIUM CHLORIDE 0.9 % IV SOLN
INTRAVENOUS | Status: AC
Start: 2015-08-03 — End: 2015-08-03
  Administered 2015-08-03: via INTRAVENOUS

## 2015-08-02 MED ORDER — ONDANSETRON HCL 4 MG/2ML IJ SOLN: 4.0000 mg | Freq: Four times a day (QID) | INTRAMUSCULAR | Status: DC | PRN

## 2015-08-02 MED ORDER — CARVEDILOL 12.5 MG PO TABS
12.5000 mg | ORAL_TABLET | Freq: Two times a day (BID) | ORAL | Status: DC
  Filled 2015-08-02: qty 1

## 2015-08-02 MED ORDER — ENSURE ENLIVE PO LIQD
237.0000 mL | Freq: Two times a day (BID) | ORAL | Status: DC
  Administered 2015-08-03 – 2015-08-06 (×7): 237 mL via ORAL

## 2015-08-02 MED ORDER — SENNOSIDES-DOCUSATE SODIUM 8.6-50 MG PO TABS
1.0000 | ORAL_TABLET | Freq: Every day | ORAL | Status: DC
  Administered 2015-08-03 – 2015-08-05 (×3): 1 via ORAL
  Filled 2015-08-02 (×3): qty 1

## 2015-08-02 MED ORDER — CEFUROXIME AXETIL 250 MG PO TABS: 250.0000 mg | ORAL_TABLET | Freq: Two times a day (BID) | ORAL | Status: AC

## 2015-08-02 MED ORDER — SODIUM CHLORIDE 0.9 % IJ SOLN
3.0000 mL | Freq: Two times a day (BID) | INTRAMUSCULAR | Status: DC
  Administered 2015-08-04 – 2015-08-06 (×5): 3 mL via INTRAVENOUS

## 2015-08-02 MED ORDER — HYDROMORPHONE HCL 1 MG/ML IJ SOLN: 0.5000 mg | INTRAMUSCULAR | Status: DC | PRN

## 2015-08-02 MED ORDER — CEFUROXIME AXETIL 250 MG PO TABS
250.0000 mg | ORAL_TABLET | Freq: Two times a day (BID) | ORAL | Status: DC
  Administered 2015-08-03 – 2015-08-06 (×7): 250 mg via ORAL
  Filled 2015-08-02 (×10): qty 1

## 2015-08-02 MED ORDER — POLYETHYLENE GLYCOL 3350 17 G PO PACK: 17.0000 g | PACK | Freq: Two times a day (BID) | ORAL | Status: DC

## 2015-08-02 MED ORDER — AMLODIPINE BESYLATE 10 MG PO TABS
10.0000 mg | ORAL_TABLET | Freq: Every day | ORAL | Status: DC
  Filled 2015-08-02: qty 1

## 2015-08-02 MED ORDER — ENOXAPARIN SODIUM 30 MG/0.3ML ~~LOC~~ SOLN
30.0000 mg | SUBCUTANEOUS | Status: DC
  Administered 2015-08-03 – 2015-08-06 (×4): 30 mg via SUBCUTANEOUS
  Filled 2015-08-02 (×5): qty 0.3

## 2015-08-02 MED ORDER — OXYCODONE HCL 5 MG PO TABS
5.0000 mg | ORAL_TABLET | ORAL | Status: DC | PRN
  Filled 2015-08-02: qty 1

## 2015-08-02 MED ORDER — ACETAMINOPHEN 325 MG PO TABS: 650.0000 mg | ORAL_TABLET | Freq: Four times a day (QID) | ORAL | Status: DC | PRN

## 2015-08-02 MED ORDER — ALUM & MAG HYDROXIDE-SIMETH 200-200-20 MG/5ML PO SUSP: 30.0000 mL | Freq: Four times a day (QID) | ORAL | Status: DC | PRN

## 2015-08-02 MED ORDER — ACETAMINOPHEN 650 MG RE SUPP
650.0000 mg | Freq: Four times a day (QID) | RECTAL | Status: DC | PRN
Start: 2015-08-02 — End: 2015-08-06

## 2015-08-02 MED ORDER — CEFUROXIME AXETIL 250 MG PO TABS
250.0000 mg | ORAL_TABLET | Freq: Two times a day (BID) | ORAL | Status: DC
  Administered 2015-08-02: 250 mg via ORAL
  Filled 2015-08-02 (×3): qty 1

## 2015-08-02 MED ORDER — POTASSIUM CHLORIDE CRYS ER 20 MEQ PO TBCR
20.0000 meq | EXTENDED_RELEASE_TABLET | Freq: Once | ORAL | Status: AC
  Administered 2015-08-02: 20 meq via ORAL
  Filled 2015-08-02: qty 1

## 2015-08-02 MED ORDER — CEFUROXIME AXETIL 500 MG PO TABS: 500.0000 mg | ORAL_TABLET | Freq: Two times a day (BID) | ORAL | Status: DC

## 2015-08-02 MED ORDER — SENNOSIDES-DOCUSATE SODIUM 8.6-50 MG PO TABS: 1.0000 | ORAL_TABLET | Freq: Every day | ORAL | Status: DC

## 2015-08-02 MED ORDER — ATORVASTATIN CALCIUM 10 MG PO TABS
10.0000 mg | ORAL_TABLET | Freq: Every day | ORAL | Status: DC
  Administered 2015-08-03 – 2015-08-05 (×3): 10 mg via ORAL
  Filled 2015-08-02 (×3): qty 1

## 2015-08-02 MED ORDER — FERROUS SULFATE 325 (65 FE) MG PO TABS
325.0000 mg | ORAL_TABLET | Freq: Every day | ORAL | Status: DC
  Administered 2015-08-03 – 2015-08-06 (×4): 325 mg via ORAL
  Filled 2015-08-02 (×4): qty 1

## 2015-08-02 MED ORDER — ONDANSETRON HCL 4 MG PO TABS: 4.0000 mg | ORAL_TABLET | Freq: Four times a day (QID) | ORAL | Status: DC | PRN

## 2015-08-02 NOTE — ED Notes (Signed)
Bed: [id number] Expected date:  Expected time:  Means of arrival:  Comments: 74 yo F  Syncope

## 2015-08-02 NOTE — Clinical Social Work Note (Signed)
Clinical Social Work Assessment  Patient Details  Name: Ashlee Mack MRN: [id number] Date of Birth: 1941/05/10  Date of referral:  08/02/15               Reason for consult:  Facility Placement                Permission sought to share information with:  Family Supports Permission granted to share information::  Yes, Verbal Permission Granted  Name::        Agency::     Relationship::     Contact Information:     Housing/Transportation Living arrangements for the past 2 months:  Single Family Home Source of Information:  Adult Children (son braggi) Patient Interpreter Needed:    Criminal Activity/Legal Involvement Pertinent to Current Situation/Hospitalization:    Significant Relationships:  Adult Children, Spouse Lives with:  Adult Children, Spouse Do you feel safe going back to the place where you live?    Need for family participation in patient care:  Yes (Comment)  Care giving concerns:  Pt's son requested some information on assisted living facilities   Social Worker assessment / plan:  CSW met with pt, her husband and son at bedside.  CSW provided information regarding ALF facilities and answered any questions family had.  CSW provided resources that family may use after pt discharges.  Employment status:  Retired Forensic scientist:  Medicare PT Recommendations:  Not assessed at this time Information / Referral to community resources:     Patient/Family's Response to care:  Pt's son stated that pt will have a 30 day in home nurse at discharge and would not be able to pay for any ALF.  Pt's son stated that he would look into the in home services after the 30 day nurse ends.  Patient/Family's Understanding of and Emotional Response to Diagnosis, Current Treatment, and Prognosis:  Pt's son understands pt's needs and thankful for in home support.  Pt's son will follow up with in home services stating only a need for a couple of hours a day after in home nursing  ends.  Emotional Assessment Appearance:  Appears stated age Attitude/Demeanor/Rapport:  Apprehensive Affect (typically observed):  Apprehensive Orientation:  Oriented to Self, Oriented to Place, Oriented to  Time, Oriented to Situation Alcohol / Substance use:    Psych involvement (Current and /or in the community):     Discharge Needs  Concerns to be addressed:    Readmission within the last 30 days:    Current discharge risk:    Barriers to Discharge:  No Barriers Identified   Carlean Jews, LCSW 08/02/2015, 2:02 PM

## 2015-08-02 NOTE — Discharge Summary (Signed)
Physician Discharge Summary  Ashlee Mack [id number] DOB: Apr 15, 1941 DOA: 07/30/2015  PCP: Lance Bosch, NP  Admit date: 07/30/2015 Discharge date: 08/02/2015  Time spent: 65 minutes  Recommendations for Outpatient Follow-up:  1. Follow-up with Lance Bosch, NP in 1-2 weeks. Follow-up patient in need a basic metabolic profile done to follow-up on electrolytes and renal function.   Discharge Diagnoses:  Principal Problem:   Generalized weakness Active Problems:   UTI (lower urinary tract infection)   Essential hypertension   Constipation   CKD (chronic kidney disease) stage 3, GFR 30-59 ml/min   Chronic anemia   Fecal impaction (HCC)   Lower urinary tract infection   Discharge Condition: Stable and improved  Diet recommendation: Regular/soft  Filed Weights   07/30/15 2240 07/31/15 0544  Weight: 49.896 kg (110 lb) 46.176 kg (101 lb 12.8 oz)    History of present illness:  Per Dr Nickie Retort is a 74 y.o. female with history of hypertension, hyperlipidemia, chronic kidney disease and anemia presented to the ER because of generalized weakness and poor appetite over the last 1 week. As per the patient patient had been having poor appetite with some epigastric discomfort over the last 1 week. Denied any nausea vomiting or diarrhea. In the ER patient was found to have urine compatible with UTI. Labs also revealed mildly elevated lipase. Since patient was feeling weak at this time patient had been admitted for IV fluids and antibiotics for UTI. CT abdomen and pelvis showed cholelithiasis with no evidence of cholecystitis. In addition to also showed constipation with fecal impaction. Patient otherwise denied any chest pain or shortness of breath. Patient denied losing consciousness or any focal deficits.   Hospital Course:  #1 generalized weakness Likely secondary to poor oral intake and urinary tract infection. Patient was admitted placed empirically on IV Rocephin  and urine cultures obtained. Urine cultures were obtained after IV antibiotics were started. Urine cultures came back negative. Patient improved clinically. Patient will be discharged home on 5 more days of oral Ceftin to complete a one-week course of antibiotic therapy. Patient was seen in consultation by physical therapy recommended home health PT/OT. Home health was arranged prior to discharge.   #2 urinary tract infection Urinalysis was consistent with UTI. Patient was placed on IV Rocephin. Urine cultures negative. Urine cultures were obtained after IV antibiotics were started. Urinalysis had moderate leukocytes negative nitrite too numerous to count WBCs. Patient with significant clinical improvement. IV Rocephin changed to oral ceftin. Patient will be discharged home on 5 more days of oral Ceftin to complete a one-week course of antibiotic therapy.   #3 constipation/fecal impaction Patient was given soapsuds enema with no results. Patient was given a SMOG enema with significant results. Patient was placed on MiraLAX twice daily and Senokot-S at bedtime for bowel regimen. Patient will be discharged on bowel regimen.  #4 chronic anemia Stable.  #5 hypertension BP remained stable. Patient was maintained on coreg, norvasc.   #6 hyperlipidemia Patient was placed on statin.  #7 gaseous distention of the endometrial cavity of unknown significance Outpatient follow-up.  #8 history of coronary artery disease Stable. Patient was maintained on aspirin and statin.  #9 chronic kidney disease stage III Remained stable throughout the hospitalization. Outpatient follow-up.  #10.Poor appetite cholelithiasis and epigastric discomfort/? Pancreatitis Patient with clinical improvement. CT scan with no definite signs of pancreatitis. Lipase levels trended down. HIDA scan with no cystic duct obstruction. No CBD obstruction. Continued supportive care. Follow.   Procedures:  HIDA scan 08/01/2015  CT  abd/pelvis 07/30/2015  CXR 07/31/2015    Consultations:  None  Discharge Exam: Filed Vitals:   08/02/15 0538  BP: 117/62  Pulse: 55  Temp: 97.8 F (36.6 C)  Resp: 16    General: NAD Cardiovascular: RRR Respiratory: CTAB  Discharge Instructions   Discharge Instructions    Diet general    Complete by:  As directed   Soft diet     Discharge instructions    Complete by:  As directed   Follow up with Lance Bosch, NP in 1-2 weeks.     Increase activity slowly    Complete by:  As directed           Current Discharge Medication List    START taking these medications   Details  cefUROXime (CEFTIN) 250 MG tablet Take 1 tablet (250 mg total) by mouth 2 (two) times daily with a meal. Take for 5 days then stop. Qty: 10 tablet, Refills: 0    polyethylene glycol (MIRALAX / GLYCOLAX) packet Take 17 g by mouth 2 (two) times daily. Hold if develops diarrhea. Qty: 14 each, Refills: 0    senna-docusate (SENOKOT-S) 8.6-50 MG tablet Take 1 tablet by mouth at bedtime.      CONTINUE these medications which have NOT CHANGED   Details  amLODipine (NORVASC) 10 MG tablet Take 1 tablet (10 mg total) by mouth daily. Qty: 30 tablet, Refills: 5   Associated Diagnoses: Essential hypertension    aspirin EC 81 MG tablet Take 1 tablet (81 mg total) by mouth daily. Qty: 30 tablet, Refills: 5   Associated Diagnoses: Coronary artery disease due to lipid rich plaque    atorvastatin (LIPITOR) 10 MG tablet Take 1 tablet (10 mg total) by mouth daily. For cholesterol Qty: 30 tablet, Refills: 0   Associated Diagnoses: HLD (hyperlipidemia)    carvedilol (COREG) 12.5 MG tablet Take 12.5 mg by mouth 2 (two) times daily with a meal.    feeding supplement, ENSURE COMPLETE, (ENSURE COMPLETE) LIQD Take 237 mLs by mouth 2 (two) times daily between meals.    ferrous sulfate 325 (65 FE) MG tablet Take 1 tablet (325 mg total) by mouth daily with breakfast. Qty: 30 tablet, Refills: 3   Associated  Diagnoses: Anemia, iron deficiency      STOP taking these medications     chlorthalidone (HYGROTON) 25 MG tablet      polyethylene glycol powder (GLYCOLAX/MIRALAX) powder      ciprofloxacin (CIPRO) 250 MG tablet        No Known Allergies Follow-up Information    Follow up with [facility].   Why:  Home Health Physical therapy, Occupational Therapy, Registered Nurse, Social Work and [place] information:   [address] [place] [address] [phone]       Follow up with Lance Bosch, NP. Schedule an appointment as soon as possible for a visit in 2 weeks.   Specialty:  Internal Medicine   Why:  f/u in 1-2 weeks.   Contact information:   [place] [address] [phone]        The results of significant diagnostics from this hospitalization (including imaging, microbiology, ancillary and laboratory) are listed below for reference.    Significant Diagnostic Studies: Nm Hepatobiliary Liver Func  08/01/2015  CLINICAL DATA:  Generalized weakness, epigastric pain for 1 week EXAM: NUCLEAR MEDICINE HEPATOBILIARY IMAGING TECHNIQUE: Sequential images of the abdomen were  obtained out to 60 minutes following intravenous administration of radiopharmaceutical. RADIOPHARMACEUTICALS:  5.5 mCi Tc-4m  Choletec IV COMPARISON:  CT scan 07/30/2015 FINDINGS: There is normal uptake of the tracer by the liver. Gallbladder visualized at 35 minutes. CBD visualized at 41 minutes. Bowel activity noted at 60 minutes. IMPRESSION: 1. No cystic duct obstruction.  No CBD obstruction. Electronically Signed   By: Lahoma Crocker M.D.   On: 08/01/2015 12:04   Ct Abdomen Pelvis W Contrast  07/30/2015  CLINICAL DATA:  Lower abdominal pain, worsening weakness. EXAM: CT ABDOMEN AND PELVIS WITH CONTRAST TECHNIQUE: Multidetector CT imaging of the abdomen and pelvis was performed using the standard protocol following bolus administration of intravenous  contrast. CONTRAST:  22mL OMNIPAQUE IOHEXOL 300 MG/ML SOLN, 36mL OMNIPAQUE IOHEXOL 300 MG/ML SOLN COMPARISON:  02/09/2015 unenhanced CT abdomen/pelvis. FINDINGS: Lower chest: No significant pulmonary nodules or acute consolidative airspace disease. Left anterior descending coronary atherosclerosis. Hepatobiliary: Simple 0.8 cm right liver lobe cyst. At least 4 additional subcentimeter hypodense liver lesions, too small to characterize. There is a 1.0 cm peripherally calcified gallstone layering in the nondistended gallbladder, with no gallbladder wall thickening or pericholecystic fat stranding. Stable mild diffuse intrahepatic biliary ductal dilatation. Stable mildly dilated common bile duct (7 mm diameter), with smooth distal tapering and no radiopaque choledocholithiasis. Pancreas: Normal, with no mass or duct dilation. Spleen: Normal size. No mass. Adrenals/Urinary Tract: Normal adrenals. There are small simple bilateral renal cysts, largest 1.1 cm in the lower right kidney and 1.6 cm in the lower left kidney. There are innumerable additional subcentimeter hypodense renal lesions throughout both kidneys, too small to characterize. There is stable mild fullness of the right renal collecting system without overt right hydronephrosis. There are small extrarenal pelves bilaterally, unchanged. No left hydronephrosis. Borderline mild diffuse bladder wall thickening. No focal bladder abnormality. Stomach/Bowel: Grossly normal stomach. Normal caliber small bowel with no small bowel wall thickening. Appendix is not discretely visualized. There is a large amount of stool throughout the colon, with no colonic wall thickening or colonic diverticulosis. The rectum is prominently distended by stool up to a diameter of 6.6 cm. There is no appreciable rectal wall thickening or pneumatosis. Vascular/Lymphatic: Normal caliber abdominal aorta. Patent portal, splenic, hepatic and renal veins. No pathologically enlarged lymph nodes  in the abdomen or pelvis. Reproductive: The endometrial cavity is distended by gas, which is a chronic finding. Other: No pneumoperitoneum, ascites or focal fluid collection. Musculoskeletal: No aggressive appearing focal osseous lesions. Mild-to-moderate degenerative changes in the visualized thoracolumbar spine. Stable moderate dextroscoliosis of the thoracolumbar spine. IMPRESSION: 1. No evidence of bowel obstruction or acute bowel inflammation. 2. Prominent colorectal stool volume, suggesting constipation, with likely fecal impaction in the rectum. No evidence of stercoral colitis. 3. Borderline mild diffuse bladder wall thickening, recommend correlation with urinalysis to exclude acute cystitis. 4. Cholelithiasis. No evidence of acute cholecystitis. Stable mild chronic biliary ductal dilatation, with no radiopaque choledocholithiasis. Recommend correlation with serum bilirubin levels. 5. Chronic gaseous distention of the endometrial cavity, a finding of uncertain significance. There is no uterine hyperenhancement to suggest acute gynecologic infection. There is no evidence of a uterine fistula to the bowel. Electronically Signed   By: Ilona Sorrel M.D.   On: 07/30/2015 19:21   Dg Chest Port 1 View  07/31/2015  CLINICAL DATA:  Epigastric pain, weakness, diarrhea, hypertension, coronary artery disease EXAM: PORTABLE CHEST 1 VIEW COMPARISON:  Portable exam 0639 hours compared to 02/09/2015 FINDINGS: Upper normal heart size. Rotated to the LEFT.  Mediastinal contours and pulmonary vascularity normal. Atherosclerotic calcification aorta. Lungs clear. Eventration RIGHT diaphragm again noted. No pleural effusion or pneumothorax. Bones demineralized. IMPRESSION: No acute abnormalities. Electronically Signed   By: Lavonia Dana M.D.   On: 07/31/2015 07:50    Microbiology: Recent Results (from the past 240 hour(s))  Culture, Urine     Status: None   Collection Time: 07/31/15 10:55 AM  Result Value Ref Range  Status   Specimen Description URINE, CLEAN CATCH  Final   Special Requests NONE  Final   Culture   Final    NO GROWTH 1 DAY Performed at [facility]    Report Status 08/01/2015 FINAL  Final     Labs: Basic Metabolic Panel:  Recent Labs Lab 07/30/15 1533 07/31/15 0115 07/31/15 0415 08/01/15 0509 08/02/15 0533  NA 139 136  --  140 140  K 3.0* 3.0*  --  3.5 3.6  CL 99* 97*  --  101 101  CO2 28 28  --  29 27  GLUCOSE 119* 210*  --  95 103*  BUN 34* 28*  --  26* 25*  CREATININE 1.42* 1.36*  --  1.28* 1.30*  CALCIUM 9.6 8.6*  --  8.9 9.0  MG  --   --  2.4  --   --    Liver Function Tests:  Recent Labs Lab 07/30/15 1533 07/31/15 0115  AST 48* 42*  ALT 37 32  ALKPHOS 61 54  BILITOT 0.7 0.3  PROT 9.0* 7.9  ALBUMIN 4.2 3.6    Recent Labs Lab 07/30/15 1533 07/31/15 0415  LIPASE 56* 46   No results for input(s): AMMONIA in the last 168 hours. CBC:  Recent Labs Lab 07/30/15 1533 07/31/15 0115 08/01/15 0509  WBC 9.9 9.8 9.3  HGB 9.2* 8.6* 8.3*  HCT 29.2* 27.1* 26.8*  MCV 73.9* 75.3* 75.3*  PLT 777* 585* 594*   Cardiac Enzymes:  Recent Labs Lab 07/31/15 0115  CKTOTAL 37*  TROPONINI <0.03   BNP: BNP (last 3 results)  Recent Labs  02/09/15 2017  BNP 79.5    ProBNP (last 3 results) No results for input(s): PROBNP in the last 8760 hours.  CBG:  Recent Labs Lab 07/30/15 1604  GLUCAP 113*       Signed:  Derionna Salvador MD [facility] 08/02/2015, 12:32 PM

## 2015-08-02 NOTE — H&P (Signed)
[facility] Admission History and Physical       Ashlee Mack [id number] DOB: [birth year] DOA: 08/02/2015  Referring physician: EDP PCP: Lance Bosch, NP  Specialists:   Chief Complaint: Passed Out  HPI: Ashlee Mack is a 74 y.o. female with a history of CAD, HTN, Stage III CKD who presents to the ED after suffering a syncopal event in her home.   She was witnessed as passing out when she went to the bathroom and she was passed out for about 3 minutes.   She was brought to the ED for further evaluation.  She was discharged in the AM after a hospitalization for UTI.       Review of Systems:  Constitutional: No Weight Loss, No Weight Gain, Night Sweats, Fevers, Chills, Dizziness, Light Headedness, Fatigue, +Generalized Weakness HEENT: No Headaches, Difficulty Swallowing,Tooth/Dental Problems,Sore Throat,  No Sneezing, Rhinitis, Ear Ache, Nasal Congestion, or Post Nasal Drip,  Cardio-vascular:  No Chest pain, Orthopnea, PND, Edema in Lower Extremities, Anasarca, Dizziness, Palpitations  Resp: No Dyspnea, No DOE, No Productive Cough, No Non-Productive Cough, No Hemoptysis, No Wheezing.    GI: No Heartburn, Indigestion, Abdominal Pain, Nausea, Vomiting, Diarrhea, Constipation, Hematemesis, Hematochezia, Melena, Change in Bowel Habits,  Loss of Appetite  GU: No Dysuria, No Change in Color of Urine, No Urgency or Urinary Frequency, No Flank pain.  Musculoskeletal: No Joint Pain or Swelling, No Decreased Range of Motion, No Back Pain.  Neurologic:  +Syncope, No Seizures, Muscle Weakness, Paresthesia, Vision Disturbance or Loss, No Diplopia, No Vertigo, No Difficulty Walking,  Skin: No Rash or Lesions. Psych: No Change in Mood or Affect, No Depression or Anxiety, No Memory loss, No Confusion, or Hallucinations   Past Medical History  Diagnosis Date  . Coronary artery disease   . Hypertension   . CKD (chronic kidney disease) stage 3, GFR 30-59 ml/min 07/31/2015     Past  Surgical History  Procedure Laterality Date  . Coronary angioplasty with stent placement        Prior to Admission medications   Medication Sig Start Date End Date Taking? Authorizing Provider  amLODipine (NORVASC) 10 MG tablet Take 1 tablet (10 mg total) by mouth daily. 05/20/15  Yes Lance Bosch, NP  aspirin EC 81 MG tablet Take 1 tablet (81 mg total) by mouth daily. 12/09/14  Yes Lance Bosch, NP  atorvastatin (LIPITOR) 10 MG tablet Take 1 tablet (10 mg total) by mouth daily. For cholesterol 05/20/15  Yes Lance Bosch, NP  cefUROXime (CEFTIN) 250 MG tablet Take 1 tablet (250 mg total) by mouth 2 (two) times daily with a meal. Take for 5 days then stop. 08/02/15 08/07/15 Yes Eugenie Filler, MD  feeding supplement, ENSURE COMPLETE, (ENSURE COMPLETE) LIQD Take 237 mLs by mouth 2 (two) times daily between meals. 08/29/14  Yes Barton Dubois, MD  ferrous sulfate 325 (65 FE) MG tablet Take 1 tablet (325 mg total) by mouth daily with breakfast. 05/20/15  Yes Lance Bosch, NP  polyethylene glycol (MIRALAX / GLYCOLAX) packet Take 17 g by mouth 2 (two) times daily. Hold if develops diarrhea. 08/02/15  Yes Eugenie Filler, MD  senna-docusate (SENOKOT-S) 8.6-50 MG tablet Take 1 tablet by mouth at bedtime. 08/02/15  Yes Eugenie Filler, MD  carvedilol (COREG) 12.5 MG tablet Take 12.5 mg by mouth 2 (two) times daily with a meal.    Historical Provider, MD     No Known Allergies    Social History:  reports  that she has never smoked. She does not have any smokeless tobacco history on file. She reports that she does not drink alcohol or use illicit drugs.    Family History  Problem Relation Age of Onset  . Hypertension Mother   . Hypertension Father        Physical Exam:  GEN:  Pleasant Elderly Cachectic 74 y.o. African American female examined and in no acute distress; cooperative with exam Filed Vitals:   08/02/15 2030 08/02/15 2130 08/02/15 2200 08/02/15 2230  BP: 118/63 126/64  131/64 121/56  Pulse: 67 71 74 78  Temp: 98.4 F (36.9 C)     TempSrc: Oral     Resp: 11 19 19 16   SpO2: 100% 98% 100% 100%   Blood pressure 121/56, pulse 78, temperature 98.4 F (36.9 C), temperature source Oral, resp. rate 16, SpO2 100 %. PSYCH: She is alert and oriented x4; does not appear anxious does not appear depressed; affect is normal HEENT: Normocephalic and Atraumatic, Mucous membranes pink; PERRLA; EOM intact; Fundi:  Benign;  No scleral icterus, Nares: Patent, Oropharynx: Clear, Edentulous,    Neck:  FROM, No Cervical Lymphadenopathy nor Thyromegaly or Carotid Bruit; No JVD; Breasts:: Not examined CHEST WALL: No tenderness CHEST: Normal respiration, clear to auscultation bilaterally HEART: Regular rate and rhythm; no murmurs rubs or gallops BACK: No kyphosis or scoliosis; No CVA tenderness ABDOMEN: Positive Bowel Sounds, Scaphoid, Soft Non-Tender, No Rebound or Guarding; No Masses, No Organomegaly, No Pannus; No Intertriginous candida. Rectal Exam: Not done EXTREMITIES: No Cyanosis, Clubbing, or Edema; No Ulcerations. Genitalia: not examined PULSES: 2+ and symmetric SKIN: Normal hydration no rash or ulceration CNS:  Alert and Oriented x 4, No Focal Deficits Vascular: pulses palpable throughout    Labs on Admission:  Basic Metabolic Panel:  Recent Labs Lab 07/30/15 1533 07/31/15 0115 07/31/15 0415 08/01/15 0509 08/02/15 0533 08/02/15 2038  NA 139 136  --  140 140 137  K 3.0* 3.0*  --  3.5 3.6 3.2*  CL 99* 97*  --  101 101 99*  CO2 28 28  --  29 27 26   GLUCOSE 119* 210*  --  95 103* 129*  BUN 34* 28*  --  26* 25* 27*  CREATININE 1.42* 1.36*  --  1.28* 1.30* 1.26*  CALCIUM 9.6 8.6*  --  8.9 9.0 9.3  MG  --   --  2.4  --   --   --    Liver Function Tests:  Recent Labs Lab 07/30/15 1533 07/31/15 0115  AST 48* 42*  ALT 37 32  ALKPHOS 61 54  BILITOT 0.7 0.3  PROT 9.0* 7.9  ALBUMIN 4.2 3.6    Recent Labs Lab 07/30/15 1533 07/31/15 0415  LIPASE  56* 46   No results for input(s): AMMONIA in the last 168 hours. CBC:  Recent Labs Lab 07/30/15 1533 07/31/15 0115 08/01/15 0509 08/02/15 2038  WBC 9.9 9.8 9.3 11.2*  HGB 9.2* 8.6* 8.3* 8.8*  HCT 29.2* 27.1* 26.8* 27.6*  MCV 73.9* 75.3* 75.3* 73.6*  PLT 777* 585* 594* 695*   Cardiac Enzymes:  Recent Labs Lab 07/31/15 0115  CKTOTAL 54*  TROPONINI <0.03    BNP (last 3 results)  Recent Labs  02/09/15 2017  BNP 79.5    ProBNP (last 3 results) No results for input(s): PROBNP in the last 8760 hours.  CBG:  Recent Labs Lab 07/30/15 1604 08/02/15 2042  GLUCAP 113* 132*    Radiological Exams on Admission: Nm Hepatobiliary  Liver Func  08/01/2015  CLINICAL DATA:  Generalized weakness, epigastric pain for 1 week EXAM: NUCLEAR MEDICINE HEPATOBILIARY IMAGING TECHNIQUE: Sequential images of the abdomen were obtained out to 60 minutes following intravenous administration of radiopharmaceutical. RADIOPHARMACEUTICALS:  5.5 mCi Tc-20m  Choletec IV COMPARISON:  CT scan 07/30/2015 FINDINGS: There is normal uptake of the tracer by the liver. Gallbladder visualized at 35 minutes. CBD visualized at 41 minutes. Bowel activity noted at 60 minutes. IMPRESSION: 1. No cystic duct obstruction.  No CBD obstruction. Electronically Signed   By: Lahoma Crocker M.D.   On: 08/01/2015 12:04     EKG: Independently reviewed. Normal Sinus Rhythm Rate =68, diffuse Artifact, Old Anterior Infarct changes     Assessment/Plan:   74 y.o. female with  Principal Problem:   1.       Syncope   Cardiac Monitoring   Check Orthostatic Vitals       Active Problems:   2.     Uncontrolled hypertension   IV Hydralazine PRN   Continue Carvedilol, Amlodipine and     3.     AKI    Gentle IVFs   Monitor BUN/Cr     4.     Chronic venous stasis dermatitis of both lower extremities   Chronic     5.     Coronary artery disease   Cardiac Monitoring   Continue Carvedilol, Atorvastatin, and ASA Rx      6.     CKD (chronic kidney disease) stage 3, GFR 30-59 ml/min   Monitor  BUN/Cr     7.     Partially Treated UTI   Continue Oral Ceftin Rx   Repeat Urine Culture     8.      Malnutrition   Continue Nutritional supplements      9.    DVT Prophylaxis   Lovenox     Code Status:     DO NOT RESUSCITATE (DNR)      Family Communication:   Husband at Bedside     Disposition Plan:    Observation Status        Time spent:  20 Minutes      Theressa Millard [facility] Pager [phone]   If [place] Please Contact the Day Rounding Team MD for [facility]  If 7PM-7AM, Please Contact Night-Floor Coverage  [email or web address] Password [email or web address] 08/02/2015, 11:00 PM     ADDENDUM:   Patient was seen and examined on 08/02/2015

## 2015-08-02 NOTE — ED Provider Notes (Signed)
CSN: [id number]     Arrival date & time 08/02/15  2019 History   First MD Initiated Contact with Patient 08/02/15 2035     Chief Complaint  Patient presents with  . Loss of Consciousness     (Consider location/radiation/quality/duration/timing/severity/associated sxs/prior Treatment) HPI  Pt presenting with c/o syncope.  Pt was discharged this morning from hospitalization for UTI, generalized weakness.  Husband states she seemed to be improving, went home today, ate well, then went to use the bathroom and he and her daughter found her slumped over on the toilet and did not appear to be responsive.  She did not c/o chest pain.  She does not remember anything after using the bathroom.  Currently denies any symptoms but does appear very weak. Per chart review home health was ordered at the time of her discharge.  Husband states he had not had time to get her discharge prescriptions filled from hospitalization.    Past Medical History  Diagnosis Date  . Coronary artery disease     Patient denies  . Hypertension   . CKD (chronic kidney disease) stage 3, GFR 30-59 ml/min 07/31/2015  . Hyperlipidemia    Past Surgical History  Procedure Laterality Date  . Coronary angioplasty with stent placement     Family History  Problem Relation Age of Onset  . Hypertension Mother   . Hypertension Father    Social History  Substance Use Topics  . Smoking status: Never Smoker   . Smokeless tobacco: None  . Alcohol Use: No   OB History    No data available     Review of Systems  ROS reviewed and all otherwise negative except for mentioned in HPI    Allergies  Review of patient's allergies indicates no known allergies.  Home Medications   Prior to Admission medications   Medication Sig Start Date End Date Taking? Authorizing Provider  amLODipine (NORVASC) 10 MG tablet Take 1 tablet (10 mg total) by mouth daily. 05/20/15  Yes Lance Bosch, NP  aspirin EC 81 MG tablet Take 1 tablet (81 mg  total) by mouth daily. 12/09/14  Yes Lance Bosch, NP  atorvastatin (LIPITOR) 10 MG tablet Take 1 tablet (10 mg total) by mouth daily. For cholesterol 05/20/15  Yes Lance Bosch, NP  cefUROXime (CEFTIN) 250 MG tablet Take 1 tablet (250 mg total) by mouth 2 (two) times daily with a meal. Take for 5 days then stop. 08/02/15 08/07/15 Yes Eugenie Filler, MD  feeding supplement, ENSURE COMPLETE, (ENSURE COMPLETE) LIQD Take 237 mLs by mouth 2 (two) times daily between meals. 08/29/14  Yes Barton Dubois, MD  ferrous sulfate 325 (65 FE) MG tablet Take 1 tablet (325 mg total) by mouth daily with breakfast. 05/20/15  Yes Lance Bosch, NP  polyethylene glycol (MIRALAX / GLYCOLAX) packet Take 17 g by mouth 2 (two) times daily. Hold if develops diarrhea. 08/02/15  Yes Eugenie Filler, MD  senna-docusate (SENOKOT-S) 8.6-50 MG tablet Take 1 tablet by mouth at bedtime. 08/02/15  Yes Eugenie Filler, MD  carvedilol (COREG) 12.5 MG tablet Take 12.5 mg by mouth 2 (two) times daily with a meal.    Historical Provider, MD   BP 135/61 mmHg  Pulse 63  Temp(Src) 98.7 F (37.1 C) (Oral)  Resp 18  Ht 5\' 8"  (1.727 m)  Wt 102 lb 8 oz (46.494 kg)  BMI 15.59 kg/m2  SpO2 100%  Vitals reviewed Physical Exam  Physical Examination: General appearance -  alert, chronically ill and tired appearing, and in no distress Mental status - alert, oriented to person, place, and time Eyes - pupils equal and reactive, extraocular eye movements intact Mouth - mucous membranes moist, pharynx normal without lesions Chest - clear to auscultation, no wheezes, rales or rhonchi, symmetric air entry Heart - normal rate, regular rhythm, normal S1, S2, no murmurs, rubs, clicks or gallops Abdomen - soft, nontender, nondistended, no masses or organomegaly Neurological - alert, oriented, normal speech, no focal findings Extremities - peripheral pulses normal, no pedal edema, no clubbing or cyanosis Skin - normal coloration and turgor, no  rashes  ED Course  Procedures (including critical care time) Labs Review Labs Reviewed  BASIC METABOLIC PANEL - Abnormal; Notable for the following:    Potassium 3.2 (*)    Chloride 99 (*)    Glucose, Bld 129 (*)    BUN 27 (*)    Creatinine, Ser 1.26 (*)    GFR calc non Af Amer 41 (*)    GFR calc Af Amer 48 (*)    All other components within normal limits  CBC - Abnormal; Notable for the following:    WBC 11.2 (*)    RBC 3.75 (*)    Hemoglobin 8.8 (*)    HCT 27.6 (*)    MCV 73.6 (*)    MCH 23.5 (*)    RDW 27.3 (*)    Platelets 695 (*)    All other components within normal limits  URINALYSIS, ROUTINE W REFLEX MICROSCOPIC (NOT AT [facility]) - Abnormal; Notable for the following:    APPearance CLOUDY (*)    All other components within normal limits  BASIC METABOLIC PANEL - Abnormal; Notable for the following:    BUN 22 (*)    Creatinine, Ser 1.17 (*)    GFR calc non Af Amer 45 (*)    GFR calc Af Amer 52 (*)    All other components within normal limits  CBC - Abnormal; Notable for the following:    WBC 12.7 (*)    RBC 3.57 (*)    Hemoglobin 8.4 (*)    HCT 26.7 (*)    MCV 74.8 (*)    MCH 23.5 (*)    RDW 27.5 (*)    Platelets 634 (*)    All other components within normal limits  CBG MONITORING, ED - Abnormal; Notable for the following:    Glucose-Capillary 132 (*)    All other components within normal limits  URINE CULTURE  TSH  TROPONIN I  TROPONIN I  TROPONIN I  CBC  BASIC METABOLIC PANEL  MAGNESIUM  I-STAT TROPOININ, ED    Imaging Review Ct Head Wo Contrast  08/03/2015  CLINICAL DATA:  Syncopal episode at home. History of chronic kidney disease. EXAM: CT HEAD WITHOUT CONTRAST TECHNIQUE: Contiguous axial images were obtained from the base of the skull through the vertex without intravenous contrast. COMPARISON:  03/24/2014 FINDINGS: No mass effect or midline shift. No evidence of acute intracranial hemorrhage. There are brain parenchymal atrophy and deep white  matter microvascular changes. 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 There is hypoattenuated appearance of the brainstem at the interface of pons with medulla oblongata. No abnormal extra-axial fluid collections. Basal cisterns are preserved. No depressed skull fractures. Visualized paranasal sinuses and mastoid air cells are not opacified. IMPRESSION: Area of hypoattenuation within the brain stem, with which acute infarct cannot be excluded. 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 Brain parenchymal volume loss and microvascular changes. These results were called by telephone at the time of interpretation on 08/03/2015 at 10:39 am to Dr. Irine Seal , who verbally acknowledged these results. Electronically Signed   By: Fidela Salisbury M.D.   On: 08/03/2015 10:41   I have personally reviewed and evaluated these images and lab results as part of my medical decision-making.   EKG Interpretation   Date/Time:  Saturday August 02 2015 20:31:24 EST Ventricular Rate:  68 PR Interval:  179 QRS Duration: 106 QT Interval:  420 QTC Calculation: 447 R Axis:   53 Text Interpretation:  Sinus rhythm Anterior infarct, old No significant  change since last tracing Confirmed by [facility]  MD, [place] [phone]) on  08/02/2015 9:04:05 PM      MDM   Final diagnoses:  Syncope, unspecified syncope type  Generalized weakness    Pt presenting after syncopal event at home shortly after being discharged from hospitalization for UTI.  EKg reassuring in the ED.  Labs also at baseline with anemia and mild renal insufficiency.  D/w [facility] for admission to telemetry bed.    10:53 PM d/w Dr. Arnoldo Morale for admission, pt to go to telemetry bed for obs admission.    Alfonzo Beers, MD 08/03/15 [phone]

## 2015-08-02 NOTE — ED Notes (Signed)
Brought in by EMS from home with c/o syncope.  Per EMS, pt's family reported that pt "passed out for approximately 3 minutes".  Pt reports generalized weakness on EMS' arrival.  Has had recent hospitalization for UTI--- was discharged home this morning.  No other complaints.  Denies pain.

## 2015-08-02 NOTE — Progress Notes (Signed)
Went over d/c instructions with pt and her husband.  Both verbalized understanding.  Left hospital via w/c with personal belongings and hard script.  Left in son's personal vehicle. Virginia Rochester, RN

## 2015-08-02 NOTE — Care Management Note (Addendum)
Case Management Note  Patient Details  Name: Tannia Baragan MRN: [id number] Date of Birth: 10/04/1940  Subjective/Objective:                   weakness  Action/Plan: CM spoke with the patient and her husband at the bedside. He states they live with their son. States he is not sure where they will go when she is discharged. Spouse selected [facility] for home health services. Will await sending referral until patient/family have decided on discharge.   Expected Discharge Date:    unknown           Expected Discharge Plan:  [place] vs SNF  In-House Referral:     Discharge planning Services  CM Consult  Post Acute Care Choice:  Home Health Choice offered to:  Patient, Spouse  DME Arranged:  N/A DME Agency:     HH Arranged:  RN, PT, OT, Nurse's Aide, Social Work [employer] Agency:  [facility]  Status of Service:  Completed, signed off  Medicare Important Message Given:  Yes Date Medicare IM Given:    Medicare IM give by:    Date Additional Medicare IM Given:    Additional Medicare Important Message give by:     If discussed at [place] of Stay Meetings, dates discussed:    Additional Comments:  Apolonio Schneiders, RN 08/02/2015, 11:39 AM

## 2015-08-02 NOTE — Care Management (Signed)
Spoke with [place] CSW. Patient will go home today. Tiffany at [facility] notified of referral for [facility]. Merry Proud at [facility] notified for DME request. Venita Sheffield RN CCM

## 2015-08-02 NOTE — ED Notes (Addendum)
EKG given to EDP,Linker,MD., for review. 

## 2015-08-03 ENCOUNTER — Encounter (HOSPITAL_COMMUNITY): Payer: Self-pay | Admitting: Radiology

## 2015-08-03 ENCOUNTER — Other Ambulatory Visit (HOSPITAL_COMMUNITY): Payer: Medicare Other

## 2015-08-03 ENCOUNTER — Observation Stay (HOSPITAL_COMMUNITY): Payer: Medicare Other

## 2015-08-03 DIAGNOSIS — K5900 Constipation, unspecified: Secondary | ICD-10-CM | POA: Diagnosis not present

## 2015-08-03 DIAGNOSIS — R55 Syncope and collapse: Secondary | ICD-10-CM | POA: Diagnosis not present

## 2015-08-03 DIAGNOSIS — I951 Orthostatic hypotension: Secondary | ICD-10-CM | POA: Diagnosis not present

## 2015-08-03 DIAGNOSIS — R001 Bradycardia, unspecified: Secondary | ICD-10-CM | POA: Diagnosis present

## 2015-08-03 DIAGNOSIS — I441 Atrioventricular block, second degree: Secondary | ICD-10-CM

## 2015-08-03 LAB — TSH: TSH: 1.315 u[IU]/mL (ref 0.350–4.500)

## 2015-08-03 LAB — CBC
HCT: 26.7 % — ABNORMAL LOW (ref 36.0–46.0)
Hemoglobin: 8.4 g/dL — ABNORMAL LOW (ref 12.0–15.0)
MCH: 23.5 pg — AB (ref 26.0–34.0)
MCHC: 31.5 g/dL (ref 30.0–36.0)
MCV: 74.8 fL — AB (ref 78.0–100.0)
PLATELETS: 634 10*3/uL — AB (ref 150–400)
RBC: 3.57 MIL/uL — ABNORMAL LOW (ref 3.87–5.11)
RDW: 27.5 % — AB (ref 11.5–15.5)
WBC: 12.7 10*3/uL — ABNORMAL HIGH (ref 4.0–10.5)

## 2015-08-03 LAB — BASIC METABOLIC PANEL
Anion gap: 9 (ref 5–15)
BUN: 22 mg/dL — AB (ref 6–20)
CO2: 26 mmol/L (ref 22–32)
CREATININE: 1.17 mg/dL — AB (ref 0.44–1.00)
Calcium: 9 mg/dL (ref 8.9–10.3)
Chloride: 106 mmol/L (ref 101–111)
GFR calc Af Amer: 52 mL/min — ABNORMAL LOW (ref >60)
GFR calc non Af Amer: 45 mL/min — ABNORMAL LOW (ref >60)
Glucose, Bld: 97 mg/dL (ref 65–99)
Potassium: 3.6 mmol/L (ref 3.5–5.1)
Sodium: 141 mmol/L (ref 135–145)

## 2015-08-03 LAB — TROPONIN I
Troponin I: <0.03 ng/mL (ref <0.031)
Troponin I: <0.03 ng/mL (ref <0.031)
Troponin I: <0.03 ng/mL (ref <0.031)

## 2015-08-03 MED ORDER — SODIUM CHLORIDE 0.9 % IV BOLUS (SEPSIS)
500.0000 mL | Freq: Once | INTRAVENOUS | Status: AC
  Administered 2015-08-03: 500 mL via INTRAVENOUS

## 2015-08-03 MED ORDER — CARVEDILOL 3.125 MG PO TABS: 3.1250 mg | ORAL_TABLET | Freq: Two times a day (BID) | ORAL | Status: DC

## 2015-08-03 MED ORDER — CETYLPYRIDINIUM CHLORIDE 0.05 % MT LIQD
7.0000 mL | Freq: Two times a day (BID) | OROMUCOSAL | Status: DC
  Administered 2015-08-03 – 2015-08-06 (×7): 7 mL via OROMUCOSAL

## 2015-08-03 NOTE — Evaluation (Signed)
Physical Therapy Evaluation Patient Details Name: Ashlee Mack MRN: [id number] DOB: [birth year] Today's Date: 08/03/2015   History of Present Illness  74 yo female admitted with syncope, 2nd degree AV block, orthostasis. Just discharged from [facility] on 11/19 before having to be readmitted again. Hx of HTN, generlized weakness, CKD.   Clinical Impression  On eval, pt required Min guard assist for mobility-walked ~75 feet with RW. See vitals section for orthostatic BP readings. Pt tolerated activity well. Pt denied lightheadedness/dizziness.     Follow Up Recommendations Home health PT;Supervision/Assistance - 24 hour    Equipment Recommendations  None recommended by PT    Recommendations for Other Services OT consult     Precautions / Restrictions Precautions Precautions: Fall Restrictions Weight Bearing Restrictions: No      Mobility  Bed Mobility Overal bed mobility: Needs Assistance Bed Mobility: Supine to Sit;Sit to Supine     Supine to sit: Min guard Sit to supine: Min guard   General bed mobility comments: Increased time.   Transfers Overall transfer level: Needs assistance Equipment used: Rolling walker (2 wheeled) Transfers: Sit to/from Stand Sit to Stand: Min guard         General transfer comment: close guard for safety  Ambulation/Gait Ambulation/Gait assistance: Min guard Ambulation Distance (Feet): 75 Feet Assistive device: Rolling walker (2 wheeled) Gait Pattern/deviations: Step-through pattern;Decreased stride length     General Gait Details: slow but steady pace with RW, some difficulty manuevering RW. Pt denied dizziness/lightheadedness  Stairs            Wheelchair Mobility    Modified Rankin (Stroke Patients Only)       Balance                                             Pertinent Vitals/Pain Pain Assessment: No/denies pain    Home Living Family/patient expects to be discharged to:: Private  residence Living Arrangements: Spouse/significant other   Type of Home: Apartment       Home Layout: One level Home Equipment: Environmental consultant - 2 wheels      Prior Function Level of Independence: Needs assistance   Gait / Transfers Assistance Needed: uses RW  ADL's / Homemaking Assistance Needed: spouse states she requires assist for ADLs sometimes, "does what she can"        Hand Dominance        Extremity/Trunk Assessment   Upper Extremity Assessment: Generalized weakness           Lower Extremity Assessment: Generalized weakness      Cervical / Trunk Assessment: Kyphotic  Communication   Communication: No difficulties  Cognition Arousal/Alertness: Awake/alert Behavior During Therapy: Flat affect Overall Cognitive Status: Impaired/Different from baseline                 General Comments: pt had difficulty with PLOF questions.  Pt was incontinent of  urine and did not ask to use bathroom    General Comments      Exercises        Assessment/Plan    PT Assessment Patient needs continued PT services  PT Diagnosis Difficulty walking;Generalized weakness   PT Problem List Decreased strength;Decreased activity tolerance;Decreased mobility  PT Treatment Interventions DME instruction;Gait training;Functional mobility training;Therapeutic activities;Patient/family education;Therapeutic exercise   PT Goals (Current goals can be found in the Care Plan section) Acute Rehab PT Goals Patient Stated  Goal: none stated; agreeable to working with OT PT Goal Formulation: With patient Time For Goal Achievement: 08/17/15 Potential to Achieve Goals: Good    Frequency Min 3X/week   Barriers to discharge        Co-evaluation               End of Session Equipment Utilized During Treatment: Gait belt Activity Tolerance: Patient tolerated treatment well Patient left: in bed;with call bell/phone within reach;with bed alarm set;with family/visitor present       Functional Assessment Tool Used: clinical judgement Functional Limitation: Mobility: Walking and moving around Mobility: Walking and Moving Around Current Status [id number]): At least 1 percent but less than 20 percent impaired, limited or restricted Mobility: Walking and Moving Around Goal Status [phone]): At least 1 percent but less than 20 percent impaired, limited or restricted    Time: 1345-1406 PT Time Calculation (min) (ACUTE ONLY): 21 min   Charges:   PT Evaluation $Initial PT Evaluation Tier I: 1 Procedure     PT G Codes:   PT G-Codes **NOT FOR INPATIENT CLASS** Functional Assessment Tool Used: clinical judgement Functional Limitation: Mobility: Walking and moving around Mobility: Walking and Moving Around Current Status [id number]): At least 1 percent but less than 20 percent impaired, limited or restricted Mobility: Walking and Moving Around Goal Status [phone]): At least 1 percent but less than 20 percent impaired, limited or restricted    Weston Anna, MPT Pager: [phone]

## 2015-08-03 NOTE — Progress Notes (Addendum)
[facility] PROGRESS NOTE  Ashlee Mack [id number] DOB: 10/03/1940 DOA: 08/02/2015 PCP: Lance Bosch, NP  Assessment/Plan: #1 syncope Likely cardiogenic in nature in addition to orthostasis. Patient is noted to have multiple dropped beats since 6 AM this morning approximately 8-9. Telemetry monitor looks like patient may have a second-degree AV block. Patient also noted to be orthostatic however was asymptomatic during orthostasis. Will cycle cardiac enzymes every 6 hours 3. Check a TSH. Check a 2-D echo. CT head pending. Will discontinue patient's Norvasc and Coreg. Cardiology consultation.  #2 second-degree AV block Cycle cardiac enzymes every 6 hours 3. Check a TSH. Check a 2-D echo. Discontinue Norvasc and Coreg. Follow.  #3 orthostasis Discontinued and Norvasc and Coreg. Given a fluid bolus. IV fluids. TED hose. Follow.  #4 hypertension Stable. Will discontinue Norvasc and Coreg secondary to problems #1 and 2. Follow for now.  #5 recent UTI Continue oral Ceftin.  #6 coronary artery disease Stable. Patient noted to have second-degree AV block on telemetry. Patient's Coreg and Norvasc have been discontinued. Cycle cardiac enzymes every 6 hours 3. Check a TSH. Check a 2-D echo. Continue aspirin and statin.  #7 malnutrition Nutritional supplementation.  #8 chronic kidney disease stage III Stable.  #9 chronic anemia Stable.  #10 hyperlipidemia Continue statin.  #11 prophylaxis Lovenox for DVT prophylaxis.  Code Status: DO NOT RESUSCITATE Family Communication: Updated patient and husband at bedside. Disposition Plan: Home with home health was etiology of syncope and management has been achieved, and orthostasis has resolved.   Consultants:  None  Procedures:  CT head 08/03/2015    Antibiotics:  Oral Ceftin 08/03/2015  HPI/Subjective: Patient complaining of tiredness. Patient denies any chest pain. No shortness of breath. Patient denies any  dizziness on standing.   Objective: Filed Vitals:   08/03/15 0915  BP: 135/61  Pulse:   Temp:   Resp:     Intake/Output Summary (Last 24 hours) at 08/03/15 1003 Last data filed at 08/03/15 0600  Gross per 24 hour  Intake 446.25 ml  Output      0 ml  Net 446.25 ml   Filed Weights   08/02/15 2353  Weight: 46.494 kg (102 lb 8 oz)    Exam:   General:  NAD  Cardiovascular: Bradycardia  Respiratory: CTAB  Abdomen: Soft, nontender, nondistended, positive bowel sounds.  Musculoskeletal: No clubbing /cyanosis or edema.  Data Reviewed: Basic Metabolic Panel:  Recent Labs Lab 07/31/15 0115 07/31/15 0415 08/01/15 0509 08/02/15 0533 08/02/15 2038 08/03/15 0535  NA 136  --  140 140 137 141  K 3.0*  --  3.5 3.6 3.2* 3.6  CL 97*  --  101 101 99* 106  CO2 28  --  29 27 26 26   GLUCOSE 210*  --  95 103* 129* 97  BUN 28*  --  26* 25* 27* 22*  CREATININE 1.36*  --  1.28* 1.30* 1.26* 1.17*  CALCIUM 8.6*  --  8.9 9.0 9.3 9.0  MG  --  2.4  --   --   --   --    Liver Function Tests:  Recent Labs Lab 07/30/15 1533 07/31/15 0115  AST 48* 42*  ALT 37 32  ALKPHOS 61 54  BILITOT 0.7 0.3  PROT 9.0* 7.9  ALBUMIN 4.2 3.6    Recent Labs Lab 07/30/15 1533 07/31/15 0415  LIPASE 56* 46   No results for input(s): AMMONIA in the last 168 hours. CBC:  Recent Labs Lab 07/30/15 1533 07/31/15 0115  08/01/15 0509 08/02/15 2038 08/03/15 0535  WBC 9.9 9.8 9.3 11.2* 12.7*  HGB 9.2* 8.6* 8.3* 8.8* 8.4*  HCT 29.2* 27.1* 26.8* 27.6* 26.7*  MCV 73.9* 75.3* 75.3* 73.6* 74.8*  PLT 777* 585* 594* 695* 634*   Cardiac Enzymes:  Recent Labs Lab 07/31/15 0115  CKTOTAL 37*  TROPONINI <0.03   BNP (last 3 results)  Recent Labs  02/09/15 2017  BNP 79.5    ProBNP (last 3 results) No results for input(s): PROBNP in the last 8760 hours.  CBG:  Recent Labs Lab 07/30/15 1604 08/02/15 2042  GLUCAP 113* 132*    Recent Results (from the past 240 hour(s))  Culture,  Urine     Status: None   Collection Time: 07/31/15 10:55 AM  Result Value Ref Range Status   Specimen Description URINE, CLEAN CATCH  Final   Special Requests NONE  Final   Culture   Final    NO GROWTH 1 DAY Performed at [facility]    Report Status 08/01/2015 FINAL  Final     Studies: Nm Hepatobiliary Liver Func  08/01/2015  CLINICAL DATA:  Generalized weakness, epigastric pain for 1 week EXAM: NUCLEAR MEDICINE HEPATOBILIARY IMAGING TECHNIQUE: Sequential images of the abdomen were obtained out to 60 minutes following intravenous administration of radiopharmaceutical. RADIOPHARMACEUTICALS:  5.5 mCi Tc-50m  Choletec IV COMPARISON:  CT scan 07/30/2015 FINDINGS: There is normal uptake of the tracer by the liver. Gallbladder visualized at 35 minutes. CBD visualized at 41 minutes. Bowel activity noted at 60 minutes. IMPRESSION: 1. No cystic duct obstruction.  No CBD obstruction. Electronically Signed   By: Lahoma Crocker M.D.   On: 08/01/2015 12:04    Scheduled Meds: . antiseptic oral rinse  7 mL Mouth Rinse BID  . aspirin EC  81 mg Oral Daily  . atorvastatin  10 mg Oral q1800  . cefUROXime  250 mg Oral BID WC  . enoxaparin (LOVENOX) injection  30 mg Subcutaneous Q24H  . feeding supplement (ENSURE ENLIVE)  237 mL Oral BID BM  . ferrous sulfate  325 mg Oral Q breakfast  . senna-docusate  1 tablet Oral QHS  . sodium chloride  500 mL Intravenous Once  . sodium chloride  3 mL Intravenous Q12H   Continuous Infusions: . sodium chloride 75 mL/hr at 08/03/15 0003    Principal Problem:   Syncope Active Problems:   AV block, 2nd degree   Orthostasis   Chronic venous stasis dermatitis of both lower extremities   Hypokalemia   Protein-calorie malnutrition, severe (HCC)   Coronary artery disease   Essential hypertension   Constipation   CKD (chronic kidney disease) stage 3, GFR 30-59 ml/min   AKI (acute kidney injury) ([place])    Time spent: [facility] Pager [phone]. If 7PM-7AM, please contact night-coverage at [email or web address], password [facility] 08/03/2015, 10:03 AM     Addendum: 08/03/2015 10:42 AM Was called by radiology that patient has an area on head CT worrisome for acute versus chronic infarct. Will check a MRI of the head. If MRI of head is positive patient will need a stroke workup including carotid Dopplers, 2-D echo, and a neurology consult.

## 2015-08-03 NOTE — Consult Note (Signed)
Primary cardiologist: New  HPI: 74 yo female with renal insufficiency, CAD, anemia, HTN for evaluation of syncope. Echo 7/15 showed normal LV function, mild LAE, mild MR. Recently DCed following admission for generalized weakness and UTI. Patient was at home and was helped to the bathroom.  While sitting she had a syncopal episode. No preceding nausea, dyspnea, chest pain or palpitations. Note she had not started urinating or having a bowel movement. She states she was unconscious for approximately  3 minutes. No incontinence. She has had weakness since which is unchanged. She denies dyspnea on exertion, orthopnea, PND, pedal edema or exertional chest pain. On telemetry she was noted to have intermittent bradycardia and cardiology was asked to evaluate.  Medications Prior to Admission  Medication Sig Dispense Refill  . amLODipine (NORVASC) 10 MG tablet Take 1 tablet (10 mg total) by mouth daily. 30 tablet 5  . aspirin EC 81 MG tablet Take 1 tablet (81 mg total) by mouth daily. 30 tablet 5  . atorvastatin (LIPITOR) 10 MG tablet Take 1 tablet (10 mg total) by mouth daily. For cholesterol 30 tablet 0  . cefUROXime (CEFTIN) 250 MG tablet Take 1 tablet (250 mg total) by mouth 2 (two) times daily with a meal. Take for 5 days then stop. 10 tablet 0  . feeding supplement, ENSURE COMPLETE, (ENSURE COMPLETE) LIQD Take 237 mLs by mouth 2 (two) times daily between meals.    . ferrous sulfate 325 (65 FE) MG tablet Take 1 tablet (325 mg total) by mouth daily with breakfast. 30 tablet 3  . polyethylene glycol (MIRALAX / GLYCOLAX) packet Take 17 g by mouth 2 (two) times daily. Hold if develops diarrhea. 14 each 0  . senna-docusate (SENOKOT-S) 8.6-50 MG tablet Take 1 tablet by mouth at bedtime.    . carvedilol (COREG) 12.5 MG tablet Take 12.5 mg by mouth 2 (two) times daily with a meal.      No Known Allergies   Past Medical History  Diagnosis Date  . Coronary artery disease     Patient denies  .  Hypertension   . CKD (chronic kidney disease) stage 3, GFR 30-59 ml/min 07/31/2015  . Hyperlipidemia     Past Surgical History  Procedure Laterality Date  . Coronary angioplasty with stent placement      Social History   Social History  . Marital Status: Married    Spouse Name: N/A  . Number of Children: 3  . Years of Education: N/A   Occupational History  . Not on file.   Social History Main Topics  . Smoking status: Never Smoker   . Smokeless tobacco: Not on file  . Alcohol Use: No  . Drug Use: No  . Sexual Activity: Not on file   Other Topics Concern  . Not on file   Social History Narrative    Family History  Problem Relation Age of Onset  . Hypertension Mother   . Hypertension Father     ROS:  Generalized weakness but no fevers or chills, productive cough, hemoptysis, dysphasia, odynophagia, melena, hematochezia, dysuria, hematuria, rash, seizure activity, orthopnea, PND, pedal edema, claudication. Remaining systems are negative.  Physical Exam:   Blood pressure 135/61, pulse 63, temperature 98.7 F (37.1 C), temperature source Oral, resp. rate 18, height 5' 8"  (1.727 m), weight 46.494 kg (102 lb 8 oz), SpO2 100 %.  General:  Well developed/frail in NAD Skin warm/dry Patient not depressed No peripheral clubbing Back-normal HEENT-normal/normal eyelids Neck supple/normal carotid upstroke  bilaterally; no bruits; no JVD; no thyromegaly chest - CTA/ normal expansion CV - RRR/normal S1 and S2; no murmurs, rubs or gallops;  PMI nondisplaced Abdomen -NT/ND, no HSM, no mass, + bowel sounds, no bruit 2+ femoral pulses, no bruits Ext-no edema, chords, 2+ DP Neuro-grossly nonfocal  ECG NSR, septal MI, nonspecific ST changes, prolonged QT  Results for orders placed or performed during the hospital encounter of 08/02/15 (from the past 48 hour(s))  Basic metabolic panel     Status: Abnormal   Collection Time: 08/02/15  8:38 PM  Result Value Ref Range   Sodium  137 135 - 145 mmol/L   Potassium 3.2 (L) 3.5 - 5.1 mmol/L   Chloride 99 (L) 101 - 111 mmol/L   CO2 26 22 - 32 mmol/L   Glucose, Bld 129 (H) 65 - 99 mg/dL   BUN 27 (H) 6 - 20 mg/dL   Creatinine, Ser 1.26 (H) 0.44 - 1.00 mg/dL   Calcium 9.3 8.9 - 10.3 mg/dL   GFR calc non Af Amer 41 (L) >60 mL/min   GFR calc Af Amer 48 (L) >60 mL/min    Comment: (NOTE) The eGFR has been calculated using the CKD EPI equation. This calculation has not been validated in all clinical situations. eGFR's persistently <60 mL/min signify possible Chronic Kidney Disease.    Anion gap 12 5 - 15  CBC     Status: Abnormal   Collection Time: 08/02/15  8:38 PM  Result Value Ref Range   WBC 11.2 (H) 4.0 - 10.5 K/uL   RBC 3.75 (L) 3.87 - 5.11 MIL/uL   Hemoglobin 8.8 (L) 12.0 - 15.0 g/dL   HCT 27.6 (L) 36.0 - 46.0 %   MCV 73.6 (L) 78.0 - 100.0 fL   MCH 23.5 (L) 26.0 - 34.0 pg   MCHC 31.9 30.0 - 36.0 g/dL   RDW 27.3 (H) 11.5 - 15.5 %   Platelets 695 (H) 150 - 400 K/uL  CBG monitoring, ED     Status: Abnormal   Collection Time: 08/02/15  8:42 PM  Result Value Ref Range   Glucose-Capillary 132 (H) 65 - 99 mg/dL  I-stat troponin, ED (not at [facility])     Status: None   Collection Time: 08/02/15  8:44 PM  Result Value Ref Range   Troponin i, poc 0.01 0.00 - 0.08 ng/mL   Comment 3            Comment: Due to the release kinetics of cTnI, a negative result within the first hours of the onset of symptoms does not rule out myocardial infarction with certainty. If myocardial infarction is still suspected, repeat the test at appropriate intervals.   Urinalysis, Routine w reflex microscopic (not at [facility])     Status: Abnormal   Collection Time: 08/02/15 10:00 PM  Result Value Ref Range   Color, Urine YELLOW YELLOW   APPearance CLOUDY (A) CLEAR   Specific Gravity, Urine 1.011 1.005 - 1.030   pH 7.0 5.0 - 8.0   Glucose, UA NEGATIVE NEGATIVE mg/dL   Hgb urine dipstick NEGATIVE NEGATIVE   Bilirubin Urine NEGATIVE  NEGATIVE   Ketones, ur NEGATIVE NEGATIVE mg/dL   Protein, ur NEGATIVE NEGATIVE mg/dL   Nitrite NEGATIVE NEGATIVE   Leukocytes, UA NEGATIVE NEGATIVE    Comment: MICROSCOPIC NOT DONE ON URINES WITH NEGATIVE PROTEIN, BLOOD, LEUKOCYTES, NITRITE, OR GLUCOSE <1000 mg/dL.  Basic metabolic panel     Status: Abnormal   Collection Time: 08/03/15  5:35 AM  Result Value Ref Range   Sodium 141 135 - 145 mmol/L   Potassium 3.6 3.5 - 5.1 mmol/L   Chloride 106 101 - 111 mmol/L   CO2 26 22 - 32 mmol/L   Glucose, Bld 97 65 - 99 mg/dL   BUN 22 (H) 6 - 20 mg/dL   Creatinine, Ser 1.17 (H) 0.44 - 1.00 mg/dL   Calcium 9.0 8.9 - 10.3 mg/dL   GFR calc non Af Amer 45 (L) >60 mL/min   GFR calc Af Amer 52 (L) >60 mL/min    Comment: (NOTE) The eGFR has been calculated using the CKD EPI equation. This calculation has not been validated in all clinical situations. eGFR's persistently <60 mL/min signify possible Chronic Kidney Disease.    Anion gap 9 5 - 15  CBC     Status: Abnormal   Collection Time: 08/03/15  5:35 AM  Result Value Ref Range   WBC 12.7 (H) 4.0 - 10.5 K/uL   RBC 3.57 (L) 3.87 - 5.11 MIL/uL   Hemoglobin 8.4 (L) 12.0 - 15.0 g/dL   HCT 26.7 (L) 36.0 - 46.0 %   MCV 74.8 (L) 78.0 - 100.0 fL   MCH 23.5 (L) 26.0 - 34.0 pg   MCHC 31.5 30.0 - 36.0 g/dL   RDW 27.5 (H) 11.5 - 15.5 %   Platelets 634 (H) 150 - 400 K/uL  TSH     Status: None   Collection Time: 08/03/15 10:28 AM  Result Value Ref Range   TSH 1.315 0.350 - 4.500 uIU/mL  Troponin I (q 6hr x 3)     Status: None   Collection Time: 08/03/15 10:28 AM  Result Value Ref Range   Troponin I <0.03 <0.031 ng/mL    Comment:        NO INDICATION OF MYOCARDIAL INJURY.     Ct Head Wo Contrast  08/03/2015  CLINICAL DATA:  Syncopal episode at home. History of chronic kidney disease. EXAM: CT HEAD WITHOUT CONTRAST TECHNIQUE: Contiguous axial images were obtained from the base of the skull through the vertex without intravenous contrast.  COMPARISON:  03/24/2014 FINDINGS: No mass effect or midline shift. No evidence of acute intracranial hemorrhage. There are brain parenchymal atrophy and deep white matter microvascular changes. 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 There is hypoattenuated appearance of the brainstem at the interface of pons with medulla oblongata. No abnormal extra-axial fluid collections. Basal cisterns are preserved. No depressed skull fractures. Visualized paranasal sinuses and mastoid air cells are not opacified. IMPRESSION: Area of hypoattenuation within the brain stem, with which acute infarct cannot be excluded. 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 Brain parenchymal volume loss and microvascular changes. These results were called by telephone at the time of interpretation on 08/03/2015 at 10:39 am to Dr. Irine Seal , who verbally acknowledged these results. Electronically Signed   By: Fidela Salisbury M.D.   On: 08/03/2015 10:41    Assessment/Plan 1 syncope etiology unclear. She was noted to have bradycardia on telemetry. I have reviewed the strips. She has sinus to sinus bradycardia with nonconducted PACs and intermittent junctional beats. However she was on carvedilol 12.5 mg twice a day at the time of admission. I would discontinue this medication and follow on telemetry. Her bradycardia will likely resolve with holding this medication. Await echocardiogram. Can likely proceed with an event monitor following discharge. 2 bradycardia-as above.  3  Hypertension-okay to resume amlodipine. 4 microcytic  anemia-further evaluation per primary care. 5 generalized weakness-further evaluation for primary care. TSH normal. 6 CAD continue aspirin and statin.  Kirk Ruths MD 08/03/2015, 1:40 PM

## 2015-08-04 ENCOUNTER — Observation Stay (HOSPITAL_BASED_OUTPATIENT_CLINIC_OR_DEPARTMENT_OTHER): Payer: Medicare Other

## 2015-08-04 ENCOUNTER — Telehealth: Payer: Self-pay

## 2015-08-04 ENCOUNTER — Observation Stay (HOSPITAL_COMMUNITY): Payer: Medicare Other

## 2015-08-04 DIAGNOSIS — R55 Syncope and collapse: Secondary | ICD-10-CM

## 2015-08-04 DIAGNOSIS — N183 Chronic kidney disease, stage 3 (moderate): Secondary | ICD-10-CM | POA: Diagnosis not present

## 2015-08-04 DIAGNOSIS — I10 Essential (primary) hypertension: Secondary | ICD-10-CM | POA: Diagnosis not present

## 2015-08-04 DIAGNOSIS — R001 Bradycardia, unspecified: Secondary | ICD-10-CM | POA: Diagnosis not present

## 2015-08-04 LAB — BASIC METABOLIC PANEL
ANION GAP: 7 (ref 5–15)
BUN: 19 mg/dL (ref 6–20)
CALCIUM: 9 mg/dL (ref 8.9–10.3)
CO2: 28 mmol/L (ref 22–32)
Chloride: 105 mmol/L (ref 101–111)
Creatinine, Ser: 1.11 mg/dL — ABNORMAL HIGH (ref 0.44–1.00)
GFR calc Af Amer: 56 mL/min — ABNORMAL LOW (ref >60)
GFR, EST NON AFRICAN AMERICAN: 48 mL/min — AB (ref >60)
GLUCOSE: 89 mg/dL (ref 65–99)
POTASSIUM: 3.8 mmol/L (ref 3.5–5.1)
SODIUM: 140 mmol/L (ref 135–145)

## 2015-08-04 LAB — MAGNESIUM: MAGNESIUM: 2.2 mg/dL (ref 1.7–2.4)

## 2015-08-04 LAB — CBC
HCT: 25.9 % — ABNORMAL LOW (ref 36.0–46.0)
Hemoglobin: 8.2 g/dL — ABNORMAL LOW (ref 12.0–15.0)
MCH: 23.8 pg — AB (ref 26.0–34.0)
MCHC: 31.7 g/dL (ref 30.0–36.0)
MCV: 75.1 fL — ABNORMAL LOW (ref 78.0–100.0)
PLATELETS: 574 10*3/uL — AB (ref 150–400)
RBC: 3.45 MIL/uL — AB (ref 3.87–5.11)
RDW: 27.7 % — AB (ref 11.5–15.5)
WBC: 8.5 10*3/uL (ref 4.0–10.5)

## 2015-08-04 MED ORDER — POLYETHYLENE GLYCOL 3350 17 G PO PACK
17.0000 g | PACK | Freq: Two times a day (BID) | ORAL | Status: DC
  Administered 2015-08-04 – 2015-08-06 (×4): 17 g via ORAL
  Filled 2015-08-04 (×4): qty 1

## 2015-08-04 MED ORDER — LORAZEPAM 2 MG/ML IJ SOLN
0.5000 mg | Freq: Two times a day (BID) | INTRAMUSCULAR | Status: DC | PRN
  Administered 2015-08-04: 0.5 mg via INTRAVENOUS
  Filled 2015-08-04: qty 1

## 2015-08-04 MED ORDER — LORAZEPAM 2 MG/ML IJ SOLN: 0.5000 mg | Freq: Four times a day (QID) | INTRAMUSCULAR | Status: DC | PRN

## 2015-08-04 NOTE — Progress Notes (Signed)
Initial Nutrition Assessment  DOCUMENTATION CODES:   Severe malnutrition in context of acute illness/injury, Underweight  INTERVENTION:  - Continue Ensure Enlive BID, each supplement provides 350 kcal and 20 grams of protein - Encourage PO intakes at meals and with supplements - RD will continue to monitor for needs  NUTRITION DIAGNOSIS:   Inadequate oral intake related to poor appetite as evidenced by per patient/family report.  GOAL:   Patient will meet greater than or equal to 90% of their needs  MONITOR:   PO intake, Supplement acceptance, Weight trends, Labs, Skin, I & O's  REASON FOR ASSESSMENT:   Malnutrition Screening Tool  ASSESSMENT:   74 y.o. female with a history of CAD, HTN, Stage III CKD who presents to the ED after suffering a syncopal event in her home. She was witnessed as passing out when she went to the bathroom and she was passed out for about 3 minutes. She was brought to the ED for further evaluation. She was discharged in the AM after a hospitalization for UTI.   Pt seen for MST. BMI indicates underweight status. Per chart review, pt ate 50% of breakfast yesterday (11/20). RN ordered breakfast for pt this AM after RD visit. Empty Ensure Enlive on bedside table and it was unable to be made clear if this was from this AM or from yesterday.   Pt recently d/c'ed from the hospital. She states poor appetite during last hospitalization. She denies abdominal pain or nausea this AM. RN working with pt to prepare for testing this AM and MD also on the phone talking with RN and pt's husband, who is at bedside; unable to obtain other information at this time.  Physical assessment showed moderate and severe muscle and moderate fat wasting. No edema present. Per chart review, pt has lost 5 lbs (4.7%) in the past 2.5 months which is not significant for time frame.   Likely not meeting needs. Ensure Jeanne Ivan has already been ordered BID. Medications reviewed. Labs  reviewed; creatinine elevated, GFR: 56.   Diet Order:  Diet Heart Room service appropriate?: Yes; Fluid consistency:: Thin  Skin:  Wound (see comment) (Stage 1 sacral pressure ulcer)  Last BM:  11/19  Height:   Ht Readings from Last 1 Encounters:  08/02/15 5\' 8"  (1.727 m)    Weight:   Wt Readings from Last 1 Encounters:  08/02/15 102 lb 8 oz (46.494 kg)    Ideal Body Weight:  63.64 kg (kg)  BMI:  Body mass index is 15.59 kg/(m^2).  Estimated Nutritional Needs:   Kcal:  1400-1600  Protein:  60-70 grams  Fluid:  1.8- 2 L/day  EDUCATION NEEDS:   No education needs identified at this time     Jarome Matin, RD, LDN Inpatient Clinical Dietitian Pager # [phone] After hours/weekend pager # [phone]

## 2015-08-04 NOTE — Progress Notes (Signed)
  Echocardiogram 2D Echocardiogram has been performed.  Darlina Sicilian M 08/04/2015, 12:01 PM

## 2015-08-04 NOTE — Telephone Encounter (Signed)
Call placed to the patient's son,  Takerra Quintiliani # [phone] to discuss follow up for his mother at the [facility] ( [facility]) . Carmela Hurt, RN CM with the [facility] received approval from the patient to discuss the plans for medical follow up with her son, Jeannette Corpus.   Braggi noted that his mother is still in the hospital and he is not aware of a discharge date at this time.  This CM explained about the close follow up that is provided by the [facility] and he said that he would like CM to schedule an appointment for his mother with the [facility] when it is determined  she will be going home.  He was very appreciative of the call.   CM to continue to follow her hospital progress.

## 2015-08-04 NOTE — Progress Notes (Signed)
[facility] PROGRESS NOTE  Ashlee Mack [id number] DOB: Nov 23, 1940 DOA: 08/02/2015 PCP: Lance Bosch, NP  Assessment/Plan: #1 syncope Likely cardiogenic in nature in addition to orthostasis. Patient is noted to have bradycardia with nonconducted PACs and intermittent junctional beats per cardiology. These are still noted on telemetry. Orthostasis have improved. Cardiac enzymes negative 3. TSH within normal limits. 2-D echo pending. CT head with area of hypoattenuation within the brainstem with which acute infarct cannot be excluded. 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 Brain parenchymal volume loss and microvascular changes. MRI of brain negative for any acute abnormalities. Moderate chronic small vessel ischemic disease with chronic lacunar infarcts and chronic microhemorrhages. Coreg and Norvasc have been discontinued. No further syncopal episodes. Patient may benefit from an event monitor as outpatient. Cardiology following.   #2 bradycardia Patient noted to be bradycardic on telemetry initially was felt patient had missed beats however, it was felt per cardiology that patient had frequent nonconducted PACs and intermittent junctional beats. Cardiac enzymes were negative 3. TSH within normal limits. 2-D echo pending. Norvasc and Coreg have been discontinued. Will continue to monitor on telemetry. Per cardiology. Patient will likely need an event monitor on discharge.  #3 orthostasis Discontinued and Norvasc and Coreg. Patient currently asymptomatic. Orthostasis has improved. Continue gentle hydration. TED hose. Follow.  #4 hypertension Stable. Norvasc and Coreg have been discontinued. Blood pressure currently stable. If further blood pressure control is needed okay to resume Norvasc per cardiology.   #5 recent UTI Continue oral Ceftin.  #6 coronary artery disease Stable. Patient noted to have significant bradycardia with frequent  nonconducted PACs and intermittent junctional beats. Coreg has been discontinued. Cardiac enzymes negative 3. TSH within normal limits. 2-D echo pending.  Continue aspirin and statin. Cardiology following.  #7 malnutrition Nutritional supplementation.  #8 chronic kidney disease stage III Stable.  #9 chronic anemia Stable.  #10 hyperlipidemia Continue statin.  #11 prophylaxis Lovenox for DVT prophylaxis.  Code Status: DO NOT RESUSCITATE Family Communication: Updated patient and husband at bedside. Disposition Plan: Home with home health was etiology of syncope and management has been achieved, and orthostasis has resolved.   Consultants:  Cardiology: Dr. Stanford Breed 08/03/2015  Procedures:  CT head 08/03/2015  MRI head 08/04/2015  2-D echo 08/04/2015  Antibiotics:  Oral Ceftin 08/03/2015  HPI/Subjective: Patient with no complaints. No further syncopal episodes. Denies any dizziness.  Objective: Filed Vitals:   08/04/15 0211 08/04/15 0653  BP: 112/62 121/64  Pulse: 63 61  Temp: 98.3 F (36.8 C) 98.8 F (37.1 C)  Resp: 16 18    Intake/Output Summary (Last 24 hours) at 08/04/15 1154 Last data filed at 08/03/15 1700  Gross per 24 hour  Intake    120 ml  Output      0 ml  Net    120 ml   Filed Weights   08/02/15 2353  Weight: 46.494 kg (102 lb 8 oz)    Exam:   General:  NAD  Cardiovascular: Bradycardia  Respiratory: CTAB  Abdomen: Soft, nontender, nondistended, positive bowel sounds.  Musculoskeletal: No clubbing /cyanosis or edema.  Data Reviewed: Basic Metabolic Panel:  Recent Labs Lab 07/31/15 0415 08/01/15 0509 08/02/15 0533 08/02/15 2038 08/03/15 0535 08/04/15 0541  NA  --  140 140 137 141 140  K  --  3.5 3.6 3.2* 3.6 3.8  CL  --  101 101 99* 106 105  CO2  --  29 27 26 26 28   GLUCOSE  --  95  103* 129* 97 89  BUN  --  26* 25* 27* 22* 19  CREATININE  --  1.28* 1.30* 1.26* 1.17* 1.11*  CALCIUM  --  8.9 9.0 9.3 9.0 9.0  MG 2.4   --   --   --   --  2.2   Liver Function Tests:  Recent Labs Lab 07/30/15 1533 07/31/15 0115  AST 48* 42*  ALT 37 32  ALKPHOS 61 54  BILITOT 0.7 0.3  PROT 9.0* 7.9  ALBUMIN 4.2 3.6    Recent Labs Lab 07/30/15 1533 07/31/15 0415  LIPASE 56* 46   No results for input(s): AMMONIA in the last 168 hours. CBC:  Recent Labs Lab 07/31/15 0115 08/01/15 0509 08/02/15 2038 08/03/15 0535 08/04/15 0541  WBC 9.8 9.3 11.2* 12.7* 8.5  HGB 8.6* 8.3* 8.8* 8.4* 8.2*  HCT 27.1* 26.8* 27.6* 26.7* 25.9*  MCV 75.3* 75.3* 73.6* 74.8* 75.1*  PLT 585* 594* 695* 634* 574*   Cardiac Enzymes:  Recent Labs Lab 07/31/15 0115 08/03/15 1028 08/03/15 1645 08/03/15 2143  CKTOTAL 37*  --   --   --   TROPONINI <0.03 <0.03 <0.03 <0.03   BNP (last 3 results)  Recent Labs  02/09/15 2017  BNP 79.5    ProBNP (last 3 results) No results for input(s): PROBNP in the last 8760 hours.  CBG:  Recent Labs Lab 07/30/15 1604 08/02/15 2042  GLUCAP 113* 132*    Recent Results (from the past 240 hour(s))  Culture, Urine     Status: None   Collection Time: 07/31/15 10:55 AM  Result Value Ref Range Status   Specimen Description URINE, CLEAN CATCH  Final   Special Requests NONE  Final   Culture   Final    NO GROWTH 1 DAY Performed at [facility]    Report Status 08/01/2015 FINAL  Final     Studies: Ct Head Wo Contrast  08/03/2015  CLINICAL DATA:  Syncopal episode at home. History of chronic kidney disease. EXAM: CT HEAD WITHOUT CONTRAST TECHNIQUE: Contiguous axial images were obtained from the base of the skull through the vertex without intravenous contrast. COMPARISON:  03/24/2014 FINDINGS: No mass effect or midline shift. No evidence of acute intracranial hemorrhage. There are brain parenchymal atrophy and deep white matter microvascular changes. 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 There is hypoattenuated appearance  of the brainstem at the interface of pons with medulla oblongata. No abnormal extra-axial fluid collections. Basal cisterns are preserved. No depressed skull fractures. Visualized paranasal sinuses and mastoid air cells are not opacified. IMPRESSION: Area of hypoattenuation within the brain stem, with which acute infarct cannot be excluded. 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 Brain parenchymal volume loss and microvascular changes. These results were called by telephone at the time of interpretation on 08/03/2015 at 10:39 am to Dr. Irine Seal , who verbally acknowledged these results. Electronically Signed   By: Fidela Salisbury M.D.   On: 08/03/2015 10:41   Mr Brain Wo Contrast  08/04/2015  CLINICAL DATA:  Syncope. EXAM: MRI HEAD WITHOUT CONTRAST TECHNIQUE: Multiplanar, multiecho pulse sequences of the brain and surrounding structures were obtained without intravenous contrast. COMPARISON:  Head CT 08/03/2015 FINDINGS: There is no evidence of acute infarct, mass, midline shift, or extra-axial fluid collection. Foci of chronic microhemorrhage are present in the brainstem and deep gray nuclei bilaterally, likely reflecting sequelae of chronic hypertension. There is moderate cerebral atrophy. Numerous chronic lacunar infarcts  are present in the deep gray nuclei and deep cerebral white matter bilaterally as well as in the brainstem. Patchy T2 hyperintensities in the cerebral white matter are nonspecific but compatible with moderate chronic small vessel ischemic disease. Orbits are unremarkable. Paranasal sinuses and mastoid air cells are clear. Major intracranial vascular flow voids are preserved. IMPRESSION: 1. No acute intracranial abnormality. 2. 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 Electronically Signed   By: Logan Bores M.D.   On: 08/04/2015 11:12    Scheduled Meds: . antiseptic  oral rinse  7 mL Mouth Rinse BID  . aspirin EC  81 mg Oral Daily  . atorvastatin  10 mg Oral q1800  . cefUROXime  250 mg Oral BID WC  . enoxaparin (LOVENOX) injection  30 mg Subcutaneous Q24H  . feeding supplement (ENSURE ENLIVE)  237 mL Oral BID BM  . ferrous sulfate  325 mg Oral Q breakfast  . senna-docusate  1 tablet Oral QHS  . sodium chloride  3 mL Intravenous Q12H   Continuous Infusions:    Principal Problem:   Syncope Active Problems:   Bradycardia   Orthostasis   Chronic venous stasis dermatitis of both lower extremities   Hypokalemia   Protein-calorie malnutrition, severe (HCC)   Coronary artery disease   Essential hypertension   Constipation   CKD (chronic kidney disease) stage 3, GFR 30-59 ml/min   AKI (acute kidney injury) ([place])    Time spent: [facility] Pager [phone]. If 7PM-7AM, please contact night-coverage at [email or web address], password [facility] 08/04/2015, 11:54 AM

## 2015-08-04 NOTE — Progress Notes (Signed)
    Subjective: No dizziness.  Objective: Vital signs in last 24 hours: Temp:  [98.3 F (36.8 C)-98.9 F (37.2 C)] 98.8 F (37.1 C) (11/21 0653) Pulse Rate:  [61-66] 61 (11/21 0653) Resp:  [16-18] 18 (11/21 0653) BP: (112-135)/(61-73) 121/64 mmHg (11/21 0653) SpO2:  [99 %-100 %] 100 % (11/21 0653) Last BM Date: 08/02/15  Intake/Output from previous day: 11/20 0701 - 11/21 0700 In: 240 [P.O.:240] Out: -  Intake/Output this shift:    Medications Scheduled Meds: . antiseptic oral rinse  7 mL Mouth Rinse BID  . aspirin EC  81 mg Oral Daily  . atorvastatin  10 mg Oral q1800  . cefUROXime  250 mg Oral BID WC  . enoxaparin (LOVENOX) injection  30 mg Subcutaneous Q24H  . feeding supplement (ENSURE ENLIVE)  237 mL Oral BID BM  . ferrous sulfate  325 mg Oral Q breakfast  . senna-docusate  1 tablet Oral QHS  . sodium chloride  3 mL Intravenous Q12H   Continuous Infusions:  PRN Meds:.acetaminophen **OR** acetaminophen, alum & mag hydroxide-simeth, ondansetron **OR** ondansetron (ZOFRAN) IV, oxyCODONE  PE: General appearance: alert, cooperative and Appears very weak.  Lungs: clear to auscultation bilaterally Heart: regular rate and rhythm, S1, S2 normal, no murmur, click, rub or gallop Abdomen: +BS, nontender Extremities:  No LEE Pulses: 2+ and symmetric Skin: Warm and dry Neurologic: Grossly normal  Lab Results:   Recent Labs  08/02/15 2038 08/03/15 0535 08/04/15 0541  WBC 11.2* 12.7* 8.5  HGB 8.8* 8.4* 8.2*  HCT 27.6* 26.7* 25.9*  PLT 695* 634* 574*   BMET  Recent Labs  08/02/15 2038 08/03/15 0535 08/04/15 0541  NA 137 141 140  K 3.2* 3.6 3.8  CL 99* 106 105  CO2 26 26 28   GLUCOSE 129* 97 89  BUN 27* 22* 19  CREATININE 1.26* 1.17* 1.11*  CALCIUM 9.3 9.0 9.0      Assessment/Plan    Syncope Awaiting coreg washout.  Today will likely be day two without it, if she took it at home on 11/19.   Still having frequent non-conducted PACs and intermittent  junctional beats.  Plan for OP heart monitoring.  Echo pending.   If she continues to have bradycardia, and does not want a PPM, which is what she told me, then she will certainly need 24 hour supervision.        Chronic venous stasis dermatitis of both lower extremities   Hypokalemia: resolved   Protein-calorie malnutrition, severe (HCC)   Coronary artery disease   Essential hypertension  Controlled   Constipation   CKD (chronic kidney disease) stage 3, GFR 30-59 ml/min:  stable   AKI (acute kidney injury) ([place])   AV block, 2nd degree   Orthostasis   HAGER, BRYAN PA-C 08/04/2015 8:00 AM  As above; patient seen and examined; no chest pain or dyspnea; no dizziness; telemetry shows continued episodes of nonconducted PACs and junctional escape beats. Continue off coreg. Hopefully bradycardia will improve off of beta blocker. Patient states she would not be agreeable to pacemaker regardless. Echo pending. Kirk Ruths

## 2015-08-04 NOTE — Evaluation (Signed)
Occupational Therapy Evaluation Patient Details Name: Ashlee Mack MRN: [id number] DOB: 1941-05-03 Today's Date: 08/04/2015    History of Present Illness 74 yo female admitted with syncope, 2nd degree AV block, orthostasis. Just discharged from [facility] on 11/19 before having to be readmitted again. Hx of HTN, generlized weakness, CKD.    Clinical Impression   Pt admitted with syncope. Pt currently with functional limitations due to the deficits listed below (see OT Problem List).  Pt will benefit from skilled OT to increase their safety and independence with ADL and functional mobility for ADL to facilitate discharge to venue listed below.      Follow Up Recommendations  Home health OT;Supervision/Assistance - 24 hour [facility] aide; depending upon progress)    Equipment Recommendations  3 in 1 bedside comode       Precautions / Restrictions Precautions Precautions: Fall Restrictions Weight Bearing Restrictions: No      Mobility Bed Mobility Overal bed mobility: Needs Assistance Bed Mobility: Supine to Sit;Sit to Supine     Supine to sit: Min guard Sit to supine: Min guard   General bed mobility comments: Increased time.   Transfers Overall transfer level: Needs assistance Equipment used: Rolling walker (2 wheeled) Transfers: Sit to/from [facility] Sit to Stand: Min guard;Mod assist         General transfer comment: close guard for safety         ADL Overall ADL's : Needs assistance/impaired     Grooming: Set up;Sitting   Upper Body Bathing: Set up;Sitting   Lower Body Bathing: Moderate assistance;Sit to/from stand   Upper Body Dressing : Minimal assistance;Sitting   Lower Body Dressing: Sit to/from stand;Moderate assistance   Toilet Transfer: Moderate assistance;BSC   Toileting- Clothing Manipulation and Hygiene: Maximal assistance;Sit to/from stand                         Pertinent Vitals/Pain Pain Assessment: No/denies pain      Hand Dominance     Extremity/Trunk Assessment         Cervical / Trunk Assessment Cervical / Trunk Assessment: Kyphotic   Communication Communication Communication: No difficulties   Cognition Arousal/Alertness: Awake/alert Behavior During Therapy: Flat affect Overall Cognitive Status: Impaired/Different from baseline                 General Comments: pt wears depends               Home Living Family/patient expects to be discharged to:: Private residence Living Arrangements: Spouse/significant other   Type of Home: Apartment       Home Layout: One level     Bathroom Shower/Tub: Teacher, early years/pre: Standard     Home Equipment: Environmental consultant - 2 wheels          Prior Functioning/Environment Level of Independence: Needs assistance  Gait / Transfers Assistance Needed: uses RW ADL's / Homemaking Assistance Needed: spouse states she requires assist for ADLs sometimes, "does what she can"        OT Diagnosis: Generalized weakness   OT Problem List: Decreased strength;Decreased activity tolerance;Impaired balance (sitting and/or standing);Decreased cognition;Decreased safety awareness;Decreased knowledge of use of DME or AE;Pain   OT Treatment/Interventions: Self-care/ADL training;DME and/or AE instruction;Therapeutic activities;Patient/family education;Balance training;Cognitive remediation/compensation    OT Goals(Current goals can be found in the care plan section) Acute Rehab OT Goals Patient Stated Goal: home tomorrow OT Goal Formulation: With patient ADL Goals Pt Will Perform Grooming: with  modified independence;standing Pt Will Perform Lower Body Dressing: with supervision;sit to/from stand Pt Will Transfer to Toilet: with supervision;bedside commode;regular height toilet Pt Will Perform Toileting - Clothing Manipulation and hygiene: with min assist;sit to/from stand  OT Frequency: Min 2X/week              End of Session  Nurse Communication:  (chair alarm in bed)  Activity Tolerance: Patient limited by fatigue Patient left: in bed;with call bell/phone within reach (chair alarm in bed; could not reset bed alarm)   Time: 1420-1433 OT Time Calculation (min): 13 min Charges:  OT General Charges $OT Visit: 1 Procedure OT Evaluation $Initial OT Evaluation Tier I: 1 Procedure G-Codes: OT G-codes **NOT FOR INPATIENT CLASS** Functional Assessment Tool Used: clinical observation Functional Limitation: Self care Self Care Current Status [id number]): At least 60 percent but less than 80 percent impaired, limited or restricted Self Care Goal Status [id number]): At least 1 percent but less than 20 percent impaired, limited or restricted  [place], Thereasa Parkin 08/04/2015, 2:37 PM

## 2015-08-04 NOTE — Progress Notes (Signed)
PT TX NOTE  08/04/15 1600  PT Visit Information  Last PT Received On 08/04/15  Assistance Needed +1  History of Present Illness 74 yo female admitted with syncope, 2nd degree AV block, orthostasis. Just discharged from [facility] on 11/19 before having to be readmitted again. Hx of HTN, generalized weakness, CKD.   PT Time Calculation  PT Start Time (ACUTE ONLY) 1544  PT Stop Time (ACUTE ONLY) 1558  PT Time Calculation (min) (ACUTE ONLY) 14 min  Subjective Data  Subjective Pt is in recliner, states she is tired and sleepy, but agrees to PT session prior to going to bed.  Patient Stated Goal home tomorrow  Precautions  Precautions Fall  Restrictions  Weight Bearing Restrictions No  Pain Assessment  Pain Assessment No/denies pain  Cognition  Arousal/Alertness Awake/alert  Behavior During Therapy Flat affect  Overall Cognitive Status Difficult to assess (d/t minimal communication)  Bed Mobility  Overal bed mobility Needs Assistance  Bed Mobility Sit to Supine  Sit to supine Min assist  General bed mobility comments Pt in recliner on arrival. Requires min assist with both LEs sit to supine, multiple verbal cues to scoot hips to midline  Transfers  Overall transfer level Needs assistance  Equipment used Rolling walker (2 wheeled)  Transfers Sit to/from Stand  Sit to Stand Min guard  General transfer comment min guard for safety; attempted to measure orthostatic hypotension, but pt couldn't tolerate standing and had to sit down, stated that if she stands up she needs to walk, sitting BP 123/68. She denied dizziness in standing.    Ambulation/Gait  Ambulation/Gait assistance Min guard  Ambulation Distance (Feet) 60 Feet  Assistive device Rolling walker (2 wheeled)  Gait Pattern/deviations Step-through pattern;Decreased stride length  General Gait Details multiple cues for re-directing RW to go in straight line, VC's for staying close to RW,fatigued quickly, turned  around after 30 feet  PT - End of Session  Equipment Utilized During Treatment Gait belt  Activity Tolerance Patient limited by fatigue  Patient left in bed;with call bell/phone within reach;with bed alarm set;with family/visitor present  Nurse Communication Mobility status  PT - Assessment/Plan  PT Plan Current plan remains appropriate  PT Frequency (ACUTE ONLY) Min 3X/week  Follow Up Recommendations Home health PT;Supervision/Assistance - 24 hour  PT equipment None recommended by PT  PT Goal Progression  Progress towards PT goals Progressing toward goals  Acute Rehab PT Goals  PT Goal Formulation With patient  Time For Goal Achievement 08/17/15  Potential to Achieve Goals Good  PT General Charges  $$ ACUTE PT VISIT 1 Procedure  PT Treatments  $Gait Training 8-22 mins  Ileana Roup, SPT 08/04/2015

## 2015-08-05 ENCOUNTER — Telehealth: Payer: Self-pay

## 2015-08-05 DIAGNOSIS — I10 Essential (primary) hypertension: Secondary | ICD-10-CM | POA: Diagnosis not present

## 2015-08-05 DIAGNOSIS — R55 Syncope and collapse: Secondary | ICD-10-CM | POA: Diagnosis not present

## 2015-08-05 DIAGNOSIS — N183 Chronic kidney disease, stage 3 (moderate): Secondary | ICD-10-CM | POA: Diagnosis not present

## 2015-08-05 DIAGNOSIS — R001 Bradycardia, unspecified: Secondary | ICD-10-CM | POA: Diagnosis not present

## 2015-08-05 LAB — MAGNESIUM: MAGNESIUM: 2.2 mg/dL (ref 1.7–2.4)

## 2015-08-05 LAB — BASIC METABOLIC PANEL
Anion gap: 10 (ref 5–15)
BUN: 28 mg/dL — AB (ref 6–20)
CALCIUM: 9.4 mg/dL (ref 8.9–10.3)
CHLORIDE: 100 mmol/L — AB (ref 101–111)
CO2: 28 mmol/L (ref 22–32)
CREATININE: 1.13 mg/dL — AB (ref 0.44–1.00)
GFR, EST AFRICAN AMERICAN: 54 mL/min — AB (ref >60)
GFR, EST NON AFRICAN AMERICAN: 47 mL/min — AB (ref >60)
Glucose, Bld: 93 mg/dL (ref 65–99)
Potassium: 4 mmol/L (ref 3.5–5.1)
SODIUM: 138 mmol/L (ref 135–145)

## 2015-08-05 NOTE — Progress Notes (Signed)
    Subjective: No complaints  Objective: Vital signs in last 24 hours: Temp:  [98.3 F (36.8 C)-99.4 F (37.4 C)] 98.3 F (36.8 C) (11/22 0444) Pulse Rate:  [60-70] 62 (11/22 0444) Resp:  [18-20] 18 (11/22 0444) BP: (102-134)/(52-71) 128/67 mmHg (11/22 0444) SpO2:  [97 %-100 %] 100 % (11/22 0444) Last BM Date: 08/02/15  Intake/Output from previous day: 11/21 0701 - 11/22 0700 In: 540 [P.O.:540] Out: -  Intake/Output this shift:    Medications Scheduled Meds: . antiseptic oral rinse  7 mL Mouth Rinse BID  . aspirin EC  81 mg Oral Daily  . atorvastatin  10 mg Oral q1800  . cefUROXime  250 mg Oral BID WC  . enoxaparin (LOVENOX) injection  30 mg Subcutaneous Q24H  . feeding supplement (ENSURE ENLIVE)  237 mL Oral BID BM  . ferrous sulfate  325 mg Oral Q breakfast  . polyethylene glycol  17 g Oral BID  . senna-docusate  1 tablet Oral QHS  . sodium chloride  3 mL Intravenous Q12H   Continuous Infusions:  PRN Meds:.acetaminophen **OR** acetaminophen, alum & mag hydroxide-simeth, LORazepam, ondansetron **OR** ondansetron (ZOFRAN) IV, oxyCODONE  PE: General appearance: alert, cooperative and Appears very weak.  Lungs: clear to auscultation bilaterally Heart: regular rate and rhythm, S1, S2 normal, no murmur, click, rub or gallop Extremities: No LEE Pulses: 2+ and symmetric Skin: Warm and dry Neurologic: Grossly normal  Lab Results:   Recent Labs  08/02/15 2038 08/03/15 0535 08/04/15 0541  WBC 11.2* 12.7* 8.5  HGB 8.8* 8.4* 8.2*  HCT 27.6* 26.7* 25.9*  PLT 695* 634* 574*   BMET  Recent Labs  08/03/15 0535 08/04/15 0541 08/05/15 0504  NA 141 140 138  K 3.6 3.8 4.0  CL 106 105 100*  CO2 26 28 28   GLUCOSE 97 89 93  BUN 22* 19 28*  CREATININE 1.17* 1.11* 1.13*  CALCIUM 9.0 9.0 9.4      Assessment/Plan  Syncope Still having frequent non-conducted PACs leading to HR in the 30's.  At this point, with no plan to consider PPM per the patient's  request, I don't think outpatient cardiac monitoring is needed.  Echo:  EF 60-65%, mild LVH, no WMA, G1DDMild AI, LA mildly dilated, PA pressure 23mmHg.  She will certainly need 24 hour supervision.    Chronic venous stasis dermatitis of both lower extremities  Hypokalemia: resolved  Protein-calorie malnutrition, severe (HCC)  Coronary artery disease  Essential hypertension Controlled  Constipation  CKD (chronic kidney disease) stage 3, GFR 30-59 ml/min: stable  AKI (acute kidney injury) ([place])  AV block, 2nd degree  Orthostasis       HAGER, BRYAN PA-C 08/05/2015 7:18 AM AS above patient seen and examined; no chest pain or dyspnea; no dizziness; occasional nonconducted pac and junctional escape beat on telemetry but no prolonged pauses. Not clear PM indicated (would need to correlate symptoms with bradycardia); regardless patient states she would never agree to pacemaker; would continue off coreg; LV function normal; no need for cardiology fu. Please call with questions. Ashlee Mack

## 2015-08-05 NOTE — Progress Notes (Signed)
[facility] PROGRESS NOTE  Ashlee Mack [id number] DOB: 15-Nov-1940 DOA: 08/02/2015 PCP: Lance Bosch, NP  Assessment/Plan: #1 syncope Likely cardiogenic in nature in addition to orthostasis. Patient is noted to have bradycardia with nonconducted PACs and intermittent junctional beats per cardiology. These are still noted on telemetry. Patient still having frequent nonconducted PACs leading to heart rate in the 30s. Orthostasis have improved. Cardiac enzymes negative 3. TSH within normal limits. 2-D echo with EF of 60-65% with no wall motion abnormalities, grade 1 diastolic dysfunction, mild aortic insufficiency, left atrium mildly dilated. CT head with area of hypoattenuation within the brainstem with which acute infarct cannot be excluded. 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 Brain parenchymal volume loss and microvascular changes. MRI of brain negative for any acute abnormalities. Moderate chronic small vessel ischemic disease with chronic lacunar infarcts and chronic microhemorrhages. Coreg and Norvasc have been discontinued. No further syncopal episodes. As patient has no plans for pacemaker placement no need for outpatient event monitor. Cardiology following.   #2 bradycardia Patient noted to be bradycardic on telemetry initially was felt patient had missed beats however, it was felt per cardiology that patient had frequent nonconducted PACs and intermittent junctional beats. Patient noted to still have frequent nonconducted PACs leading to heart rate in the 30s. Cardiac enzymes were negative 3. TSH within normal limits. 2-D echo with EF of 60-65% with no wall motion abnormalities, grade 1 diastolic dysfunction, mild aortic insufficiency, left atrium mildly dilated, PA pressure 27 mmHg. Patient will need 24-hour supervision. As patient refusing pacemaker placement no need for outpatient event monitor per cardiology.  Norvasc and Coreg have been  discontinued. Will continue to monitor on telemetry. Per cardiology.  #3 orthostasis Discontinued and Norvasc and Coreg. Patient currently asymptomatic. Orthostasis has improved. Continue gentle hydration. TED hose. Follow.  #4 hypertension Stable. Norvasc and Coreg have been discontinued. Blood pressure currently stable. If further blood pressure control is needed okay to resume Norvasc per cardiology.   #5 recent UTI Continue oral Ceftin D3/5.  #6 coronary artery disease Stable. Patient noted to have significant bradycardia with frequent nonconducted PACs and intermittent junctional beats. Coreg has been discontinued. Cardiac enzymes negative 3. TSH within normal limits. 2-D echo with EF of 60-65% with no wall motion abnormalities. Continue aspirin and statin. Cardiology following.  #7 malnutrition Nutritional supplementation.  #8 chronic kidney disease stage III Stable.  #9 chronic anemia Stable.  #10 hyperlipidemia Continue statin.  #11 prophylaxis Lovenox for DVT prophylaxis.  Code Status: DO NOT RESUSCITATE Family Communication: Updated patient and husband at bedside. Disposition Plan: Home with home health once etiology of syncope and management has been achieved, and frequent nonconducted PACs with heart rate in the 30s have improved significantly or resolved,and orthostasis has resolved.   Consultants:  Cardiology: Dr. Stanford Breed 08/03/2015  Procedures:  CT head 08/03/2015  MRI head 08/04/2015  2-D echo 08/04/2015  Antibiotics:  Oral Ceftin 08/03/2015  HPI/Subjective: Patient with no complaints. No further syncopal episodes. Denies any dizziness.Patient still with pauses noted on telemetry.  Objective: Filed Vitals:   08/05/15 0202 08/05/15 0444  BP: 102/55 128/67  Pulse: 60 62  Temp: 98.4 F (36.9 C) 98.3 F (36.8 C)  Resp: 18 18    Intake/Output Summary (Last 24 hours) at 08/05/15 1243 Last data filed at 08/04/15 1800  Gross per 24 hour   Intake    120 ml  Output      0 ml  Net    120 ml  Filed Weights   08/02/15 2353  Weight: 46.494 kg (102 lb 8 oz)    Exam:   General:  NAD  Cardiovascular: Bradycardia  Respiratory: CTAB  Abdomen: Soft, nontender, nondistended, positive bowel sounds.  Musculoskeletal: No clubbing /cyanosis or edema.  Data Reviewed: Basic Metabolic Panel:  Recent Labs Lab 07/31/15 0415  08/02/15 0533 08/02/15 2038 08/03/15 0535 08/04/15 0541 08/05/15 0504  NA  --   < > 140 137 141 140 138  K  --   < > 3.6 3.2* 3.6 3.8 4.0  CL  --   < > 101 99* 106 105 100*  CO2  --   < > 27 26 26 28 28   GLUCOSE  --   < > 103* 129* 97 89 93  BUN  --   < > 25* 27* 22* 19 28*  CREATININE  --   < > 1.30* 1.26* 1.17* 1.11* 1.13*  CALCIUM  --   < > 9.0 9.3 9.0 9.0 9.4  MG 2.4  --   --   --   --  2.2 2.2  < > = values in this interval not displayed. Liver Function Tests:  Recent Labs Lab 07/30/15 1533 07/31/15 0115  AST 48* 42*  ALT 37 32  ALKPHOS 61 54  BILITOT 0.7 0.3  PROT 9.0* 7.9  ALBUMIN 4.2 3.6    Recent Labs Lab 07/30/15 1533 07/31/15 0415  LIPASE 56* 46   No results for input(s): AMMONIA in the last 168 hours. CBC:  Recent Labs Lab 07/31/15 0115 08/01/15 0509 08/02/15 2038 08/03/15 0535 08/04/15 0541  WBC 9.8 9.3 11.2* 12.7* 8.5  HGB 8.6* 8.3* 8.8* 8.4* 8.2*  HCT 27.1* 26.8* 27.6* 26.7* 25.9*  MCV 75.3* 75.3* 73.6* 74.8* 75.1*  PLT 585* 594* 695* 634* 574*   Cardiac Enzymes:  Recent Labs Lab 07/31/15 0115 08/03/15 1028 08/03/15 1645 08/03/15 2143  CKTOTAL 37*  --   --   --   TROPONINI <0.03 <0.03 <0.03 <0.03   BNP (last 3 results)  Recent Labs  02/09/15 2017  BNP 79.5    ProBNP (last 3 results) No results for input(s): PROBNP in the last 8760 hours.  CBG:  Recent Labs Lab 07/30/15 1604 08/02/15 2042  GLUCAP 113* 132*    Recent Results (from the past 240 hour(s))  Culture, Urine     Status: None   Collection Time: 07/31/15 10:55 AM   Result Value Ref Range Status   Specimen Description URINE, CLEAN CATCH  Final   Special Requests NONE  Final   Culture   Final    NO GROWTH 1 DAY Performed at [facility]    Report Status 08/01/2015 FINAL  Final     Studies: Mr Brain Wo Contrast  08/04/2015  CLINICAL DATA:  Syncope. EXAM: MRI HEAD WITHOUT CONTRAST TECHNIQUE: Multiplanar, multiecho pulse sequences of the brain and surrounding structures were obtained without intravenous contrast. COMPARISON:  Head CT 08/03/2015 FINDINGS: There is no evidence of acute infarct, mass, midline shift, or extra-axial fluid collection. Foci of chronic microhemorrhage are present in the brainstem and deep gray nuclei bilaterally, likely reflecting sequelae of chronic hypertension. There is moderate cerebral atrophy. Numerous chronic lacunar infarcts are present in the deep gray nuclei and deep cerebral white matter bilaterally as well as in the brainstem. Patchy T2 hyperintensities in the cerebral white matter are nonspecific but compatible with moderate chronic small vessel ischemic disease. Orbits are unremarkable. Paranasal sinuses and mastoid air cells are clear.  Major intracranial vascular flow voids are preserved. IMPRESSION: 1. No acute intracranial abnormality. 2. 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 Electronically Signed   By: Logan Bores M.D.   On: 08/04/2015 11:12    Scheduled Meds: . antiseptic oral rinse  7 mL Mouth Rinse BID  . aspirin EC  81 mg Oral Daily  . atorvastatin  10 mg Oral q1800  . cefUROXime  250 mg Oral BID WC  . enoxaparin (LOVENOX) injection  30 mg Subcutaneous Q24H  . feeding supplement (ENSURE ENLIVE)  237 mL Oral BID BM  . ferrous sulfate  325 mg Oral Q breakfast  . polyethylene glycol  17 g Oral BID  . senna-docusate  1 tablet Oral QHS  . sodium chloride  3 mL Intravenous Q12H   Continuous Infusions:    Principal  Problem:   Syncope Active Problems:   Bradycardia   Orthostasis   Chronic venous stasis dermatitis of both lower extremities   Hypokalemia   Protein-calorie malnutrition, severe (HCC)   Coronary artery disease   Essential hypertension   Constipation   CKD (chronic kidney disease) stage 3, GFR 30-59 ml/min   AKI (acute kidney injury) ([place])    Time spent: [facility] Pager [phone]. If 7PM-7AM, please contact night-coverage at [email or web address], password [facility] 08/05/2015, 12:43 PM

## 2015-08-05 NOTE — Telephone Encounter (Signed)
Met with the patient at the hospital today. She was quite tired and stated that her family would be coming to the hospital later today.  She was not aware of any discharge plan at the time.  This CM then spoke to Purcell Mouton, RN CM who noted that she was not aware of the discharge disposition yet.   Call then placed to the patient's son, Ashlee Mack, who stated that he was on his way to the hospital. He said that he was not sure of a discharge plan yet but he might know more later this afternoon/eveing. He was agreeable to having CM call him tomorrow to discuss the plan.

## 2015-08-05 NOTE — Progress Notes (Signed)
Contacted on-call provider Schorr,  To make aware that the patient's  heart rate went as low as 39-40. Pt hr rate is at 55, and remains asymptomatic and is asleep.

## 2015-08-06 ENCOUNTER — Telehealth: Payer: Self-pay

## 2015-08-06 DIAGNOSIS — R55 Syncope and collapse: Secondary | ICD-10-CM | POA: Diagnosis not present

## 2015-08-06 DIAGNOSIS — N179 Acute kidney failure, unspecified: Secondary | ICD-10-CM | POA: Diagnosis not present

## 2015-08-06 DIAGNOSIS — R001 Bradycardia, unspecified: Secondary | ICD-10-CM | POA: Diagnosis not present

## 2015-08-06 LAB — BASIC METABOLIC PANEL
Anion gap: 9 (ref 5–15)
BUN: 29 mg/dL — AB (ref 6–20)
CALCIUM: 9.4 mg/dL (ref 8.9–10.3)
CO2: 28 mmol/L (ref 22–32)
Chloride: 101 mmol/L (ref 101–111)
Creatinine, Ser: 1.23 mg/dL — ABNORMAL HIGH (ref 0.44–1.00)
GFR calc Af Amer: 49 mL/min — ABNORMAL LOW (ref >60)
GFR, EST NON AFRICAN AMERICAN: 42 mL/min — AB (ref >60)
GLUCOSE: 102 mg/dL — AB (ref 65–99)
Potassium: 4.4 mmol/L (ref 3.5–5.1)
Sodium: 138 mmol/L (ref 135–145)

## 2015-08-06 LAB — MAGNESIUM: Magnesium: 2 mg/dL (ref 1.7–2.4)

## 2015-08-06 NOTE — Discharge Instructions (Signed)
Near-Syncope Near-syncope (commonly known as near fainting) is sudden weakness, dizziness, or feeling like you might pass out. During an episode of near-syncope, you may also develop pale skin, have tunnel vision, or feel sick to your stomach (nauseous). Near-syncope may occur when getting up after sitting or while standing for a long time. It is caused by a sudden decrease in blood flow to the brain. This decrease can result from various causes or triggers, most of which are not serious. However, because near-syncope can sometimes be a sign of something serious, a medical evaluation is required. The specific cause is often not determined. HOME CARE INSTRUCTIONS  Monitor your condition for any changes. The following actions may help to alleviate any discomfort you are experiencing:  Have someone stay with you until you feel stable.  Lie down right away and prop your feet up if you start feeling like you might faint. Breathe deeply and steadily. Wait until all the symptoms have passed. Most of these episodes last only a few minutes. You may feel tired for several hours.   Drink enough fluids to keep your urine clear or pale yellow.   If you are taking blood pressure or heart medicine, get up slowly when seated or lying down. Take several minutes to sit and then stand. This can reduce dizziness.  Follow up with your health care provider as directed. SEEK IMMEDIATE MEDICAL CARE IF:   You have a severe headache.   You have unusual pain in the chest, abdomen, or back.   You are bleeding from the mouth or rectum, or you have black or tarry stool.   You have an irregular or very fast heartbeat.   You have repeated fainting or have seizure-like jerking during an episode.   You faint when sitting or lying down.   You have confusion.   You have difficulty walking.   You have severe weakness.   You have vision problems.  MAKE SURE YOU:   Understand these instructions.  Will  watch your condition.  Will get help right away if you are not doing well or get worse.   This information is not intended to replace advice given to you by your health care provider. Make sure you discuss any questions you have with your health care provider.   Document Released: 08/30/2005 Document Revised: 09/04/2013 Document Reviewed: 02/02/2013 Elsevier Interactive Patient Education 2016 Elsevier Inc[employer] Bradycardia Bradycardia is a slower-than-normal heart rate. A normal resting heart rate for an adult ranges from 60 to 100 beats per minute. With bradycardia, the resting heart rate is less than 60 beats per minute. Bradycardia is a problem if your heart cannot pump enough oxygen-rich blood through your body. Bradycardia is not a problem for everyone. For some healthy adults, a slow resting heart rate is normal.  CAUSES  Bradycardia may be caused by:  A problem with the heart's electrical system, such as heart block.  A problem with the heart's natural pacemaker (sinus node).  Heart disease, damage, or infection.  Certain medicines that treat heart conditions.  Certain conditions, such as hypothyroidism and obstructive sleep apnea. RISK FACTORS  Risk factors include:  Being 79 or older.  Having high blood pressure (hypertension), high cholesterol (hyperlipidemia), or diabetes.  Drinking heavily, using tobacco products, or using drugs.  Being stressed. SIGNS AND SYMPTOMS  Signs and symptoms include:  Light-headedness.  Faintingor near fainting.  Fatigue and weakness.  Shortness of breath.  Chest pain (angina).  Drowsiness.  Confusion.  Dizziness. DIAGNOSIS  Diagnosis of bradycardia may include:  A physical exam.  An electrocardiogram (ECG).  Blood tests. TREATMENT  Treatment for bradycardia may include:  Treatment of an underlying condition.  Pacemaker placement. A pacemaker is a small, battery-powered device that is placed under the skin and is  programmed to sense your heartbeats. If your heart rate is lower than the programmed rate, the pacemaker will pace your heart.  Changing your medicines or dosages. HOME CARE INSTRUCTIONS  Take medicines only as directed by your health care provider.  Manage any health conditions that contribute to bradycardia as directed by your health care provider.  Follow a heart-healthy diet. A dietitian can help educate you on healthy food options and changes.  Follow an exercise program approved by your health care provider.  Maintain a healthy weight. Lose weight as approved by your health care provider.  Do not use tobacco products, including cigarettes, chewing tobacco, or electronic cigarettes. If you need help quitting, ask your health care provider.  Do not use illegal drugs.  Limit alcohol intake to no more than 1 drink per day for nonpregnant women and 2 drinks per day for men. One drink equals 12 ounces of beer, 5 ounces of wine, or 1 ounces of hard liquor.  Keep all follow-up visits as directed by your health care provider. This is important. SEEK MEDICAL CARE IF:  You feel light-headed or dizzy.  You almost faint.  You feel weak or are easily fatigued during physical activity.  You experience confusion or have memory problems. SEEK IMMEDIATE MEDICAL CARE IF:   You faint.  You have an irregular heartbeat.  You have chest pain.  You have trouble breathing. MAKE SURE YOU:   Understand these instructions.  Will watch your condition.  Will get help right away if you are not doing well or get worse.   This information is not intended to replace advice given to you by your health care provider. Make sure you discuss any questions you have with your health care provider.   Document Released: 05/22/2002 Document Revised: 09/20/2014 Document Reviewed: 12/05/2013 Elsevier Interactive Patient Education [employer].

## 2015-08-06 NOTE — Progress Notes (Signed)
PT Cancellation Note  Patient Details Name: Ashlee Mack MRN: [id number] DOB: 12-Jun-1941   Cancelled Treatment:    Reason Eval/Treat Not Completed: Other (comment) (Pt declined PT, stated she's going home soon. )   [name] 08/06/2015, 1:50 PM [phone]

## 2015-08-06 NOTE — Discharge Summary (Signed)
Physician Discharge Summary  Ashlee Mack [id number] DOB: May 05, 1941 DOA: 08/02/2015  PCP: Lance Bosch, NP  Admit date: 08/02/2015 Discharge date: 08/06/2015  Recommendations for Outpatient Follow-up:  1. Pt will need to follow up with PCP in 2-3 weeks post discharge 2. Please obtain BMP to evaluate electrolytes and kidney function 3. Please also check CBC to evaluate Hg and Hct levels  Discharge Diagnoses:  Principal Problem:   Syncope Active Problems:   Chronic venous stasis dermatitis of both lower extremities   Hypokalemia   Protein-calorie malnutrition, severe (HCC)   Coronary artery disease   Essential hypertension   Constipation   CKD (chronic kidney disease) stage 3, GFR 30-59 ml/min   AKI (acute kidney injury) ([place])   Bradycardia   Orthostasis  Discharge Condition: Stable  Diet recommendation: Heart healthy diet discussed in details   History of present illness:  74 y.o. female with a history of CAD, HTN, Stage III CKD who presents to the ED after suffering a syncopal event in her home. She was witnessed as passing out when she went to the bathroom and she was passed out for about 3 minutes. She was brought to the ED for further evaluation.   Assessment and plan: #1 syncope - Likely cardiogenic in nature in addition to orthostasis. Patient is noted to have bradycardia with nonconducted PACs and intermittent junctional beats - Cardiac enzymes negative 3. 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  - coreg stopped due to bradycardia   #2 bradycardia - pt refused pacemaker - coreg stopped and HR is now in 60's  #3 orthostasis - ambulating in hallway, orthostatic vitals stable   #4 hypertension - continue Norvasc    #5 recent UTI - Continue oral Ceftin D3/5.  #6  coronary artery disease - outpatient follow up with cardiology   #7 malnutrition, moderate  - Nutritional supplementation.  #8 chronic kidney disease stage III Stable.  #9 chronic anemia - Stable.  #10 hyperlipidemia - Continue statin.  #11 prophylaxis Lovenox for DVT prophylaxis.   Consultants:  Cardiology: Dr. Stanford Breed 08/03/2015  Procedures:  CT head 08/03/2015  MRI head 08/04/2015  2-D echo 08/04/2015  Antibiotics:  Oral Ceftin 08/03/2015  Procedures/Studies: Ct Head Wo Contrast  08/03/2015  CLINICAL DATA:  Syncopal episode at home. History of chronic kidney disease. EXAM: CT HEAD WITHOUT CONTRAST TECHNIQUE: Contiguous axial images were obtained from the base of the skull through the vertex without intravenous contrast. COMPARISON:  03/24/2014 FINDINGS: No mass effect or midline shift. No evidence of acute intracranial hemorrhage. There are brain parenchymal atrophy and deep white matter microvascular changes. 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 There is hypoattenuated appearance of the brainstem at the interface of pons with medulla oblongata. No abnormal extra-axial fluid collections. Basal cisterns are preserved. No depressed skull fractures. Visualized paranasal sinuses and mastoid air cells are not opacified. IMPRESSION: Area of hypoattenuation within the brain stem, with which acute infarct cannot be excluded. 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 Brain parenchymal volume loss and microvascular changes. These results were called by telephone at the time of interpretation on 08/03/2015 at 10:39 am to Dr. Irine Seal , who verbally acknowledged these results. Electronically Signed   By: Fidela Salisbury M.D.   On: 08/03/2015 10:41  Mr Brain Wo Contrast  08/04/2015  CLINICAL DATA:  Syncope. EXAM: MRI HEAD WITHOUT CONTRAST TECHNIQUE: Multiplanar, multiecho pulse  sequences of the brain and surrounding structures were obtained without intravenous contrast. COMPARISON:  Head CT 08/03/2015 FINDINGS: There is no evidence of acute infarct, mass, midline shift, or extra-axial fluid collection. Foci of chronic microhemorrhage are present in the brainstem and deep gray nuclei bilaterally, likely reflecting sequelae of chronic hypertension. There is moderate cerebral atrophy. Numerous chronic lacunar infarcts are present in the deep gray nuclei and deep cerebral white matter bilaterally as well as in the brainstem. Patchy T2 hyperintensities in the cerebral white matter are nonspecific but compatible with moderate chronic small vessel ischemic disease. Orbits are unremarkable. Paranasal sinuses and mastoid air cells are clear. Major intracranial vascular flow voids are preserved. IMPRESSION: 1. No acute intracranial abnormality. 2. 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 Electronically Signed   By: Logan Bores M.D.   On: 08/04/2015 11:12   Nm Hepatobiliary Liver Func  08/01/2015  CLINICAL DATA:  Generalized weakness, epigastric pain for 1 week EXAM: NUCLEAR MEDICINE HEPATOBILIARY IMAGING TECHNIQUE: Sequential images of the abdomen were obtained out to 60 minutes following intravenous administration of radiopharmaceutical. RADIOPHARMACEUTICALS:  5.5 mCi Tc-43m  Choletec IV COMPARISON:  CT scan 07/30/2015 FINDINGS: There is normal uptake of the tracer by the liver. Gallbladder visualized at 35 minutes. CBD visualized at 41 minutes. Bowel activity noted at 60 minutes. IMPRESSION: 1. No cystic duct obstruction.  No CBD obstruction. Electronically Signed   By: Lahoma Crocker M.D.   On: 08/01/2015 12:04   Ct Abdomen Pelvis W Contrast  07/30/2015  CLINICAL DATA:  Lower abdominal pain, worsening weakness. EXAM: CT ABDOMEN AND PELVIS WITH CONTRAST TECHNIQUE: Multidetector CT imaging of the abdomen and pelvis was  performed using the standard protocol following bolus administration of intravenous contrast. CONTRAST:  60mL OMNIPAQUE IOHEXOL 300 MG/ML SOLN, 19mL OMNIPAQUE IOHEXOL 300 MG/ML SOLN COMPARISON:  02/09/2015 unenhanced CT abdomen/pelvis. FINDINGS: Lower chest: No significant pulmonary nodules or acute consolidative airspace disease. Left anterior descending coronary atherosclerosis. Hepatobiliary: Simple 0.8 cm right liver lobe cyst. At least 4 additional subcentimeter hypodense liver lesions, too small to characterize. There is a 1.0 cm peripherally calcified gallstone layering in the nondistended gallbladder, with no gallbladder wall thickening or pericholecystic fat stranding. Stable mild diffuse intrahepatic biliary ductal dilatation. Stable mildly dilated common bile duct (7 mm diameter), with smooth distal tapering and no radiopaque choledocholithiasis. Pancreas: Normal, with no mass or duct dilation. Spleen: Normal size. No mass. Adrenals/Urinary Tract: Normal adrenals. There are small simple bilateral renal cysts, largest 1.1 cm in the lower right kidney and 1.6 cm in the lower left kidney. There are innumerable additional subcentimeter hypodense renal lesions throughout both kidneys, too small to characterize. There is stable mild fullness of the right renal collecting system without overt right hydronephrosis. There are small extrarenal pelves bilaterally, unchanged. No left hydronephrosis. Borderline mild diffuse bladder wall thickening. No focal bladder abnormality. Stomach/Bowel: Grossly normal stomach. Normal caliber small bowel with no small bowel wall thickening. Appendix is not discretely visualized. There is a large amount of stool throughout the colon, with no colonic wall thickening or colonic diverticulosis. The rectum is prominently distended by stool up to a diameter of 6.6 cm. There is no appreciable rectal wall thickening or pneumatosis. Vascular/Lymphatic: Normal caliber abdominal aorta.  Patent portal, splenic, hepatic and renal veins. No pathologically enlarged lymph nodes in the abdomen  or pelvis. Reproductive: The endometrial cavity is distended by gas, which is a chronic finding. Other: No pneumoperitoneum, ascites or focal fluid collection. Musculoskeletal: No aggressive appearing focal osseous lesions. Mild-to-moderate degenerative changes in the visualized thoracolumbar spine. Stable moderate dextroscoliosis of the thoracolumbar spine. IMPRESSION: 1. No evidence of bowel obstruction or acute bowel inflammation. 2. Prominent colorectal stool volume, suggesting constipation, with likely fecal impaction in the rectum. No evidence of stercoral colitis. 3. Borderline mild diffuse bladder wall thickening, recommend correlation with urinalysis to exclude acute cystitis. 4. Cholelithiasis. No evidence of acute cholecystitis. Stable mild chronic biliary ductal dilatation, with no radiopaque choledocholithiasis. Recommend correlation with serum bilirubin levels. 5. Chronic gaseous distention of the endometrial cavity, a finding of uncertain significance. There is no uterine hyperenhancement to suggest acute gynecologic infection. There is no evidence of a uterine fistula to the bowel. Electronically Signed   By: Ilona Sorrel M.D.   On: 07/30/2015 19:21   Dg Chest Port 1 View  07/31/2015  CLINICAL DATA:  Epigastric pain, weakness, diarrhea, hypertension, coronary artery disease EXAM: PORTABLE CHEST 1 VIEW COMPARISON:  Portable exam 0639 hours compared to 02/09/2015 FINDINGS: Upper normal heart size. Rotated to the LEFT. Mediastinal contours and pulmonary vascularity normal. Atherosclerotic calcification aorta. Lungs clear. Eventration RIGHT diaphragm again noted. No pleural effusion or pneumothorax. Bones demineralized. IMPRESSION: No acute abnormalities. Electronically Signed   By: Lavonia Dana M.D.   On: 07/31/2015 07:50   Discharge Exam: Filed Vitals:   08/05/15 2125 08/06/15 0644  BP: 120/62  130/70  Pulse: 64 68  Temp: 98.1 F (36.7 C) 98.1 F (36.7 C)  Resp: 18 18   Filed Vitals:   08/05/15 0202 08/05/15 0444 08/05/15 2125 08/06/15 0644  BP: 102/55 128/67 120/62 130/70  Pulse: 60 62 64 68  Temp: 98.4 F (36.9 C) 98.3 F (36.8 C) 98.1 F (36.7 C) 98.1 F (36.7 C)  TempSrc: Oral Oral Oral Oral  Resp: 18 18 18 18   Height:      Weight:      SpO2: 97% 100% 100% 100%    General: Pt is alert, follows commands appropriately, not in acute distress Cardiovascular: Regular rate and rhythm, S1/S2 +,  no rubs, no gallops Respiratory: Clear to auscultation bilaterally, no wheezing, no crackles, no rhonchi Abdominal: Soft, non tender, non distended, bowel sounds +, no guarding Extremities: no edema, no cyanosis, pulses palpable bilaterally DP and PT   Discharge Instructions  Discharge Instructions    Diet - low sodium heart healthy    Complete by:  As directed      Increase activity slowly    Complete by:  As directed             Medication List    STOP taking these medications        carvedilol 12.5 MG tablet  Commonly known as:  COREG      TAKE these medications        amLODipine 10 MG tablet  Commonly known as:  NORVASC  Take 1 tablet (10 mg total) by mouth daily.     aspirin EC 81 MG tablet  Take 1 tablet (81 mg total) by mouth daily.     atorvastatin 10 MG tablet  Commonly known as:  LIPITOR  Take 1 tablet (10 mg total) by mouth daily. For cholesterol     cefUROXime 250 MG tablet  Commonly known as:  CEFTIN  Take 1 tablet (250 mg total) by mouth 2 (two) times daily with  a meal. Take for 5 days then stop.     feeding supplement (ENSURE COMPLETE) Liqd  Take 237 mLs by mouth 2 (two) times daily between meals.     ferrous sulfate 325 (65 FE) MG tablet  Take 1 tablet (325 mg total) by mouth daily with breakfast.     polyethylene glycol packet  Commonly known as:  MIRALAX / GLYCOLAX  Take 17 g by mouth 2 (two) times daily. Hold if develops  diarrhea.     senna-docusate 8.6-50 MG tablet  Commonly known as:  Senokot-S  Take 1 tablet by mouth at bedtime.           Follow-up Information    Follow up with Lance Bosch, NP.   Specialty:  Internal Medicine   Contact information:   [place] [id number] [phone]        The results of significant diagnostics from this hospitalization (including imaging, microbiology, ancillary and laboratory) are listed below for reference.     Microbiology: Recent Results (from the past 240 hour(s))  Culture, Urine     Status: None   Collection Time: 07/31/15 10:55 AM  Result Value Ref Range Status   Specimen Description URINE, CLEAN CATCH  Final   Special Requests NONE  Final   Culture   Final    NO GROWTH 1 DAY Performed at [facility]    Report Status 08/01/2015 FINAL  Final     Labs: Basic Metabolic Panel:  Recent Labs Lab 07/31/15 0415  08/02/15 2038 08/03/15 0535 08/04/15 0541 08/05/15 0504 08/06/15 0457  NA  --   < > 137 141 140 138 138  K  --   < > 3.2* 3.6 3.8 4.0 4.4  CL  --   < > 99* 106 105 100* 101  CO2  --   < > 26 26 28 28 28   GLUCOSE  --   < > 129* 97 89 93 102*  BUN  --   < > 27* 22* 19 28* 29*  CREATININE  --   < > 1.26* 1.17* 1.11* 1.13* 1.23*  CALCIUM  --   < > 9.3 9.0 9.0 9.4 9.4  MG 2.4  --   --   --  2.2 2.2 2.0  < > = values in this interval not displayed. Liver Function Tests:  Recent Labs Lab 07/30/15 1533 07/31/15 0115  AST 48* 42*  ALT 37 32  ALKPHOS 61 54  BILITOT 0.7 0.3  PROT 9.0* 7.9  ALBUMIN 4.2 3.6    Recent Labs Lab 07/30/15 1533 07/31/15 0415  LIPASE 56* 46   CBC:  Recent Labs Lab 07/31/15 0115 08/01/15 0509 08/02/15 2038 08/03/15 0535 08/04/15 0541  WBC 9.8 9.3 11.2* 12.7* 8.5  HGB 8.6* 8.3* 8.8* 8.4* 8.2*  HCT 27.1* 26.8* 27.6* 26.7* 25.9*  MCV 75.3* 75.3* 73.6* 74.8* 75.1*  PLT 585* 594* 695* 634* 574*   Cardiac Enzymes:  Recent Labs Lab 07/31/15 0115  08/03/15 1028 08/03/15 1645 08/03/15 2143  CKTOTAL 37*  --   --   --   TROPONINI <0.03 <0.03 <0.03 <0.03   BNP: BNP (last 3 results)  Recent Labs  02/09/15 2017  BNP 79.5   CBG:  Recent Labs Lab 07/30/15 1604 08/02/15 2042  GLUCAP 113* 132*   SIGNED: Time coordinating discharge: 30 minutes  Faye Ramsay, MD  [facility] 08/06/2015, 9:38 AM Pager [phone]   If 7PM-7AM, please contact night-coverage [email or web address] Password [email or web address]

## 2015-08-06 NOTE — Progress Notes (Signed)
Patient and her husband given discharge, medication and f/u instructions, verbalized understanding, telemetry and IV removed, family to transport home

## 2015-08-06 NOTE — Hospital Discharge Follow-Up (Signed)
[facility] at [place]:  This Case Manager received phone call from Purcell Mouton, RN CM who indicated patient would be discharging home with home health services. This Case Manager placed call to patient's son, Charlestyn Viramontes (patient has indicated her son makes her decisions and he should be called) to determine if he is agreeable to his mother receiving post-discharge follow-up at the [facility] and [facility].  Informed patient's son once again of the services and medical management provided at the [facility], and patient's son agreeable to patient following with the [facility] after discharge. Appointment scheduled for 08/13/15 at 1530. Will update AVS. Attempted to complete [facility] assessment to determine any barriers; however, patient's son indicated he was at work and could not talk at this time. Will attempt to follow up with patient's son at a later time. Purcell Mouton, RN CM updated.

## 2015-08-06 NOTE — Telephone Encounter (Signed)
This Case Manager placed an additional call to patient's son to complete [facility] assessment; however, patient indicated he preferred if Case Manager followed up with him at a later time.  Patient has [facility] appointment scheduled for 08/13/15 at 1530 with Dr. Jarold Song.

## 2015-08-08 DIAGNOSIS — R159 Full incontinence of feces: Secondary | ICD-10-CM | POA: Diagnosis not present

## 2015-08-08 DIAGNOSIS — R32 Unspecified urinary incontinence: Secondary | ICD-10-CM | POA: Diagnosis not present

## 2015-08-08 DIAGNOSIS — I2510 Atherosclerotic heart disease of native coronary artery without angina pectoris: Secondary | ICD-10-CM | POA: Diagnosis not present

## 2015-08-08 DIAGNOSIS — I129 Hypertensive chronic kidney disease with stage 1 through stage 4 chronic kidney disease, or unspecified chronic kidney disease: Secondary | ICD-10-CM | POA: Diagnosis not present

## 2015-08-08 DIAGNOSIS — D649 Anemia, unspecified: Secondary | ICD-10-CM | POA: Diagnosis not present

## 2015-08-08 DIAGNOSIS — Z7982 Long term (current) use of aspirin: Secondary | ICD-10-CM | POA: Diagnosis not present

## 2015-08-08 DIAGNOSIS — I872 Venous insufficiency (chronic) (peripheral): Secondary | ICD-10-CM | POA: Diagnosis not present

## 2015-08-08 DIAGNOSIS — N390 Urinary tract infection, site not specified: Secondary | ICD-10-CM | POA: Diagnosis not present

## 2015-08-08 DIAGNOSIS — E785 Hyperlipidemia, unspecified: Secondary | ICD-10-CM | POA: Diagnosis not present

## 2015-08-08 DIAGNOSIS — K8070 Calculus of gallbladder and bile duct without cholecystitis without obstruction: Secondary | ICD-10-CM | POA: Diagnosis not present

## 2015-08-08 DIAGNOSIS — N183 Chronic kidney disease, stage 3 (moderate): Secondary | ICD-10-CM | POA: Diagnosis not present

## 2015-08-08 DIAGNOSIS — M6281 Muscle weakness (generalized): Secondary | ICD-10-CM | POA: Diagnosis not present

## 2015-08-08 DIAGNOSIS — E46 Unspecified protein-calorie malnutrition: Secondary | ICD-10-CM | POA: Diagnosis not present

## 2015-08-08 DIAGNOSIS — Z9181 History of falling: Secondary | ICD-10-CM | POA: Diagnosis not present

## 2015-08-11 ENCOUNTER — Telehealth: Payer: Self-pay

## 2015-08-11 DIAGNOSIS — I2510 Atherosclerotic heart disease of native coronary artery without angina pectoris: Secondary | ICD-10-CM | POA: Diagnosis not present

## 2015-08-11 DIAGNOSIS — M6281 Muscle weakness (generalized): Secondary | ICD-10-CM | POA: Diagnosis not present

## 2015-08-11 DIAGNOSIS — I129 Hypertensive chronic kidney disease with stage 1 through stage 4 chronic kidney disease, or unspecified chronic kidney disease: Secondary | ICD-10-CM | POA: Diagnosis not present

## 2015-08-11 DIAGNOSIS — N390 Urinary tract infection, site not specified: Secondary | ICD-10-CM | POA: Diagnosis not present

## 2015-08-11 DIAGNOSIS — E46 Unspecified protein-calorie malnutrition: Secondary | ICD-10-CM | POA: Diagnosis not present

## 2015-08-11 DIAGNOSIS — N183 Chronic kidney disease, stage 3 (moderate): Secondary | ICD-10-CM | POA: Diagnosis not present

## 2015-08-11 NOTE — Telephone Encounter (Signed)
[facility] Post-discharge Follow-Up Phone Call:  Date of Discharge:  08/06/2015 Principal Discharge Diagnosis(es):  Syncope, chronic venous stasis dermatitis, CAD, HTN, CKD.  Refused a PPM. Post-discharge Communication: Call placed to the patient's son, Ashlee Mack.  The patient had provided Carmela Hurt, RN CM approval for CM to contact Ashlee to discuss her medical needs.  Call Completed: Yes                   With Whom: Ashlee Mack- son Interpreter Needed: No     Please check all that apply:  ? Patient is knowledgeable of his/her condition(s) and/or treatment. ? Patient is caring for self at home.  X  Patient is receiving assist at home from family and/or caregiver. Family and/or caregiver is knowledgeable of patient's condition(s) and/or treatment.      As per Ashlee, the patient's husband has been providing all of the care/assistance that she needs.  He stated that his father giver her all of her medication and provides the assistance that she needs for personal care/ADLs.  Ashlee works during the day but stated that he is home with the patient at night.  X  Patient is receiving home health services. If so, name of agency.  [facility].  As per Ashlee, at this time, the patient has only received SN services. She said that the nurse came out Friday, 08/08/15 and today.       Medication Reconciliation:  ? Medication list reviewed with patient. X Patient obtained all discharge medications.  - Ashlee stated that the  patient has all of her medications and she has no difficulty affording the medications.  He said that he was at work and could not talk any longer. He noted that he does not have any questions about the medications at this time and will discuss any of his questions at the appointment on 08/13/15.  He noted that his father gives his mother all of her medications. Instructed him to bring all of the medications to the appointment this week and he stated that he  would. He reported no difficulty affording the medications.    Activities of Daily Living:  ? Independent X  Needs assist (describe; ? home DME used) - Ashlee noted that she has a walker and his father provides any needed assistance with ADLs. He stated that he has not spoken to the home care nurse about a home health aide yet  ? [facility] (describe, ? home DME used)   Community resources in place for patient:  ? None   X Home Health/Home DME - [facility] services have started. ? Assisted Living ? Support Group          Patient Education: Explained the services available at the [facility] including social work and financial counseling.          Questions/Concerns discussed: Jeannette Corpus said that the patient will be at her appointment on 08/13/15 and he will provide the transportation.  He noted that his mother is "doing  pretty good."  He stated that he had no other questions/concerns at this time.

## 2015-08-12 ENCOUNTER — Telehealth: Payer: Self-pay | Admitting: Internal Medicine

## 2015-08-12 ENCOUNTER — Telehealth: Payer: Self-pay

## 2015-08-12 DIAGNOSIS — E46 Unspecified protein-calorie malnutrition: Secondary | ICD-10-CM | POA: Diagnosis not present

## 2015-08-12 DIAGNOSIS — N183 Chronic kidney disease, stage 3 (moderate): Secondary | ICD-10-CM | POA: Diagnosis not present

## 2015-08-12 DIAGNOSIS — I2510 Atherosclerotic heart disease of native coronary artery without angina pectoris: Secondary | ICD-10-CM | POA: Diagnosis not present

## 2015-08-12 DIAGNOSIS — I129 Hypertensive chronic kidney disease with stage 1 through stage 4 chronic kidney disease, or unspecified chronic kidney disease: Secondary | ICD-10-CM | POA: Diagnosis not present

## 2015-08-12 DIAGNOSIS — N390 Urinary tract infection, site not specified: Secondary | ICD-10-CM | POA: Diagnosis not present

## 2015-08-12 DIAGNOSIS — M6281 Muscle weakness (generalized): Secondary | ICD-10-CM | POA: Diagnosis not present

## 2015-08-12 NOTE — Telephone Encounter (Signed)
Attempted to contact the patient's son, Jeannette Corpus, to remind him of his mother's appointment tomorrow, 08/13/15 @ 1530.  Call placed to  # [phone] and a HIPAA compliant voice mail message was left requesting a call back to # [phone] or [phone].

## 2015-08-12 NOTE — Telephone Encounter (Signed)
Nurse Freda Munro from [facility] called stating that the hospital put a medicine , cefUROXime (CEFTIN) on her med list and patient does not have the medcine. Please follow up.

## 2015-08-13 ENCOUNTER — Encounter: Payer: Self-pay | Admitting: Family Medicine

## 2015-08-13 ENCOUNTER — Ambulatory Visit: Payer: Medicare Other | Attending: Family Medicine | Admitting: Family Medicine

## 2015-08-13 VITALS — BP 128/76 | HR 83 | Temp 99.0°F | Resp 14 | Ht 62.0 in

## 2015-08-13 DIAGNOSIS — N183 Chronic kidney disease, stage 3 unspecified: Secondary | ICD-10-CM

## 2015-08-13 DIAGNOSIS — D509 Iron deficiency anemia, unspecified: Secondary | ICD-10-CM | POA: Diagnosis not present

## 2015-08-13 DIAGNOSIS — R001 Bradycardia, unspecified: Secondary | ICD-10-CM

## 2015-08-13 DIAGNOSIS — Z79899 Other long term (current) drug therapy: Secondary | ICD-10-CM | POA: Diagnosis not present

## 2015-08-13 DIAGNOSIS — R55 Syncope and collapse: Secondary | ICD-10-CM | POA: Insufficient documentation

## 2015-08-13 DIAGNOSIS — Z7982 Long term (current) use of aspirin: Secondary | ICD-10-CM | POA: Diagnosis not present

## 2015-08-13 DIAGNOSIS — I872 Venous insufficiency (chronic) (peripheral): Secondary | ICD-10-CM | POA: Insufficient documentation

## 2015-08-13 DIAGNOSIS — N390 Urinary tract infection, site not specified: Secondary | ICD-10-CM | POA: Diagnosis not present

## 2015-08-13 DIAGNOSIS — R4182 Altered mental status, unspecified: Secondary | ICD-10-CM | POA: Insufficient documentation

## 2015-08-13 DIAGNOSIS — I2510 Atherosclerotic heart disease of native coronary artery without angina pectoris: Secondary | ICD-10-CM | POA: Insufficient documentation

## 2015-08-13 DIAGNOSIS — E46 Unspecified protein-calorie malnutrition: Secondary | ICD-10-CM | POA: Diagnosis not present

## 2015-08-13 DIAGNOSIS — E785 Hyperlipidemia, unspecified: Secondary | ICD-10-CM | POA: Diagnosis not present

## 2015-08-13 DIAGNOSIS — M6281 Muscle weakness (generalized): Secondary | ICD-10-CM | POA: Diagnosis not present

## 2015-08-13 DIAGNOSIS — R7303 Prediabetes: Secondary | ICD-10-CM | POA: Diagnosis not present

## 2015-08-13 DIAGNOSIS — I129 Hypertensive chronic kidney disease with stage 1 through stage 4 chronic kidney disease, or unspecified chronic kidney disease: Secondary | ICD-10-CM | POA: Diagnosis not present

## 2015-08-13 DIAGNOSIS — I10 Essential (primary) hypertension: Secondary | ICD-10-CM | POA: Diagnosis not present

## 2015-08-13 LAB — COMPREHENSIVE METABOLIC PANEL
ALT: 48 U/L — AB (ref 6–29)
AST: 56 U/L — AB (ref 10–35)
Albumin: 3.6 g/dL (ref 3.6–5.1)
Alkaline Phosphatase: 56 U/L (ref 33–130)
BUN: 24 mg/dL (ref 7–25)
CHLORIDE: 99 mmol/L (ref 98–110)
CO2: 28 mmol/L (ref 20–31)
CREATININE: 1.13 mg/dL — AB (ref 0.60–0.93)
Calcium: 8.6 mg/dL (ref 8.6–10.4)
GLUCOSE: 81 mg/dL (ref 65–99)
Potassium: 5.2 mmol/L (ref 3.5–5.3)
Sodium: 135 mmol/L (ref 135–146)
Total Bilirubin: 0.4 mg/dL (ref 0.2–1.2)
Total Protein: 7.7 g/dL (ref 6.1–8.1)

## 2015-08-13 NOTE — Progress Notes (Signed)
[place]  Date of telephone encounter:  Admit date: 08/02/15 Discharge date: 08/06/15  PCP: Chari Manning,  NP.  HPI: Ashlee Mack is a 74 y.o. female accompanied by her husband with a history of hypertension, hyperlipidemia, chronic anemia, chronic venous stasis dermatitis who was recently hospitalized for syncope at [facility].  She was noticed to have suffered a syncopal episode at home for about 3 minutes when she went to the bathroom. On presentation to the ED she was found to be bradycardic with nonconducted PACs and intermittent junctional and carvedilol was stopped (she has refused placement of the pacemaker). Syncope was thought to be cardiogenic. MRI revealed chronic lacunar infarcts, 2-D echo revealed EF of 60-65%, no wall motion abnormalities, grade 1 diastolic dysfunction, mild aortic insufficiency, mildly dilated left atrium. She had been recently discharged on the same day after management of altered mental status and a UTI and she subsequently re presented later that day with syncope; she was continued on oral Ceftin.  Her condition improved and she was subsequently discharged recommendations to follow up outpatient.  Interval history: The patient speaks in monosyllables and the husband is a poor historian. Overall she reports doing well. Patient has No headache, No chest pain, No abdominal pain - No Nausea, No new weakness tingling or numbness, No Cough - SOB.  No Known Allergies Past Medical History  Diagnosis Date  . Coronary artery disease     Patient denies  . Hypertension   . CKD (chronic kidney disease) stage 3, GFR 30-59 ml/min 07/31/2015  . Hyperlipidemia    Current Outpatient Prescriptions on File Prior to Visit  Medication Sig Dispense Refill  . amLODipine (NORVASC) 10 MG tablet Take 1 tablet (10 mg total) by mouth daily. 30 tablet 5  . aspirin EC 81 MG tablet Take 1 tablet (81 mg total) by mouth daily. 30 tablet 5  .  atorvastatin (LIPITOR) 10 MG tablet Take 1 tablet (10 mg total) by mouth daily. For cholesterol 30 tablet 0  . ferrous sulfate 325 (65 FE) MG tablet Take 1 tablet (325 mg total) by mouth daily with breakfast. 30 tablet 3  . feeding supplement, ENSURE COMPLETE, (ENSURE COMPLETE) LIQD Take 237 mLs by mouth 2 (two) times daily between meals.    . polyethylene glycol (MIRALAX / GLYCOLAX) packet Take 17 g by mouth 2 (two) times daily. Hold if develops diarrhea. 14 each 0  . senna-docusate (SENOKOT-S) 8.6-50 MG tablet Take 1 tablet by mouth at bedtime.     No current facility-administered medications on file prior to visit.   Family History  Problem Relation Age of Onset  . Hypertension Mother   . Hypertension Father    Social History   Social History  . Marital Status: Married    Spouse Name: N/A  . Number of Children: 3  . Years of Education: N/A   Occupational History  . Not on file.   Social History Main Topics  . Smoking status: Never Smoker   . Smokeless tobacco: Not on file  . Alcohol Use: No  . Drug Use: No  . Sexual Activity: Not on file   Other Topics Concern  . Not on file   Social History Narrative    Review of Systems: Constitutional: Negative for fever, chills, diaphoresis, activity change, appetite change and fatigue. HENT: Negative for ear pain, nosebleeds, congestion, facial swelling, rhinorrhea, neck pain, neck stiffness and ear discharge.  Eyes: Negative for pain, discharge, redness, itching and visual disturbance.  Respiratory: Negative for cough, choking, chest tightness, shortness of breath, wheezing and stridor.  Cardiovascular: Negative for chest pain, palpitations and leg swelling. Gastrointestinal: Negative for abdominal distention. Genitourinary: Negative for dysuria, urgency, frequency, hematuria, flank pain, decreased urine volume, difficulty urinating and dyspareunia.  Musculoskeletal: Negative for back pain, joint swelling, arthralgias and gait  problem. Neurological: Negative for dizziness, tremors, seizures, syncope, facial asymmetry, speech difficulty, weakness, light-headedness, numbness and headaches.  Hematological: Negative for adenopathy. Does not bruise/bleed easily. Psychiatric/Behavioral: Negative for hallucinations, behavioral problems, confusion, dysphoric mood, decreased concentration and agitation.    Objective:   Filed Vitals:   08/13/15 1548  BP: 128/76  Pulse: 83  Temp: 99 F (37.2 C)  Resp: 14    Physical Exam: Constitutional: Patient appears well-developed and well-nourished. No distress. HENT: Normocephalic, atraumatic, External right and left ear normal. Oropharynx is clear and moist.  Eyes: Conjunctivae and EOM are normal. PERRLA, no scleral icterus. Neck: Normal ROM. Neck supple. No JVD. No tracheal deviation. No thyromegaly. CVS: RRR, S1/S2 +, no murmurs, no gallops, no carotid bruit.  Pulmonary: Effort and breath sounds normal, no stridor, rhonchi, wheezes, rales.  Abdominal: Soft. BS +,  no distension, tenderness, rebound or guarding.  Musculoskeletal: Normal range of motion. No edema and no tenderness.  Lymphadenopathy: No lymphadenopathy noted, cervical, inguinal or axillary Neuro: Alert. Normal reflexes, muscle tone coordination. No cranial nerve deficit. Skin: chronic venous stasis dermatitis more on the left leg Psychiatric: flat affect  Lab Results  Component Value Date   WBC 8.5 08/04/2015   HGB 8.2* 08/04/2015   HCT 25.9* 08/04/2015   MCV 75.1* 08/04/2015   PLT 574* 08/04/2015   Lab Results  Component Value Date   CREATININE 1.23* 08/06/2015   BUN 29* 08/06/2015   NA 138 08/06/2015   K 4.4 08/06/2015   CL 101 08/06/2015   CO2 28 08/06/2015    Lab Results  Component Value Date   HGBA1C 6.4* 07/31/2015   Lipid Panel     Component Value Date/Time   CHOL 199 09/11/2014 1616   TRIG 67 09/11/2014 1616   HDL 49 09/11/2014 1616   CHOLHDL 4.1 09/11/2014 1616   VLDL 13  09/11/2014 1616   LDLCALC 137* 09/11/2014 1616       Assessment and plan:  Hypertension: Controlled. Continue amlodipine.  Hyperlipidemia: Remains on atorvastatin.  Chronic anemia: She does have chronic anemia but review of her chart does not reveal any GI workup or colonoscopy. This has been explained to the family who is not interested in her having any colonoscopy. I will send off a CBC today.  Prediabetes : Diet controlled with A1c of 6.4.  Chronic kidney disease : Stage III. Likely multifactorial-age related and could be from hypertensive nephropathy. Not seeing nephrology at this time.  Bradycardia: Resolved  This note has been created with Surveyor, quantity. Any transcriptional errors are unintentional.          Arnoldo Morale, MD. [facility] [phone] 08/13/2015, 3:53 PM

## 2015-08-13 NOTE — Progress Notes (Signed)
Patient relative in the room states she is here for hospital follow up Patient reports no pain Patient relative states patient has had a flu shot this year

## 2015-08-14 ENCOUNTER — Telehealth: Payer: Self-pay | Admitting: *Deleted

## 2015-08-14 DIAGNOSIS — E46 Unspecified protein-calorie malnutrition: Secondary | ICD-10-CM | POA: Diagnosis not present

## 2015-08-14 DIAGNOSIS — N183 Chronic kidney disease, stage 3 (moderate): Secondary | ICD-10-CM | POA: Diagnosis not present

## 2015-08-14 DIAGNOSIS — N390 Urinary tract infection, site not specified: Secondary | ICD-10-CM | POA: Diagnosis not present

## 2015-08-14 DIAGNOSIS — M6281 Muscle weakness (generalized): Secondary | ICD-10-CM | POA: Diagnosis not present

## 2015-08-14 DIAGNOSIS — I129 Hypertensive chronic kidney disease with stage 1 through stage 4 chronic kidney disease, or unspecified chronic kidney disease: Secondary | ICD-10-CM | POA: Diagnosis not present

## 2015-08-14 DIAGNOSIS — I2510 Atherosclerotic heart disease of native coronary artery without angina pectoris: Secondary | ICD-10-CM | POA: Diagnosis not present

## 2015-08-14 LAB — CBC WITH DIFFERENTIAL/PLATELET
BASOS ABS: 0.3 10*3/uL — AB (ref 0.0–0.1)
Basophils Relative: 3 % — ABNORMAL HIGH (ref 0–1)
EOS PCT: 1 % (ref 0–5)
Eosinophils Absolute: 0.1 10*3/uL (ref 0.0–0.7)
HEMATOCRIT: 27.1 % — AB (ref 36.0–46.0)
Hemoglobin: 8.4 g/dL — ABNORMAL LOW (ref 12.0–15.0)
LYMPHS PCT: 31 % (ref 12–46)
Lymphs Abs: 3.1 10*3/uL (ref 0.7–4.0)
MCH: 23.1 pg — ABNORMAL LOW (ref 26.0–34.0)
MCHC: 31 g/dL (ref 30.0–36.0)
MCV: 74.5 fL — ABNORMAL LOW (ref 78.0–100.0)
MPV: 9.5 fL (ref 8.6–12.4)
Monocytes Absolute: 1.6 10*3/uL — ABNORMAL HIGH (ref 0.1–1.0)
Monocytes Relative: 16 % — ABNORMAL HIGH (ref 3–12)
NEUTROS ABS: 4.9 10*3/uL (ref 1.7–7.7)
NEUTROS PCT: 49 % (ref 43–77)
Platelets: 694 10*3/uL — ABNORMAL HIGH (ref 150–400)
RBC: 3.64 MIL/uL — AB (ref 3.87–5.11)
RDW: 28.5 % — AB (ref 11.5–15.5)
WBC: 9.9 10*3/uL (ref 4.0–10.5)

## 2015-08-14 NOTE — Telephone Encounter (Signed)
Patient's son was asked during the patient's visit yesterday if his mother had ever had a colonoscopy.  He asked what that was and it was explained to him by the RN.  RN then stated that Dr. Jarold Song recommends his mother to have one in order to possibly determine the cause of her anemia.  Son, very quickly stated to RN, "if they did not do that in the hospital then she must not need it."  Son seemed agitated and RN stated that she would let the MD know.   Spoke to patient's son Ashlee Mack who patient has told clinic staff in the past that "he is my decision maker" and we are to speak to him regarding her care.  Shared with son that this RN wanted to share patient's lab results from yesterdays visit.  RN stated per MD, that his mother was still anemic and that MD still recommends  a colonoscopy to help with diagnosis. Based on the conversation that this RN had with the son yesterday this RN made it clear that this is the family's choice to make and this RN was simply relaying results.  I offered that he might speak with his family and let me know what they decide.  RN offered call back number.  Son did not want to call RN back and told RN to "call me back and I will let you know."  RN asked when would be a good time.  Son said Monday.  RN will call son on Monday to find out decision.

## 2015-08-14 NOTE — Telephone Encounter (Signed)
-----   Message from Arnoldo Morale, MD sent at 08/14/2015  1:21 PM EST ----- Labs reveal she is still anemic and I would love to refer her for a colonoscopy but would need to have their input as the son had declined this previously.

## 2015-08-15 DIAGNOSIS — I2510 Atherosclerotic heart disease of native coronary artery without angina pectoris: Secondary | ICD-10-CM | POA: Diagnosis not present

## 2015-08-15 DIAGNOSIS — I129 Hypertensive chronic kidney disease with stage 1 through stage 4 chronic kidney disease, or unspecified chronic kidney disease: Secondary | ICD-10-CM | POA: Diagnosis not present

## 2015-08-15 DIAGNOSIS — N390 Urinary tract infection, site not specified: Secondary | ICD-10-CM | POA: Diagnosis not present

## 2015-08-15 DIAGNOSIS — N183 Chronic kidney disease, stage 3 (moderate): Secondary | ICD-10-CM | POA: Diagnosis not present

## 2015-08-15 DIAGNOSIS — E46 Unspecified protein-calorie malnutrition: Secondary | ICD-10-CM | POA: Diagnosis not present

## 2015-08-15 DIAGNOSIS — M6281 Muscle weakness (generalized): Secondary | ICD-10-CM | POA: Diagnosis not present

## 2015-08-18 DIAGNOSIS — N183 Chronic kidney disease, stage 3 (moderate): Secondary | ICD-10-CM | POA: Diagnosis not present

## 2015-08-18 DIAGNOSIS — I2510 Atherosclerotic heart disease of native coronary artery without angina pectoris: Secondary | ICD-10-CM | POA: Diagnosis not present

## 2015-08-18 DIAGNOSIS — I129 Hypertensive chronic kidney disease with stage 1 through stage 4 chronic kidney disease, or unspecified chronic kidney disease: Secondary | ICD-10-CM | POA: Diagnosis not present

## 2015-08-18 DIAGNOSIS — E46 Unspecified protein-calorie malnutrition: Secondary | ICD-10-CM | POA: Diagnosis not present

## 2015-08-18 DIAGNOSIS — M6281 Muscle weakness (generalized): Secondary | ICD-10-CM | POA: Diagnosis not present

## 2015-08-18 DIAGNOSIS — N390 Urinary tract infection, site not specified: Secondary | ICD-10-CM | POA: Diagnosis not present

## 2015-08-18 NOTE — Telephone Encounter (Signed)
Per son's request on 12/1 RN placed call to see if family had made a decision regarding patient's willingness to have a colonoscopy. Left message for him to return RN call at [phone]

## 2015-08-18 NOTE — Telephone Encounter (Signed)
-----   Message from Arnoldo Morale, MD sent at 08/14/2015  1:21 PM EST ----- Labs reveal she is still anemic and I would love to refer her for a colonoscopy but would need to have their input as the son had declined this previously.

## 2015-08-20 ENCOUNTER — Inpatient Hospital Stay: Payer: Medicare Other | Admitting: Internal Medicine

## 2015-08-20 DIAGNOSIS — N183 Chronic kidney disease, stage 3 (moderate): Secondary | ICD-10-CM | POA: Diagnosis not present

## 2015-08-20 DIAGNOSIS — M6281 Muscle weakness (generalized): Secondary | ICD-10-CM | POA: Diagnosis not present

## 2015-08-20 DIAGNOSIS — I129 Hypertensive chronic kidney disease with stage 1 through stage 4 chronic kidney disease, or unspecified chronic kidney disease: Secondary | ICD-10-CM | POA: Diagnosis not present

## 2015-08-20 DIAGNOSIS — E46 Unspecified protein-calorie malnutrition: Secondary | ICD-10-CM | POA: Diagnosis not present

## 2015-08-20 DIAGNOSIS — N390 Urinary tract infection, site not specified: Secondary | ICD-10-CM | POA: Diagnosis not present

## 2015-08-20 DIAGNOSIS — I2510 Atherosclerotic heart disease of native coronary artery without angina pectoris: Secondary | ICD-10-CM | POA: Diagnosis not present

## 2015-08-21 DIAGNOSIS — I2510 Atherosclerotic heart disease of native coronary artery without angina pectoris: Secondary | ICD-10-CM | POA: Diagnosis not present

## 2015-08-21 DIAGNOSIS — N390 Urinary tract infection, site not specified: Secondary | ICD-10-CM | POA: Diagnosis not present

## 2015-08-21 DIAGNOSIS — E46 Unspecified protein-calorie malnutrition: Secondary | ICD-10-CM | POA: Diagnosis not present

## 2015-08-21 DIAGNOSIS — I129 Hypertensive chronic kidney disease with stage 1 through stage 4 chronic kidney disease, or unspecified chronic kidney disease: Secondary | ICD-10-CM | POA: Diagnosis not present

## 2015-08-21 DIAGNOSIS — N183 Chronic kidney disease, stage 3 (moderate): Secondary | ICD-10-CM | POA: Diagnosis not present

## 2015-08-21 DIAGNOSIS — M6281 Muscle weakness (generalized): Secondary | ICD-10-CM | POA: Diagnosis not present

## 2015-08-22 DIAGNOSIS — I129 Hypertensive chronic kidney disease with stage 1 through stage 4 chronic kidney disease, or unspecified chronic kidney disease: Secondary | ICD-10-CM | POA: Diagnosis not present

## 2015-08-22 DIAGNOSIS — N183 Chronic kidney disease, stage 3 (moderate): Secondary | ICD-10-CM | POA: Diagnosis not present

## 2015-08-22 DIAGNOSIS — E46 Unspecified protein-calorie malnutrition: Secondary | ICD-10-CM | POA: Diagnosis not present

## 2015-08-22 DIAGNOSIS — M6281 Muscle weakness (generalized): Secondary | ICD-10-CM | POA: Diagnosis not present

## 2015-08-22 DIAGNOSIS — I2510 Atherosclerotic heart disease of native coronary artery without angina pectoris: Secondary | ICD-10-CM | POA: Diagnosis not present

## 2015-08-22 DIAGNOSIS — N390 Urinary tract infection, site not specified: Secondary | ICD-10-CM | POA: Diagnosis not present

## 2015-08-25 ENCOUNTER — Telehealth: Payer: Self-pay

## 2015-08-25 DIAGNOSIS — N183 Chronic kidney disease, stage 3 (moderate): Secondary | ICD-10-CM | POA: Diagnosis not present

## 2015-08-25 DIAGNOSIS — I129 Hypertensive chronic kidney disease with stage 1 through stage 4 chronic kidney disease, or unspecified chronic kidney disease: Secondary | ICD-10-CM | POA: Diagnosis not present

## 2015-08-25 DIAGNOSIS — N390 Urinary tract infection, site not specified: Secondary | ICD-10-CM | POA: Diagnosis not present

## 2015-08-25 DIAGNOSIS — I2510 Atherosclerotic heart disease of native coronary artery without angina pectoris: Secondary | ICD-10-CM | POA: Diagnosis not present

## 2015-08-25 DIAGNOSIS — E46 Unspecified protein-calorie malnutrition: Secondary | ICD-10-CM | POA: Diagnosis not present

## 2015-08-25 DIAGNOSIS — M6281 Muscle weakness (generalized): Secondary | ICD-10-CM | POA: Diagnosis not present

## 2015-08-25 NOTE — Telephone Encounter (Signed)
Attempted to contact the patient's son, Tekeisha Grewal, to remind him of his mother's appointment tomorrow, 1213/16 @ [phone]. Call placed to # [phone] and a voice mail message was left requesting a call back to #  [phone] or [phone].

## 2015-08-26 ENCOUNTER — Ambulatory Visit: Payer: Medicare Other | Attending: Internal Medicine | Admitting: Family Medicine

## 2015-08-26 ENCOUNTER — Encounter: Payer: Self-pay | Admitting: Family Medicine

## 2015-08-26 VITALS — BP 130/75 | HR 88 | Temp 98.8°F | Resp 16 | Ht 66.0 in | Wt 107.0 lb

## 2015-08-26 DIAGNOSIS — R7303 Prediabetes: Secondary | ICD-10-CM

## 2015-08-26 DIAGNOSIS — D649 Anemia, unspecified: Secondary | ICD-10-CM | POA: Insufficient documentation

## 2015-08-26 DIAGNOSIS — D509 Iron deficiency anemia, unspecified: Secondary | ICD-10-CM | POA: Insufficient documentation

## 2015-08-26 DIAGNOSIS — I129 Hypertensive chronic kidney disease with stage 1 through stage 4 chronic kidney disease, or unspecified chronic kidney disease: Secondary | ICD-10-CM | POA: Diagnosis not present

## 2015-08-26 DIAGNOSIS — I2510 Atherosclerotic heart disease of native coronary artery without angina pectoris: Secondary | ICD-10-CM | POA: Diagnosis not present

## 2015-08-26 DIAGNOSIS — I10 Essential (primary) hypertension: Secondary | ICD-10-CM | POA: Insufficient documentation

## 2015-08-26 DIAGNOSIS — M6281 Muscle weakness (generalized): Secondary | ICD-10-CM | POA: Diagnosis not present

## 2015-08-26 DIAGNOSIS — E46 Unspecified protein-calorie malnutrition: Secondary | ICD-10-CM | POA: Diagnosis not present

## 2015-08-26 DIAGNOSIS — E785 Hyperlipidemia, unspecified: Secondary | ICD-10-CM | POA: Insufficient documentation

## 2015-08-26 DIAGNOSIS — Z7982 Long term (current) use of aspirin: Secondary | ICD-10-CM | POA: Insufficient documentation

## 2015-08-26 DIAGNOSIS — N390 Urinary tract infection, site not specified: Secondary | ICD-10-CM | POA: Diagnosis not present

## 2015-08-26 DIAGNOSIS — N183 Chronic kidney disease, stage 3 unspecified: Secondary | ICD-10-CM

## 2015-08-26 DIAGNOSIS — Z79899 Other long term (current) drug therapy: Secondary | ICD-10-CM | POA: Insufficient documentation

## 2015-08-26 NOTE — Progress Notes (Signed)
[place]  Date of telephone encounter: 08/06/15, 08/03/15  Admit date: 08/02/15 Discharge date: 08/06/15  PCP: Chari Manning,  NP. Subjective:    Patient ID: Ashlee Mack, female    DOB: [birth year], 74 y.o.   MRN: [id number]  HPI 74 year old female with a history of hypertension, hyperlipidemia, prediabetes, chronic anemia, recently hospitalized for syncope thought to be cardiogenic at [facility] history is also notable for bradycardia and she has refused placement of a pacemaker. The issue of her chronic anemia has also been discussed with the patient, spouse and son who is appears to be making the major decisions as the patient speaks only monosyllables but they have made it clear that they do not want any colonoscopy even though she has not had one before.  She has no acute complaints at this time and receives [facility] health [facility] services [facility].  Past Medical History  Diagnosis Date  . Coronary artery disease     Patient denies  . Hypertension   . CKD (chronic kidney disease) stage 3, GFR 30-59 ml/min 07/31/2015  . Hyperlipidemia     Past Surgical History  Procedure Laterality Date  . Coronary angioplasty with stent placement      No Known Allergies  Current Outpatient Prescriptions on File Prior to Visit  Medication Sig Dispense Refill  . amLODipine (NORVASC) 10 MG tablet Take 1 tablet (10 mg total) by mouth daily. 30 tablet 5  . aspirin EC 81 MG tablet Take 1 tablet (81 mg total) by mouth daily. 30 tablet 5  . atorvastatin (LIPITOR) 10 MG tablet Take 1 tablet (10 mg total) by mouth daily. For cholesterol 30 tablet 0  . feeding supplement, ENSURE COMPLETE, (ENSURE COMPLETE) LIQD Take 237 mLs by mouth 2 (two) times daily between meals.    . ferrous sulfate 325 (65 FE) MG tablet Take 1 tablet (325 mg total) by mouth daily with breakfast. 30 tablet 3  . polyethylene glycol (MIRALAX / GLYCOLAX) packet Take 17 g by mouth 2  (two) times daily. Hold if develops diarrhea. 14 each 0  . senna-docusate (SENOKOT-S) 8.6-50 MG tablet Take 1 tablet by mouth at bedtime.     No current facility-administered medications on file prior to visit.      Review of Systems Constitutional: Negative for fever, chills, diaphoresis, activity change, appetite change and fatigue. HENT: Negative for ear pain, nosebleeds, congestion, facial swelling, rhinorrhea, neck pain, neck stiffness and ear discharge.  Eyes: Negative for pain, discharge, redness, itching and visual disturbance. Respiratory: Negative for cough, choking, chest tightness, shortness of breath, wheezing and stridor.  Cardiovascular: Negative for chest pain, palpitations and leg swelling. Gastrointestinal: Negative for abdominal distention. Genitourinary: Negative for dysuria, urgency, frequency, hematuria, flank pain, decreased urine volume, difficulty urinating and dyspareunia.  Musculoskeletal: Negative for back pain, joint swelling, arthralgias and gait problem. Neurological: Negative for dizziness, tremors, seizures, syncope, facial asymmetry, speech difficulty, weakness, light-headedness, numbness and headaches.  Hematological: Negative for adenopathy. Does not bruise/bleed easily. Psychiatric/Behavioral: Negative for hallucinations, behavioral problems, confusion, dysphoric mood, decreased concentration and agitation.      Objective: Filed Vitals:   08/26/15 1415  BP: 130/75  Pulse: 88  Temp: 98.8 F (37.1 C)  TempSrc: Oral  Resp: 16  Height: 5\' 6"  (1.676 m)  Weight: 107 lb (48.535 kg)  SpO2: 99%      Physical Exam Constitutional: Patient appears well-developed and well-nourished. No distress. HENT: Normocephalic, atraumatic, External right and left ear normal. Oropharynx is  clear and moist.  Eyes: Conjunctivae and EOM are normal. PERRLA, no scleral icterus. Neck: Normal ROM. Neck supple. No JVD. No tracheal deviation. No thyromegaly. CVS: RRR, S1/S2  +, no murmurs, no gallops, no carotid bruit.  Pulmonary: Effort and breath sounds normal, no stridor, rhonchi, wheezes, rales.  Abdominal: Soft. BS +,  no distension, tenderness, rebound or guarding.  Musculoskeletal: Normal range of motion. No edema and no tenderness.  Lymphadenopathy: No lymphadenopathy noted, cervical, inguinal or axillary Neuro: Alert. Normal reflexes, muscle tone coordination. No cranial nerve deficit. Skin: chronic venous stasis dermatitis more on the left leg Psychiatric: flat affect  CBC Latest Ref Rng 08/13/2015 08/04/2015 08/03/2015  WBC 4.0 - 10.5 K/uL 9.9 8.5 12.7(H)  Hemoglobin 12.0 - 15.0 g/dL 8.4(L) 8.2(L) 8.4(L)  Hematocrit 36.0 - 46.0 % 27.1(L) 25.9(L) 26.7(L)  Platelets 150 - 400 K/uL 694(H) 574(H) 634(H)         Assessment & Plan:  Hypertension: Controlled. Continue amlodipine.  Hyperlipidemia: Remains on atorvastatin.  Chronic anemia: She does have chronic anemia but review of her chart does not reveal any GI workup or colonoscopy. This has been explained to the family who is not interested in her having any colonoscopy.   Prediabetes : Diet controlled with A1c of 6.4.  Chronic kidney disease : Stage III. Likely multifactorial-age related and could be from hypertensive nephropathy. Not seeing nephrology at this time.    This note has been created with Surveyor, quantity. Any transcriptional errors are unintentional.

## 2015-08-26 NOTE — Progress Notes (Signed)
Patient's here for TCC f/up for anemia. Patient reports feeling good. Patient has no further concerns. Patient states she's taken her meds this morning.

## 2015-08-27 DIAGNOSIS — M6281 Muscle weakness (generalized): Secondary | ICD-10-CM | POA: Diagnosis not present

## 2015-08-27 DIAGNOSIS — N390 Urinary tract infection, site not specified: Secondary | ICD-10-CM | POA: Diagnosis not present

## 2015-08-27 DIAGNOSIS — I2510 Atherosclerotic heart disease of native coronary artery without angina pectoris: Secondary | ICD-10-CM | POA: Diagnosis not present

## 2015-08-27 DIAGNOSIS — N183 Chronic kidney disease, stage 3 (moderate): Secondary | ICD-10-CM | POA: Diagnosis not present

## 2015-08-27 DIAGNOSIS — E46 Unspecified protein-calorie malnutrition: Secondary | ICD-10-CM | POA: Diagnosis not present

## 2015-08-27 DIAGNOSIS — I129 Hypertensive chronic kidney disease with stage 1 through stage 4 chronic kidney disease, or unspecified chronic kidney disease: Secondary | ICD-10-CM | POA: Diagnosis not present

## 2015-08-29 DIAGNOSIS — I129 Hypertensive chronic kidney disease with stage 1 through stage 4 chronic kidney disease, or unspecified chronic kidney disease: Secondary | ICD-10-CM | POA: Diagnosis not present

## 2015-08-29 DIAGNOSIS — N183 Chronic kidney disease, stage 3 (moderate): Secondary | ICD-10-CM | POA: Diagnosis not present

## 2015-08-29 DIAGNOSIS — E46 Unspecified protein-calorie malnutrition: Secondary | ICD-10-CM | POA: Diagnosis not present

## 2015-08-29 DIAGNOSIS — I2510 Atherosclerotic heart disease of native coronary artery without angina pectoris: Secondary | ICD-10-CM | POA: Diagnosis not present

## 2015-08-29 DIAGNOSIS — N390 Urinary tract infection, site not specified: Secondary | ICD-10-CM | POA: Diagnosis not present

## 2015-08-29 DIAGNOSIS — M6281 Muscle weakness (generalized): Secondary | ICD-10-CM | POA: Diagnosis not present

## 2015-09-01 DIAGNOSIS — I129 Hypertensive chronic kidney disease with stage 1 through stage 4 chronic kidney disease, or unspecified chronic kidney disease: Secondary | ICD-10-CM | POA: Diagnosis not present

## 2015-09-01 DIAGNOSIS — I2510 Atherosclerotic heart disease of native coronary artery without angina pectoris: Secondary | ICD-10-CM | POA: Diagnosis not present

## 2015-09-01 DIAGNOSIS — N390 Urinary tract infection, site not specified: Secondary | ICD-10-CM | POA: Diagnosis not present

## 2015-09-01 DIAGNOSIS — E46 Unspecified protein-calorie malnutrition: Secondary | ICD-10-CM | POA: Diagnosis not present

## 2015-09-01 DIAGNOSIS — N183 Chronic kidney disease, stage 3 (moderate): Secondary | ICD-10-CM | POA: Diagnosis not present

## 2015-09-01 DIAGNOSIS — M6281 Muscle weakness (generalized): Secondary | ICD-10-CM | POA: Diagnosis not present

## 2015-09-02 DIAGNOSIS — I2510 Atherosclerotic heart disease of native coronary artery without angina pectoris: Secondary | ICD-10-CM | POA: Diagnosis not present

## 2015-09-02 DIAGNOSIS — N183 Chronic kidney disease, stage 3 (moderate): Secondary | ICD-10-CM | POA: Diagnosis not present

## 2015-09-02 DIAGNOSIS — I129 Hypertensive chronic kidney disease with stage 1 through stage 4 chronic kidney disease, or unspecified chronic kidney disease: Secondary | ICD-10-CM | POA: Diagnosis not present

## 2015-09-02 DIAGNOSIS — N390 Urinary tract infection, site not specified: Secondary | ICD-10-CM | POA: Diagnosis not present

## 2015-09-02 DIAGNOSIS — M6281 Muscle weakness (generalized): Secondary | ICD-10-CM | POA: Diagnosis not present

## 2015-09-02 DIAGNOSIS — E46 Unspecified protein-calorie malnutrition: Secondary | ICD-10-CM | POA: Diagnosis not present

## 2015-09-03 DIAGNOSIS — N183 Chronic kidney disease, stage 3 (moderate): Secondary | ICD-10-CM | POA: Diagnosis not present

## 2015-09-03 DIAGNOSIS — E46 Unspecified protein-calorie malnutrition: Secondary | ICD-10-CM | POA: Diagnosis not present

## 2015-09-03 DIAGNOSIS — I129 Hypertensive chronic kidney disease with stage 1 through stage 4 chronic kidney disease, or unspecified chronic kidney disease: Secondary | ICD-10-CM | POA: Diagnosis not present

## 2015-09-03 DIAGNOSIS — M6281 Muscle weakness (generalized): Secondary | ICD-10-CM | POA: Diagnosis not present

## 2015-09-03 DIAGNOSIS — N390 Urinary tract infection, site not specified: Secondary | ICD-10-CM | POA: Diagnosis not present

## 2015-09-03 DIAGNOSIS — I2510 Atherosclerotic heart disease of native coronary artery without angina pectoris: Secondary | ICD-10-CM | POA: Diagnosis not present

## 2015-09-08 DIAGNOSIS — I2510 Atherosclerotic heart disease of native coronary artery without angina pectoris: Secondary | ICD-10-CM | POA: Diagnosis not present

## 2015-09-08 DIAGNOSIS — N390 Urinary tract infection, site not specified: Secondary | ICD-10-CM | POA: Diagnosis not present

## 2015-09-08 DIAGNOSIS — I129 Hypertensive chronic kidney disease with stage 1 through stage 4 chronic kidney disease, or unspecified chronic kidney disease: Secondary | ICD-10-CM | POA: Diagnosis not present

## 2015-09-08 DIAGNOSIS — E46 Unspecified protein-calorie malnutrition: Secondary | ICD-10-CM | POA: Diagnosis not present

## 2015-09-08 DIAGNOSIS — N183 Chronic kidney disease, stage 3 (moderate): Secondary | ICD-10-CM | POA: Diagnosis not present

## 2015-09-08 DIAGNOSIS — M6281 Muscle weakness (generalized): Secondary | ICD-10-CM | POA: Diagnosis not present

## 2015-09-10 DIAGNOSIS — E46 Unspecified protein-calorie malnutrition: Secondary | ICD-10-CM | POA: Diagnosis not present

## 2015-09-10 DIAGNOSIS — I129 Hypertensive chronic kidney disease with stage 1 through stage 4 chronic kidney disease, or unspecified chronic kidney disease: Secondary | ICD-10-CM | POA: Diagnosis not present

## 2015-09-10 DIAGNOSIS — M6281 Muscle weakness (generalized): Secondary | ICD-10-CM | POA: Diagnosis not present

## 2015-09-10 DIAGNOSIS — N390 Urinary tract infection, site not specified: Secondary | ICD-10-CM | POA: Diagnosis not present

## 2015-09-10 DIAGNOSIS — N183 Chronic kidney disease, stage 3 (moderate): Secondary | ICD-10-CM | POA: Diagnosis not present

## 2015-09-10 DIAGNOSIS — I2510 Atherosclerotic heart disease of native coronary artery without angina pectoris: Secondary | ICD-10-CM | POA: Diagnosis not present

## 2015-09-11 DIAGNOSIS — E46 Unspecified protein-calorie malnutrition: Secondary | ICD-10-CM | POA: Diagnosis not present

## 2015-09-11 DIAGNOSIS — I129 Hypertensive chronic kidney disease with stage 1 through stage 4 chronic kidney disease, or unspecified chronic kidney disease: Secondary | ICD-10-CM | POA: Diagnosis not present

## 2015-09-11 DIAGNOSIS — M6281 Muscle weakness (generalized): Secondary | ICD-10-CM | POA: Diagnosis not present

## 2015-09-11 DIAGNOSIS — N390 Urinary tract infection, site not specified: Secondary | ICD-10-CM | POA: Diagnosis not present

## 2015-09-11 DIAGNOSIS — I2510 Atherosclerotic heart disease of native coronary artery without angina pectoris: Secondary | ICD-10-CM | POA: Diagnosis not present

## 2015-09-11 DIAGNOSIS — N183 Chronic kidney disease, stage 3 (moderate): Secondary | ICD-10-CM | POA: Diagnosis not present

## 2015-09-12 DIAGNOSIS — N390 Urinary tract infection, site not specified: Secondary | ICD-10-CM | POA: Diagnosis not present

## 2015-09-12 DIAGNOSIS — I129 Hypertensive chronic kidney disease with stage 1 through stage 4 chronic kidney disease, or unspecified chronic kidney disease: Secondary | ICD-10-CM | POA: Diagnosis not present

## 2015-09-12 DIAGNOSIS — I2510 Atherosclerotic heart disease of native coronary artery without angina pectoris: Secondary | ICD-10-CM | POA: Diagnosis not present

## 2015-09-12 DIAGNOSIS — E46 Unspecified protein-calorie malnutrition: Secondary | ICD-10-CM | POA: Diagnosis not present

## 2015-09-12 DIAGNOSIS — M6281 Muscle weakness (generalized): Secondary | ICD-10-CM | POA: Diagnosis not present

## 2015-09-12 DIAGNOSIS — N183 Chronic kidney disease, stage 3 (moderate): Secondary | ICD-10-CM | POA: Diagnosis not present

## 2015-09-15 DIAGNOSIS — M6281 Muscle weakness (generalized): Secondary | ICD-10-CM | POA: Diagnosis not present

## 2015-09-15 DIAGNOSIS — N390 Urinary tract infection, site not specified: Secondary | ICD-10-CM | POA: Diagnosis not present

## 2015-09-15 DIAGNOSIS — I129 Hypertensive chronic kidney disease with stage 1 through stage 4 chronic kidney disease, or unspecified chronic kidney disease: Secondary | ICD-10-CM | POA: Diagnosis not present

## 2015-09-15 DIAGNOSIS — E46 Unspecified protein-calorie malnutrition: Secondary | ICD-10-CM | POA: Diagnosis not present

## 2015-09-15 DIAGNOSIS — N183 Chronic kidney disease, stage 3 (moderate): Secondary | ICD-10-CM | POA: Diagnosis not present

## 2015-09-15 DIAGNOSIS — I2510 Atherosclerotic heart disease of native coronary artery without angina pectoris: Secondary | ICD-10-CM | POA: Diagnosis not present

## 2015-09-17 DIAGNOSIS — I2510 Atherosclerotic heart disease of native coronary artery without angina pectoris: Secondary | ICD-10-CM | POA: Diagnosis not present

## 2015-09-17 DIAGNOSIS — E46 Unspecified protein-calorie malnutrition: Secondary | ICD-10-CM | POA: Diagnosis not present

## 2015-09-17 DIAGNOSIS — N183 Chronic kidney disease, stage 3 (moderate): Secondary | ICD-10-CM | POA: Diagnosis not present

## 2015-09-17 DIAGNOSIS — I129 Hypertensive chronic kidney disease with stage 1 through stage 4 chronic kidney disease, or unspecified chronic kidney disease: Secondary | ICD-10-CM | POA: Diagnosis not present

## 2015-09-17 DIAGNOSIS — M6281 Muscle weakness (generalized): Secondary | ICD-10-CM | POA: Diagnosis not present

## 2015-09-17 DIAGNOSIS — N390 Urinary tract infection, site not specified: Secondary | ICD-10-CM | POA: Diagnosis not present

## 2015-09-18 DIAGNOSIS — I2510 Atherosclerotic heart disease of native coronary artery without angina pectoris: Secondary | ICD-10-CM | POA: Diagnosis not present

## 2015-09-18 DIAGNOSIS — I129 Hypertensive chronic kidney disease with stage 1 through stage 4 chronic kidney disease, or unspecified chronic kidney disease: Secondary | ICD-10-CM | POA: Diagnosis not present

## 2015-09-18 DIAGNOSIS — N183 Chronic kidney disease, stage 3 (moderate): Secondary | ICD-10-CM | POA: Diagnosis not present

## 2015-09-18 DIAGNOSIS — E46 Unspecified protein-calorie malnutrition: Secondary | ICD-10-CM | POA: Diagnosis not present

## 2015-09-18 DIAGNOSIS — N390 Urinary tract infection, site not specified: Secondary | ICD-10-CM | POA: Diagnosis not present

## 2015-09-18 DIAGNOSIS — M6281 Muscle weakness (generalized): Secondary | ICD-10-CM | POA: Diagnosis not present

## 2015-09-19 DIAGNOSIS — N390 Urinary tract infection, site not specified: Secondary | ICD-10-CM | POA: Diagnosis not present

## 2015-09-19 DIAGNOSIS — I129 Hypertensive chronic kidney disease with stage 1 through stage 4 chronic kidney disease, or unspecified chronic kidney disease: Secondary | ICD-10-CM | POA: Diagnosis not present

## 2015-09-19 DIAGNOSIS — I2510 Atherosclerotic heart disease of native coronary artery without angina pectoris: Secondary | ICD-10-CM | POA: Diagnosis not present

## 2015-09-19 DIAGNOSIS — E46 Unspecified protein-calorie malnutrition: Secondary | ICD-10-CM | POA: Diagnosis not present

## 2015-09-19 DIAGNOSIS — N183 Chronic kidney disease, stage 3 (moderate): Secondary | ICD-10-CM | POA: Diagnosis not present

## 2015-09-19 DIAGNOSIS — M6281 Muscle weakness (generalized): Secondary | ICD-10-CM | POA: Diagnosis not present

## 2015-09-24 DIAGNOSIS — I2510 Atherosclerotic heart disease of native coronary artery without angina pectoris: Secondary | ICD-10-CM | POA: Diagnosis not present

## 2015-09-24 DIAGNOSIS — I129 Hypertensive chronic kidney disease with stage 1 through stage 4 chronic kidney disease, or unspecified chronic kidney disease: Secondary | ICD-10-CM | POA: Diagnosis not present

## 2015-09-24 DIAGNOSIS — N183 Chronic kidney disease, stage 3 (moderate): Secondary | ICD-10-CM | POA: Diagnosis not present

## 2015-09-24 DIAGNOSIS — N390 Urinary tract infection, site not specified: Secondary | ICD-10-CM | POA: Diagnosis not present

## 2015-09-24 DIAGNOSIS — E46 Unspecified protein-calorie malnutrition: Secondary | ICD-10-CM | POA: Diagnosis not present

## 2015-09-24 DIAGNOSIS — M6281 Muscle weakness (generalized): Secondary | ICD-10-CM | POA: Diagnosis not present

## 2015-09-25 DIAGNOSIS — I129 Hypertensive chronic kidney disease with stage 1 through stage 4 chronic kidney disease, or unspecified chronic kidney disease: Secondary | ICD-10-CM | POA: Diagnosis not present

## 2015-09-25 DIAGNOSIS — E46 Unspecified protein-calorie malnutrition: Secondary | ICD-10-CM | POA: Diagnosis not present

## 2015-09-25 DIAGNOSIS — N183 Chronic kidney disease, stage 3 (moderate): Secondary | ICD-10-CM | POA: Diagnosis not present

## 2015-09-25 DIAGNOSIS — N390 Urinary tract infection, site not specified: Secondary | ICD-10-CM | POA: Diagnosis not present

## 2015-09-25 DIAGNOSIS — M6281 Muscle weakness (generalized): Secondary | ICD-10-CM | POA: Diagnosis not present

## 2015-09-25 DIAGNOSIS — I2510 Atherosclerotic heart disease of native coronary artery without angina pectoris: Secondary | ICD-10-CM | POA: Diagnosis not present

## 2015-09-26 DIAGNOSIS — M6281 Muscle weakness (generalized): Secondary | ICD-10-CM | POA: Diagnosis not present

## 2015-09-26 DIAGNOSIS — I129 Hypertensive chronic kidney disease with stage 1 through stage 4 chronic kidney disease, or unspecified chronic kidney disease: Secondary | ICD-10-CM | POA: Diagnosis not present

## 2015-09-26 DIAGNOSIS — N183 Chronic kidney disease, stage 3 (moderate): Secondary | ICD-10-CM | POA: Diagnosis not present

## 2015-09-26 DIAGNOSIS — N390 Urinary tract infection, site not specified: Secondary | ICD-10-CM | POA: Diagnosis not present

## 2015-09-26 DIAGNOSIS — E46 Unspecified protein-calorie malnutrition: Secondary | ICD-10-CM | POA: Diagnosis not present

## 2015-09-26 DIAGNOSIS — I2510 Atherosclerotic heart disease of native coronary artery without angina pectoris: Secondary | ICD-10-CM | POA: Diagnosis not present

## 2015-09-29 DIAGNOSIS — I2510 Atherosclerotic heart disease of native coronary artery without angina pectoris: Secondary | ICD-10-CM | POA: Diagnosis not present

## 2015-09-29 DIAGNOSIS — E46 Unspecified protein-calorie malnutrition: Secondary | ICD-10-CM | POA: Diagnosis not present

## 2015-09-29 DIAGNOSIS — N183 Chronic kidney disease, stage 3 (moderate): Secondary | ICD-10-CM | POA: Diagnosis not present

## 2015-09-29 DIAGNOSIS — N390 Urinary tract infection, site not specified: Secondary | ICD-10-CM | POA: Diagnosis not present

## 2015-09-29 DIAGNOSIS — M6281 Muscle weakness (generalized): Secondary | ICD-10-CM | POA: Diagnosis not present

## 2015-09-29 DIAGNOSIS — I129 Hypertensive chronic kidney disease with stage 1 through stage 4 chronic kidney disease, or unspecified chronic kidney disease: Secondary | ICD-10-CM | POA: Diagnosis not present

## 2015-10-01 ENCOUNTER — Other Ambulatory Visit: Payer: Self-pay | Admitting: Internal Medicine

## 2015-10-01 DIAGNOSIS — I129 Hypertensive chronic kidney disease with stage 1 through stage 4 chronic kidney disease, or unspecified chronic kidney disease: Secondary | ICD-10-CM | POA: Diagnosis not present

## 2015-10-01 DIAGNOSIS — M6281 Muscle weakness (generalized): Secondary | ICD-10-CM | POA: Diagnosis not present

## 2015-10-01 DIAGNOSIS — E46 Unspecified protein-calorie malnutrition: Secondary | ICD-10-CM | POA: Diagnosis not present

## 2015-10-01 DIAGNOSIS — N390 Urinary tract infection, site not specified: Secondary | ICD-10-CM | POA: Diagnosis not present

## 2015-10-01 DIAGNOSIS — N183 Chronic kidney disease, stage 3 (moderate): Secondary | ICD-10-CM | POA: Diagnosis not present

## 2015-10-01 DIAGNOSIS — I2510 Atherosclerotic heart disease of native coronary artery without angina pectoris: Secondary | ICD-10-CM | POA: Diagnosis not present

## 2015-10-02 DIAGNOSIS — I2510 Atherosclerotic heart disease of native coronary artery without angina pectoris: Secondary | ICD-10-CM | POA: Diagnosis not present

## 2015-10-02 DIAGNOSIS — M6281 Muscle weakness (generalized): Secondary | ICD-10-CM | POA: Diagnosis not present

## 2015-10-02 DIAGNOSIS — I129 Hypertensive chronic kidney disease with stage 1 through stage 4 chronic kidney disease, or unspecified chronic kidney disease: Secondary | ICD-10-CM | POA: Diagnosis not present

## 2015-10-02 DIAGNOSIS — E46 Unspecified protein-calorie malnutrition: Secondary | ICD-10-CM | POA: Diagnosis not present

## 2015-10-02 DIAGNOSIS — N183 Chronic kidney disease, stage 3 (moderate): Secondary | ICD-10-CM | POA: Diagnosis not present

## 2015-10-02 DIAGNOSIS — N390 Urinary tract infection, site not specified: Secondary | ICD-10-CM | POA: Diagnosis not present

## 2015-10-03 DIAGNOSIS — I2510 Atherosclerotic heart disease of native coronary artery without angina pectoris: Secondary | ICD-10-CM | POA: Diagnosis not present

## 2015-10-03 DIAGNOSIS — N390 Urinary tract infection, site not specified: Secondary | ICD-10-CM | POA: Diagnosis not present

## 2015-10-03 DIAGNOSIS — M6281 Muscle weakness (generalized): Secondary | ICD-10-CM | POA: Diagnosis not present

## 2015-10-03 DIAGNOSIS — N183 Chronic kidney disease, stage 3 (moderate): Secondary | ICD-10-CM | POA: Diagnosis not present

## 2015-10-03 DIAGNOSIS — I129 Hypertensive chronic kidney disease with stage 1 through stage 4 chronic kidney disease, or unspecified chronic kidney disease: Secondary | ICD-10-CM | POA: Diagnosis not present

## 2015-10-03 DIAGNOSIS — E46 Unspecified protein-calorie malnutrition: Secondary | ICD-10-CM | POA: Diagnosis not present

## 2015-11-02 ENCOUNTER — Other Ambulatory Visit: Payer: Self-pay | Admitting: Internal Medicine

## 2015-12-21 IMAGING — CT CT HEAD W/O CM
1 series · 16 of 27 positions shown, 20 images · non-contrast
Comparison: None

CLINICAL DATA: Aphasia earlier today, now resolved

EXAM:
CT HEAD WITHOUT CONTRAST
TECHNIQUE: Contiguous axial images were obtained from the base of the skull
through the vertex without intravenous contrast.

[Series 2: head 5.0 h30s · axial · 0.39mm/px · z∈[-163,-43]mm · 16 of 27 slices shown, 20 images]
[im 2/27  brain]
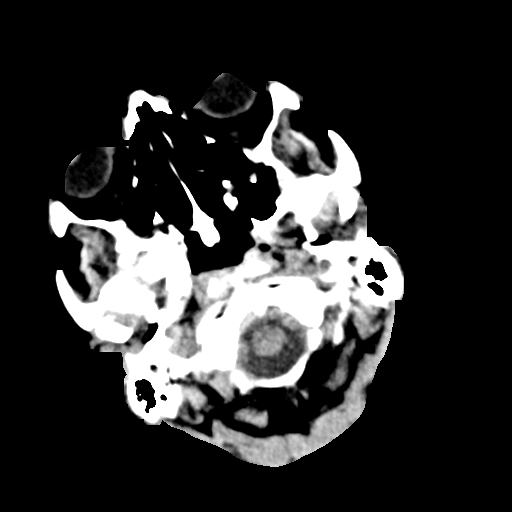
[im 2/27  bone]
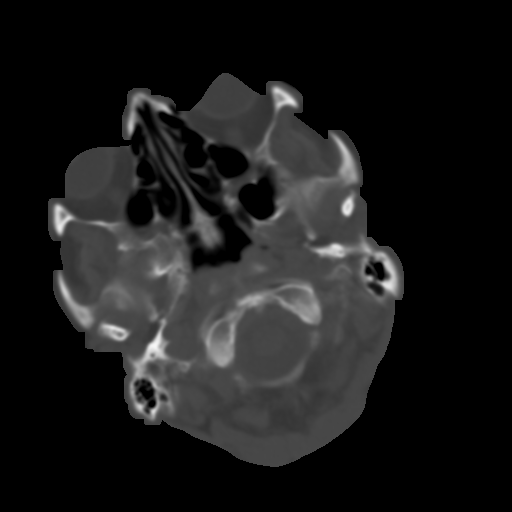
[im 4/27  brain]
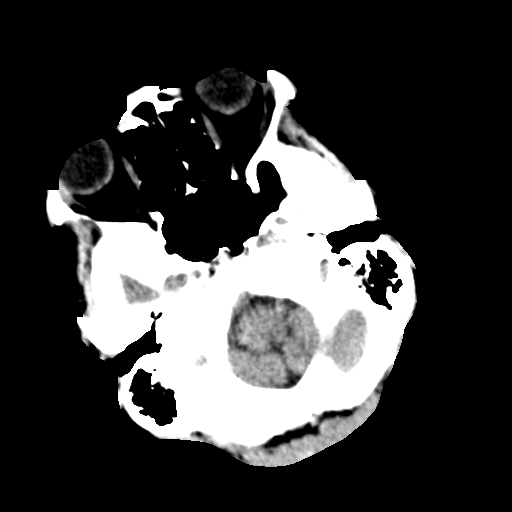
[im 5/27  brain]
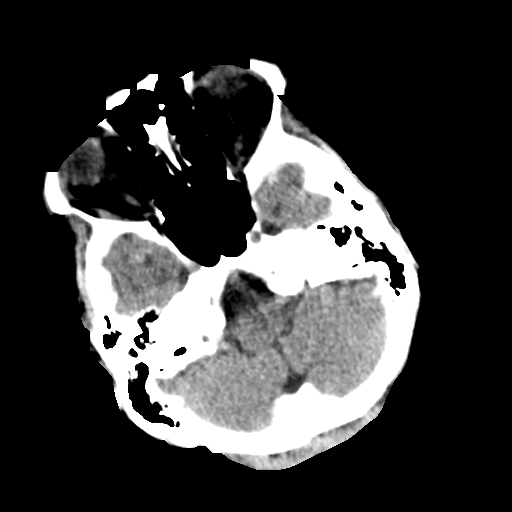
[im 7/27  brain]
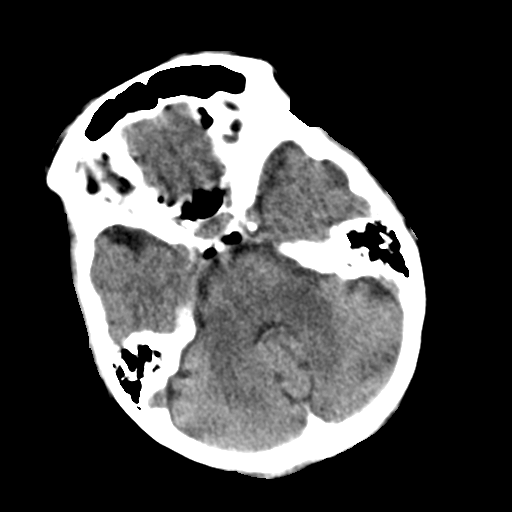
[im 9/27  brain]
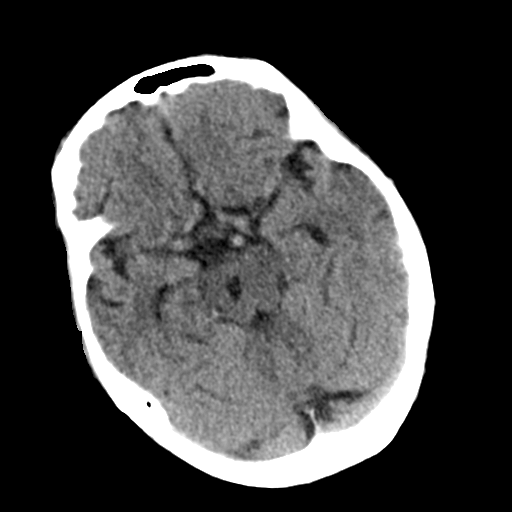
[im 9/27  bone]
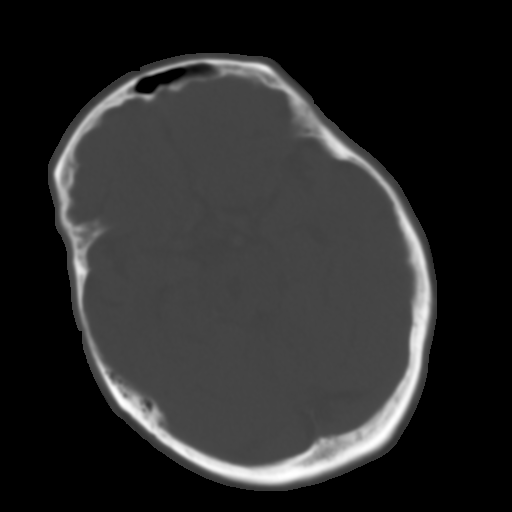
[im 10/27  brain]
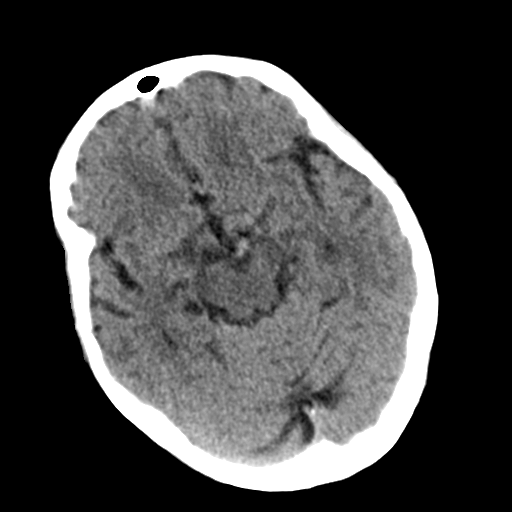
[im 12/27  brain]
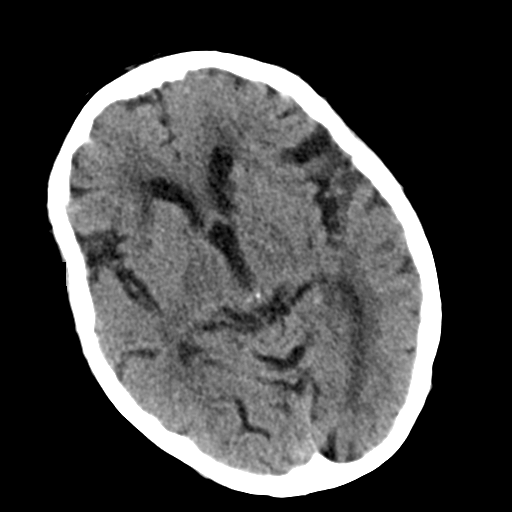
[im 13/27  brain]
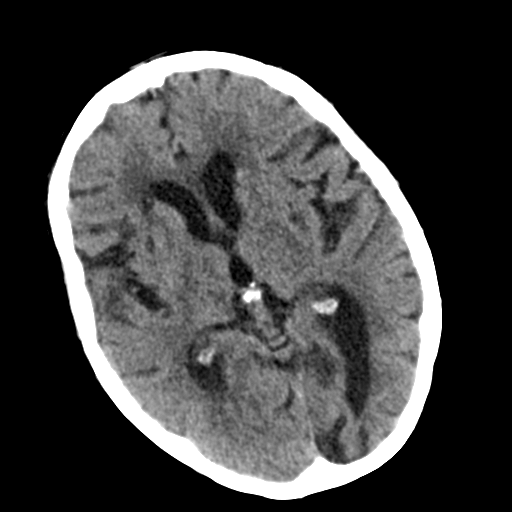
[im 15/27  brain]
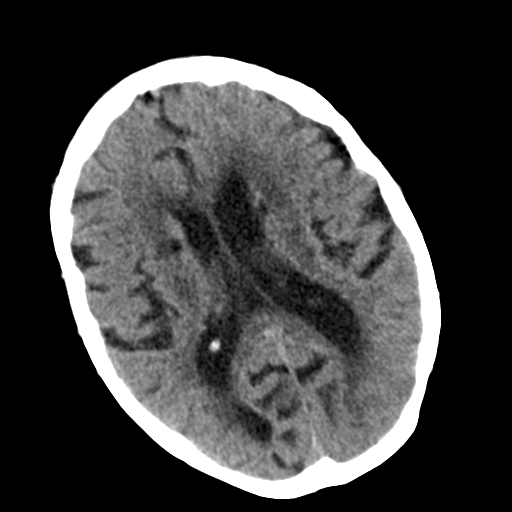
[im 15/27  bone]
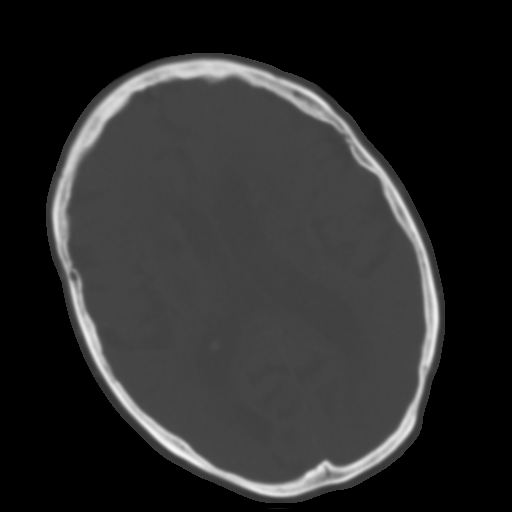
[im 16/27  brain]
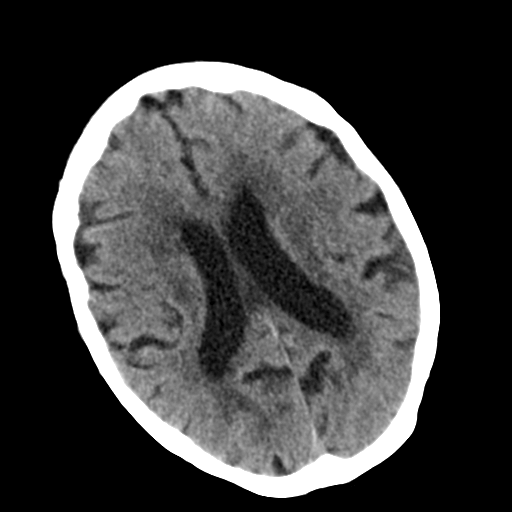
[im 18/27  brain]
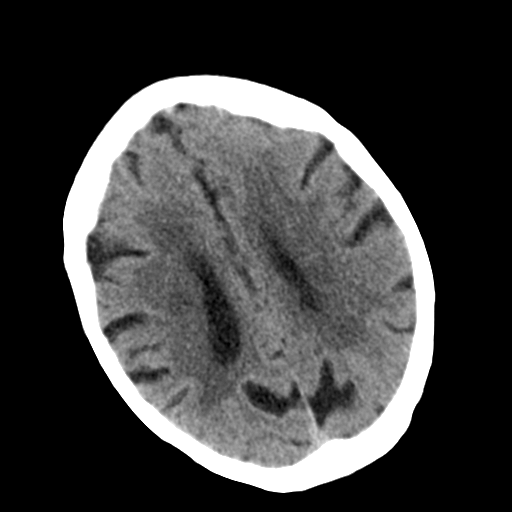
[im 19/27  brain]
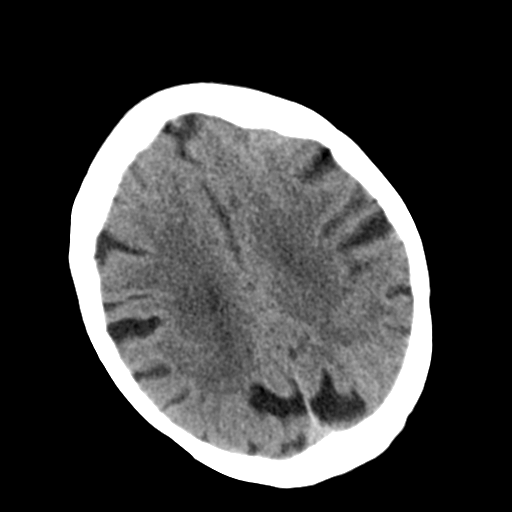
[im 21/27  brain]
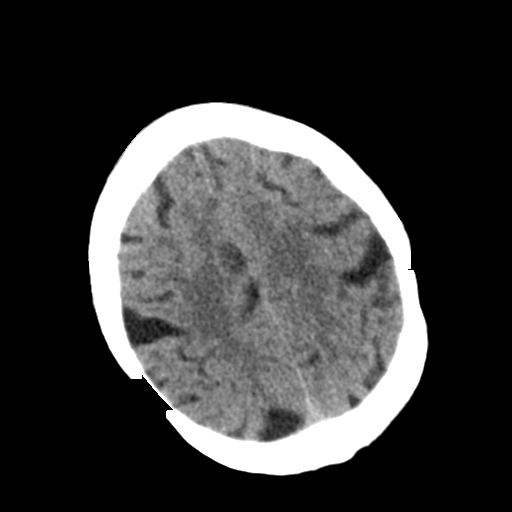
[im 21/27  bone]
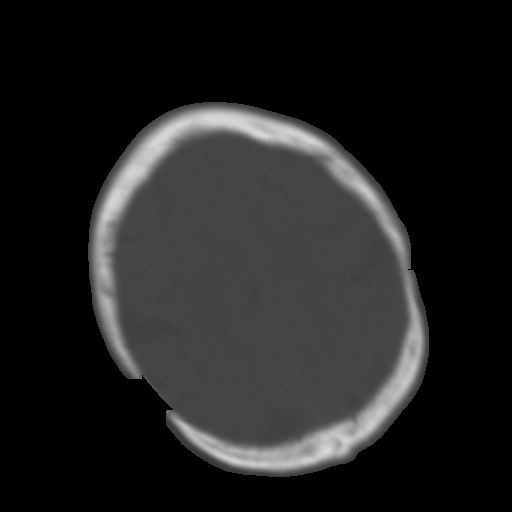
[im 23/27  brain]
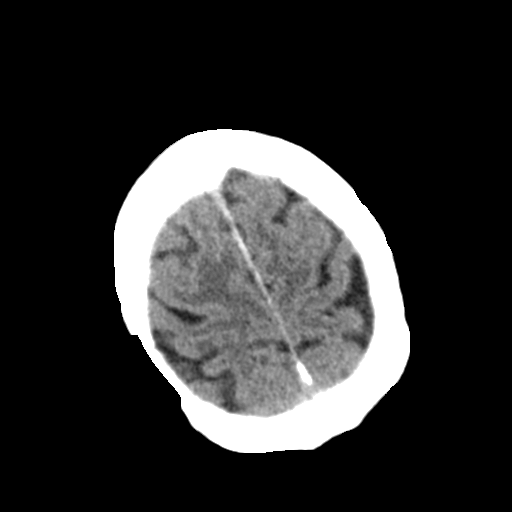
[im 24/27  brain]
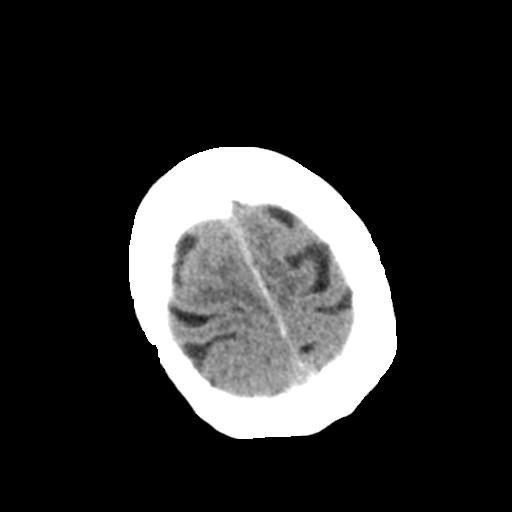
[im 26/27  brain]
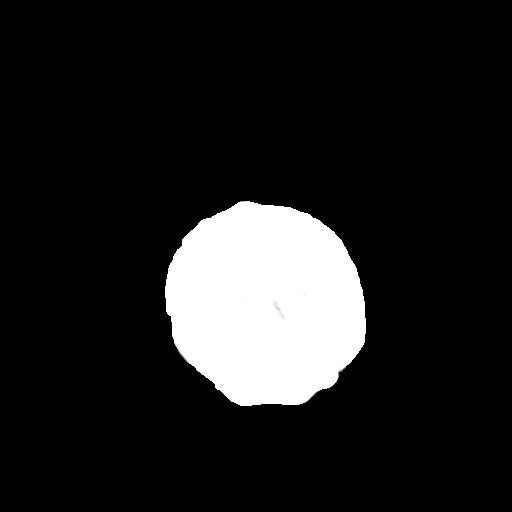

[16 of 27 positions shown; findings below may reference images not displayed]

FINDINGS: Generalized atrophy, more pronounced at LEFT occipital lobe.

Normal ventricular morphology.

No midline shift or mass effect.

Small vessel chronic ischemic changes of deep cerebral white matter.

Old BILATERAL caudate and RIGHT pontine lacunar infarcts.

Question old external capsule infarcts bilaterally.

No intracranial hemorrhage mass lesion or evidence acute infarction.

No extra-axial fluid collection.

Bones and sinuses unremarkable.
IMPRESSION: Atrophy with small vessel chronic ischemic changes of deep cerebral
white matter and old lacunar infarcts at the caudate nuclei and
RIGHT pons.

No acute intracranial abnormalities.

## 2016-01-01 ENCOUNTER — Encounter (HOSPITAL_COMMUNITY): Payer: Self-pay | Admitting: Emergency Medicine

## 2016-01-01 ENCOUNTER — Inpatient Hospital Stay (HOSPITAL_COMMUNITY)
Admission: EM | Admit: 2016-01-01 | Discharge: 2016-01-05 | DRG: 872 | Disposition: A | Discharge status: Home Health | Payer: Medicare Other | Attending: Internal Medicine | Admitting: Internal Medicine

## 2016-01-01 ENCOUNTER — Emergency Department (HOSPITAL_COMMUNITY): Payer: Medicare Other

## 2016-01-01 DIAGNOSIS — A419 Sepsis, unspecified organism: Secondary | ICD-10-CM | POA: Diagnosis not present

## 2016-01-01 DIAGNOSIS — E785 Hyperlipidemia, unspecified: Secondary | ICD-10-CM | POA: Diagnosis present

## 2016-01-01 DIAGNOSIS — A4151 Sepsis due to Escherichia coli [E. coli]: Secondary | ICD-10-CM | POA: Diagnosis not present

## 2016-01-01 DIAGNOSIS — Z8249 Family history of ischemic heart disease and other diseases of the circulatory system: Secondary | ICD-10-CM | POA: Diagnosis not present

## 2016-01-01 DIAGNOSIS — D7589 Other specified diseases of blood and blood-forming organs: Secondary | ICD-10-CM | POA: Diagnosis present

## 2016-01-01 DIAGNOSIS — R531 Weakness: Secondary | ICD-10-CM | POA: Diagnosis not present

## 2016-01-01 DIAGNOSIS — N183 Chronic kidney disease, stage 3 unspecified: Secondary | ICD-10-CM | POA: Diagnosis present

## 2016-01-01 DIAGNOSIS — I129 Hypertensive chronic kidney disease with stage 1 through stage 4 chronic kidney disease, or unspecified chronic kidney disease: Secondary | ICD-10-CM | POA: Diagnosis present

## 2016-01-01 DIAGNOSIS — R32 Unspecified urinary incontinence: Secondary | ICD-10-CM | POA: Diagnosis present

## 2016-01-01 DIAGNOSIS — D638 Anemia in other chronic diseases classified elsewhere: Secondary | ICD-10-CM | POA: Diagnosis not present

## 2016-01-01 DIAGNOSIS — E611 Iron deficiency: Secondary | ICD-10-CM | POA: Diagnosis present

## 2016-01-01 DIAGNOSIS — I10 Essential (primary) hypertension: Secondary | ICD-10-CM | POA: Diagnosis not present

## 2016-01-01 DIAGNOSIS — N390 Urinary tract infection, site not specified: Secondary | ICD-10-CM | POA: Diagnosis not present

## 2016-01-01 DIAGNOSIS — Z66 Do not resuscitate: Secondary | ICD-10-CM | POA: Diagnosis present

## 2016-01-01 DIAGNOSIS — E440 Moderate protein-calorie malnutrition: Secondary | ICD-10-CM | POA: Diagnosis present

## 2016-01-01 DIAGNOSIS — Z955 Presence of coronary angioplasty implant and graft: Secondary | ICD-10-CM | POA: Diagnosis not present

## 2016-01-01 DIAGNOSIS — I2510 Atherosclerotic heart disease of native coronary artery without angina pectoris: Secondary | ICD-10-CM | POA: Diagnosis present

## 2016-01-01 DIAGNOSIS — R509 Fever, unspecified: Secondary | ICD-10-CM | POA: Diagnosis not present

## 2016-01-01 DIAGNOSIS — D509 Iron deficiency anemia, unspecified: Secondary | ICD-10-CM | POA: Diagnosis not present

## 2016-01-01 DIAGNOSIS — I959 Hypotension, unspecified: Secondary | ICD-10-CM | POA: Diagnosis present

## 2016-01-01 DIAGNOSIS — Z79899 Other long term (current) drug therapy: Secondary | ICD-10-CM

## 2016-01-01 LAB — COMPREHENSIVE METABOLIC PANEL
ALT: 36 U/L (ref 14–54)
AST: 54 U/L — AB (ref 15–41)
Albumin: 4 g/dL (ref 3.5–5.0)
Alkaline Phosphatase: 73 U/L (ref 38–126)
Anion gap: 13 (ref 5–15)
BILIRUBIN TOTAL: 0.6 mg/dL (ref 0.3–1.2)
BUN: 15 mg/dL (ref 6–20)
CALCIUM: 9.4 mg/dL (ref 8.9–10.3)
CO2: 24 mmol/L (ref 22–32)
CREATININE: 1.12 mg/dL — AB (ref 0.44–1.00)
Chloride: 101 mmol/L (ref 101–111)
GFR calc Af Amer: 55 mL/min — ABNORMAL LOW (ref >60)
GFR, EST NON AFRICAN AMERICAN: 48 mL/min — AB (ref >60)
Glucose, Bld: 116 mg/dL — ABNORMAL HIGH (ref 65–99)
Potassium: 3.5 mmol/L (ref 3.5–5.1)
Sodium: 138 mmol/L (ref 135–145)
TOTAL PROTEIN: 8.8 g/dL — AB (ref 6.5–8.1)

## 2016-01-01 LAB — CBC WITH DIFFERENTIAL/PLATELET
BASOS ABS: 0.3 10*3/uL — AB (ref 0.0–0.1)
Basophils Relative: 2 %
EOS ABS: 0 10*3/uL (ref 0.0–0.7)
Eosinophils Relative: 0 %
HCT: 31.7 % — ABNORMAL LOW (ref 36.0–46.0)
Hemoglobin: 10.1 g/dL — ABNORMAL LOW (ref 12.0–15.0)
LYMPHS ABS: 3.1 10*3/uL (ref 0.7–4.0)
Lymphocytes Relative: 19 %
MCH: 22.4 pg — AB (ref 26.0–34.0)
MCHC: 31.9 g/dL (ref 30.0–36.0)
MCV: 70.4 fL — AB (ref 78.0–100.0)
Monocytes Absolute: 2.3 10*3/uL — ABNORMAL HIGH (ref 0.1–1.0)
Monocytes Relative: 14 %
NEUTROS ABS: 10.4 10*3/uL — AB (ref 1.7–7.7)
Neutrophils Relative %: 65 %
PLATELETS: 847 10*3/uL — AB (ref 150–400)
RBC: 4.5 MIL/uL (ref 3.87–5.11)
RDW: 28.7 % — AB (ref 11.5–15.5)
WBC: 16.1 10*3/uL — ABNORMAL HIGH (ref 4.0–10.5)

## 2016-01-01 LAB — URINALYSIS, ROUTINE W REFLEX MICROSCOPIC
Bilirubin Urine: NEGATIVE
GLUCOSE, UA: NEGATIVE mg/dL
HGB URINE DIPSTICK: NEGATIVE
Ketones, ur: NEGATIVE mg/dL
Nitrite: NEGATIVE
PROTEIN: 30 mg/dL — AB
Specific Gravity, Urine: 1.013 (ref 1.005–1.030)
pH: 6.5 (ref 5.0–8.0)

## 2016-01-01 LAB — URINE MICROSCOPIC-ADD ON

## 2016-01-01 LAB — I-STAT CG4 LACTIC ACID, ED: LACTIC ACID, VENOUS: 1.85 mmol/L (ref 0.5–2.0)

## 2016-01-01 MED ORDER — SODIUM CHLORIDE 0.9 % IV BOLUS (SEPSIS)
500.0000 mL | Freq: Once | INTRAVENOUS | Status: AC
  Administered 2016-01-01: 500 mL via INTRAVENOUS

## 2016-01-01 MED ORDER — DEXTROSE 5 % IV SOLN
1.0000 g | Freq: Once | INTRAVENOUS | Status: AC
  Administered 2016-01-01: 1 g via INTRAVENOUS
  Filled 2016-01-01: qty 10

## 2016-01-01 MED ORDER — ATORVASTATIN CALCIUM 10 MG PO TABS
10.0000 mg | ORAL_TABLET | Freq: Every day | ORAL | Status: DC
  Administered 2016-01-02 – 2016-01-04 (×3): 10 mg via ORAL
  Filled 2016-01-01 (×3): qty 1

## 2016-01-01 MED ORDER — SODIUM CHLORIDE 0.9 % IV SOLN
INTRAVENOUS | Status: DC
  Administered 2016-01-01 – 2016-01-03 (×5): via INTRAVENOUS

## 2016-01-01 MED ORDER — ENOXAPARIN SODIUM 40 MG/0.4ML ~~LOC~~ SOLN
40.0000 mg | Freq: Every day | SUBCUTANEOUS | Status: DC
  Administered 2016-01-01 – 2016-01-04 (×4): 40 mg via SUBCUTANEOUS
  Filled 2016-01-01 (×4): qty 0.4

## 2016-01-01 MED ORDER — SODIUM CHLORIDE 0.9 % IV BOLUS (SEPSIS)
1000.0000 mL | Freq: Once | INTRAVENOUS | Status: AC
  Administered 2016-01-01: 1000 mL via INTRAVENOUS

## 2016-01-01 MED ORDER — DEXTROSE 5 % IV SOLN
1.0000 g | INTRAVENOUS | Status: DC
  Administered 2016-01-02: 1 g via INTRAVENOUS
  Filled 2016-01-01: qty 10

## 2016-01-01 NOTE — ED Notes (Signed)
Pt son states pt has been getting increasingly weak over the last week. States they've come for this before, hx of frequent UTIs. Tachycardic in triage, low grade fever, appears pale

## 2016-01-01 NOTE — ED Notes (Signed)
Asked EDP Pickering about blood cultures before antibiotic start. No blood cultures to be ordered.

## 2016-01-01 NOTE — H&P (Signed)
History and Physical    Ashlee Mack [id number] DOB: 29-Nov-1940 DOA: 01/01/2016  Referring MD/NP/PA: Dr. Alvino Chapel PCP: Lance Bosch, NP Outpatient Specialists: None Patient coming from: Home  Chief Complaint: Generalized weakness, h/o UTIs  HPI: Ashlee Mack is a 75 y.o. female with medical history significant of UTIs frequently in past, HTN, patient presents to the ED with c/o generalized weakness worse over the past couple of weeks.  Decreased PO intake.  Family has noted some change in urination.    ED Course: UTI confirmed on UA, lab work and vitals also worrisome for sepsis with leukocytosis of 16k and tachycardia into the 120s.  Given 1.5L IVF bolus, rocephin, and we are called to admit.  Review of Systems: As per HPI otherwise 10 point review of systems negative.    Past Medical History  Diagnosis Date  . Coronary artery disease     Patient denies  . Hypertension   . CKD (chronic kidney disease) stage 3, GFR 30-59 ml/min 07/31/2015  . Hyperlipidemia     Past Surgical History  Procedure Laterality Date  . Coronary angioplasty with stent placement       reports that she has never smoked. She does not have any smokeless tobacco history on file. She reports that she does not drink alcohol or use illicit drugs.  No Known Allergies  Family History  Problem Relation Age of Onset  . Hypertension Mother   . Hypertension Father      Prior to Admission medications   Medication Sig Start Date End Date Taking? Authorizing Provider  amLODipine (NORVASC) 10 MG tablet Take 1 tablet (10 mg total) by mouth daily. 05/20/15  Yes Lance Bosch, NP  atorvastatin (LIPITOR) 10 MG tablet TAKE ONE TABLET BY MOUTH ONCE DAILY FOR CHOLESTEROL 11/03/15  Yes Lance Bosch, NP    Physical Exam: Filed Vitals:   01/01/16 1841 01/01/16 1934 01/01/16 2000 01/01/16 2030  BP: 137/89  154/106 119/70  Pulse: 130  128 106  Temp: 99.5 F (37.5 C) 98.9 F (37.2 C)    TempSrc: Oral Rectal     Resp: 22  28 18   SpO2: 98%  100% 98%      Constitutional: NAD, calm, comfortable Filed Vitals:   01/01/16 1841 01/01/16 1934 01/01/16 2000 01/01/16 2030  BP: 137/89  154/106 119/70  Pulse: 130  128 106  Temp: 99.5 F (37.5 C) 98.9 F (37.2 C)    TempSrc: Oral Rectal    Resp: 22  28 18   SpO2: 98%  100% 98%   Eyes: PERRL, lids and conjunctivae normal ENMT: Mucous membranes are moist. Posterior pharynx clear of any exudate or lesions.Normal dentition.  Neck: normal, supple, no masses, no thyromegaly Respiratory: clear to auscultation bilaterally, no wheezing, no crackles. Normal respiratory effort. No accessory muscle use.  Cardiovascular: Regular rate and rhythm, no murmurs / rubs / gallops. No extremity edema. 2+ pedal pulses. No carotid bruits.  Abdomen: no tenderness, no masses palpated. No hepatosplenomegaly. Bowel sounds positive.  Musculoskeletal: no clubbing / cyanosis. No joint deformity upper and lower extremities. Good ROM, no contractures. Normal muscle tone.  Skin: no rashes, lesions, ulcers. No induration Neurologic: CN 2-12 grossly intact. Sensation intact, DTR normal. Strength 5/5 in all 4.  Psychiatric: Normal judgment and insight. Alert and oriented x 3. Normal mood.    Labs on Admission: I have personally reviewed following labs and imaging studies  CBC:  Recent Labs Lab 01/01/16 1959  WBC 16.1*  NEUTROABS 10.4*  HGB 10.1*  HCT 31.7*  MCV 70.4*  PLT 0000000*   Basic Metabolic Panel:  Recent Labs Lab 01/01/16 1959  NA 138  K 3.5  CL 101  CO2 24  GLUCOSE 116*  BUN 15  CREATININE 1.12*  CALCIUM 9.4   GFR: CrCl cannot be calculated (Unknown ideal weight.). Liver Function Tests:  Recent Labs Lab 01/01/16 1959  AST 54*  ALT 36  ALKPHOS 73  BILITOT 0.6  PROT 8.8*  ALBUMIN 4.0   No results for input(s): LIPASE, AMYLASE in the last 168 hours. No results for input(s): AMMONIA in the last 168 hours. Coagulation Profile: No results for  input(s): INR, PROTIME in the last 168 hours. Cardiac Enzymes: No results for input(s): CKTOTAL, CKMB, CKMBINDEX, TROPONINI in the last 168 hours. BNP (last 3 results) No results for input(s): PROBNP in the last 8760 hours. HbA1C: No results for input(s): HGBA1C in the last 72 hours. CBG: No results for input(s): GLUCAP in the last 168 hours. Lipid Profile: No results for input(s): CHOL, HDL, LDLCALC, TRIG, CHOLHDL, LDLDIRECT in the last 72 hours. Thyroid Function Tests: No results for input(s): TSH, T4TOTAL, FREET4, T3FREE, THYROIDAB in the last 72 hours. Anemia Panel: No results for input(s): VITAMINB12, FOLATE, FERRITIN, TIBC, IRON, RETICCTPCT in the last 72 hours. Urine analysis:    Component Value Date/Time   COLORURINE YELLOW 01/01/2016 1925   APPEARANCEUR TURBID* 01/01/2016 1925   LABSPEC 1.013 01/01/2016 1925   PHURINE 6.5 01/01/2016 1925   GLUCOSEU NEGATIVE 01/01/2016 1925   HGBUR NEGATIVE 01/01/2016 1925   BILIRUBINUR NEGATIVE 01/01/2016 1925   BILIRUBINUR neg 09/11/2014 1546   KETONESUR NEGATIVE 01/01/2016 1925   PROTEINUR 30* 01/01/2016 1925   PROTEINUR 30 09/11/2014 1546   UROBILINOGEN 1.0 02/09/2015 2051   UROBILINOGEN 0.2 09/11/2014 1546   NITRITE NEGATIVE 01/01/2016 1925   NITRITE neg 09/11/2014 1546   LEUKOCYTESUR LARGE* 01/01/2016 1925   Sepsis Labs: @LABRCNTIP (procalcitonin:4,lacticidven:4) )No results found for this or any previous visit (from the past 240 hour(s)).   Radiological Exams on Admission: Dg Chest 2 View  01/01/2016  CLINICAL DATA:  Rule out pneumonia. Increasing weakness. Tachypnea and low-grade fever. EXAM: CHEST  2 VIEW COMPARISON:  07/31/2015 FINDINGS: Mild cardiomegaly is stable. Aortic tortuosity accentuated by rotation. There is no edema, consolidation, effusion, or pneumothorax. Right diaphragm eventration. Prominent stool in the upper abdomen, also seen previously. Thoracic dextroscoliosis. IMPRESSION: Negative for pneumonia.  Electronically Signed   By: Monte Fantasia M.D.   On: 01/01/2016 19:53    EKG: Independently reviewed.  Assessment/Plan Principal Problem:   Sepsis secondary to UTI [facility]) Active Problems:   Essential hypertension    Sepsis secondary to UTI -  1.5 L bolus in ED  125 cc/hr there after  Rocephin  Urine culture pending, ABX already in so BCx not ordered  Repeat CBC/BMP in AM  Tele monitor for tachycardia HTN - holding home BP meds for now, BP is on the lower side of normal in the ED running 0000000 systolic. Suspected chronic dementia -  Per husband patient has been having poor PO intake and minimally verbal for "quite some time" now.  This is supported by recent PCP notes, her PCP in November notes that patient "speaks in monosyllables".     DVT prophylaxis: Lovenox Code Status: DNR Family Communication: Husband at bedside Consults called: None Admission status: Admit to inpatient   Etta Quill DO [facility] Pager [phone] from 7PM-7AM  If 7AM-7PM, please contact the day physician for  the patient [email or web address] Password [facility]  01/01/2016, 9:42 PM

## 2016-01-01 NOTE — ED Provider Notes (Signed)
CSN: [id number]     Arrival date & time 01/01/16  1824 History   First MD Initiated Contact with Patient 01/01/16 1854     Chief Complaint  Patient presents with  . Weakness  . Suspect Sepsis       Patient is a 75 y.o. female presenting with weakness. The history is provided by the patient and the spouse.  Weakness This is a new problem. Pertinent negatives include no chest pain.  Patient presents with generalized weakness. Reportedly has been doing worse the last couple weeks. Eating less. They've had some change in her urination. No nausea vomiting diarrhea. Has had subjective fevers at home.  Past Medical History  Diagnosis Date  . Coronary artery disease     Patient denies  . Hypertension   . CKD (chronic kidney disease) stage 3, GFR 30-59 ml/min 07/31/2015  . Hyperlipidemia    Past Surgical History  Procedure Laterality Date  . Coronary angioplasty with stent placement     Family History  Problem Relation Age of Onset  . Hypertension Mother   . Hypertension Father    Social History  Substance Use Topics  . Smoking status: Never Smoker   . Smokeless tobacco: None  . Alcohol Use: No   OB History    No data available     Review of Systems  Constitutional: Positive for fever, appetite change and fatigue.  Cardiovascular: Negative for chest pain.  Musculoskeletal: Negative for myalgias.  Skin: Negative for wound.  Neurological: Positive for weakness. Negative for seizures.      Allergies  Review of patient's allergies indicates no known allergies.  Home Medications   Prior to Admission medications   Medication Sig Start Date End Date Taking? Authorizing Provider  amLODipine (NORVASC) 10 MG tablet Take 1 tablet (10 mg total) by mouth daily. 05/20/15  Yes Lance Bosch, NP  atorvastatin (LIPITOR) 10 MG tablet TAKE ONE TABLET BY MOUTH ONCE DAILY FOR CHOLESTEROL 11/03/15  Yes Lance Bosch, NP   BP 140/78 mmHg  Pulse 93  Temp(Src) 98.3 F (36.8 C) (Oral)   Resp 20  Ht 5\' 6"  (1.676 m)  Wt 100 lb 8.5 oz (45.6 kg)  BMI 16.23 kg/m2  SpO2 100% Physical Exam  Constitutional: She appears well-developed.  HENT:  Head: Atraumatic.  Cardiovascular: Normal rate.   Pulmonary/Chest: Effort normal.  Abdominal: There is no tenderness.  Neurological: She is alert.  Slightly slow to answer but appropriate.  Skin: Skin is warm.    ED Course  Procedures (including critical care time) Labs Review Labs Reviewed  COMPREHENSIVE METABOLIC PANEL - Abnormal; Notable for the following:    Glucose, Bld 116 (*)    Creatinine, Ser 1.12 (*)    Total Protein 8.8 (*)    AST 54 (*)    GFR calc non Af Amer 48 (*)    GFR calc Af Amer 55 (*)    All other components within normal limits  CBC WITH DIFFERENTIAL/PLATELET - Abnormal; Notable for the following:    WBC 16.1 (*)    Hemoglobin 10.1 (*)    HCT 31.7 (*)    MCV 70.4 (*)    MCH 22.4 (*)    RDW 28.7 (*)    Platelets 847 (*)    Neutro Abs 10.4 (*)    Monocytes Absolute 2.3 (*)    Basophils Absolute 0.3 (*)    All other components within normal limits  URINALYSIS, ROUTINE W REFLEX MICROSCOPIC (NOT AT [facility]) - Abnormal;  Notable for the following:    APPearance TURBID (*)    Protein, ur 30 (*)    Leukocytes, UA LARGE (*)    All other components within normal limits  URINE MICROSCOPIC-ADD ON - Abnormal; Notable for the following:    Squamous Epithelial / LPF 0-5 (*)    Bacteria, UA MANY (*)    All other components within normal limits  CBC - Abnormal; Notable for the following:    WBC 14.4 (*)    RBC 3.40 (*)    Hemoglobin 7.8 (*)    HCT 24.5 (*)    MCV 72.1 (*)    MCH 22.9 (*)    RDW 29.2 (*)    Platelets 740 (*)    All other components within normal limits  BASIC METABOLIC PANEL - Abnormal; Notable for the following:    Glucose, Bld 104 (*)    Calcium 8.4 (*)    GFR calc non Af Amer 55 (*)    All other components within normal limits  RETICULOCYTES - Abnormal; Notable for the following:     RBC. 3.64 (*)    All other components within normal limits  CBC - Abnormal; Notable for the following:    WBC 11.7 (*)    RBC 3.64 (*)    Hemoglobin 8.3 (*)    HCT 26.4 (*)    MCV 72.5 (*)    MCH 22.8 (*)    RDW 29.2 (*)    Platelets 695 (*)    All other components within normal limits  BASIC METABOLIC PANEL - Abnormal; Notable for the following:    Creatinine, Ser 1.03 (*)    Calcium 8.5 (*)    GFR calc non Af Amer 53 (*)    All other components within normal limits  URINE CULTURE  OCCULT BLOOD X 1 CARD TO LAB, STOOL  VITAMIN B12  FOLATE  IRON AND TIBC  FERRITIN  I-STAT CG4 LACTIC ACID, ED    Imaging Review Dg Chest 2 View  01/01/2016  CLINICAL DATA:  Rule out pneumonia. Increasing weakness. Tachypnea and low-grade fever. EXAM: CHEST  2 VIEW COMPARISON:  07/31/2015 FINDINGS: Mild cardiomegaly is stable. Aortic tortuosity accentuated by rotation. There is no edema, consolidation, effusion, or pneumothorax. Right diaphragm eventration. Prominent stool in the upper abdomen, also seen previously. Thoracic dextroscoliosis. IMPRESSION: Negative for pneumonia. Electronically Signed   By: Monte Fantasia M.D.   On: 01/01/2016 19:53   I have personally reviewed and evaluated these images and lab results as part of my medical decision-making.   EKG Interpretation   Date/Time:  Thursday January 01 2016 19:26:47 EDT Ventricular Rate:  123 PR Interval:  157 QRS Duration: 100 QT Interval:  355 QTC Calculation: 508 R Axis:   47 Text Interpretation:  Sinus tachycardia Atrial premature complexes  Probable left ventricular hypertrophy Anterior Q waves, possibly due to  LVH Prolonged QT interval SINCE LAST TRACING HEART RATE HAS INCREASED  Confirmed by Alvino Chapel  MD, Ovid Curd [phone]) on 01/01/2016 8:07:54 PM Also  confirmed by Alvino Chapel  MD, Winnona [place] [phone]), editor [place]  CCT,  [place] ([id number])  on 01/02/2016 7:33:35 AM      MDM   Final diagnoses:  None    Patient with weakness.  Found to have UTI. Will admit to internal medicine.    Davonna Belling, MD 01/03/16 [phone]

## 2016-01-01 NOTE — ED Notes (Signed)
Below orders not completed by EW. 

## 2016-01-01 NOTE — ED Notes (Signed)
Bed: WA19 Expected date:  Expected time:  Means of arrival:  Comments: Triage  

## 2016-01-02 DIAGNOSIS — I10 Essential (primary) hypertension: Secondary | ICD-10-CM

## 2016-01-02 DIAGNOSIS — A419 Sepsis, unspecified organism: Secondary | ICD-10-CM

## 2016-01-02 DIAGNOSIS — N390 Urinary tract infection, site not specified: Secondary | ICD-10-CM

## 2016-01-02 DIAGNOSIS — D473 Essential (hemorrhagic) thrombocythemia: Secondary | ICD-10-CM

## 2016-01-02 DIAGNOSIS — D509 Iron deficiency anemia, unspecified: Secondary | ICD-10-CM

## 2016-01-02 LAB — CBC
HCT: 24.5 % — ABNORMAL LOW (ref 36.0–46.0)
HEMOGLOBIN: 7.8 g/dL — AB (ref 12.0–15.0)
MCH: 22.9 pg — ABNORMAL LOW (ref 26.0–34.0)
MCHC: 31.8 g/dL (ref 30.0–36.0)
MCV: 72.1 fL — ABNORMAL LOW (ref 78.0–100.0)
PLATELETS: 740 10*3/uL — AB (ref 150–400)
RBC: 3.4 MIL/uL — AB (ref 3.87–5.11)
RDW: 29.2 % — ABNORMAL HIGH (ref 11.5–15.5)
WBC: 14.4 10*3/uL — AB (ref 4.0–10.5)

## 2016-01-02 LAB — BASIC METABOLIC PANEL
ANION GAP: 10 (ref 5–15)
BUN: 12 mg/dL (ref 6–20)
CO2: 25 mmol/L (ref 22–32)
Calcium: 8.4 mg/dL — ABNORMAL LOW (ref 8.9–10.3)
Chloride: 108 mmol/L (ref 101–111)
Creatinine, Ser: 0.99 mg/dL (ref 0.44–1.00)
GFR calc Af Amer: >60 mL/min (ref >60)
GFR, EST NON AFRICAN AMERICAN: 55 mL/min — AB (ref >60)
Glucose, Bld: 104 mg/dL — ABNORMAL HIGH (ref 65–99)
POTASSIUM: 3.6 mmol/L (ref 3.5–5.1)
SODIUM: 143 mmol/L (ref 135–145)

## 2016-01-02 LAB — OCCULT BLOOD X 1 CARD TO LAB, STOOL: FECAL OCCULT BLD: NEGATIVE

## 2016-01-02 MED ORDER — BOOST / RESOURCE BREEZE PO LIQD
1.0000 | Freq: Two times a day (BID) | ORAL | Status: DC
  Administered 2016-01-02 – 2016-01-04 (×6): 1 via ORAL

## 2016-01-02 MED ORDER — ENSURE ENLIVE PO LIQD: 237.0000 mL | Freq: Two times a day (BID) | ORAL | Status: DC

## 2016-01-02 MED ORDER — ENSURE ENLIVE PO LIQD
237.0000 mL | ORAL | Status: DC
  Administered 2016-01-03 – 2016-01-05 (×3): 237 mL via ORAL

## 2016-01-02 NOTE — Care Management Note (Signed)
Case Management Note  Patient Details  Name: Brytani Dohn MRN: [id number] Date of Birth: [birth year]  Subjective/Objective: 75 y/o f admitted w/Sepsis. From home.                   Action/Plan:d/c plan home.   Expected Discharge Date:   (unknown)               Expected Discharge Plan:  Home/Self Care  In-House Referral:     Discharge planning Services  CM Consult  Post Acute Care Choice:    Choice offered to:     DME Arranged:    DME Agency:     HH Arranged:    HH Agency:     Status of Service:  In process, will continue to follow  Medicare Important Message Given:    Date Medicare IM Given:    Medicare IM give by:    Date Additional Medicare IM Given:    Additional Medicare Important Message give by:     If discussed at [place] of Stay Meetings, dates discussed:    Additional Comments:  Dessa Phi, RN 01/02/2016, 4:20 PM

## 2016-01-02 NOTE — Progress Notes (Signed)
Initial Nutrition Assessment  DOCUMENTATION CODES:   Severe malnutrition in context of chronic illness, Underweight  INTERVENTION:  - Will decrease Ensure Enlive ton once/day, this supplement provides 350 kcal and 20 grams of protein - Will order Boost Breeze BID, each supplement provides 250 kcal and 9 grams of protein - Encourage PO intakes of meals and supplements - RD will continue to monitor for additional needs  NUTRITION DIAGNOSIS:   Inadequate oral intake related to poor appetite as evidenced by per patient/family report, meal completion < 25%.  GOAL:   Patient will meet greater than or equal to 90% of their needs  MONITOR:   PO intake, Supplement acceptance, Weight trends, Labs, Skin, I & O's  REASON FOR ASSESSMENT:   Malnutrition Screening Tool  ASSESSMENT:   75 y.o. female with medical history significant of UTIs frequently in past, HTN, patient presents to the ED with c/o generalized weakness worse over the past couple of weeks. Decreased PO intake. Family has noted some change in urination.  Pt seen for MST. BMI indicates underweight. Per chart review, pt consumed 25% of breakfast which pt reports was oatmeal, orange juice, and coffee. Pt reports that she has had ongoing poor appetite for several weeks PTA with no associated N/V or abdominal pain. She denies these symptoms after breakfast this AM. Pt states that PTA she would eat at least once a day, around 1300 or 1330, when her son would visit and bring food for her. Noted pt without teeth and she denies having dentures. Pt reports that she does need softer foods; at times she is able to chew meat and at other times she is unable. Pt denies swallowing difficulties.   She states that PTA she was drinking Ensure each morning and enjoys this supplement but that it caused constipation. Pt would like to continue receiving Ensure during hospitalization. Talked with her about Boost Breeze and she states she received this  supplement on a previous admission and enjoyed it; will order BID.   Physical assessment shows severe muscle wasting and severe fat wasting to upper body, mild muscle wasting to lower body, no edema. Pt unable to report on weight hx PTA. Per chart review, pt weight has been stable (100-110 lbs) for 1 year and 9 months. Most recently, pt has lost 7 lbs (6.5% body weight) in the past 5 months which is not significant for time frame.   Pt not meeting needs. Medications reviewed. IVF: NS @ 125 mL/hr. Labs reviewed; Ca: 8.4 mg/dL, GFR: 55.   Diet Order:  Diet Heart Room service appropriate?: Yes; Fluid consistency:: Thin  Skin:  Wound (see comment) (Stage 2 sacral pressure injury)  Last BM:  4/21  Height:   Ht Readings from Last 1 Encounters:  01/01/16 5\' 6"  (1.676 m)    Weight:   Wt Readings from Last 1 Encounters:  01/01/16 100 lb 8.5 oz (45.6 kg)    Ideal Body Weight:  59.09 kg (kg)  BMI:  Body mass index is 16.23 kg/(m^2).  Estimated Nutritional Needs:   Kcal:  1275-1460 (28-32 kcal/kg)  Protein:  50-60 grams  Fluid:  >/= 1.5 L/day  EDUCATION NEEDS:   No education needs identified at this time     Jarome Matin, RD, LDN Inpatient Clinical Dietitian Pager # [phone] After hours/weekend pager # [phone]

## 2016-01-02 NOTE — Progress Notes (Signed)
PROGRESS NOTE    Ashlee Mack  [id number] DOB: 04-Apr-1941 DOA: 01/01/2016  PCP: Lance Bosch, NP    Brief Narrative:  75 y/o female with HTN and chronic thrombocytosis who lives at home is brought in by her family for severe weakness for past few weeks. She is found to have a UTI with tachycardia (130s) and leukocytosis (16).    Assessment & Plan:   Principal Problem:   Sepsis secondary to UTI   - tachycardia improved, WBC count (with band on differential) down to only 14- cont Rocephin- f/u U culture  Active Problems:   Essential hypertension - hold Norvasc as she is slightly hypotensive  Anemia - ? Iron deficiency as she is in Fe Sulfate at home- Hb drop from 10 to 7.8- baseline is around 8 therfore Hb of 10 on admission was either due to hemoconcentration or an error - check anemia panel- FOB negative  Thrombocytosis - appears to date back to 2015 at least- no lab work prior to this in epic- large platelets noted on smear    DVT prophylaxis: Lovenox Code Status: DNR Family Communication: husband at bedside Disposition Plan: home in 1-2 days- f/u PT eval   Consultants:   none  Procedures:   none  Antimicrobials:  Anti-infectives    Start     Dose/Rate Route Frequency Ordered Stop   01/02/16 2015  cefTRIAXone (ROCEPHIN) 1 g in dextrose 5 % 50 mL IVPB     1 g 100 mL/hr over 30 Minutes Intravenous Every 24 hours 01/01/16 2125     01/01/16 2015  cefTRIAXone (ROCEPHIN) 1 g in dextrose 5 % 50 mL IVPB     1 g 100 mL/hr over 30 Minutes Intravenous  Once 01/01/16 2014 01/01/16 2146      Subjective: Admits to frequent urination. No dysuria or suprapubic pain. Still feels weak. Appetite poor.   Objective: Filed Vitals:   01/01/16 2344 01/01/16 2352 01/02/16 0541 01/02/16 1445  BP: 140/87  118/66 139/71  Pulse:  127 83 77  Temp: 99.3 F (37.4 C)  98.2 F (36.8 C) 99.2 F (37.3 C)  TempSrc: Oral  Oral Oral  Resp: 22 20 20 20   Height:  5\' 6"  (1.676 m)      Weight:  45.6 kg (100 lb 8.5 oz)    SpO2: 99%  99% 100%    Intake/Output Summary (Last 24 hours) at 01/02/16 1617 Last data filed at 01/02/16 0737  Gross per 24 hour  Intake 977.08 ml  Output      0 ml  Net 977.08 ml   Filed Weights   01/01/16 2202 01/01/16 2352  Weight: 48.535 kg (107 lb) 45.6 kg (100 lb 8.5 oz)    Examination: General exam: Appears calm and comfortable  Respiratory system: Clear to auscultation. Respiratory effort normal. Cardiovascular system: S1 & S2 heard, RRR. No JVD, murmurs, rubs, gallops or clicks. No pedal edema. Gastrointestinal system: Abdomen is nondistended, soft and nontender. No organomegaly or masses felt. Normal bowel sounds heard. Central nervous system: Alert and oriented. No focal neurological deficits. Extremities: Symmetric 5 x 5 power. Skin: No rashes, lesions or ulcers Psychiatry: Judgement and insight appear normal. Mood & affect appropriate.     Data Reviewed: I have personally reviewed following labs and imaging studies  CBC:  Recent Labs Lab 01/01/16 1959 01/02/16 0448  WBC 16.1* 14.4*  NEUTROABS 10.4*  --   HGB 10.1* 7.8*  HCT 31.7* 24.5*  MCV 70.4* 72.1*  PLT 847*  [id number]*   Basic Metabolic Panel:  Recent Labs Lab 01/01/16 1959 01/02/16 0448  NA 138 143  K 3.5 3.6  CL 101 108  CO2 24 25  GLUCOSE 116* 104*  BUN 15 12  CREATININE 1.12* 0.99  CALCIUM 9.4 8.4*   GFR: Estimated Creatinine Clearance: 36.4 mL/min (by C-G formula based on Cr of 0.99). Liver Function Tests:  Recent Labs Lab 01/01/16 1959  AST 54*  ALT 36  ALKPHOS 73  BILITOT 0.6  PROT 8.8*  ALBUMIN 4.0   No results for input(s): LIPASE, AMYLASE in the last 168 hours. No results for input(s): AMMONIA in the last 168 hours. Coagulation Profile: No results for input(s): INR, PROTIME in the last 168 hours. Cardiac Enzymes: No results for input(s): CKTOTAL, CKMB, CKMBINDEX, TROPONINI in the last 168 hours. BNP (last 3 results) No results  for input(s): PROBNP in the last 8760 hours. HbA1C: No results for input(s): HGBA1C in the last 72 hours. CBG: No results for input(s): GLUCAP in the last 168 hours. Lipid Profile: No results for input(s): CHOL, HDL, LDLCALC, TRIG, CHOLHDL, LDLDIRECT in the last 72 hours. Thyroid Function Tests: No results for input(s): TSH, T4TOTAL, FREET4, T3FREE, THYROIDAB in the last 72 hours. Anemia Panel: No results for input(s): VITAMINB12, FOLATE, FERRITIN, TIBC, IRON, RETICCTPCT in the last 72 hours. Urine analysis:    Component Value Date/Time   COLORURINE YELLOW 01/01/2016 1925   APPEARANCEUR TURBID* 01/01/2016 1925   LABSPEC 1.013 01/01/2016 1925   PHURINE 6.5 01/01/2016 1925   GLUCOSEU NEGATIVE 01/01/2016 1925   HGBUR NEGATIVE 01/01/2016 1925   BILIRUBINUR NEGATIVE 01/01/2016 1925   BILIRUBINUR neg 09/11/2014 1546   KETONESUR NEGATIVE 01/01/2016 1925   PROTEINUR 30* 01/01/2016 1925   PROTEINUR 30 09/11/2014 1546   UROBILINOGEN 1.0 02/09/2015 2051   UROBILINOGEN 0.2 09/11/2014 1546   NITRITE NEGATIVE 01/01/2016 1925   NITRITE neg 09/11/2014 1546   LEUKOCYTESUR LARGE* 01/01/2016 1925   Sepsis Labs: @LABRCNTIP (procalcitonin:4,lacticidven:4)  )No results found for this or any previous visit (from the past 240 hour(s)).       Radiology Studies: Dg Chest 2 View  01/01/2016  CLINICAL DATA:  Rule out pneumonia. Increasing weakness. Tachypnea and low-grade fever. EXAM: CHEST  2 VIEW COMPARISON:  07/31/2015 FINDINGS: Mild cardiomegaly is stable. Aortic tortuosity accentuated by rotation. There is no edema, consolidation, effusion, or pneumothorax. Right diaphragm eventration. Prominent stool in the upper abdomen, also seen previously. Thoracic dextroscoliosis. IMPRESSION: Negative for pneumonia. Electronically Signed   By: Monte Fantasia M.D.   On: 01/01/2016 19:53        Scheduled Meds: . atorvastatin  10 mg Oral q1800  . cefTRIAXone (ROCEPHIN)  IV  1 g Intravenous Q24H  .  enoxaparin (LOVENOX) injection  40 mg Subcutaneous QHS  . feeding supplement  1 Container Oral BID BM  . [START ON 01/03/2016] feeding supplement (ENSURE ENLIVE)  237 mL Oral Q24H   Continuous Infusions: . sodium chloride 125 mL/hr at 01/02/16 1407     LOS: 1 day    Time spent in minutes: 81    [place], MD [facility] Pager: [email or web address] Password [facility] 01/02/2016, 4:17 PM

## 2016-01-03 DIAGNOSIS — D638 Anemia in other chronic diseases classified elsewhere: Secondary | ICD-10-CM

## 2016-01-03 LAB — CBC
HCT: 26.4 % — ABNORMAL LOW (ref 36.0–46.0)
HEMOGLOBIN: 8.3 g/dL — AB (ref 12.0–15.0)
MCH: 22.8 pg — AB (ref 26.0–34.0)
MCHC: 31.4 g/dL (ref 30.0–36.0)
MCV: 72.5 fL — ABNORMAL LOW (ref 78.0–100.0)
Platelets: 695 10*3/uL — ABNORMAL HIGH (ref 150–400)
RBC: 3.64 MIL/uL — AB (ref 3.87–5.11)
RDW: 29.2 % — ABNORMAL HIGH (ref 11.5–15.5)
WBC: 11.7 10*3/uL — AB (ref 4.0–10.5)

## 2016-01-03 LAB — IRON AND TIBC
Iron: 42 ug/dL (ref 28–170)
SATURATION RATIOS: 23 % (ref 10.4–31.8)
TIBC: 179 ug/dL — ABNORMAL LOW (ref 250–450)
UIBC: 137 ug/dL

## 2016-01-03 LAB — RETICULOCYTES
RBC.: 3.64 MIL/uL — AB (ref 3.87–5.11)
RETIC COUNT ABSOLUTE: 69.2 10*3/uL (ref 19.0–186.0)
Retic Ct Pct: 1.9 % (ref 0.4–3.1)

## 2016-01-03 LAB — BASIC METABOLIC PANEL
ANION GAP: 11 (ref 5–15)
BUN: 12 mg/dL (ref 6–20)
CHLORIDE: 108 mmol/L (ref 101–111)
CO2: 24 mmol/L (ref 22–32)
Calcium: 8.5 mg/dL — ABNORMAL LOW (ref 8.9–10.3)
Creatinine, Ser: 1.03 mg/dL — ABNORMAL HIGH (ref 0.44–1.00)
GFR calc non Af Amer: 53 mL/min — ABNORMAL LOW (ref >60)
Glucose, Bld: 90 mg/dL (ref 65–99)
POTASSIUM: 3.5 mmol/L (ref 3.5–5.1)
SODIUM: 143 mmol/L (ref 135–145)

## 2016-01-03 LAB — FOLATE: FOLATE: 22.6 ng/mL (ref >5.9)

## 2016-01-03 LAB — FERRITIN: Ferritin: 322 ng/mL — ABNORMAL HIGH (ref 11–307)

## 2016-01-03 LAB — VITAMIN B12: VITAMIN B 12: 382 pg/mL (ref 180–914)

## 2016-01-03 MED ORDER — CEFPODOXIME PROXETIL 200 MG PO TABS
200.0000 mg | ORAL_TABLET | Freq: Two times a day (BID) | ORAL | Status: DC
  Administered 2016-01-03 – 2016-01-05 (×5): 200 mg via ORAL
  Filled 2016-01-03 (×6): qty 1

## 2016-01-03 NOTE — Evaluation (Signed)
Physical Therapy Evaluation Patient Details Name: Ashlee Mack MRN: [id number] DOB: July 16, 1941 Today's Date: 01/03/2016   History of Present Illness  75 y/o female with HTN and chronic thrombocytosis who lives at home is brought in by her family for severe weakness for past few weeks. She is found to have a UTI with tachycardia (130s) and leukocytosis (16).  Clinical Impression  Pt with decreased strength and activity tolerance  To benefit from continued PT to increase these to increase mobility and safety for home. Pt states she ws active with [facility] services (PT and RN ) however they stopped due to only so many visits. Highly recommend if pt goes home to continue with these services including [place] as well.     Follow Up Recommendations Home health PT (also recommend HHOT as well )    Equipment Recommendations  None recommended by PT (pt has equipment )    Recommendations for Other Services       Precautions / Restrictions Precautions Precautions: Fall Precaution Comments: pt's husband stated that she almost fell in the parking lot last time leaving, kneeled on her knee and scraped it  Restrictions Weight Bearing Restrictions: No      Mobility  Bed Mobility Overal bed mobility: Needs Assistance Bed Mobility: Supine to Sit;Sit to Supine     Supine to sit: Min assist Sit to supine: Min assist   General bed mobility comments: verbal cues needed , increased time needed, and assist to scoot .   Transfers Overall transfer level: Needs assistance Equipment used: Rolling walker (2 wheeled) Transfers: Sit to/from Stand Sit to Stand: Min assist         General transfer comment: cues for hand placement and safety and  especially turn to sit was not controlled . due to cleaning and hygiene sevveral times, sit to stand perfromed 6 xs and static stands at times for 2 minutes. Pt fatigued at times.   Ambulation/Gait Ambulation/Gait assistance: Min assist Ambulation Distance (Feet):  15 Feet Assistive device: Rolling walker (2 wheeled) Gait Pattern/deviations: Step-through pattern;Decreased stride length Gait velocity: slow   General Gait Details: flexed ofrward posture , however able to straighten up with cues   Stairs            Wheelchair Mobility    Modified Rankin (Stroke Patients Only)       Balance                                             Pertinent Vitals/Pain Pain Assessment: No/denies pain    Home Living Family/patient expects to be discharged to:: Private residence Living Arrangements: Spouse/significant other Available Help at Discharge: Family (son somes by to bring food ) Type of Home: House Home Access: Level entry     Home Layout: One level Home Equipment: Walker - 2 wheels      Prior Function Level of Independence: Independent with assistive device(s)         Comments: pt very soft spoken and husband speaking for her. States she sleeps in teh recliner, and uses the RW at home .Marland Kitchen been limitied with activity, was doing better with [facility] services (PT and RN) however they stated they had to stop due to so many visits they could do (?)      Hand Dominance        Extremity/Trunk Assessment  Lower Extremity Assessment: Generalized weakness (overall 4-/5 and tight DF L greater than R )         Communication   Communication: Other (comment) (very soft spoken and slow to respond. 1-2 words usually)  Cognition Arousal/Alertness: Awake/alert Behavior During Therapy: WFL for tasks assessed/performed Overall Cognitive Status: Within Functional Limits for tasks assessed                      General Comments      Exercises Other Exercises Other Exercises: perfomed B LE ankle pumps with DF stretch, heels slides AAROM BLE all 10x       Assessment/Plan    PT Assessment Patient needs continued PT services  PT Diagnosis Generalized weakness;Difficulty walking   PT Problem  List Decreased strength;Decreased activity tolerance;Decreased mobility;Decreased balance  PT Treatment Interventions Gait training;Functional mobility training;Therapeutic activities;Patient/family education   PT Goals (Current goals can be found in the Care Plan section) Acute Rehab PT Goals Patient Stated Goal: I want to do what the DR thinks is right.  PT Goal Formulation: With patient/family Time For Goal Achievement: 01/17/16 Potential to Achieve Goals: Good    Frequency Min 3X/week   Barriers to discharge        Co-evaluation               End of Session Equipment Utilized During Treatment: Gait belt Activity Tolerance: Patient tolerated treatment well Patient left: in chair;with call bell/phone within reach;with chair alarm set Nurse Communication: Mobility status         Time: 1019-1105 PT Time Calculation (min) (ACUTE ONLY): 46 min   Charges:   PT Evaluation $PT Eval Low Complexity: 1 Procedure PT Treatments $Gait Training: 8-22 mins $Therapeutic Activity: 8-22 mins   PT G CodesClide Dales 01-05-2016, 11:25 AM  Clide Dales, PT Pager: [phone] 01-05-2016

## 2016-01-03 NOTE — Progress Notes (Signed)
Foam dressing removed from sacrum earlier today due to incontinence. Barrier cream applied.

## 2016-01-03 NOTE — Progress Notes (Signed)
PROGRESS NOTE    Ashlee Mack  [id number] DOB: 16-Oct-1940 DOA: 01/01/2016  PCP: Lance Bosch, NP    Brief Narrative:  75 y/o female with HTN and chronic thrombocytosis who lives at home is brought in by her family for severe weakness for past few weeks. She is found to have a UTI with tachycardia (130s) and leukocytosis (16).    Assessment & Plan:   Principal Problem:   Sepsis secondary to UTI   - chronic urinary incontinence and occasional bowel incontinence (of solid stool) - tachycardia improved, WBC count (with band on differential) down to 11 from 16 - change Rocephin to Vantin- f/u U culture which is still negative   Active Problems:   Essential hypertension - hold Norvasc as she is slightly hypotensive  Anemia - ? Iron deficiency as she is in Fe Sulfate at home- Hb drop from 10 to 7.8- baseline is around 8 therfore Hb of 10 on admission was either due to hemoconcentration or an error - anemia panel consistent with AOCD -- FOB negative  Thrombocytosis - appears to date back to 2015 at least- no lab work prior to this in epic- large platelets noted on smear    DVT prophylaxis: Lovenox Code Status: DNR Family Communication: husband at bedside Disposition Plan: home tomorrow with HHPT/ OT   Consultants:   none  Procedures:   none  Antimicrobials:  Anti-infectives    Start     Dose/Rate Route Frequency Ordered Stop   01/03/16 1000  cefpodoxime (VANTIN) tablet 200 mg     200 mg Oral Every 12 hours 01/03/16 0751     01/02/16 2015  cefTRIAXone (ROCEPHIN) 1 g in dextrose 5 % 50 mL IVPB  Status:  Discontinued     1 g 100 mL/hr over 30 Minutes Intravenous Every 24 hours 01/01/16 2125 01/03/16 0751   01/01/16 2015  cefTRIAXone (ROCEPHIN) 1 g in dextrose 5 % 50 mL IVPB     1 g 100 mL/hr over 30 Minutes Intravenous  Once 01/01/16 2014 01/01/16 2146      Subjective: Feels stronger. Has a small passage of solid stool and urine with walking with PT. She states  she wears a depends at home and this is normal for her.   Objective: Filed Vitals:   01/02/16 2137 01/03/16 0514 01/03/16 0800 01/03/16 1109  BP: 132/77 123/61 140/78   Pulse: 92 75 93 86  Temp: 99.3 F (37.4 C) 98.9 F (37.2 C) 98.3 F (36.8 C)   TempSrc: Oral Oral Oral   Resp: 20 20 20    Height:      Weight:      SpO2: 100% 99% 100% 100%    Intake/Output Summary (Last 24 hours) at 01/03/16 1214 Last data filed at 01/03/16 1111  Gross per 24 hour  Intake 2283.33 ml  Output      0 ml  Net 2283.33 ml   Filed Weights   01/01/16 2202 01/01/16 2352  Weight: 48.535 kg (107 lb) 45.6 kg (100 lb 8.5 oz)    Examination: General exam: Appears calm and comfortable  Respiratory system: Clear to auscultation. Respiratory effort normal. Cardiovascular system: S1 & S2 heard, RRR. No JVD, murmurs, rubs, gallops or clicks. No pedal edema. Gastrointestinal system: Abdomen is nondistended, soft and nontender. No organomegaly or masses felt. Normal bowel sounds heard. Central nervous system: Alert and oriented. No focal neurological deficits. Extremities: Symmetric 5 x 5 power. Skin: No rashes, lesions or ulcers Psychiatry: Judgement and insight appear  normal. Mood & affect appropriate.     Data Reviewed: I have personally reviewed following labs and imaging studies  CBC:  Recent Labs Lab 01/01/16 1959 01/02/16 0448 01/03/16 0459  WBC 16.1* 14.4* 11.7*  NEUTROABS 10.4*  --   --   HGB 10.1* 7.8* 8.3*  HCT 31.7* 24.5* 26.4*  MCV 70.4* 72.1* 72.5*  PLT 847* 740* Q000111Q*   Basic Metabolic Panel:  Recent Labs Lab 01/01/16 1959 01/02/16 0448 01/03/16 0459  NA 138 143 143  K 3.5 3.6 3.5  CL 101 108 108  CO2 24 25 24   GLUCOSE 116* 104* 90  BUN 15 12 12   CREATININE 1.12* 0.99 1.03*  CALCIUM 9.4 8.4* 8.5*   GFR: Estimated Creatinine Clearance: 35 mL/min (by C-G formula based on Cr of 1.03). Liver Function Tests:  Recent Labs Lab 01/01/16 1959  AST 54*  ALT 36    ALKPHOS 73  BILITOT 0.6  PROT 8.8*  ALBUMIN 4.0   No results for input(s): LIPASE, AMYLASE in the last 168 hours. No results for input(s): AMMONIA in the last 168 hours. Coagulation Profile: No results for input(s): INR, PROTIME in the last 168 hours. Cardiac Enzymes: No results for input(s): CKTOTAL, CKMB, CKMBINDEX, TROPONINI in the last 168 hours. BNP (last 3 results) No results for input(s): PROBNP in the last 8760 hours. HbA1C: No results for input(s): HGBA1C in the last 72 hours. CBG: No results for input(s): GLUCAP in the last 168 hours. Lipid Profile: No results for input(s): CHOL, HDL, LDLCALC, TRIG, CHOLHDL, LDLDIRECT in the last 72 hours. Thyroid Function Tests: No results for input(s): TSH, T4TOTAL, FREET4, T3FREE, THYROIDAB in the last 72 hours. Anemia Panel:  Recent Labs  01/03/16 0459  VITAMINB12 382  FOLATE 22.6  FERRITIN 322*  TIBC 179*  IRON 42  RETICCTPCT 1.9   Urine analysis:    Component Value Date/Time   COLORURINE YELLOW 01/01/2016 1925   APPEARANCEUR TURBID* 01/01/2016 1925   LABSPEC 1.013 01/01/2016 1925   PHURINE 6.5 01/01/2016 1925   GLUCOSEU NEGATIVE 01/01/2016 1925   HGBUR NEGATIVE 01/01/2016 1925   BILIRUBINUR NEGATIVE 01/01/2016 1925   BILIRUBINUR neg 09/11/2014 1546   KETONESUR NEGATIVE 01/01/2016 1925   PROTEINUR 30* 01/01/2016 1925   PROTEINUR 30 09/11/2014 1546   UROBILINOGEN 1.0 02/09/2015 2051   UROBILINOGEN 0.2 09/11/2014 1546   NITRITE NEGATIVE 01/01/2016 1925   NITRITE neg 09/11/2014 1546   LEUKOCYTESUR LARGE* 01/01/2016 1925   Sepsis Labs: @LABRCNTIP (procalcitonin:4,lacticidven:4)  )No results found for this or any previous visit (from the past 240 hour(s)).       Radiology Studies: Dg Chest 2 View  01/01/2016  CLINICAL DATA:  Rule out pneumonia. Increasing weakness. Tachypnea and low-grade fever. EXAM: CHEST  2 VIEW COMPARISON:  07/31/2015 FINDINGS: Mild cardiomegaly is stable. Aortic tortuosity accentuated by  rotation. There is no edema, consolidation, effusion, or pneumothorax. Right diaphragm eventration. Prominent stool in the upper abdomen, also seen previously. Thoracic dextroscoliosis. IMPRESSION: Negative for pneumonia. Electronically Signed   By: Monte Fantasia M.D.   On: 01/01/2016 19:53        Scheduled Meds: . atorvastatin  10 mg Oral q1800  . cefpodoxime  200 mg Oral Q12H  . enoxaparin (LOVENOX) injection  40 mg Subcutaneous QHS  . feeding supplement  1 Container Oral BID BM  . feeding supplement (ENSURE ENLIVE)  237 mL Oral Q24H   Continuous Infusions: . sodium chloride 125 mL/hr at 01/03/16 1003     LOS: 2 days  Time spent in minutes: 62    [place], MD [facility] Pager: [email or web address] Password [facility] 01/03/2016, 12:14 PM

## 2016-01-04 DIAGNOSIS — E440 Moderate protein-calorie malnutrition: Secondary | ICD-10-CM

## 2016-01-04 DIAGNOSIS — N183 Chronic kidney disease, stage 3 (moderate): Secondary | ICD-10-CM

## 2016-01-04 LAB — CBC
HCT: 24.8 % — ABNORMAL LOW (ref 36.0–46.0)
Hemoglobin: 7.7 g/dL — ABNORMAL LOW (ref 12.0–15.0)
MCH: 22.1 pg — ABNORMAL LOW (ref 26.0–34.0)
MCHC: 31 g/dL (ref 30.0–36.0)
MCV: 71.1 fL — ABNORMAL LOW (ref 78.0–100.0)
PLATELETS: 660 10*3/uL — AB (ref 150–400)
RBC: 3.49 MIL/uL — ABNORMAL LOW (ref 3.87–5.11)
RDW: 28.8 % — AB (ref 11.5–15.5)
WBC: 8.8 10*3/uL (ref 4.0–10.5)

## 2016-01-04 LAB — URINE CULTURE: Culture: >=100000 — AB

## 2016-01-04 LAB — PREPARE RBC (CROSSMATCH)

## 2016-01-04 MED ORDER — AMLODIPINE BESYLATE 10 MG PO TABS
10.0000 mg | ORAL_TABLET | Freq: Every day | ORAL | Status: DC
  Administered 2016-01-04 – 2016-01-05 (×2): 10 mg via ORAL
  Filled 2016-01-04 (×2): qty 1

## 2016-01-04 MED ORDER — SODIUM CHLORIDE 0.9 % IV SOLN
Freq: Once | INTRAVENOUS | Status: AC
  Administered 2016-01-04: 10:00:00 via INTRAVENOUS

## 2016-01-04 NOTE — Progress Notes (Signed)
PROGRESS NOTE    Ashlee Mack  [id number] DOB: May 27, 1941 DOA: 01/01/2016  PCP: Lance Bosch, NP    Brief Narrative:  75 y/o female with HTN and chronic thrombocytosis who lives at home is brought in by her family for severe weakness for past few weeks. She is found to have a UTI with tachycardia (130s) and leukocytosis (16).    Assessment & Plan:   Principal Problem:   Sepsis secondary to e coli UTI   - chronic urinary incontinence and occasional bowel incontinence (of solid stool) - tachycardia improved, WBC count (with band on differential) down to 8 from 16 - change Rocephin to Vantin-  U culture reveals E coli sensitive to current antibiotic- plan for 1 wk course for complicated UTI  Active Problems:   Essential hypertension - holding Norvasc as she is slightly hypotensive - now BP rising- resume Norvasc  Anemia - ? Iron deficiency as she is in Fe Sulfate at home- Hb drop from 10 to 7.8- baseline is around 8 therfore Hb of 10 on admission was either due to hemoconcentration or an error - anemia panel consistent with AOCD -- FOB negative - will need to transfuse 1 U PRBC due to Hb of 7 with severe weakness  Thrombocytosis - appears to date back to 2015 at least- no lab work prior to this in epic- large platelets noted on smear    DVT prophylaxis: Lovenox Code Status: DNR Family Communication: husband at bedside Disposition Plan: home tomorrow with HHPT/ OT   Consultants:   none  Procedures:   none  Antimicrobials:  Anti-infectives    Start     Dose/Rate Route Frequency Ordered Stop   01/03/16 1000  cefpodoxime (VANTIN) tablet 200 mg     200 mg Oral Every 12 hours 01/03/16 0751     01/02/16 2015  cefTRIAXone (ROCEPHIN) 1 g in dextrose 5 % 50 mL IVPB  Status:  Discontinued     1 g 100 mL/hr over 30 Minutes Intravenous Every 24 hours 01/01/16 2125 01/03/16 0751   01/01/16 2015  cefTRIAXone (ROCEPHIN) 1 g in dextrose 5 % 50 mL IVPB     1 g 100 mL/hr  over 30 Minutes Intravenous  Once 01/01/16 2014 01/01/16 2146      Subjective: Feels stronger. Has a small passage of solid stool and urine with walking with PT. She states she wears a depends at home and this is normal for her.   Objective: Filed Vitals:   01/03/16 1109 01/03/16 1515 01/03/16 2105 01/04/16 0558  BP:  125/63 134/67 144/71  Pulse: 86 77 68 68  Temp:  98.5 F (36.9 C) 98.8 F (37.1 C) 98.1 F (36.7 C)  TempSrc:  Oral Oral Oral  Resp:  19 20 18   Height:      Weight:      SpO2: 100% 100% 97% 97%    Intake/Output Summary (Last 24 hours) at 01/04/16 1222 Last data filed at 01/04/16 0534  Gross per 24 hour  Intake 4260.83 ml  Output      0 ml  Net 4260.83 ml   Filed Weights   01/01/16 2202 01/01/16 2352  Weight: 48.535 kg (107 lb) 45.6 kg (100 lb 8.5 oz)    Examination: General exam: Appears calm and comfortable  Respiratory system: Clear to auscultation. Respiratory effort normal. Cardiovascular system: S1 & S2 heard, RRR. No JVD, murmurs, rubs, gallops or clicks. No pedal edema. Gastrointestinal system: Abdomen is nondistended, soft and nontender. No organomegaly  or masses felt. Normal bowel sounds heard. Central nervous system: Alert and oriented. No focal neurological deficits. Extremities: Symmetric 5 x 5 power. Skin: No rashes, lesions or ulcers Psychiatry: Judgement and insight appear normal. Mood & affect appropriate.     Data Reviewed: I have personally reviewed following labs and imaging studies  CBC:  Recent Labs Lab 01/01/16 1959 01/02/16 0448 01/03/16 0459 01/04/16 0827  WBC 16.1* 14.4* 11.7* 8.8  NEUTROABS 10.4*  --   --   --   HGB 10.1* 7.8* 8.3* 7.7*  HCT 31.7* 24.5* 26.4* 24.8*  MCV 70.4* 72.1* 72.5* 71.1*  PLT 847* 740* 695* Q000111Q*   Basic Metabolic Panel:  Recent Labs Lab 01/01/16 1959 01/02/16 0448 01/03/16 0459  NA 138 143 143  K 3.5 3.6 3.5  CL 101 108 108  CO2 24 25 24   GLUCOSE 116* 104* 90  BUN 15 12 12     CREATININE 1.12* 0.99 1.03*  CALCIUM 9.4 8.4* 8.5*   GFR: Estimated Creatinine Clearance: 35 mL/min (by C-G formula based on Cr of 1.03). Liver Function Tests:  Recent Labs Lab 01/01/16 1959  AST 54*  ALT 36  ALKPHOS 73  BILITOT 0.6  PROT 8.8*  ALBUMIN 4.0   No results for input(s): LIPASE, AMYLASE in the last 168 hours. No results for input(s): AMMONIA in the last 168 hours. Coagulation Profile: No results for input(s): INR, PROTIME in the last 168 hours. Cardiac Enzymes: No results for input(s): CKTOTAL, CKMB, CKMBINDEX, TROPONINI in the last 168 hours. BNP (last 3 results) No results for input(s): PROBNP in the last 8760 hours. HbA1C: No results for input(s): HGBA1C in the last 72 hours. CBG: No results for input(s): GLUCAP in the last 168 hours. Lipid Profile: No results for input(s): CHOL, HDL, LDLCALC, TRIG, CHOLHDL, LDLDIRECT in the last 72 hours. Thyroid Function Tests: No results for input(s): TSH, T4TOTAL, FREET4, T3FREE, THYROIDAB in the last 72 hours. Anemia Panel:  Recent Labs  01/03/16 0459  VITAMINB12 382  FOLATE 22.6  FERRITIN 322*  TIBC 179*  IRON 42  RETICCTPCT 1.9   Urine analysis:    Component Value Date/Time   COLORURINE YELLOW 01/01/2016 1925   APPEARANCEUR TURBID* 01/01/2016 1925   LABSPEC 1.013 01/01/2016 1925   PHURINE 6.5 01/01/2016 1925   GLUCOSEU NEGATIVE 01/01/2016 1925   HGBUR NEGATIVE 01/01/2016 1925   BILIRUBINUR NEGATIVE 01/01/2016 1925   BILIRUBINUR neg 09/11/2014 1546   KETONESUR NEGATIVE 01/01/2016 1925   PROTEINUR 30* 01/01/2016 1925   PROTEINUR 30 09/11/2014 1546   UROBILINOGEN 1.0 02/09/2015 2051   UROBILINOGEN 0.2 09/11/2014 1546   NITRITE NEGATIVE 01/01/2016 1925   NITRITE neg 09/11/2014 1546   LEUKOCYTESUR LARGE* 01/01/2016 1925   Sepsis Labs: @LABRCNTIP (procalcitonin:4,lacticidven:4)  ) Recent Results (from the past 240 hour(s))  Urine culture     Status: Abnormal   Collection Time: 01/01/16  7:25 PM   Result Value Ref Range Status   Specimen Description URINE, CATHETERIZED  Final   Special Requests NONE  Final   Culture >=100,000 COLONIES/mL ESCHERICHIA COLI (A)  Final   Report Status 01/04/2016 FINAL  Final   Organism ID, Bacteria ESCHERICHIA COLI (A)  Final      Susceptibility   Escherichia coli - MIC*    AMPICILLIN >=32 RESISTANT Resistant     CEFAZOLIN <=4 SENSITIVE Sensitive     CEFTRIAXONE <=1 SENSITIVE Sensitive     CIPROFLOXACIN <=0.25 SENSITIVE Sensitive     GENTAMICIN <=1 SENSITIVE Sensitive     IMIPENEM <=  0.25 SENSITIVE Sensitive     NITROFURANTOIN <=16 SENSITIVE Sensitive     TRIMETH/SULFA <=20 SENSITIVE Sensitive     AMPICILLIN/SULBACTAM >=32 RESISTANT Resistant     PIP/TAZO <=4 SENSITIVE Sensitive     * >=100,000 COLONIES/mL ESCHERICHIA COLI         Radiology Studies: No results found.      Scheduled Meds: . sodium chloride   Intravenous Once  . atorvastatin  10 mg Oral q1800  . cefpodoxime  200 mg Oral Q12H  . enoxaparin (LOVENOX) injection  40 mg Subcutaneous QHS  . feeding supplement  1 Container Oral BID BM  . feeding supplement (ENSURE ENLIVE)  237 mL Oral Q24H   Continuous Infusions:     LOS: 3 days    Time spent in minutes: [place], MD [facility] Pager: [email or web address] Password [facility] 01/04/2016, 12:22 PM

## 2016-01-05 DIAGNOSIS — D638 Anemia in other chronic diseases classified elsewhere: Secondary | ICD-10-CM

## 2016-01-05 LAB — CBC
HEMATOCRIT: 29.3 % — AB (ref 36.0–46.0)
Hemoglobin: 9.4 g/dL — ABNORMAL LOW (ref 12.0–15.0)
MCH: 23.7 pg — ABNORMAL LOW (ref 26.0–34.0)
MCHC: 32.1 g/dL (ref 30.0–36.0)
MCV: 73.8 fL — ABNORMAL LOW (ref 78.0–100.0)
PLATELETS: 630 10*3/uL — AB (ref 150–400)
RBC: 3.97 MIL/uL (ref 3.87–5.11)
RDW: 28 % — AB (ref 11.5–15.5)
WBC: 9.8 10*3/uL (ref 4.0–10.5)

## 2016-01-05 LAB — TYPE AND SCREEN
ABO/RH(D): B POS
ANTIBODY SCREEN: NEGATIVE
Unit division: 0

## 2016-01-05 LAB — BASIC METABOLIC PANEL
ANION GAP: 12 (ref 5–15)
BUN: 11 mg/dL (ref 6–20)
CALCIUM: 8.9 mg/dL (ref 8.9–10.3)
CO2: 23 mmol/L (ref 22–32)
Chloride: 106 mmol/L (ref 101–111)
Creatinine, Ser: 1.03 mg/dL — ABNORMAL HIGH (ref 0.44–1.00)
GFR, EST NON AFRICAN AMERICAN: 53 mL/min — AB (ref >60)
Glucose, Bld: 106 mg/dL — ABNORMAL HIGH (ref 65–99)
Potassium: 3.6 mmol/L (ref 3.5–5.1)
Sodium: 141 mmol/L (ref 135–145)

## 2016-01-05 LAB — ABO/RH: ABO/RH(D): B POS

## 2016-01-05 MED ORDER — CEFPODOXIME PROXETIL 200 MG PO TABS: 200.0000 mg | ORAL_TABLET | Freq: Two times a day (BID) | ORAL | Status: DC

## 2016-01-05 MED ORDER — ENSURE ENLIVE PO LIQD: 237.0000 mL | ORAL | Status: DC

## 2016-01-05 MED ORDER — BOOST / RESOURCE BREEZE PO LIQD: 1.0000 | Freq: Two times a day (BID) | ORAL | Status: AC

## 2016-01-05 NOTE — Progress Notes (Signed)
Physical Therapy Treatment Patient Details Name: Tomasina Navia MRN: [id number] DOB: 03/29/1941 Today's Date: 01/05/2016    History of Present Illness 75 y/o female with HTN and chronic thrombocytosis who lives at home is brought in by her family for severe weakness for past few weeks. She is found to have a UTI with tachycardia (130s) and leukocytosis (16).    PT Comments    Progressing slowly with mobility. Pt participated well with session. Per pt and husband, they are discharging home on today. Recommend HHPT, [place], home health aide.   Follow Up Recommendations  Home health PT;Supervision/Assistance - 24 hour; Home Health OT; Home Health Aide     Equipment Recommendations  None recommended by PT    Recommendations for Other Services       Precautions / Restrictions Precautions Precautions: Fall Precaution Comments: pt's husband stated that she almost fell in the parking lot last time leaving, kneeled on her knee and scraped it  Restrictions Weight Bearing Restrictions: No    Mobility  Bed Mobility Overal bed mobility: Needs Assistance Bed Mobility: Supine to Sit     Supine to sit: Min guard     General bed mobility comments: Increased time needed. cues required as well  Transfers Overall transfer level: Needs assistance   Transfers: Sit to/from Stand;Stand Pivot Transfers Sit to Stand: Min assist Stand pivot transfers: Min assist       General transfer comment: Assist to rise, stabilize, control descent. Stand pivot, bed to bsc. Increased time.   Ambulation/Gait Ambulation/Gait assistance: Min assist;Min guard Ambulation Distance (Feet): 55 Feet Assistive device: Rolling walker (2 wheeled) Gait Pattern/deviations: Step-through pattern;Decreased stride length     General Gait Details: slow gait speed. Intermittent assist to steady but mostly close guard assist. pt fatigues fairly easily.    Stairs            Wheelchair Mobility    Modified  Rankin (Stroke Patients Only)       Balance Overall balance assessment: Needs assistance         Standing balance support: Bilateral upper extremity supported;During functional activity Standing balance-Leahy Scale: Poor Standing balance comment: requires RW use                    Cognition Arousal/Alertness: Awake/alert Behavior During Therapy: WFL for tasks assessed/performed Overall Cognitive Status: Within Functional Limits for tasks assessed                      Exercises      General Comments        Pertinent Vitals/Pain Pain Assessment: No/denies pain    Home Living                      Prior Function            PT Goals (current goals can now be found in the care plan section) Progress towards PT goals: Progressing toward goals    Frequency  Min 3X/week    PT Plan Current plan remains appropriate    Co-evaluation             End of Session Equipment Utilized During Treatment: Gait belt Activity Tolerance: Patient tolerated treatment well Patient left: in chair;with call bell/phone within reach;with chair alarm set;with family/visitor present     Time: 1000-1016 PT Time Calculation (min) (ACUTE ONLY): 16 min  Charges:  $Gait Training: 8-22 mins  G Codes:      Weston Anna, MPT Pager: [phone]

## 2016-01-05 NOTE — Discharge Summary (Addendum)
Physician Discharge Summary  Ashlee Mack [id number] DOB: Dec 27, 1940 DOA: 01/01/2016  PCP: Lance Bosch, NP  Admit date: 01/01/2016 Discharge date: 01/05/2016  Time spent: 50 minutes  Recommendations for Outpatient Follow-up:  1. Home with HHPT for generalized weakness 2. Urology referral for incontinence   Discharge Condition: 51    Discharge Diagnoses:  Principal Problem:   Sepsis secondary to UTI [facility]) Active Problems:   Essential hypertension   Malnutrition of moderate degree (HCC)   CKD (chronic kidney disease) stage 3, GFR 30-59 ml/min   Anemia of chronic disease  generalized weakness  History of present illness:  75 y/o female with HTN and chronic thrombocytosis who lives at home is brought in by her family for severe weakness for past few weeks. She is found to have a UTI with tachycardia (130s) and leukocytosis (16).    Hospital Course:  Principal Problem:  Sepsis secondary to e coli UTI  - chronic urinary incontinence and occasional bowel incontinence (of solid stool) - tachycardia improved, WBC count (with band on differential) down to 8 from 16 - changed Rocephin to Vantin- U culture reveals E coli sensitive to current antibiotic- plan for 1 wk course for complicated UTI  Active Problems: Urinary incontinence - have spoken with patient and husband about f/u with urology - they state that they will make the appt themselves - she is advised to change her pad frequently to prevent further UTIs   Essential hypertension - holding Norvasc as she is slightly hypotensive - now BP rising- resumed Norvasc  Anemia - ? Iron deficiency as she is in Fe S[place] at home- Hb drop from 10 to 7.8- baseline is around 8 therfore Hb of 10 on admission was either due to hemoconcentration or an error - anemia panel consistent with AOCD -- FOB negative -  transfused 1 U PRBC due to Hb of 7 with severe weakness- Hb up to 9.4 today  Generalized weakness -  HHPT  Thrombocytosis - appears to date back to 2015 at least- no lab work prior to this in epic- large platelets noted on smear  Moderate Protein calorie malnutrition - prescriptions given for Ensure and Boost  Procedures:  none  Consultations:  none  Discharge Exam: [facility] Weights   01/01/16 2202 01/01/16 2352  Weight: 48.535 kg (107 lb) 45.6 kg (100 lb 8.5 oz)   Filed Vitals:   01/04/16 2029 01/05/16 0618  BP: 141/70 130/66  Pulse: 70 63  Temp: 98.2 F (36.8 C) 98.6 F (37 C)  Resp: 20 18    General: AAO x 3, no distress Cardiovascular: RRR, no murmurs  Respiratory: clear to auscultation bilaterally GI: soft, non-tender, non-distended, bowel sound positive  Discharge Instructions You were cared for by a hospitalist during your hospital stay. If you have any questions about your discharge medications or the care you received while you were in the hospital after you are discharged, you can call the unit and asked to speak with the hospitalist on call if the hospitalist that took care of you is not available. Once you are discharged, your primary care physician will handle any further medical issues. Please note that NO REFILLS for any discharge medications will be authorized once you are discharged, as it is imperative that you return to your primary care physician (or establish a relationship with a primary care physician if you do not have one) for your aftercare needs so that they can reassess your need for medications and monitor your lab values.  Discharge Instructions    Diet - low sodium heart healthy    Complete by:  As directed      Increase activity slowly    Complete by:  As directed             Medication List    TAKE these medications        amLODipine 10 MG tablet  Commonly known as:  NORVASC  Take 1 tablet (10 mg total) by mouth daily.     atorvastatin 10 MG tablet  Commonly known as:  LIPITOR  TAKE ONE TABLET BY MOUTH ONCE DAILY FOR  CHOLESTEROL     cefpodoxime 200 MG tablet  Commonly known as:  VANTIN  Take 1 tablet (200 mg total) by mouth every 12 (twelve) hours.     feeding supplement Liqd  Take 1 Container by mouth 2 (two) times daily between meals.     feeding supplement (ENSURE ENLIVE) Liqd  Take 237 mLs by mouth daily.       No Known Allergies    The results of significant diagnostics from this hospitalization (including imaging, microbiology, ancillary and laboratory) are listed below for reference.    Significant Diagnostic Studies: Dg Chest 2 View  01/01/2016  CLINICAL DATA:  Rule out pneumonia. Increasing weakness. Tachypnea and low-grade fever. EXAM: CHEST  2 VIEW COMPARISON:  07/31/2015 FINDINGS: Mild cardiomegaly is stable. Aortic tortuosity accentuated by rotation. There is no edema, consolidation, effusion, or pneumothorax. Right diaphragm eventration. Prominent stool in the upper abdomen, also seen previously. Thoracic dextroscoliosis. IMPRESSION: Negative for pneumonia. Electronically Signed   By: Monte Fantasia M.D.   On: 01/01/2016 19:53    Microbiology: Recent Results (from the past 240 hour(s))  Urine culture     Status: Abnormal   Collection Time: 01/01/16  7:25 PM  Result Value Ref Range Status   Specimen Description URINE, CATHETERIZED  Final   Special Requests NONE  Final   Culture >=100,000 COLONIES/mL ESCHERICHIA COLI (A)  Final   Report Status 01/04/2016 FINAL  Final   Organism ID, Bacteria ESCHERICHIA COLI (A)  Final      Susceptibility   Escherichia coli - MIC*    AMPICILLIN >=32 RESISTANT Resistant     CEFAZOLIN <=4 SENSITIVE Sensitive     CEFTRIAXONE <=1 SENSITIVE Sensitive     CIPROFLOXACIN <=0.25 SENSITIVE Sensitive     GENTAMICIN <=1 SENSITIVE Sensitive     IMIPENEM <=0.25 SENSITIVE Sensitive     NITROFURANTOIN <=16 SENSITIVE Sensitive     TRIMETH/SULFA <=20 SENSITIVE Sensitive     AMPICILLIN/SULBACTAM >=32 RESISTANT Resistant     PIP/TAZO <=4 SENSITIVE Sensitive      * >=100,000 COLONIES/mL ESCHERICHIA COLI     Labs: Basic Metabolic Panel:  Recent Labs Lab 01/01/16 1959 01/02/16 0448 01/03/16 0459  NA 138 143 143  K 3.5 3.6 3.5  CL 101 108 108  CO2 24 25 24   GLUCOSE 116* 104* 90  BUN 15 12 12   CREATININE 1.12* 0.99 1.03*  CALCIUM 9.4 8.4* 8.5*   Liver Function Tests:  Recent Labs Lab 01/01/16 1959  AST 54*  ALT 36  ALKPHOS 73  BILITOT 0.6  PROT 8.8*  ALBUMIN 4.0   No results for input(s): LIPASE, AMYLASE in the last 168 hours. No results for input(s): AMMONIA in the last 168 hours. CBC:  Recent Labs Lab 01/01/16 1959 01/02/16 0448 01/03/16 0459 01/04/16 0827 01/05/16 0828  WBC 16.1* 14.4* 11.7* 8.8 9.8  NEUTROABS 10.4*  --   --   --   --  HGB 10.1* 7.8* 8.3* 7.7* 9.4*  HCT 31.7* 24.5* 26.4* 24.8* 29.3*  MCV 70.4* 72.1* 72.5* 71.1* 73.8*  PLT 847* 740* 695* 660* 630*   Cardiac Enzymes: No results for input(s): CKTOTAL, CKMB, CKMBINDEX, TROPONINI in the last 168 hours. BNP: BNP (last 3 results)  Recent Labs  02/09/15 2017  BNP 79.5    ProBNP (last 3 results) No results for input(s): PROBNP in the last 8760 hours.  CBG: No results for input(s): GLUCAP in the last 168 hours.     SignedDebbe Odea, MD [facility] 01/05/2016, 9:04 AM

## 2016-01-05 NOTE — Care Management Important Message (Signed)
Important Message  Patient Details  Name: Ashlee Mack MRN: [id number] Date of Birth: [birth year]   Medicare Important Message Given:  Yes    Camillo Flaming 01/05/2016, 10:45 AMImportant Message  Patient Details  Name: Ashlee Mack MRN: [id number] Date of Birth: [birth year]   Medicare Important Message Given:  Yes    Camillo Flaming 01/05/2016, 10:45 AM

## 2016-01-08 ENCOUNTER — Other Ambulatory Visit: Payer: Self-pay | Admitting: Internal Medicine

## 2016-02-13 ENCOUNTER — Other Ambulatory Visit: Payer: Self-pay | Admitting: Internal Medicine

## 2016-02-17 ENCOUNTER — Other Ambulatory Visit: Payer: Self-pay | Admitting: Pharmacist

## 2016-03-03 ENCOUNTER — Other Ambulatory Visit: Payer: Self-pay | Admitting: Internal Medicine

## 2016-03-23 ENCOUNTER — Other Ambulatory Visit: Payer: Self-pay | Admitting: Internal Medicine

## 2016-03-24 ENCOUNTER — Other Ambulatory Visit: Payer: Self-pay | Admitting: Internal Medicine

## 2016-04-20 ENCOUNTER — Other Ambulatory Visit: Payer: Self-pay | Admitting: Internal Medicine

## 2016-05-24 IMAGING — DX DG CHEST 1V PORT
1 series · 1 of 1 positions shown · non-contrast
Comparison: None.

CLINICAL DATA: Altered mental status

EXAM:
PORTABLE CHEST - 1 VIEW

[chest ap]
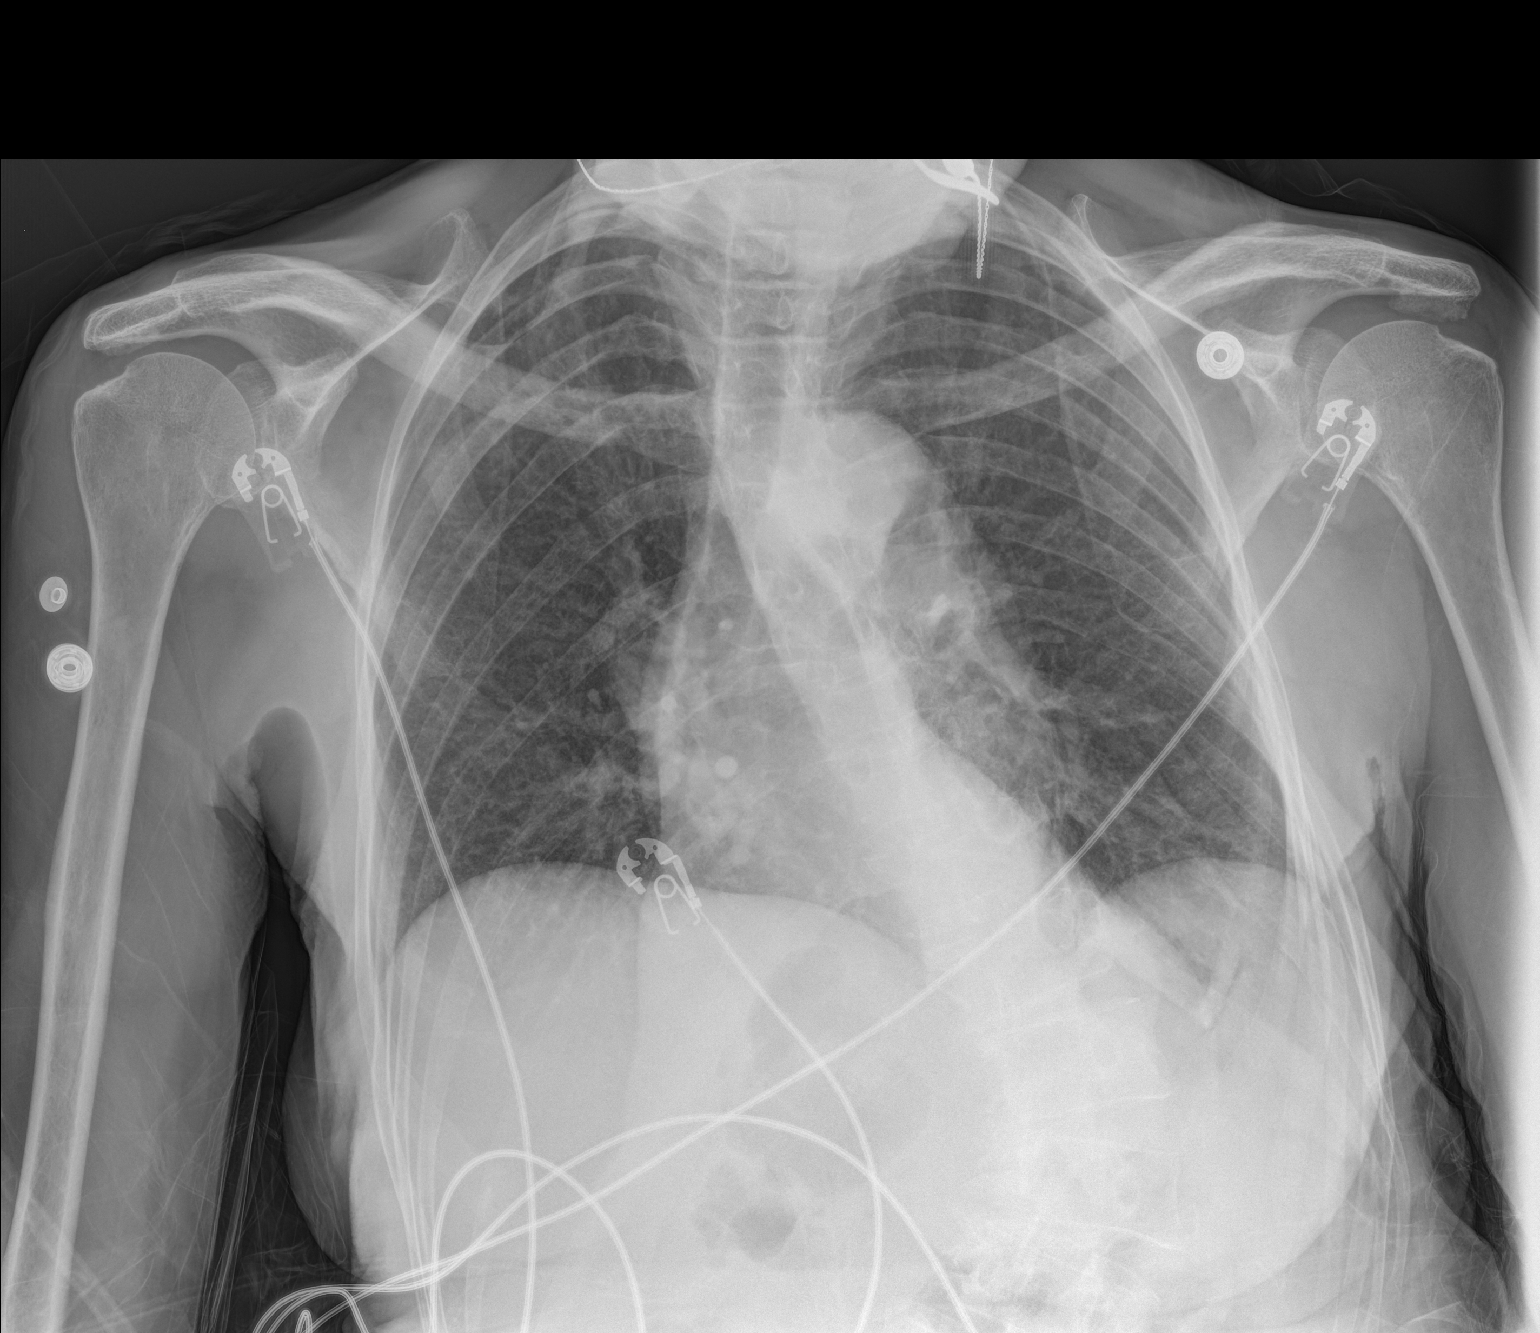

[1 of 1 positions shown; findings below may reference images not displayed]

FINDINGS: Moderate cardiomegaly. Normal vascularity. Left basilar atelectasis.
No pneumothorax or pleural effusion.
IMPRESSION: Cardiomegaly without decompensation.

## 2016-05-25 ENCOUNTER — Other Ambulatory Visit: Payer: Self-pay | Admitting: Family Medicine

## 2016-05-25 IMAGING — US US RENAL
1 series · 14 of 25 positions shown · non-contrast
Comparison: None

CLINICAL DATA: Acute on chronic renal failure

EXAM:
RENAL/URINARY TRACT ULTRASOUND COMPLETE

[Series 1: us renal · 0.18mm/px · 14 of 37 slices shown]
[im 1/37]
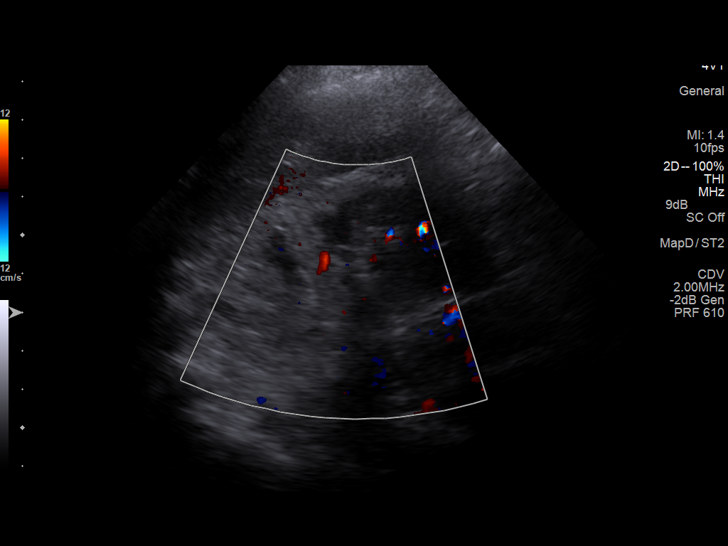
[im 4/37]
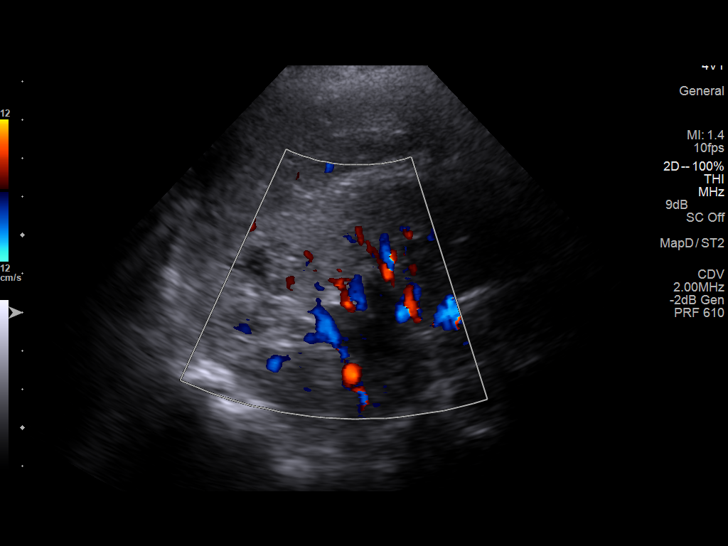
[im 7/37]
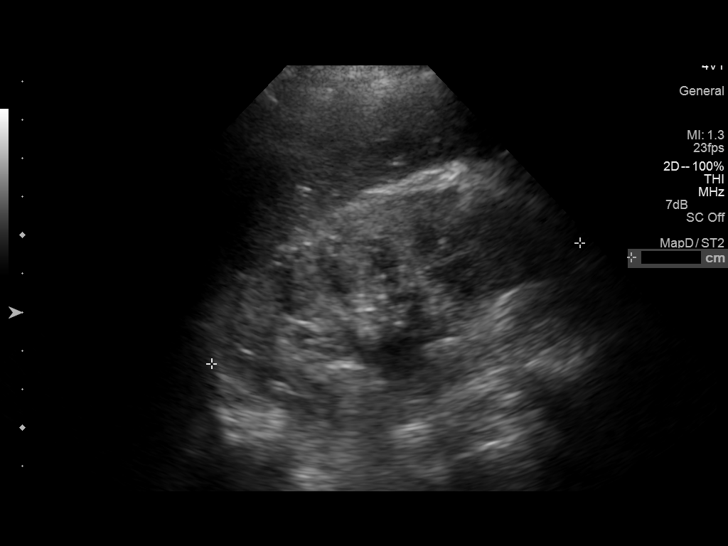
[im 10/37]
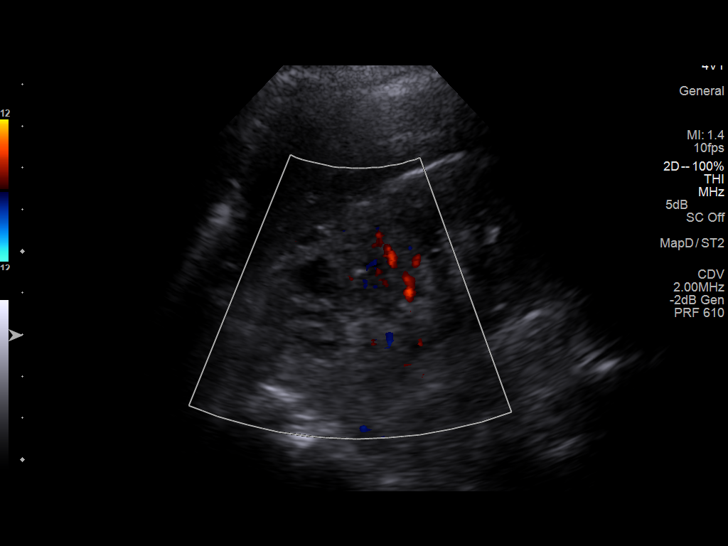
[im 13/37]
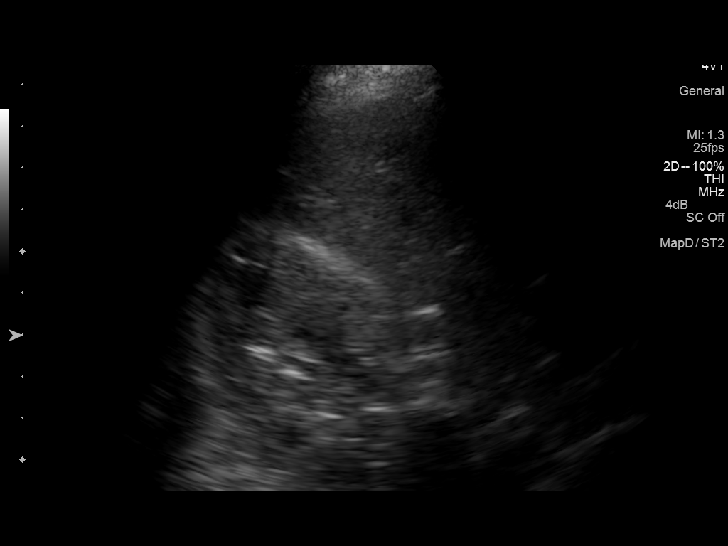
[im 14/37]
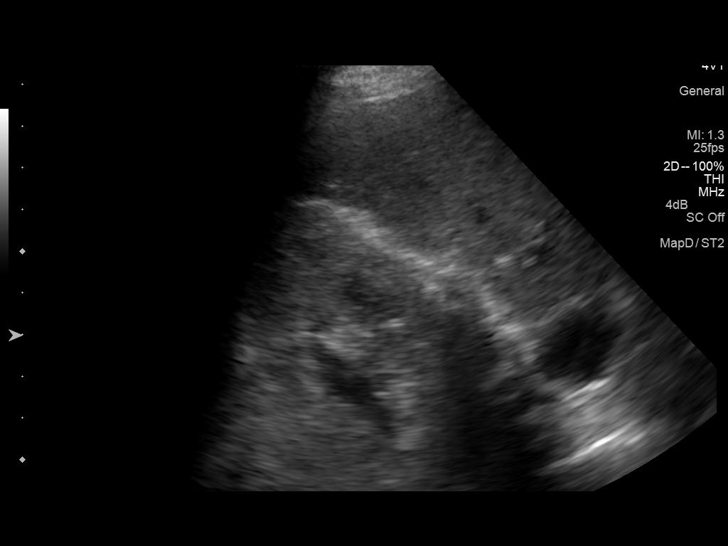
[im 17/37]
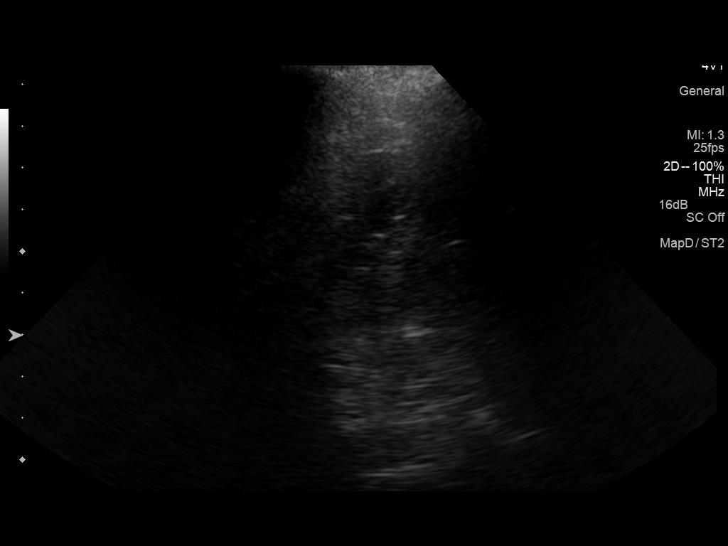
[im 20/37]
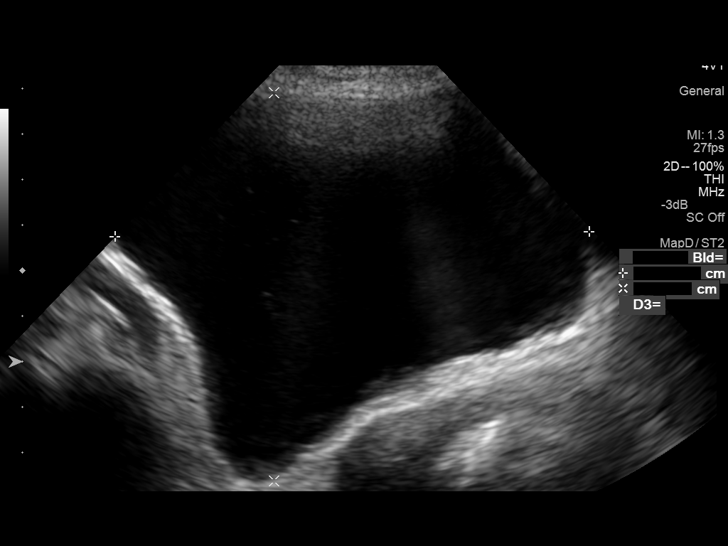
[im 23/37]
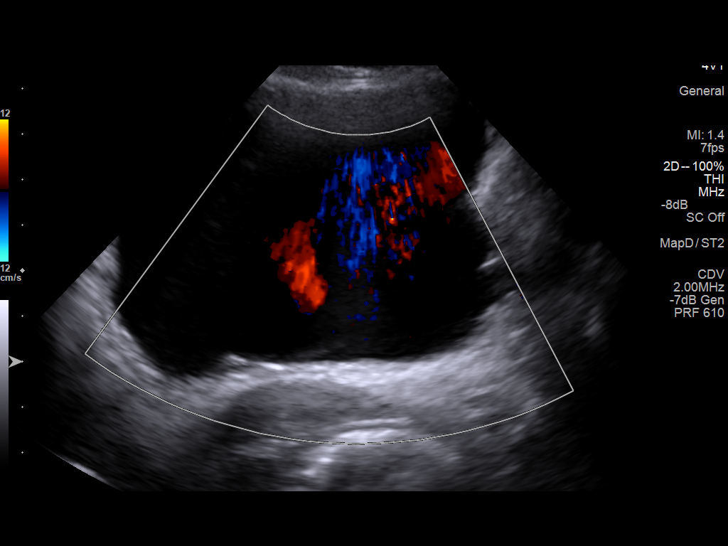
[im 25/37]
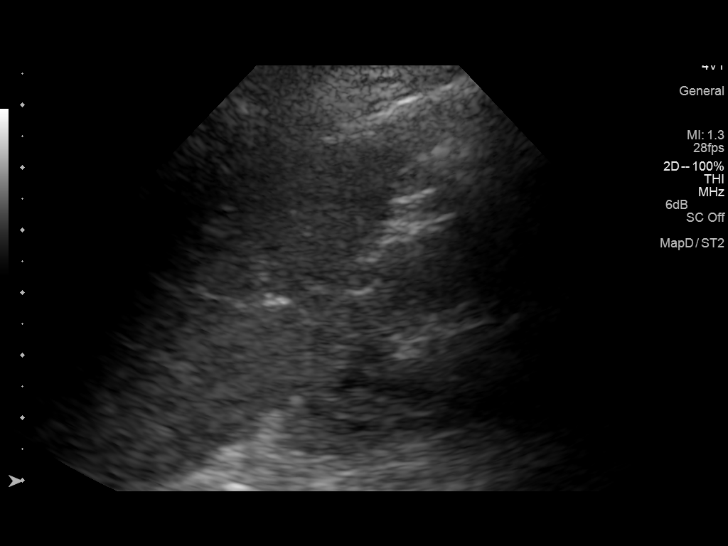
[im 28/37]
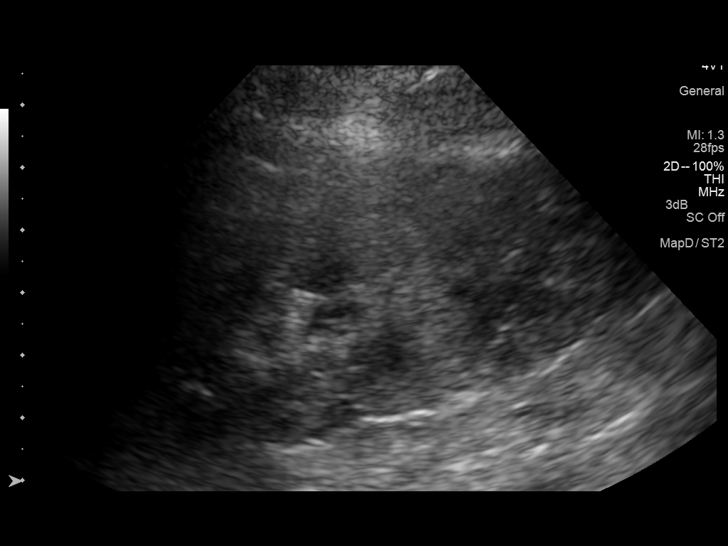
[im 31/37]
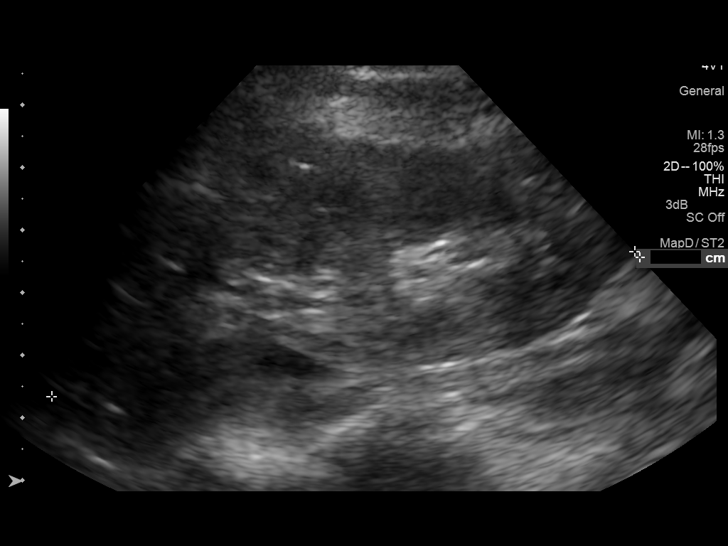
[im 34/37]
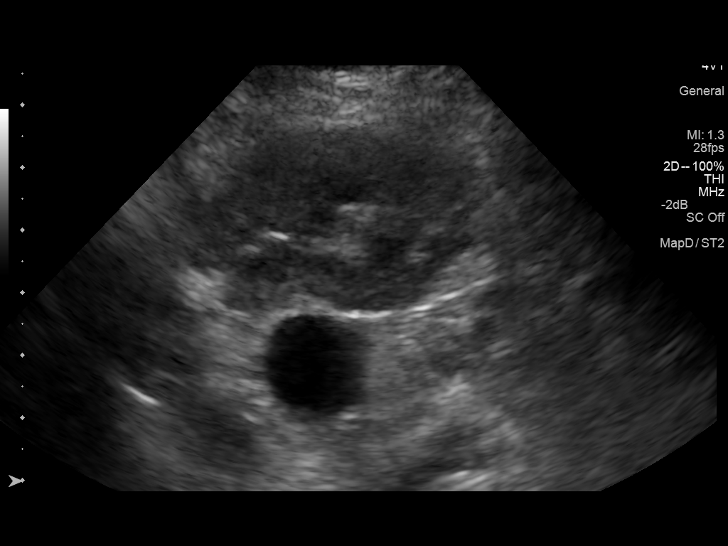
[im 37/37]
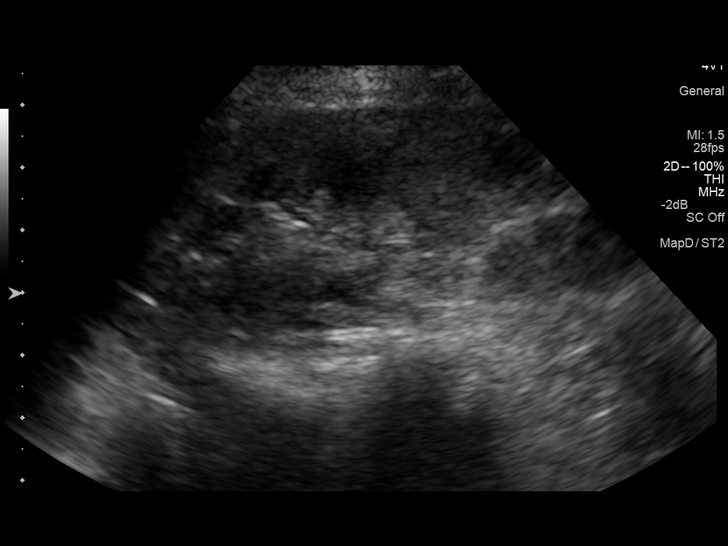

[14 of 25 positions shown; findings below may reference images not displayed]

FINDINGS: Right Kidney:

Length: 10.1 cm length. Normal cortical thickness. Increased
cortical echogenicity. Mild prominence of the RIGHT renal pelvis. No
gross hydronephrosis or shadowing calcification. Small hypoechoic
lesion at upper pole question small cyst 9 x 9 x 10 mm.

Left Kidney:

Length: 9.6 cm. Normal cortical thickness. Increased cortical
echogenicity. No mass, hydronephrosis or shadowing calcification.

Bladder:

Appears well distended without gross mass or wall thickening.
Prevoid volume calculated at 489 mL. Postvoid imaging was not
performed. BILATERAL ureteral jets noted.
IMPRESSION: Medical renal disease changes of both kidneys.

Small RIGHT renal cyst and minimal RIGHT hydronephrosis.

## 2016-05-26 ENCOUNTER — Other Ambulatory Visit: Payer: Self-pay | Admitting: Family Medicine

## 2016-05-27 ENCOUNTER — Telehealth: Payer: Self-pay | Admitting: Internal Medicine

## 2016-05-27 MED ORDER — AMLODIPINE BESYLATE 10 MG PO TABS: 10.0000 mg | ORAL_TABLET | Freq: Every day | ORAL | 0 refills | Status: DC

## 2016-05-27 MED ORDER — ATORVASTATIN CALCIUM 10 MG PO TABS: 10.0000 mg | ORAL_TABLET | Freq: Every day | ORAL | 0 refills | Status: DC

## 2016-05-27 NOTE — Telephone Encounter (Signed)
Patient needs amlodipine and atrovastatin

## 2016-05-27 NOTE — Telephone Encounter (Signed)
Requested medications refilled x 30 days - patient must have office visit for further refills.

## 2016-06-14 DIAGNOSIS — N183 Chronic kidney disease, stage 3 (moderate): Secondary | ICD-10-CM | POA: Diagnosis not present

## 2016-06-14 DIAGNOSIS — I10 Essential (primary) hypertension: Secondary | ICD-10-CM | POA: Diagnosis not present

## 2016-06-14 DIAGNOSIS — I2510 Atherosclerotic heart disease of native coronary artery without angina pectoris: Secondary | ICD-10-CM | POA: Diagnosis not present

## 2016-06-30 ENCOUNTER — Inpatient Hospital Stay (HOSPITAL_COMMUNITY)
Admission: EM | Admit: 2016-06-30 | Discharge: 2016-07-02 | DRG: 689 | Disposition: A | Discharge status: Home Health | Payer: Medicare Other | Attending: Internal Medicine | Admitting: Internal Medicine

## 2016-06-30 ENCOUNTER — Encounter (HOSPITAL_COMMUNITY): Payer: Self-pay | Admitting: Emergency Medicine

## 2016-06-30 DIAGNOSIS — D649 Anemia, unspecified: Secondary | ICD-10-CM | POA: Diagnosis present

## 2016-06-30 DIAGNOSIS — I10 Essential (primary) hypertension: Secondary | ICD-10-CM | POA: Diagnosis not present

## 2016-06-30 DIAGNOSIS — E860 Dehydration: Secondary | ICD-10-CM

## 2016-06-30 DIAGNOSIS — E43 Unspecified severe protein-calorie malnutrition: Secondary | ICD-10-CM | POA: Diagnosis present

## 2016-06-30 DIAGNOSIS — N3000 Acute cystitis without hematuria: Secondary | ICD-10-CM | POA: Diagnosis not present

## 2016-06-30 DIAGNOSIS — R531 Weakness: Secondary | ICD-10-CM | POA: Diagnosis not present

## 2016-06-30 DIAGNOSIS — Z8249 Family history of ischemic heart disease and other diseases of the circulatory system: Secondary | ICD-10-CM

## 2016-06-30 DIAGNOSIS — N183 Chronic kidney disease, stage 3 unspecified: Secondary | ICD-10-CM | POA: Diagnosis present

## 2016-06-30 DIAGNOSIS — E876 Hypokalemia: Secondary | ICD-10-CM | POA: Diagnosis present

## 2016-06-30 DIAGNOSIS — R627 Adult failure to thrive: Secondary | ICD-10-CM | POA: Diagnosis present

## 2016-06-30 DIAGNOSIS — Z79899 Other long term (current) drug therapy: Secondary | ICD-10-CM

## 2016-06-30 DIAGNOSIS — N390 Urinary tract infection, site not specified: Principal | ICD-10-CM | POA: Diagnosis present

## 2016-06-30 DIAGNOSIS — Z681 Body mass index (BMI) 19 or less, adult: Secondary | ICD-10-CM

## 2016-06-30 DIAGNOSIS — I129 Hypertensive chronic kidney disease with stage 1 through stage 4 chronic kidney disease, or unspecified chronic kidney disease: Secondary | ICD-10-CM | POA: Diagnosis present

## 2016-06-30 DIAGNOSIS — Z66 Do not resuscitate: Secondary | ICD-10-CM | POA: Diagnosis present

## 2016-06-30 DIAGNOSIS — L89152 Pressure ulcer of sacral region, stage 2: Secondary | ICD-10-CM | POA: Diagnosis not present

## 2016-06-30 DIAGNOSIS — E785 Hyperlipidemia, unspecified: Secondary | ICD-10-CM | POA: Diagnosis present

## 2016-06-30 DIAGNOSIS — R64 Cachexia: Secondary | ICD-10-CM | POA: Diagnosis present

## 2016-06-30 DIAGNOSIS — Z955 Presence of coronary angioplasty implant and graft: Secondary | ICD-10-CM

## 2016-06-30 DIAGNOSIS — I2510 Atherosclerotic heart disease of native coronary artery without angina pectoris: Secondary | ICD-10-CM | POA: Diagnosis present

## 2016-06-30 DIAGNOSIS — D509 Iron deficiency anemia, unspecified: Secondary | ICD-10-CM | POA: Diagnosis present

## 2016-06-30 DIAGNOSIS — L8990 Pressure ulcer of unspecified site, unspecified stage: Secondary | ICD-10-CM | POA: Insufficient documentation

## 2016-06-30 LAB — CBC
HCT: 28.7 % — ABNORMAL LOW (ref 36.0–46.0)
HCT: 28.8 % — ABNORMAL LOW (ref 36.0–46.0)
Hemoglobin: 9 g/dL — ABNORMAL LOW (ref 12.0–15.0)
Hemoglobin: 9.2 g/dL — ABNORMAL LOW (ref 12.0–15.0)
MCH: 23.4 pg — AB (ref 26.0–34.0)
MCH: 23.5 pg — ABNORMAL LOW (ref 26.0–34.0)
MCHC: 31.4 g/dL (ref 30.0–36.0)
MCHC: 31.9 g/dL (ref 30.0–36.0)
MCV: 74.5 fL — ABNORMAL LOW (ref 78.0–100.0)
MCV: 73.7 fL — ABNORMAL LOW (ref 78.0–100.0)
PLATELETS: 839 10*3/uL — AB (ref 150–400)
Platelets: 867 10*3/uL — ABNORMAL HIGH (ref 150–400)
RBC: 3.85 MIL/uL — ABNORMAL LOW (ref 3.87–5.11)
RBC: 3.91 MIL/uL (ref 3.87–5.11)
RDW: 29.6 % — AB (ref 11.5–15.5)
RDW: 29 % — AB (ref 11.5–15.5)
WBC: 13.6 10*3/uL — ABNORMAL HIGH (ref 4.0–10.5)
WBC: 14.4 10*3/uL — AB (ref 4.0–10.5)

## 2016-06-30 LAB — URINALYSIS, ROUTINE W REFLEX MICROSCOPIC
Bilirubin Urine: NEGATIVE
GLUCOSE, UA: NEGATIVE mg/dL
Ketones, ur: NEGATIVE mg/dL
Nitrite: POSITIVE — AB
PH: 6 (ref 5.0–8.0)
PROTEIN: NEGATIVE mg/dL
Specific Gravity, Urine: 1.015 (ref 1.005–1.030)

## 2016-06-30 LAB — COMPREHENSIVE METABOLIC PANEL
ALBUMIN: 3.9 g/dL (ref 3.5–5.0)
ALK PHOS: 58 U/L (ref 38–126)
ALT: 21 U/L (ref 14–54)
AST: 42 U/L — AB (ref 15–41)
Anion gap: 12 (ref 5–15)
BILIRUBIN TOTAL: 0.6 mg/dL (ref 0.3–1.2)
BUN: 23 mg/dL — AB (ref 6–20)
CALCIUM: 8.9 mg/dL (ref 8.9–10.3)
CO2: 25 mmol/L (ref 22–32)
Chloride: 103 mmol/L (ref 101–111)
Creatinine, Ser: 1 mg/dL (ref 0.44–1.00)
GFR calc Af Amer: >60 mL/min (ref >60)
GFR calc non Af Amer: 54 mL/min — ABNORMAL LOW (ref >60)
GLUCOSE: 77 mg/dL (ref 65–99)
Potassium: 3.3 mmol/L — ABNORMAL LOW (ref 3.5–5.1)
Sodium: 140 mmol/L (ref 135–145)
TOTAL PROTEIN: 8.3 g/dL — AB (ref 6.5–8.1)

## 2016-06-30 LAB — RETICULOCYTES
RBC.: 3.91 MIL/uL (ref 3.87–5.11)
RETIC COUNT ABSOLUTE: 78.2 10*3/uL (ref 19.0–186.0)
Retic Ct Pct: 2 % (ref 0.4–3.1)

## 2016-06-30 LAB — TYPE AND SCREEN
ABO/RH(D): B POS
Antibody Screen: NEGATIVE

## 2016-06-30 LAB — CREATININE, SERUM: CREATININE: 0.86 mg/dL (ref 0.44–1.00)

## 2016-06-30 LAB — TROPONIN I

## 2016-06-30 LAB — CBG MONITORING, ED: GLUCOSE-CAPILLARY: 78 mg/dL (ref 65–99)

## 2016-06-30 LAB — URINE MICROSCOPIC-ADD ON

## 2016-06-30 LAB — I-STAT CG4 LACTIC ACID, ED: Lactic Acid, Venous: 1.33 mmol/L (ref 0.5–1.9)

## 2016-06-30 LAB — TSH: TSH: 1.806 u[IU]/mL (ref 0.350–4.500)

## 2016-06-30 MED ORDER — ENOXAPARIN SODIUM 40 MG/0.4ML ~~LOC~~ SOLN
40.0000 mg | Freq: Every day | SUBCUTANEOUS | Status: DC
  Administered 2016-06-30: 40 mg via SUBCUTANEOUS
  Filled 2016-06-30: qty 0.4

## 2016-06-30 MED ORDER — BOOST / RESOURCE BREEZE PO LIQD
1.0000 | Freq: Two times a day (BID) | ORAL | Status: DC
  Administered 2016-07-01: 1 via ORAL

## 2016-06-30 MED ORDER — ACETAMINOPHEN 650 MG RE SUPP: 650.0000 mg | Freq: Four times a day (QID) | RECTAL | Status: DC | PRN

## 2016-06-30 MED ORDER — ACETAMINOPHEN 325 MG PO TABS
650.0000 mg | ORAL_TABLET | Freq: Four times a day (QID) | ORAL | Status: DC | PRN
  Filled 2016-06-30: qty 2

## 2016-06-30 MED ORDER — SODIUM CHLORIDE 0.9 % IV SOLN
INTRAVENOUS | Status: DC
  Administered 2016-06-30 – 2016-07-01 (×2): via INTRAVENOUS

## 2016-06-30 MED ORDER — DEXTROSE 5 % IV SOLN
1.0000 g | Freq: Once | INTRAVENOUS | Status: AC
  Administered 2016-06-30: 1 g via INTRAVENOUS
  Filled 2016-06-30: qty 10

## 2016-06-30 MED ORDER — DEXTROSE 5 % IV SOLN
1.0000 g | INTRAVENOUS | Status: DC
  Administered 2016-07-01 – 2016-07-02 (×2): 1 g via INTRAVENOUS
  Filled 2016-06-30 (×2): qty 10

## 2016-06-30 MED ORDER — SODIUM CHLORIDE 0.9 % IV BOLUS (SEPSIS)
1000.0000 mL | Freq: Once | INTRAVENOUS | Status: AC
  Administered 2016-06-30: 1000 mL via INTRAVENOUS

## 2016-06-30 MED ORDER — ONDANSETRON HCL 4 MG PO TABS: 4.0000 mg | ORAL_TABLET | Freq: Four times a day (QID) | ORAL | Status: DC | PRN

## 2016-06-30 MED ORDER — AMLODIPINE BESYLATE 5 MG PO TABS
10.0000 mg | ORAL_TABLET | Freq: Every day | ORAL | Status: DC
  Administered 2016-07-01 – 2016-07-02 (×2): 10 mg via ORAL
  Filled 2016-06-30 (×2): qty 2

## 2016-06-30 MED ORDER — ATORVASTATIN CALCIUM 10 MG PO TABS
10.0000 mg | ORAL_TABLET | Freq: Every day | ORAL | Status: DC
  Administered 2016-07-01 – 2016-07-02 (×2): 10 mg via ORAL
  Filled 2016-06-30 (×2): qty 1

## 2016-06-30 MED ORDER — ONDANSETRON HCL 4 MG/2ML IJ SOLN: 4.0000 mg | Freq: Four times a day (QID) | INTRAMUSCULAR | Status: DC | PRN

## 2016-06-30 NOTE — ED Provider Notes (Signed)
[facility] DEPT Provider Note   CSN: [id number] Arrival date & time: 06/30/16  1504     History   Chief Complaint Chief Complaint  Patient presents with  . Weakness    HPI Ashlee Mack is a 75 y.o. female.  Pt presents to the ED today with complaints of weakness.  The pt is a poor historian.  Information is obtained by her husband.  Pt gets out of her recliner very infrequently.  In the last few days, she has not been able to get up at all.  Pt denies any pain.  Husband said that she has not been eating or drinking much.      Past Medical History:  Diagnosis Date  . CKD (chronic kidney disease) stage 3, GFR 30-59 ml/min 07/31/2015  . Coronary artery disease    Patient denies  . Hyperlipidemia   . Hypertension     Patient Active Problem List   Diagnosis Date Noted  . Anemia of chronic disease 01/03/2016  . Pre-diabetes 08/13/2015  . Bradycardia 08/03/2015  . Orthostasis 08/03/2015  . Syncope 08/02/2015  . Chronic kidney disease 08/02/2015  . Fecal impaction ([place]) 08/01/2015  . Lower urinary tract infection   . Generalized weakness 07/31/2015  . CKD (chronic kidney disease) stage 3, GFR 30-59 ml/min 07/31/2015  . Chronic anemia 07/31/2015  . Pressure ulcer 02/12/2015  . Acute pyelonephritis 02/10/2015  . Constipation 02/10/2015  . Lactic acidosis 02/10/2015  . Acute encephalopathy 02/10/2015  . Malnutrition of moderate degree ([place]) 02/10/2015  . Severe sepsis ([place])   . Sepsis secondary to UTI ([place])   . Poor appetite 12/10/2014  . Acute renal failure syndrome ([place])   . Acute on chronic renal failure ([place]) 08/27/2014  . Essential hypertension 08/27/2014  . UTI (lower urinary tract infection) 08/26/2014  . SIRS (systemic inflammatory response syndrome) ([place]) 08/26/2014  . Acute renal failure ([place]) 08/26/2014  . Nausea and vomiting 08/26/2014  . Diarrhea 08/26/2014  . Coronary artery disease   . Malignant hypertension 03/23/2014  . Uncontrolled  hypertension 03/23/2014  . 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 Hypokalemia 03/23/2014  . Anemia, iron deficiency 03/23/2014  . Protein-calorie malnutrition, severe ([place]) 03/23/2014  . Hypertension 03/22/2014    Past Surgical History:  Procedure Laterality Date  . CORONARY ANGIOPLASTY WITH STENT PLACEMENT      OB History    No data available       Home Medications    Prior to Admission medications   Medication Sig Start Date End Date Taking? Authorizing Provider  amLODipine (NORVASC) 10 MG tablet Take 1 tablet (10 mg total) by mouth daily. 05/27/16  Yes Tresa Garter, MD  atorvastatin (LIPITOR) 10 MG tablet Take 1 tablet (10 mg total) by mouth daily. 05/27/16  Yes Tresa Garter, MD  feeding supplement (BOOST / RESOURCE BREEZE) LIQD Take 1 Container by mouth 2 (two) times daily between meals. 01/05/16  Yes Debbe Odea, MD    Family History Family History  Problem Relation Age of Onset  . Hypertension Mother   . Hypertension Father     Social History Social History  Substance Use Topics  . Smoking status: Never Smoker  . Smokeless tobacco: Never Used  . Alcohol use No     Allergies   Review of patient's allergies indicates no known allergies.   Review of Systems Review of Systems  Constitutional: Positive for fatigue.  All other systems reviewed and are negative.    Physical  Exam Updated Vital Signs BP 136/99   Pulse 89   Temp 99 F (37.2 C)   Resp 20   SpO2 100%   Physical Exam  Constitutional: She appears cachectic.  HENT:  Head: Normocephalic and atraumatic.  Right Ear: External ear normal.  Left Ear: External ear normal.  Nose: Nose normal.  Mouth/Throat: Oropharynx is clear and moist.  Eyes: Conjunctivae and EOM are normal. Pupils are equal, round, and reactive to light.  Neck: Normal range of motion. Neck supple.  Cardiovascular: Normal rate, regular rhythm, normal heart sounds and intact  distal pulses.   Pulmonary/Chest: Effort normal and breath sounds normal.  Abdominal: Soft. Bowel sounds are normal.  Musculoskeletal: Normal range of motion.  Neurological: She is alert.  Skin: Skin is warm.  Psychiatric: She has a normal mood and affect. Her behavior is normal. Judgment and thought content normal.  Nursing note and vitals reviewed.    ED Treatments / Results  Labs (all labs ordered are listed, but only abnormal results are displayed) Labs Reviewed  COMPREHENSIVE METABOLIC PANEL - Abnormal; Notable for the following:       Result Value   Potassium 3.3 (*)    BUN 23 (*)    Total Protein 8.3 (*)    AST 42 (*)    GFR calc non Af Amer 54 (*)    All other components within normal limits  CBC - Abnormal; Notable for the following:    WBC 13.6 (*)    RBC 3.85 (*)    Hemoglobin 9.0 (*)    HCT 28.7 (*)    MCV 74.5 (*)    MCH 23.4 (*)    RDW 29.6 (*)    Platelets 867 (*)    All other components within normal limits  TROPONIN I  URINALYSIS, ROUTINE W REFLEX MICROSCOPIC (NOT AT [facility])  CBG MONITORING, ED    EKG  EKG Interpretation  Date/Time:  Wednesday June 30 2016 15:56:39 EDT Ventricular Rate:  84 PR Interval:    QRS Duration: 96 QT Interval:  478 QTC Calculation: 566 R Axis:   54 Text Interpretation:  Sinus rhythm Anterior infarct, old Nonspecific T abnormalities, lateral leads New since previous tracing Prolonged QT interval Confirmed by [facility] MD, Alexzavier Girardin ([id number]) on 06/30/2016 4:19:55 PM       Radiology No results found.  Procedures Procedures (including critical care time)  Medications Ordered in ED Medications  sodium chloride 0.9 % bolus 1,000 mL (not administered)  sodium chloride 0.9 % bolus 1,000 mL (0 mLs Intravenous Stopped 06/30/16 1722)     Initial Impression / Assessment and Plan / ED Course  I have reviewed the triage vital signs and the nursing notes.  Pertinent labs & imaging results that were available during my care of  the patient were reviewed by me and considered in my medical decision making (see chart for details).  Clinical Course  Pt is feeling better after IVFs.  Pt signed out to Dr. Darl Householder pending urine results.  I spoke with the case manager regarding home health if she goes home.    Final Clinical Impressions(s) / ED Diagnoses   Final diagnoses:  Weakness  Dehydration    New Prescriptions New Prescriptions   No medications on file     Isla Pence, MD 07/01/16 1326

## 2016-06-30 NOTE — ED Triage Notes (Signed)
Pt from home with her husband and son with complaints of weakness and fatigue x 2 weeks. Per pt she has had a decreased appetite and increased lethargy. Pt denies fevers, chills, pain, n,v,d, and denies falls. Pt has not been around any one sick. Pt denies any recent changes in environment or medications. Pt a and o x 4. Pt has some delay answering questions, but this is her baseline per son.

## 2016-06-30 NOTE — ED Notes (Signed)
Performed patient hygiene, patient a soiled diaper. Patients has skin breakdown on her buttocks. Cleansed with non-rinse cleaner, dried, and applied barrier ointment. Pt tolerated it well.

## 2016-06-30 NOTE — Progress Notes (Signed)
PHARMACY NOTE -  Rocephin  Pharmacy has been assisting with dosing of Rocephin for UTI. Dosage remains stable at 1g IV q24 hr and need for further dosage adjustment appears unlikely at present.    Will sign off at this time.  Please reconsult if a change in clinical status warrants re-evaluation of dosage.   Reuel Boom, PharmD, BCPS Pager: [phone] 06/30/2016, 10:18 PM

## 2016-06-30 NOTE — ED Notes (Signed)
Antibiotics started before Cultures were drawn

## 2016-06-30 NOTE — ED Notes (Signed)
Pt tolerating oral fluids 

## 2016-06-30 NOTE — ED Provider Notes (Signed)
  Physical Exam  BP 136/99   Pulse 89   Temp 99 F (37.2 C)   Resp 20   SpO2 100%   Physical Exam  ED Course  Procedures  MDM Care assumed at sign out. Patient has been feeling weak. Sign out pending UA. Patient has WBC 13. UA + nitrate and leuk. WBC too many to count. Low grade temp 99, meets sepsis criteria. Added lactate, cultures. Given rocephin. Will admit.      Drenda Freeze, MD 06/30/16 Einar Crow

## 2016-06-30 NOTE — ED Notes (Signed)
Performed patient hygiene, pt had urinated and had stool. Changed patients sheets, gown, and incontenience pad. Pt tolerated it well.

## 2016-06-30 NOTE — ED Notes (Signed)
EKG given to EDP,Yao,MD., for review. 

## 2016-06-30 NOTE — ED Notes (Signed)
Pt is aware a urine is needed but reports she is unable to urinate. Will give fluids time to infuse.

## 2016-06-30 NOTE — Progress Notes (Addendum)
[facility] consulted for home health services.  EDCM spoke to patient and her husband at bedside.  Patient lives at home with her husband and her son.  Patient's husband reports he assists patient with ADL's.  He reports patient is able to stand with her walker while he cleans her up.  Reports she can only stand for 2-3 minutes.  Patient reports she has a walker and bedside commode at home.  Patient was seen at the [facility] in the past.  Since then the doctor has left the practice and patient has not seen anyone since then for primary care.  EDCM received patient's husband's permission to email [facility] in efforts to obtain an appointment.  EDCM provided patient's husband list of private duty nursing agencies, explained will be out of pocket expense,  List of home health agencies in [place] explained services, contact information for choice connections,  Programme researcher, broadcasting/film/video.  Patient and husband thankful for resources.  Patient's husband reports he would rather speak to his son regarding choice of home health agency.  Patient noted to have a UTI.  Discussed patient with EDP. No further EDCM needs at this time.  06/30/2016 2200pm EDCM provided patient's husband with list of providers who accept Medicare insurance.

## 2016-06-30 NOTE — H&P (Signed)
History and Physical    Ashlee Mack [id number] DOB: 1940-12-28 DOA: 06/30/2016  PCP: Lance Bosch, NP  Patient coming from: Home.  Chief Complaint: Weakness.  HPI: Ashlee Mack is a 75 y.o. female with hypertension hyperlipidemia and chronic kidney disease stage III and chronic anemia presents to the ER because of increasing weakness over the last few weeks. Denies any chest pain or shortness of breath. Patient has fatigue. Has been having poor appetite last few days but denies any abdominal pain vomiting or diarrhea. In the ER patient's UA shows features consistent with UTI. Patient was initially mildly tachycardic for which patient was given fluid bolus. Patient is being admitted for generalized weakness and UTI.   ED Course: Patient started on antibiotics and fluids.  Review of Systems: As per HPI, rest all negative.   Past Medical History:  Diagnosis Date  . CKD (chronic kidney disease) stage 3, GFR 30-59 ml/min 07/31/2015  . Coronary artery disease    Patient denies  . Hyperlipidemia   . Hypertension     Past Surgical History:  Procedure Laterality Date  . CORONARY ANGIOPLASTY WITH STENT PLACEMENT       reports that she has never smoked. She has never used smokeless tobacco. She reports that she does not drink alcohol or use drugs.  No Known Allergies  Family History  Problem Relation Age of Onset  . Hypertension Mother   . Hypertension Father     Prior to Admission medications   Medication Sig Start Date End Date Taking? Authorizing Provider  amLODipine (NORVASC) 10 MG tablet Take 1 tablet (10 mg total) by mouth daily. 05/27/16  Yes Tresa Garter, MD  atorvastatin (LIPITOR) 10 MG tablet Take 1 tablet (10 mg total) by mouth daily. 05/27/16  Yes Tresa Garter, MD  feeding supplement (BOOST / RESOURCE BREEZE) LIQD Take 1 Container by mouth 2 (two) times daily between meals. 01/05/16  Yes Debbe Odea, MD    Physical Exam: Vitals:   06/30/16 2030  06/30/16 2045 06/30/16 2100 06/30/16 2130  BP: 138/77 131/71 141/75 129/73  Pulse:  79 84 86  Resp:      Temp:      TempSrc:      SpO2:  96% 97% 96%      Constitutional: Monitor labeled and poorly nourished. Vitals:   06/30/16 2030 06/30/16 2045 06/30/16 2100 06/30/16 2130  BP: 138/77 131/71 141/75 129/73  Pulse:  79 84 86  Resp:      Temp:      TempSrc:      SpO2:  96% 97% 96%   Eyes: Anicteric mild pallor. ENMT: No discharge from the ears eyes nose or mouth. Neck: No mass felt. No JVD appreciated no neck rigidity. Respiratory: No rhonchi or crepitations. Bilateral air entry present. Cardiovascular: S1-S2 heard no murmurs appreciated. Abdomen: Soft nontender bowel sounds present. No guarding or rigidity. Musculoskeletal: No edema. No joint effusion. Skin: No skin rash skin appears warm. Neurologic: Alert awake oriented to time place and person. Moves all extremities. Psychiatric: Appears normal. Normal affect.   Labs on Admission: I have personally reviewed following labs and imaging studies  CBC:  Recent Labs Lab 06/30/16 1608  WBC 13.6*  HGB 9.0*  HCT 28.7*  MCV 74.5*  PLT 123XX123*   Basic Metabolic Panel:  Recent Labs Lab 06/30/16 1608  NA 140  K 3.3*  CL 103  CO2 25  GLUCOSE 77  BUN 23*  CREATININE 1.00  CALCIUM 8.9  GFR: CrCl cannot be calculated (Unknown ideal weight.). Liver Function Tests:  Recent Labs Lab 06/30/16 1608  AST 42*  ALT 21  ALKPHOS 58  BILITOT 0.6  PROT 8.3*  ALBUMIN 3.9   No results for input(s): LIPASE, AMYLASE in the last 168 hours. No results for input(s): AMMONIA in the last 168 hours. Coagulation Profile: No results for input(s): INR, PROTIME in the last 168 hours. Cardiac Enzymes:  Recent Labs Lab 06/30/16 1604  TROPONINI <0.03   BNP (last 3 results) No results for input(s): PROBNP in the last 8760 hours. HbA1C: No results for input(s): HGBA1C in the last 72 hours. CBG:  Recent Labs Lab  06/30/16 1530  GLUCAP 78   Lipid Profile: No results for input(s): CHOL, HDL, LDLCALC, TRIG, CHOLHDL, LDLDIRECT in the last 72 hours. Thyroid Function Tests: No results for input(s): TSH, T4TOTAL, FREET4, T3FREE, THYROIDAB in the last 72 hours. Anemia Panel: No results for input(s): VITAMINB12, FOLATE, FERRITIN, TIBC, IRON, RETICCTPCT in the last 72 hours. Urine analysis:    Component Value Date/Time   COLORURINE YELLOW 06/30/2016 1651   APPEARANCEUR CLOUDY (A) 06/30/2016 1651   LABSPEC 1.015 06/30/2016 1651   PHURINE 6.0 06/30/2016 1651   GLUCOSEU NEGATIVE 06/30/2016 1651   HGBUR TRACE (A) 06/30/2016 1651   BILIRUBINUR NEGATIVE 06/30/2016 1651   BILIRUBINUR neg 09/11/2014 1546   KETONESUR NEGATIVE 06/30/2016 1651   PROTEINUR NEGATIVE 06/30/2016 1651   UROBILINOGEN 1.0 02/09/2015 2051   NITRITE POSITIVE (A) 06/30/2016 1651   LEUKOCYTESUR LARGE (A) 06/30/2016 1651   Sepsis Labs: @LABRCNTIP (procalcitonin:4,lacticidven:4) )No results found for this or any previous visit (from the past 240 hour(s)).   Radiological Exams on Admission: No results found.  EKG: Independently reviewed. Normal sinus rhythm with nonspecific ST changes.  Assessment/Plan Principal Problem:   Weakness generalized Active Problems:   Lower urinary tract infectious disease   Essential hypertension   CKD (chronic kidney disease) stage 3, GFR 30-59 ml/min   Chronic anemia   Weakness    1. Generalized weakness - probably multifactorial - deconditioning, anemia and poor appetite with some degree of malnutrition. UTI also contributing to patient's symptoms. At this time we will gently hydrate and request physical therapy consult. In addition we'll check CK levels TSH, troponin. 2. UTI - probably contributing to #1. Check urine cultures patient is on ceftriaxone. 3. Hypertension - continue amlodipine. 4. Hyperlipidemia on statins. 5. Chronic anemia - follow CBC. Patient during last admission had received  transfusion. I have requested for type and screen. If hemoglobin drops less than 7 will transfuse. 6. Chronic kidney disease stage III - follow metabolic panel.   DVT prophylaxis: Lovenox. Code Status: DO NOT RESUSCITATE.  Family Communication: Patient's husband.  Disposition Plan: Home.  Consults called: Physical therapy.  Admission status: Observation. Telemetry.    Rise Patience MD [facility] Pager [phone].  If 7PM-7AM, please contact night-coverage [email or web address] Password [email or web address]  06/30/2016, 10:09 PM

## 2016-07-01 DIAGNOSIS — N183 Chronic kidney disease, stage 3 (moderate): Secondary | ICD-10-CM

## 2016-07-01 DIAGNOSIS — Z8249 Family history of ischemic heart disease and other diseases of the circulatory system: Secondary | ICD-10-CM | POA: Diagnosis not present

## 2016-07-01 DIAGNOSIS — Z66 Do not resuscitate: Secondary | ICD-10-CM | POA: Diagnosis present

## 2016-07-01 DIAGNOSIS — E876 Hypokalemia: Secondary | ICD-10-CM | POA: Diagnosis present

## 2016-07-01 DIAGNOSIS — Z681 Body mass index (BMI) 19 or less, adult: Secondary | ICD-10-CM | POA: Diagnosis not present

## 2016-07-01 DIAGNOSIS — L89102 Pressure ulcer of unspecified part of back, stage 2: Secondary | ICD-10-CM

## 2016-07-01 DIAGNOSIS — E860 Dehydration: Secondary | ICD-10-CM | POA: Diagnosis not present

## 2016-07-01 DIAGNOSIS — Z79899 Other long term (current) drug therapy: Secondary | ICD-10-CM | POA: Diagnosis not present

## 2016-07-01 DIAGNOSIS — E43 Unspecified severe protein-calorie malnutrition: Secondary | ICD-10-CM

## 2016-07-01 DIAGNOSIS — L89152 Pressure ulcer of sacral region, stage 2: Secondary | ICD-10-CM | POA: Diagnosis present

## 2016-07-01 DIAGNOSIS — L8990 Pressure ulcer of unspecified site, unspecified stage: Secondary | ICD-10-CM | POA: Insufficient documentation

## 2016-07-01 DIAGNOSIS — R627 Adult failure to thrive: Secondary | ICD-10-CM | POA: Diagnosis present

## 2016-07-01 DIAGNOSIS — E785 Hyperlipidemia, unspecified: Secondary | ICD-10-CM | POA: Diagnosis present

## 2016-07-01 DIAGNOSIS — D509 Iron deficiency anemia, unspecified: Secondary | ICD-10-CM | POA: Diagnosis present

## 2016-07-01 DIAGNOSIS — N390 Urinary tract infection, site not specified: Secondary | ICD-10-CM | POA: Diagnosis not present

## 2016-07-01 DIAGNOSIS — N3000 Acute cystitis without hematuria: Secondary | ICD-10-CM | POA: Diagnosis not present

## 2016-07-01 DIAGNOSIS — I129 Hypertensive chronic kidney disease with stage 1 through stage 4 chronic kidney disease, or unspecified chronic kidney disease: Secondary | ICD-10-CM | POA: Diagnosis present

## 2016-07-01 DIAGNOSIS — R64 Cachexia: Secondary | ICD-10-CM | POA: Diagnosis present

## 2016-07-01 DIAGNOSIS — Z955 Presence of coronary angioplasty implant and graft: Secondary | ICD-10-CM | POA: Diagnosis not present

## 2016-07-01 DIAGNOSIS — I2510 Atherosclerotic heart disease of native coronary artery without angina pectoris: Secondary | ICD-10-CM | POA: Diagnosis present

## 2016-07-01 DIAGNOSIS — D649 Anemia, unspecified: Secondary | ICD-10-CM | POA: Diagnosis present

## 2016-07-01 DIAGNOSIS — R531 Weakness: Secondary | ICD-10-CM | POA: Diagnosis not present

## 2016-07-01 LAB — PATHOLOGIST SMEAR REVIEW

## 2016-07-01 LAB — BASIC METABOLIC PANEL
Anion gap: 9 (ref 5–15)
BUN: 14 mg/dL (ref 6–20)
CALCIUM: 8.8 mg/dL — AB (ref 8.9–10.3)
CO2: 24 mmol/L (ref 22–32)
CREATININE: 0.89 mg/dL (ref 0.44–1.00)
Chloride: 107 mmol/L (ref 101–111)
GFR calc Af Amer: >60 mL/min (ref >60)
GLUCOSE: 90 mg/dL (ref 65–99)
POTASSIUM: 3.4 mmol/L — AB (ref 3.5–5.1)
SODIUM: 140 mmol/L (ref 135–145)

## 2016-07-01 LAB — IRON AND TIBC
IRON: 34 ug/dL (ref 28–170)
Saturation Ratios: 15 % (ref 10.4–31.8)
TIBC: 224 ug/dL — ABNORMAL LOW (ref 250–450)
UIBC: 190 ug/dL

## 2016-07-01 LAB — CBC
HEMATOCRIT: 28.7 % — AB (ref 36.0–46.0)
Hemoglobin: 9 g/dL — ABNORMAL LOW (ref 12.0–15.0)
MCH: 23 pg — ABNORMAL LOW (ref 26.0–34.0)
MCHC: 31.4 g/dL (ref 30.0–36.0)
MCV: 73.2 fL — ABNORMAL LOW (ref 78.0–100.0)
PLATELETS: 833 10*3/uL — AB (ref 150–400)
RBC: 3.92 MIL/uL (ref 3.87–5.11)
RDW: 29.3 % — AB (ref 11.5–15.5)
WBC: 11.7 10*3/uL — AB (ref 4.0–10.5)

## 2016-07-01 LAB — VITAMIN B12: Vitamin B-12: 521 pg/mL (ref 180–914)

## 2016-07-01 LAB — FERRITIN: Ferritin: 584 ng/mL — ABNORMAL HIGH (ref 11–307)

## 2016-07-01 LAB — FOLATE: FOLATE: 23.6 ng/mL (ref >5.9)

## 2016-07-01 MED ORDER — POLYETHYLENE GLYCOL 3350 17 G PO PACK
17.0000 g | PACK | Freq: Every day | ORAL | Status: DC
  Administered 2016-07-02 (×2): 17 g via ORAL
  Filled 2016-07-01 (×2): qty 1

## 2016-07-01 MED ORDER — ADULT MULTIVITAMIN W/MINERALS CH
1.0000 | ORAL_TABLET | Freq: Every day | ORAL | Status: DC
  Administered 2016-07-01 – 2016-07-02 (×2): 1 via ORAL
  Filled 2016-07-01 (×2): qty 1

## 2016-07-01 MED ORDER — ENSURE ENLIVE PO LIQD
237.0000 mL | Freq: Three times a day (TID) | ORAL | Status: DC
  Administered 2016-07-01 – 2016-07-02 (×3): 237 mL via ORAL

## 2016-07-01 MED ORDER — POTASSIUM CHLORIDE CRYS ER 20 MEQ PO TBCR
40.0000 meq | EXTENDED_RELEASE_TABLET | Freq: Once | ORAL | Status: AC
  Administered 2016-07-01: 40 meq via ORAL
  Filled 2016-07-01: qty 2

## 2016-07-01 MED ORDER — ENOXAPARIN SODIUM 30 MG/0.3ML ~~LOC~~ SOLN
30.0000 mg | Freq: Every day | SUBCUTANEOUS | Status: DC
  Administered 2016-07-01: 30 mg via SUBCUTANEOUS
  Filled 2016-07-01: qty 0.3

## 2016-07-01 NOTE — Progress Notes (Signed)
Rx Brief Lovenox note  Wt=45.2 kg, CrCl~38 ml/min Rx adjusted Lovenox to 30mg  daily in pt with wt <45 kg  Thanks Dorrene German 07/01/2016 1:29 AM

## 2016-07-01 NOTE — Progress Notes (Addendum)
Initial Nutrition Assessment  DOCUMENTATION CODES:   Underweight, Severe malnutrition in context of chronic illness  INTERVENTION:  - Will d/c Boost Breeze. - Will order Ensure Enlive TID, each supplement provides 350 kcal and 20 grams of protein - Will order daily multivitamin with minerals. - Continue to encourage PO intakes of meals and supplements. - Recommend liberalizing diet from Heart Healthy to Regular.  - RD will continue to monitor for additional nutrition-related needs.  NUTRITION DIAGNOSIS:   Malnutrition related to chronic illness as evidenced by severe depletion of muscle mass, severe depletion of body fat.  GOAL:   Patient will meet greater than or equal to 90% of their needs  MONITOR:   PO intake, Supplement acceptance, Weight trends, Labs, Skin, I & O's  REASON FOR ASSESSMENT:   Malnutrition Screening Tool  ASSESSMENT:   75 y.o. female with hypertension hyperlipidemia and chronic kidney disease stage III and chronic anemia presents to the ER because of increasing weakness over the last few weeks. Denies any chest pain or shortness of breath. Patient has fatigue. Has been having poor appetite last few days but denies any abdominal pain vomiting or diarrhea. In the ER patient's UA shows features consistent with UTI. Patient was initially mildly tachycardic for which patient was given fluid bolus. Patient is being admitted for generalized weakness and UTI.   Pt seen for MST. BMI indicates underweight status. Spoke with pt and granddaughter, who is at bedside. Pt reports that for breakfast she had rice krispies and orange juice. Granddaughter states that her father takes pt breakfast ~10 AM each day and is usually an item from a fast food restaurant. She states that pt does not have teeth and that her father often provides pt with sandwiches or other items that are difficult for pt to chew. Granddaughter states that pt drinks 2 Ensure/day and that the refrigerator is  stocked with this supplement. Encouraged to increase Ensure to TID at home and to provide pt with softer foods. Pt denies any swallowing issues other than feeling "choked" when she drinks Ensure too quickly.   Physical assessment shows severe muscle and fat wasting to upper and lower body. Granddaughter states that she notices a difference in pt's weight from looking at pictures and feels that pt has lost 60 lbs in the past 2 years. Per chart review, pt's weight had been stable (100-110 lbs) from 04/09/16-01/01/16. Since that time, pt has lost 7 lbs (7% body weight) which is not significant for 6 month time frame.  Medications reviewed; PRN Zofran, 40 mEq oral KCl x1 dose today.  Labs reviewed; K: 3.4 mmol/L, Ca: 8.8 mg/dL.  IVF: NS @ 50 mL/hr.    Diet Order:  Diet Heart Room service appropriate? Yes; Fluid consistency: Thin  Skin:  Wound (see comment) (Stage 2 sacral pressure injury)  Last BM:  10/18  Height:   Ht Readings from Last 1 Encounters:  06/30/16 5\' 2"  (1.575 m)    Weight:   Wt Readings from Last 1 Encounters:  06/30/16 93 lb 0.6 oz (42.2 kg)    Ideal Body Weight:  50 kg  BMI:  Body mass index is 17.02 kg/m.  Estimated Nutritional Needs:   Kcal:  1265-1475 (30-35 kcal/kg)  Protein:  50-60 grams (1.2-1.4 grams/kg)  Fluid:  >/= 1.5 L/day  EDUCATION NEEDS:   No education needs identified at this time    Jarome Matin, MS, RD, LDN Inpatient Clinical Dietitian Pager # [phone] After hours/weekend pager # [phone]

## 2016-07-01 NOTE — Evaluation (Signed)
Physical Therapy Evaluation Patient Details Name: Ashlee Mack MRN: [id number] DOB: Jan 28, 1941 Today's Date: 07/01/2016   History of Present Illness   Ashlee Mack is a 75 y.o. female with hypertension hyperlipidemia and chronic kidney disease stage III and chronic anemia presents to the ER 06/30/16 because of increasing weakness, poor appetite UA consistent with UTI.   Clinical Impression  The patient is very frail. Granddaughter present and agrees to [place] and an aide. Pt admitted with above diagnosis. Pt currently with functional limitations due to the deficits listed below (see PT Problem List).  Pt will benefit from skilled PT to increase their independence and safety with mobility to allow discharge to the venue listed below.   Patient has a pressure injury of buttocks.  Follow Up Recommendations Home health PT;Supervision/Assistance - 24 hour    Equipment Recommendations  Wheelchair (measurements PT);Wheelchair cushion (measurements PT) (may benefit if family desires to have one.. Would need a youth sized one)    Recommendations for Other Services       Precautions / Restrictions Precautions Precautions: Fall Precaution Comments: incontinent Restrictions Weight Bearing Restrictions: No      Mobility  Bed Mobility Overal bed mobility: Needs Assistance Bed Mobility: Rolling;Sidelying to Sit Rolling: Min assist Sidelying to sit: Mod assist       General bed mobility comments: assist with the trunk to sitting upright.  Transfers Overall transfer level: Needs assistance Equipment used: Rolling walker (2 wheeled) Transfers: Sit to/from [facility] Sit to Stand: Mod assist Stand pivot transfers: Mod assist       General transfer comment: assist to power up from the bed, shuffle steps to recliner using RW. steady assist for balance  Ambulation/Gait                Stairs            Wheelchair Mobility    Modified Rankin (Stroke  Patients Only)       Balance Overall balance assessment: Needs assistance;History of Falls Sitting-balance support: Bilateral upper extremity supported;Feet supported       Standing balance support: During functional activity;Bilateral upper extremity supported Standing balance-Leahy Scale: Poor                               Pertinent Vitals/Pain Pain Assessment: Faces Faces Pain Scale: No hurt    Home Living Family/patient expects to be discharged to:: Private residence Living Arrangements: Spouse/significant other Available Help at Discharge: Family Type of Home: Apartment Home Access: Level entry     Home Layout: One level Home Equipment: Walker - 2 wheels      Prior Function           Comments: pt very soft spoken and husband speaking for her.  uses the RW at home .Marland Kitchen been limitied with activity,       Hand Dominance        Extremity/Trunk Assessment   Upper Extremity Assessment: RUE deficits/detail;Generalized weakness RUE Deficits / Details: decreased  shooulder elevation         Lower Extremity Assessment: Generalized weakness      Cervical / Trunk Assessment: Kyphotic  Communication   Communication:  (soft and slurred speech)  Cognition Arousal/Alertness: Awake/alert Behavior During Therapy: WFL for tasks assessed/performed Overall Cognitive Status: Within Functional Limits for tasks assessed                      General  Comments      Exercises     Assessment/Plan    PT Assessment Patient needs continued PT services  PT Problem List Decreased strength;Decreased range of motion;Decreased activity tolerance;Decreased mobility;Decreased balance;Decreased knowledge of precautions;Decreased safety awareness;Decreased knowledge of use of DME          PT Treatment Interventions DME instruction;Gait training;Therapeutic activities;Functional mobility training;Patient/family education    PT Goals (Current goals can be  found in the Care Plan section)  Acute Rehab PT Goals Patient Stated Goal: per granddaughter to return home with HHPT PT Goal Formulation: With patient/family Time For Goal Achievement: 07/15/16 Potential to Achieve Goals: Good    Frequency Min 3X/week   Barriers to discharge        Co-evaluation               End of Session   Activity Tolerance: Patient limited by fatigue Patient left: in chair;with call bell/phone within reach;with chair alarm set;with family/visitor present Nurse Communication: Mobility status    Functional Assessment Tool Used: clinical judgement Functional Limitation: Mobility: Walking and moving around Mobility: Walking and Moving Around Current Status [id number]): At least 80 percent but less than 100 percent impaired, limited or restricted Mobility: Walking and Moving Around Goal Status [phone]): At least 20 percent but less than 40 percent impaired, limited or restricted    Time: 0813-0832 PT Time Calculation (min) (ACUTE ONLY): 19 min   Charges:   PT Evaluation $PT Eval Low Complexity: 1 Procedure     PT G Codes:   PT G-Codes **NOT FOR INPATIENT CLASS** Functional Assessment Tool Used: clinical judgement Functional Limitation: Mobility: Walking and moving around Mobility: Walking and Moving Around Current Status [id number]): At least 80 percent but less than 100 percent impaired, limited or restricted Mobility: Walking and Moving Around Goal Status [phone]): At least 20 percent but less than 40 percent impaired, limited or restricted    Claretha Cooper 07/01/2016, 9:00 AM Tresa Endo PT [phone]

## 2016-07-01 NOTE — Care Management Note (Signed)
Case Management Note  Patient Details  Name: Ashlee Mack MRN: [id number] Date of Birth: 06-Jun-1941  Subjective/Objective:   Spoke to son Braggi about PACE program, & CAPS program.  [facility] will f/u on CAPS program. No further CM needs.                 Action/Plan:d/c home w/HHC.   Expected Discharge Date:   (unknown)               Expected Discharge Plan:  [place]  In-House Referral:     Discharge planning Services  CM Consult  Post Acute Care Choice:    Choice offered to:     DME Arranged:    DME Agency:     HH Arranged:  RN, PT, OT, Nurse's Aide, Social Work, CAPS Program [place] Agency:  [place]  Status of Service:  Completed, signed off  If discussed at [employer], dates discussed:    Additional Comments:  Dessa Phi, RN 07/01/2016, 3:53 PM

## 2016-07-01 NOTE — Progress Notes (Signed)
PROGRESS NOTE                                                                                                                                                                                                             Patient Demographics:    Ashlee Mack, is a 75 y.o. female, DOB - 01-07-1941, [id number]  Admit date - 06/30/2016   Admitting Physician Rise Patience, MD  Outpatient Primary MD for the patient is Lance Bosch, NP  LOS - 0  Outpatient Specialists:None  Chief Complaint  Patient presents with  . Weakness       Brief Narrative   75 year old female with hypertension, hyperlipidemia, chronic kidney disease stage III and chronic anemia presented with increased weakness with poor appetite over past few weeks. Patient found to have UTI with dehydration and observed for further management.    Subjective:    Patient informs feeling weak.   Assessment  & Plan :    Principal Problem:   Weakness generalized Secondary to UTI, failure to thrive, poor appetite with severe malnutrition and anemia. IV hydration. Empiric IV Rocephin. Nutrition consult appreciated, added supplement. PT evaluation -recommend home health with 24-hour supervision.  Active Problems: UTI As outlined above. Empiric Rocephin. Follow culture.    Essential hypertension Stable. Resume home medications    CKD (chronic kidney disease) stage 3, GFR 30-59 ml/min Renal function at baseline. Continue to monitor  Chronic microcytic anemia Iron panel suggests chronic disease, normal TSH and B12.  Stage II sacral pressure ulcer Appreciate wound care evaluation.  Hypokalemia Replenished      Code Status : DO NOT RESUSCITATE   Family Communication  : Husband and granddaughter at bedside   Disposition Plan  : Home possibly tomorrow if symptoms improve   Barriers For Discharge : Active symptoms   Consults  :  None    Procedures  : None   DVT Prophylaxis  :  Lovenox -   Lab Results  Component Value Date   PLT 833 (H) 07/01/2016    Antibiotics  :   Anti-infectives    Start     Dose/Rate Route Frequency Ordered Stop   07/01/16 1800  cefTRIAXone (ROCEPHIN) 1 g in dextrose 5 % 50 mL IVPB     1 g 100 mL/hr over 30 Minutes Intravenous Every 24 hours 06/30/16  2217     06/30/16 1830  cefTRIAXone (ROCEPHIN) 1 g in dextrose 5 % 50 mL IVPB     1 g 100 mL/hr over 30 Minutes Intravenous  Once 06/30/16 1829 06/30/16 1948        Objective:   Vitals:   07/01/16 0633 07/01/16 0735 07/01/16 0947 07/01/16 1241  BP: 135/66 115/62 130/80 118/63  Pulse: 83 80  89  Resp: 20   18  Temp: 99 F (37.2 C) 98.7 F (37.1 C)  99.2 F (37.3 C)  TempSrc: Oral Oral  Oral  SpO2: 100% 100%  99%  Weight:      Height:        Wt Readings from Last 3 Encounters:  06/30/16 42.2 kg (93 lb 0.6 oz)  01/01/16 45.6 kg (100 lb 8.5 oz)  08/26/15 48.5 kg (107 lb)     Intake/Output Summary (Last 24 hours) at 07/01/16 1352 Last data filed at 07/01/16 0915  Gross per 24 hour  Intake          1486.67 ml  Output              250 ml  Net          1236.67 ml     Physical Exam  Gen: Elderly thin built female not in distress HEENT: Pallor present, temporal wasting, moist mucosa Chest: clear b/l, no added sounds CVS: N S1&S2, no murmurs GI: soft, NT, ND, BS+, stage II sacral ulcer  Musculoskeletal: warm, no edema CNS: Alert and oriented    Data Review:    CBC  Recent Labs Lab 06/30/16 1608 06/30/16 2243 07/01/16 0527  WBC 13.6* 14.4* 11.7*  HGB 9.0* 9.2* 9.0*  HCT 28.7* 28.8* 28.7*  PLT 867* 839* 833*  MCV 74.5* 73.7* 73.2*  MCH 23.4* 23.5* 23.0*  MCHC 31.4 31.9 31.4  RDW 29.6* 29.0* 29.3*    Chemistries   Recent Labs Lab 06/30/16 1608 06/30/16 2243 07/01/16 0527  NA 140  --  140  K 3.3*  --  3.4*  CL 103  --  107  CO2 25  --  24  GLUCOSE 77  --  90  BUN 23*  --  14  CREATININE 1.00  0.86 0.89  CALCIUM 8.9  --  8.8*  AST 42*  --   --   ALT 21  --   --   ALKPHOS 58  --   --   BILITOT 0.6  --   --    ------------------------------------------------------------------------------------------------------------------ No results for input(s): CHOL, HDL, LDLCALC, TRIG, CHOLHDL, LDLDIRECT in the last 72 hours.  Lab Results  Component Value Date   HGBA1C 6.4 (H) 07/31/2015   ------------------------------------------------------------------------------------------------------------------  Recent Labs  06/30/16 2243  TSH 1.806   ------------------------------------------------------------------------------------------------------------------  Recent Labs  06/30/16 2243  VITAMINB12 521  FOLATE 23.6  FERRITIN 584*  TIBC 224*  IRON 34  RETICCTPCT 2.0    Coagulation profile No results for input(s): INR, PROTIME in the last 168 hours.  No results for input(s): DDIMER in the last 72 hours.  Cardiac Enzymes  Recent Labs Lab 06/30/16 1604 06/30/16 2243  TROPONINI <0.03 <0.03   ------------------------------------------------------------------------------------------------------------------    Component Value Date/Time   BNP 79.5 02/09/2015 2017    Inpatient Medications  Scheduled Meds: . amLODipine  10 mg Oral Daily  . atorvastatin  10 mg Oral Daily  . cefTRIAXone (ROCEPHIN)  IV  1 g Intravenous Q24H  . enoxaparin (LOVENOX) injection  30 mg Subcutaneous QHS  .  feeding supplement (ENSURE ENLIVE)  237 mL Oral TID BM  . multivitamin with minerals  1 tablet Oral Daily   Continuous Infusions: . sodium chloride 50 mL/hr at 06/30/16 2240   PRN Meds:.acetaminophen **OR** acetaminophen, ondansetron **OR** ondansetron (ZOFRAN) IV  Micro Results No results found for this or any previous visit (from the past 240 hour(s)).  Radiology Reports No results found.  Time Spent in minutes  25   Louellen Molder M.D on 07/01/2016 at 1:52 PM  Between 7am to  7pm - Pager - [phone]  After 7pm go to [email or web address] - password [facility]  [facility] -  Office  [phone]

## 2016-07-01 NOTE — Care Management Obs Status (Signed)
[place] NOTIFICATION   Patient Details  Name: Ashlee Mack MRN: [id number] Date of Birth: November 29, 1940   Medicare Observation Status Notification Given:  Yes    Dessa Phi, RN 07/01/2016, 4:04 PM

## 2016-07-01 NOTE — Consult Note (Signed)
[place] Nurse wound consult note Reason for Consult:Stage 2 pressure Injury to sacrum/ Moisture Associated skin damage(MASD) due to incontinence Wound type:Stage 2 Pressure injury/ MASD Pressure Ulcer POA: Yes Measurement:2x2x0.1cm sacrum  Lt buttocks 1 cm in diameter Wound [id number]  Drainage (amount, consistency, odor) Scant  Periwound: Intact Dressing procedure/placement/frequency:Cleanse with soap and water, Allevyn Sacral Foam change every 3 days and prn soilage. Prompt incontinence care.  Will not follow at this time.  Please re-consult if needed.  Domenic Moras RN BSN [place] Pager [phone]

## 2016-07-02 DIAGNOSIS — R531 Weakness: Secondary | ICD-10-CM

## 2016-07-02 DIAGNOSIS — N390 Urinary tract infection, site not specified: Principal | ICD-10-CM

## 2016-07-02 MED ORDER — POTASSIUM CHLORIDE ER 10 MEQ PO TBCR: 20.0000 meq | EXTENDED_RELEASE_TABLET | Freq: Every day | ORAL | 0 refills | Status: DC

## 2016-07-02 NOTE — Discharge Summary (Signed)
Ashlee Mack, is a 75 y.o. female  DOB [birth year]  MRN [id number].  Admission date:  06/30/2016  Admitting Physician  Rise Patience, MD  Discharge Date:  07/02/2016   Primary MD  Lance Bosch, NP  Recommendations for primary care physician for things to follow:  - Please check CBC, BMP during next visit   Admission Diagnosis  Dehydration [E86.0] Weakness [R53.1] Acute cystitis without hematuria [N30.00]   Discharge Diagnosis  Dehydration [E86.0] Weakness [R53.1] Acute cystitis without hematuria [N30.00]    Principal Problem:   Weakness generalized Active Problems:   Lower urinary tract infectious disease   Essential hypertension   CKD (chronic kidney disease) stage 3, GFR 30-59 ml/min   Chronic anemia   Weakness   Pressure injury of skin   Dehydration      Past Medical History:  Diagnosis Date  . CKD (chronic kidney disease) stage 3, GFR 30-59 ml/min 07/31/2015  . Coronary artery disease    Patient denies  . Hyperlipidemia   . Hypertension     Past Surgical History:  Procedure Laterality Date  . CORONARY ANGIOPLASTY WITH STENT PLACEMENT         History of present illness and  Hospital Course:     Kindly see H&P for history of present illness and admission details, please review complete Labs, Consult reports and Test reports for all details in brief  HPI  from the history and physical done on the day of admission 06/30/2016 HPI: Ashlee Mack is a 75 y.o. female with hypertension hyperlipidemia and chronic kidney disease stage III and chronic anemia presents to the ER because of increasing weakness over the last few weeks. Denies any chest pain or shortness of breath. Patient has fatigue. Has been having poor appetite last few days but denies any abdominal pain vomiting or diarrhea. In the ER patient's UA shows features consistent with UTI. Patient was initially mildly  tachycardic for which patient was given fluid bolus. Patient is being admitted for generalized weakness and UTI.   ED Course: Patient started on antibiotics and fluids.   Hospital Course   75 year old female with hypertension, hyperlipidemia, chronic kidney disease stage III and chronic anemia presented with increased weakness with poor appetite over past few weeks. Patient found to have UTI with dehydration and observed for further management.  Weakness generalized - Secondary to UTI, failure to thrive, poor appetite with severe malnutrition and anemia. - Treated with IV hydration. Empiric IV Rocephin. Nutrition consult appreciated, continue supplement - Home with home PT on discharge   UTI - Treated with IV Rocephin during hospital stay, received total of 3 doses, no need for further antibiotics on discharge, blood cultures negative to date at time of discharge  Essential hypertension Stable. Continue home medications    CKD (chronic kidney disease) stage 3, GFR 30-59 ml/min Renal function at baseline. Continue to monitor  Chronic microcytic anemia Iron panel suggests chronic disease, normal TSH and B12.  Stage II sacral pressure ulcer Appreciate wound care evaluation.  Hypokalemia  Replenished, will discharge and supplements  Discharge Condition: Stable  Follow UP  Follow-up Information    Lance Bosch, NP .   Specialty:  Internal Medicine Why:  [place] nursing, physical therapy, occupational therapy,social worker/aide       [place] .   Why:  [place] nursing,physical/occupational therapy,aide/social Web designer information: [place] [id number] [phone]             Discharge Instructions  and  Discharge Medications     Discharge Instructions    Discharge instructions    Complete by:  As directed    Follow with Primary MD Lance Bosch, NP in 7 days   Get CBC, CMP,  checked  by Primary MD next  visit.    Activity: As tolerated with Full fall precautions use walker/cane & assistance as needed   Disposition Home   Diet: Heart Healthy  , with feeding assistance and aspiration precautions.  For Heart failure patients - Check your Weight same time everyday, if you gain over 2 pounds, or you develop in leg swelling, experience more shortness of breath or chest pain, call your Primary MD immediately. Follow Cardiac Low Salt Diet and 1.5 lit/day fluid restriction.   On your next visit with your primary care physician please Get Medicines reviewed and adjusted.   Please request your Prim.MD to go over all Hospital Tests and Procedure/Radiological results at the follow up, please get all Hospital records sent to your Prim MD by signing hospital release before you go home.   If you experience worsening of your admission symptoms, develop shortness of breath, life threatening emergency, suicidal or homicidal thoughts you must seek medical attention immediately by calling 911 or calling your MD immediately  if symptoms less severe.  You Must read complete instructions/literature along with all the possible adverse reactions/side effects for all the Medicines you take and that have been prescribed to you. Take any new Medicines after you have completely understood and accpet all the possible adverse reactions/side effects.   Do not drive, operating heavy machinery, perform activities at heights, swimming or participation in water activities or provide baby sitting services if your were admitted for syncope or siezures until you have seen by Primary MD or a Neurologist and advised to do so again.  Do not drive when taking Pain medications.    Do not take more than prescribed Pain, Sleep and Anxiety Medications  Special Instructions: If you have smoked or chewed Tobacco  in the last 2 yrs please stop smoking, stop any regular Alcohol  and or any Recreational drug use.  Wear Seat belts while  driving.   Please note  You were cared for by a hospitalist during your hospital stay. If you have any questions about your discharge medications or the care you received while you were in the hospital after you are discharged, you can call the unit and asked to speak with the hospitalist on call if the hospitalist that took care of you is not available. Once you are discharged, your primary care physician will handle any further medical issues. Please note that NO REFILLS for any discharge medications will be authorized once you are discharged, as it is imperative that you return to your primary care physician (or establish a relationship with a primary care physician if you do not have one) for your aftercare needs so that they can reassess your need for medications and monitor your lab values.   Increase  activity slowly    Complete by:  As directed        Medication List    TAKE these medications   amLODipine 10 MG tablet Commonly known as:  NORVASC Take 1 tablet (10 mg total) by mouth daily.   atorvastatin 10 MG tablet Commonly known as:  LIPITOR Take 1 tablet (10 mg total) by mouth daily.   feeding supplement Liqd Take 1 Container by mouth 2 (two) times daily between meals.   potassium chloride 10 MEQ tablet Commonly known as:  K-DUR Take 2 tablets (20 mEq total) by mouth daily.         Diet and Activity recommendation: See Discharge Instructions above   Consults obtained -  None   Major procedures and Radiology Reports - PLEASE review detailed and final reports for all details, in brief -    No results found.  Micro Results     Recent Results (from the past 240 hour(s))  Blood culture (routine x 2)     Status: None (Preliminary result)   Collection Time: 06/30/16  7:53 PM  Result Value Ref Range Status   Specimen Description BLOOD LEFT ANTECUBITAL  Final   Special Requests BOTTLES DRAWN AEROBIC AND ANAEROBIC 5CC EACH  Final   Culture   Final    NO GROWTH  2 DAYS Performed at [facility]    Report Status PENDING  Incomplete  Blood culture (routine x 2)     Status: None (Preliminary result)   Collection Time: 06/30/16 10:45 PM  Result Value Ref Range Status   Specimen Description BLOOD L ARM  Final   Special Requests BOTTLES DRAWN AEROBIC AND ANAEROBIC [place] EA  Final   Culture   Final    NO GROWTH 1 DAY Performed at [facility]    Report Status PENDING  Incomplete       Today   Subjective:   Ashlee Mack today has no headache,no chest abdominal pain,n feels much better today  Objective:   Blood pressure 122/75, pulse 79, temperature 98.4 F (36.9 C), temperature source Oral, resp. rate (!) 27, height 5\' 2"  (1.575 m), weight 42.2 kg (93 lb 0.6 oz), SpO2 100 %.   Intake/Output Summary (Last 24 hours) at 07/02/16 1126 Last data filed at 07/02/16 0900  Gross per 24 hour  Intake             1140 ml  Output                0 ml  Net             1140 ml    Exam Gen: Elderly thin built female not in distress HEENT: Pallor present, temporal wasting, moist mucosa Chest: clear b/l, no added sounds CVS: N S1&S2, no murmurs GI: soft, NT, ND, BS+, stage II sacral ulcer  Musculoskeletal: warm, no edema CNS: Alert , Communicative, pleasant  Data Review   CBC w Diff:  Lab Results  Component Value Date   WBC 11.7 (H) 07/01/2016   HGB 9.0 (L) 07/01/2016   HCT 28.7 (L) 07/01/2016   PLT 833 (H) 07/01/2016   LYMPHOPCT 19 01/01/2016   MONOPCT 14 01/01/2016   EOSPCT 0 01/01/2016   BASOPCT 2 01/01/2016    CMP:  Lab Results  Component Value Date   NA 140 07/01/2016   K 3.4 (L) 07/01/2016   CL 107 07/01/2016   CO2 24 07/01/2016   BUN 14 07/01/2016   CREATININE 0.89 07/01/2016  CREATININE 1.13 (H) 08/13/2015   PROT 8.3 (H) 06/30/2016   ALBUMIN 3.9 06/30/2016   BILITOT 0.6 06/30/2016   ALKPHOS 58 06/30/2016   AST 42 (H) 06/30/2016   ALT 21 06/30/2016  .   Total Time in preparing paper work, data  evaluation and todays exam - 35 minutes  Ashlee Mack M.D on 07/02/2016 at 11:26 AM  [facility]   Office  [phone]

## 2016-07-02 NOTE — Care Management Note (Signed)
Case Management Note  Patient Details  Name: Ashlee Mack MRN: [id number] Date of Birth: Feb 09, 1941  Subjective/Objective:spoke to spouse about pcp-they agree to [facility] for pcp-[facility] rep Janett Billow will set up pcp appt-see f/u section of d/c.                     Action/Plan:d/c home w/HHC   Expected Discharge Date:   (unknown)               Expected Discharge Plan:  [place]  In-House Referral:     Discharge planning Services  CM Consult, [facility]  Post Acute Care Choice:    Choice offered to:     DME Arranged:    DME Agency:     HH Arranged:  RN, PT, OT, Nurse's Aide, Social Work, CAPS Program [place] Agency:  [place]  Status of Service:  Completed, signed off  If discussed at [employer], dates discussed:    Additional Comments:  Dessa Phi, RN 07/02/2016, 2:50 PM

## 2016-07-02 NOTE — Hospital Discharge Follow-Up (Signed)
[place] [facility] [place]:  This Case Manager received communication from [place], [place] ED Case Manager, that patient needing hospital follow-up appointment. Patient known to [place]; however, her listed PCP is no longer at practice. This Case Manager placed call to Dessa Phi, RN CM who indicated patient agreeable to hospital follow-up appointment at [place]. Appointment scheduled for 07/09/16 at 1030 with Freeman Caldron, PA. AVS updated. Dessa Phi, RN CM updated and appreciative.

## 2016-07-02 NOTE — Discharge Instructions (Signed)
Follow with Primary MD Valerie A Keck, NP in 7 days  ° °Get CBC, CMP, checked  by Primary MD next visit.  ° ° °Activity: As tolerated with Full fall precautions use walker/cane & assistance as needed ° ° °Disposition Home  ° ° °Diet: Heart Healthy , with feeding assistance and aspiration precautions. ° °For Heart failure patients - Check your Weight same time everyday, if you gain over 2 pounds, or you develop in leg swelling, experience more shortness of breath or chest pain, call your Primary MD immediately. Follow Cardiac Low Salt Diet and 1.5 lit/day fluid restriction. ° ° °On your next visit with your primary care physician please Get Medicines reviewed and adjusted. ° ° °Please request your Prim.MD to go over all Hospital Tests and Procedure/Radiological results at the follow up, please get all Hospital records sent to your Prim MD by signing hospital release before you go home. ° ° °If you experience worsening of your admission symptoms, develop shortness of breath, life threatening emergency, suicidal or homicidal thoughts you must seek medical attention immediately by calling 911 or calling your MD immediately  if symptoms less severe. ° °You Must read complete instructions/literature along with all the possible adverse reactions/side effects for all the Medicines you take and that have been prescribed to you. Take any new Medicines after you have completely understood and accpet all the possible adverse reactions/side effects.  ° °Do not drive, operating heavy machinery, perform activities at heights, swimming or participation in water activities or provide baby sitting services if your were admitted for syncope or siezures until you have seen by Primary MD or a Neurologist and advised to do so again. ° °Do not drive when taking Pain medications.  ° ° °Do not take more than prescribed Pain, Sleep and Anxiety Medications ° °Special Instructions: If you have smoked or chewed Tobacco  in the last 2 yrs please  stop smoking, stop any regular Alcohol  and or any Recreational drug use. ° °Wear Seat belts while driving. ° ° °Please note ° °You were cared for by a hospitalist during your hospital stay. If you have any questions about your discharge medications or the care you received while you were in the hospital after you are discharged, you can call the unit and asked to speak with the hospitalist on call if the hospitalist that took care of you is not available. Once you are discharged, your primary care physician will handle any further medical issues. Please note that NO REFILLS for any discharge medications will be authorized once you are discharged, as it is imperative that you return to your primary care physician (or establish a relationship with a primary care physician if you do not have one) for your aftercare needs so that they can reassess your need for medications and monitor your lab values. ° °

## 2016-07-04 DIAGNOSIS — N183 Chronic kidney disease, stage 3 (moderate): Secondary | ICD-10-CM | POA: Diagnosis not present

## 2016-07-04 DIAGNOSIS — E785 Hyperlipidemia, unspecified: Secondary | ICD-10-CM | POA: Diagnosis not present

## 2016-07-04 DIAGNOSIS — Z87440 Personal history of urinary (tract) infections: Secondary | ICD-10-CM | POA: Diagnosis not present

## 2016-07-04 DIAGNOSIS — I2510 Atherosclerotic heart disease of native coronary artery without angina pectoris: Secondary | ICD-10-CM | POA: Diagnosis not present

## 2016-07-04 DIAGNOSIS — D649 Anemia, unspecified: Secondary | ICD-10-CM | POA: Diagnosis not present

## 2016-07-04 DIAGNOSIS — Z955 Presence of coronary angioplasty implant and graft: Secondary | ICD-10-CM | POA: Diagnosis not present

## 2016-07-04 DIAGNOSIS — I129 Hypertensive chronic kidney disease with stage 1 through stage 4 chronic kidney disease, or unspecified chronic kidney disease: Secondary | ICD-10-CM | POA: Diagnosis not present

## 2016-07-05 ENCOUNTER — Telehealth: Payer: Self-pay | Admitting: Internal Medicine

## 2016-07-05 DIAGNOSIS — I2510 Atherosclerotic heart disease of native coronary artery without angina pectoris: Secondary | ICD-10-CM | POA: Diagnosis not present

## 2016-07-05 DIAGNOSIS — N183 Chronic kidney disease, stage 3 (moderate): Secondary | ICD-10-CM | POA: Diagnosis not present

## 2016-07-05 DIAGNOSIS — D649 Anemia, unspecified: Secondary | ICD-10-CM | POA: Diagnosis not present

## 2016-07-05 DIAGNOSIS — Z87440 Personal history of urinary (tract) infections: Secondary | ICD-10-CM | POA: Diagnosis not present

## 2016-07-05 DIAGNOSIS — E785 Hyperlipidemia, unspecified: Secondary | ICD-10-CM | POA: Diagnosis not present

## 2016-07-05 DIAGNOSIS — I129 Hypertensive chronic kidney disease with stage 1 through stage 4 chronic kidney disease, or unspecified chronic kidney disease: Secondary | ICD-10-CM | POA: Diagnosis not present

## 2016-07-05 LAB — CULTURE, BLOOD (ROUTINE X 2): Culture: NO GROWTH

## 2016-07-05 NOTE — Telephone Encounter (Signed)
Rosann Auerbach from [facility] called requesting OT orders for 2 times a week for one week and and 1 time a week for 3 weeks. Please f/u

## 2016-07-06 DIAGNOSIS — D649 Anemia, unspecified: Secondary | ICD-10-CM | POA: Diagnosis not present

## 2016-07-06 DIAGNOSIS — N183 Chronic kidney disease, stage 3 (moderate): Secondary | ICD-10-CM | POA: Diagnosis not present

## 2016-07-06 DIAGNOSIS — I129 Hypertensive chronic kidney disease with stage 1 through stage 4 chronic kidney disease, or unspecified chronic kidney disease: Secondary | ICD-10-CM | POA: Diagnosis not present

## 2016-07-06 DIAGNOSIS — I2510 Atherosclerotic heart disease of native coronary artery without angina pectoris: Secondary | ICD-10-CM | POA: Diagnosis not present

## 2016-07-06 DIAGNOSIS — E785 Hyperlipidemia, unspecified: Secondary | ICD-10-CM | POA: Diagnosis not present

## 2016-07-06 DIAGNOSIS — Z87440 Personal history of urinary (tract) infections: Secondary | ICD-10-CM | POA: Diagnosis not present

## 2016-07-06 LAB — CULTURE, BLOOD (ROUTINE X 2): Culture: NO GROWTH

## 2016-07-07 DIAGNOSIS — Z87440 Personal history of urinary (tract) infections: Secondary | ICD-10-CM | POA: Diagnosis not present

## 2016-07-07 DIAGNOSIS — N183 Chronic kidney disease, stage 3 (moderate): Secondary | ICD-10-CM | POA: Diagnosis not present

## 2016-07-07 DIAGNOSIS — D649 Anemia, unspecified: Secondary | ICD-10-CM | POA: Diagnosis not present

## 2016-07-07 DIAGNOSIS — E785 Hyperlipidemia, unspecified: Secondary | ICD-10-CM | POA: Diagnosis not present

## 2016-07-07 DIAGNOSIS — I129 Hypertensive chronic kidney disease with stage 1 through stage 4 chronic kidney disease, or unspecified chronic kidney disease: Secondary | ICD-10-CM | POA: Diagnosis not present

## 2016-07-07 DIAGNOSIS — I2510 Atherosclerotic heart disease of native coronary artery without angina pectoris: Secondary | ICD-10-CM | POA: Diagnosis not present

## 2016-07-08 DIAGNOSIS — I129 Hypertensive chronic kidney disease with stage 1 through stage 4 chronic kidney disease, or unspecified chronic kidney disease: Secondary | ICD-10-CM | POA: Diagnosis not present

## 2016-07-08 DIAGNOSIS — D649 Anemia, unspecified: Secondary | ICD-10-CM | POA: Diagnosis not present

## 2016-07-08 DIAGNOSIS — Z87440 Personal history of urinary (tract) infections: Secondary | ICD-10-CM | POA: Diagnosis not present

## 2016-07-08 DIAGNOSIS — N183 Chronic kidney disease, stage 3 (moderate): Secondary | ICD-10-CM | POA: Diagnosis not present

## 2016-07-08 DIAGNOSIS — I2510 Atherosclerotic heart disease of native coronary artery without angina pectoris: Secondary | ICD-10-CM | POA: Diagnosis not present

## 2016-07-08 DIAGNOSIS — E785 Hyperlipidemia, unspecified: Secondary | ICD-10-CM | POA: Diagnosis not present

## 2016-07-08 NOTE — Telephone Encounter (Signed)
Ashlee Mack from [facility] called to PCP to get verbal order to for PT, OT and nurse visit for patient. Once verbal are given a fax will be sent for PCP to sign.

## 2016-07-09 ENCOUNTER — Inpatient Hospital Stay: Payer: Medicare Other

## 2016-07-09 DIAGNOSIS — I2510 Atherosclerotic heart disease of native coronary artery without angina pectoris: Secondary | ICD-10-CM | POA: Diagnosis not present

## 2016-07-09 DIAGNOSIS — Z87440 Personal history of urinary (tract) infections: Secondary | ICD-10-CM | POA: Diagnosis not present

## 2016-07-09 DIAGNOSIS — E785 Hyperlipidemia, unspecified: Secondary | ICD-10-CM | POA: Diagnosis not present

## 2016-07-09 DIAGNOSIS — I129 Hypertensive chronic kidney disease with stage 1 through stage 4 chronic kidney disease, or unspecified chronic kidney disease: Secondary | ICD-10-CM | POA: Diagnosis not present

## 2016-07-09 DIAGNOSIS — D649 Anemia, unspecified: Secondary | ICD-10-CM | POA: Diagnosis not present

## 2016-07-09 DIAGNOSIS — N183 Chronic kidney disease, stage 3 (moderate): Secondary | ICD-10-CM | POA: Diagnosis not present

## 2016-07-09 NOTE — Telephone Encounter (Signed)
Please schedule patient for appointment or inform them of time frame to call for appointments in order to receive referral after assessment.

## 2016-07-09 NOTE — Telephone Encounter (Signed)
Patient has not been seen in almost a year, needs appointment for assessment and then referral.

## 2016-07-09 NOTE — Telephone Encounter (Signed)
Patient has been seeing Ashlee Mack and is now needing VO for PT and OT. May I provide this?

## 2016-07-11 DIAGNOSIS — I129 Hypertensive chronic kidney disease with stage 1 through stage 4 chronic kidney disease, or unspecified chronic kidney disease: Secondary | ICD-10-CM | POA: Diagnosis not present

## 2016-07-11 DIAGNOSIS — Z87440 Personal history of urinary (tract) infections: Secondary | ICD-10-CM | POA: Diagnosis not present

## 2016-07-11 DIAGNOSIS — N183 Chronic kidney disease, stage 3 (moderate): Secondary | ICD-10-CM | POA: Diagnosis not present

## 2016-07-11 DIAGNOSIS — D649 Anemia, unspecified: Secondary | ICD-10-CM | POA: Diagnosis not present

## 2016-07-11 DIAGNOSIS — I2510 Atherosclerotic heart disease of native coronary artery without angina pectoris: Secondary | ICD-10-CM | POA: Diagnosis not present

## 2016-07-11 DIAGNOSIS — E785 Hyperlipidemia, unspecified: Secondary | ICD-10-CM | POA: Diagnosis not present

## 2016-07-13 DIAGNOSIS — Z87440 Personal history of urinary (tract) infections: Secondary | ICD-10-CM | POA: Diagnosis not present

## 2016-07-13 DIAGNOSIS — N183 Chronic kidney disease, stage 3 (moderate): Secondary | ICD-10-CM | POA: Diagnosis not present

## 2016-07-13 DIAGNOSIS — E785 Hyperlipidemia, unspecified: Secondary | ICD-10-CM | POA: Diagnosis not present

## 2016-07-13 DIAGNOSIS — D649 Anemia, unspecified: Secondary | ICD-10-CM | POA: Diagnosis not present

## 2016-07-13 DIAGNOSIS — I2510 Atherosclerotic heart disease of native coronary artery without angina pectoris: Secondary | ICD-10-CM | POA: Diagnosis not present

## 2016-07-13 DIAGNOSIS — I129 Hypertensive chronic kidney disease with stage 1 through stage 4 chronic kidney disease, or unspecified chronic kidney disease: Secondary | ICD-10-CM | POA: Diagnosis not present

## 2016-07-15 DIAGNOSIS — Z87440 Personal history of urinary (tract) infections: Secondary | ICD-10-CM | POA: Diagnosis not present

## 2016-07-15 DIAGNOSIS — E785 Hyperlipidemia, unspecified: Secondary | ICD-10-CM | POA: Diagnosis not present

## 2016-07-15 DIAGNOSIS — D649 Anemia, unspecified: Secondary | ICD-10-CM | POA: Diagnosis not present

## 2016-07-15 DIAGNOSIS — I129 Hypertensive chronic kidney disease with stage 1 through stage 4 chronic kidney disease, or unspecified chronic kidney disease: Secondary | ICD-10-CM | POA: Diagnosis not present

## 2016-07-15 DIAGNOSIS — N183 Chronic kidney disease, stage 3 (moderate): Secondary | ICD-10-CM | POA: Diagnosis not present

## 2016-07-15 DIAGNOSIS — I2510 Atherosclerotic heart disease of native coronary artery without angina pectoris: Secondary | ICD-10-CM | POA: Diagnosis not present

## 2016-07-19 DIAGNOSIS — I2510 Atherosclerotic heart disease of native coronary artery without angina pectoris: Secondary | ICD-10-CM | POA: Diagnosis not present

## 2016-07-19 DIAGNOSIS — D649 Anemia, unspecified: Secondary | ICD-10-CM | POA: Diagnosis not present

## 2016-07-19 DIAGNOSIS — E785 Hyperlipidemia, unspecified: Secondary | ICD-10-CM | POA: Diagnosis not present

## 2016-07-19 DIAGNOSIS — N183 Chronic kidney disease, stage 3 (moderate): Secondary | ICD-10-CM | POA: Diagnosis not present

## 2016-07-19 DIAGNOSIS — Z87440 Personal history of urinary (tract) infections: Secondary | ICD-10-CM | POA: Diagnosis not present

## 2016-07-19 DIAGNOSIS — I129 Hypertensive chronic kidney disease with stage 1 through stage 4 chronic kidney disease, or unspecified chronic kidney disease: Secondary | ICD-10-CM | POA: Diagnosis not present

## 2016-07-20 DIAGNOSIS — I129 Hypertensive chronic kidney disease with stage 1 through stage 4 chronic kidney disease, or unspecified chronic kidney disease: Secondary | ICD-10-CM | POA: Diagnosis not present

## 2016-07-20 DIAGNOSIS — Z87440 Personal history of urinary (tract) infections: Secondary | ICD-10-CM | POA: Diagnosis not present

## 2016-07-20 DIAGNOSIS — D649 Anemia, unspecified: Secondary | ICD-10-CM | POA: Diagnosis not present

## 2016-07-20 DIAGNOSIS — I2510 Atherosclerotic heart disease of native coronary artery without angina pectoris: Secondary | ICD-10-CM | POA: Diagnosis not present

## 2016-07-20 DIAGNOSIS — N183 Chronic kidney disease, stage 3 (moderate): Secondary | ICD-10-CM | POA: Diagnosis not present

## 2016-07-20 DIAGNOSIS — E785 Hyperlipidemia, unspecified: Secondary | ICD-10-CM | POA: Diagnosis not present

## 2016-07-21 DIAGNOSIS — D649 Anemia, unspecified: Secondary | ICD-10-CM | POA: Diagnosis not present

## 2016-07-21 DIAGNOSIS — I129 Hypertensive chronic kidney disease with stage 1 through stage 4 chronic kidney disease, or unspecified chronic kidney disease: Secondary | ICD-10-CM | POA: Diagnosis not present

## 2016-07-21 DIAGNOSIS — N183 Chronic kidney disease, stage 3 (moderate): Secondary | ICD-10-CM | POA: Diagnosis not present

## 2016-07-21 DIAGNOSIS — Z87440 Personal history of urinary (tract) infections: Secondary | ICD-10-CM | POA: Diagnosis not present

## 2016-07-21 DIAGNOSIS — E785 Hyperlipidemia, unspecified: Secondary | ICD-10-CM | POA: Diagnosis not present

## 2016-07-21 DIAGNOSIS — I2510 Atherosclerotic heart disease of native coronary artery without angina pectoris: Secondary | ICD-10-CM | POA: Diagnosis not present

## 2016-07-22 DIAGNOSIS — E785 Hyperlipidemia, unspecified: Secondary | ICD-10-CM | POA: Diagnosis not present

## 2016-07-22 DIAGNOSIS — N183 Chronic kidney disease, stage 3 (moderate): Secondary | ICD-10-CM | POA: Diagnosis not present

## 2016-07-22 DIAGNOSIS — Z87440 Personal history of urinary (tract) infections: Secondary | ICD-10-CM | POA: Diagnosis not present

## 2016-07-22 DIAGNOSIS — I129 Hypertensive chronic kidney disease with stage 1 through stage 4 chronic kidney disease, or unspecified chronic kidney disease: Secondary | ICD-10-CM | POA: Diagnosis not present

## 2016-07-22 DIAGNOSIS — D649 Anemia, unspecified: Secondary | ICD-10-CM | POA: Diagnosis not present

## 2016-07-22 DIAGNOSIS — I2510 Atherosclerotic heart disease of native coronary artery without angina pectoris: Secondary | ICD-10-CM | POA: Diagnosis not present

## 2016-07-23 DIAGNOSIS — Z87440 Personal history of urinary (tract) infections: Secondary | ICD-10-CM | POA: Diagnosis not present

## 2016-07-23 DIAGNOSIS — I2510 Atherosclerotic heart disease of native coronary artery without angina pectoris: Secondary | ICD-10-CM | POA: Diagnosis not present

## 2016-07-23 DIAGNOSIS — N183 Chronic kidney disease, stage 3 (moderate): Secondary | ICD-10-CM | POA: Diagnosis not present

## 2016-07-23 DIAGNOSIS — I129 Hypertensive chronic kidney disease with stage 1 through stage 4 chronic kidney disease, or unspecified chronic kidney disease: Secondary | ICD-10-CM | POA: Diagnosis not present

## 2016-07-23 DIAGNOSIS — D649 Anemia, unspecified: Secondary | ICD-10-CM | POA: Diagnosis not present

## 2016-07-23 DIAGNOSIS — E785 Hyperlipidemia, unspecified: Secondary | ICD-10-CM | POA: Diagnosis not present

## 2016-07-26 ENCOUNTER — Telehealth: Payer: Self-pay | Admitting: Internal Medicine

## 2016-07-26 ENCOUNTER — Telehealth: Payer: Self-pay

## 2016-07-26 DIAGNOSIS — Z87440 Personal history of urinary (tract) infections: Secondary | ICD-10-CM | POA: Diagnosis not present

## 2016-07-26 DIAGNOSIS — I2510 Atherosclerotic heart disease of native coronary artery without angina pectoris: Secondary | ICD-10-CM | POA: Diagnosis not present

## 2016-07-26 DIAGNOSIS — N183 Chronic kidney disease, stage 3 (moderate): Secondary | ICD-10-CM | POA: Diagnosis not present

## 2016-07-26 DIAGNOSIS — I129 Hypertensive chronic kidney disease with stage 1 through stage 4 chronic kidney disease, or unspecified chronic kidney disease: Secondary | ICD-10-CM | POA: Diagnosis not present

## 2016-07-26 DIAGNOSIS — D649 Anemia, unspecified: Secondary | ICD-10-CM | POA: Diagnosis not present

## 2016-07-26 DIAGNOSIS — E785 Hyperlipidemia, unspecified: Secondary | ICD-10-CM | POA: Diagnosis not present

## 2016-07-26 NOTE — Telephone Encounter (Signed)
Call placed to [facility] with [place] # [phone] regarding documentation that she is requesting about a wheelchair. Informed her that the patient needs to schedule an appointment with her provider before the documentation can be completed. It has been almost a year since the patient has been seen in the clinic. She verbalized understanding and noted the she would inform the patient of the need to schedule an appointment with her provider if she speaks to her.  [place], [place] to contact [facility] scheduler regarding scheduling an appointment for the patient with Dr Jarold Song if there is any availability on the schedule.

## 2016-07-26 NOTE — Telephone Encounter (Signed)
Ashlee Mack with [facility] called stated she received signed order, but missing documentation. Stated she faxed a note last week stating what documentation she needs. Please f/u

## 2016-07-27 DIAGNOSIS — I2510 Atherosclerotic heart disease of native coronary artery without angina pectoris: Secondary | ICD-10-CM | POA: Diagnosis not present

## 2016-07-27 DIAGNOSIS — N183 Chronic kidney disease, stage 3 (moderate): Secondary | ICD-10-CM | POA: Diagnosis not present

## 2016-07-27 DIAGNOSIS — E785 Hyperlipidemia, unspecified: Secondary | ICD-10-CM | POA: Diagnosis not present

## 2016-07-27 DIAGNOSIS — Z87440 Personal history of urinary (tract) infections: Secondary | ICD-10-CM | POA: Diagnosis not present

## 2016-07-27 DIAGNOSIS — D649 Anemia, unspecified: Secondary | ICD-10-CM | POA: Diagnosis not present

## 2016-07-27 DIAGNOSIS — I129 Hypertensive chronic kidney disease with stage 1 through stage 4 chronic kidney disease, or unspecified chronic kidney disease: Secondary | ICD-10-CM | POA: Diagnosis not present

## 2016-07-29 DIAGNOSIS — I129 Hypertensive chronic kidney disease with stage 1 through stage 4 chronic kidney disease, or unspecified chronic kidney disease: Secondary | ICD-10-CM | POA: Diagnosis not present

## 2016-07-29 DIAGNOSIS — E785 Hyperlipidemia, unspecified: Secondary | ICD-10-CM | POA: Diagnosis not present

## 2016-07-29 DIAGNOSIS — I2510 Atherosclerotic heart disease of native coronary artery without angina pectoris: Secondary | ICD-10-CM | POA: Diagnosis not present

## 2016-07-29 DIAGNOSIS — Z87440 Personal history of urinary (tract) infections: Secondary | ICD-10-CM | POA: Diagnosis not present

## 2016-07-29 DIAGNOSIS — D649 Anemia, unspecified: Secondary | ICD-10-CM | POA: Diagnosis not present

## 2016-07-29 DIAGNOSIS — N183 Chronic kidney disease, stage 3 (moderate): Secondary | ICD-10-CM | POA: Diagnosis not present

## 2016-08-03 DIAGNOSIS — D649 Anemia, unspecified: Secondary | ICD-10-CM | POA: Diagnosis not present

## 2016-08-03 DIAGNOSIS — N183 Chronic kidney disease, stage 3 (moderate): Secondary | ICD-10-CM | POA: Diagnosis not present

## 2016-08-03 DIAGNOSIS — I2510 Atherosclerotic heart disease of native coronary artery without angina pectoris: Secondary | ICD-10-CM | POA: Diagnosis not present

## 2016-08-03 DIAGNOSIS — E785 Hyperlipidemia, unspecified: Secondary | ICD-10-CM | POA: Diagnosis not present

## 2016-08-03 DIAGNOSIS — Z87440 Personal history of urinary (tract) infections: Secondary | ICD-10-CM | POA: Diagnosis not present

## 2016-08-03 DIAGNOSIS — I129 Hypertensive chronic kidney disease with stage 1 through stage 4 chronic kidney disease, or unspecified chronic kidney disease: Secondary | ICD-10-CM | POA: Diagnosis not present

## 2016-08-06 DIAGNOSIS — N183 Chronic kidney disease, stage 3 (moderate): Secondary | ICD-10-CM | POA: Diagnosis not present

## 2016-08-06 DIAGNOSIS — E785 Hyperlipidemia, unspecified: Secondary | ICD-10-CM | POA: Diagnosis not present

## 2016-08-06 DIAGNOSIS — Z87440 Personal history of urinary (tract) infections: Secondary | ICD-10-CM | POA: Diagnosis not present

## 2016-08-06 DIAGNOSIS — I2510 Atherosclerotic heart disease of native coronary artery without angina pectoris: Secondary | ICD-10-CM | POA: Diagnosis not present

## 2016-08-06 DIAGNOSIS — D649 Anemia, unspecified: Secondary | ICD-10-CM | POA: Diagnosis not present

## 2016-08-06 DIAGNOSIS — I129 Hypertensive chronic kidney disease with stage 1 through stage 4 chronic kidney disease, or unspecified chronic kidney disease: Secondary | ICD-10-CM | POA: Diagnosis not present

## 2016-09-08 ENCOUNTER — Telehealth: Payer: Self-pay | Admitting: Family Medicine

## 2016-09-08 MED ORDER — ATORVASTATIN CALCIUM 10 MG PO TABS: 10.0000 mg | ORAL_TABLET | Freq: Every day | ORAL | 0 refills | Status: DC

## 2016-09-08 MED ORDER — AMLODIPINE BESYLATE 10 MG PO TABS: 10.0000 mg | ORAL_TABLET | Freq: Every day | ORAL | 0 refills | Status: DC

## 2016-09-08 NOTE — Telephone Encounter (Signed)
Requested medications refilled x 30 days - must have appt next month

## 2016-09-08 NOTE — Telephone Encounter (Signed)
Patient is requesting prescription refill for BP and Cholesterol.  Patient is aware that she is to call Jan 2 @ 8:30am to schedule appt.

## 2016-10-01 ENCOUNTER — Ambulatory Visit: Payer: Medicare Other | Admitting: Family Medicine

## 2016-10-18 ENCOUNTER — Telehealth: Payer: Self-pay

## 2016-10-18 ENCOUNTER — Ambulatory Visit: Payer: Medicare Other | Attending: Family Medicine | Admitting: Family Medicine

## 2016-10-18 ENCOUNTER — Encounter: Payer: Self-pay | Admitting: Family Medicine

## 2016-10-18 VITALS — BP 145/75 | HR 100 | Temp 98.7°F | Wt 93.2 lb

## 2016-10-18 DIAGNOSIS — N183 Chronic kidney disease, stage 3 (moderate): Secondary | ICD-10-CM | POA: Insufficient documentation

## 2016-10-18 DIAGNOSIS — R630 Anorexia: Secondary | ICD-10-CM | POA: Diagnosis not present

## 2016-10-18 DIAGNOSIS — F0280 Dementia in other diseases classified elsewhere without behavioral disturbance: Secondary | ICD-10-CM

## 2016-10-18 DIAGNOSIS — I10 Essential (primary) hypertension: Secondary | ICD-10-CM

## 2016-10-18 DIAGNOSIS — I129 Hypertensive chronic kidney disease with stage 1 through stage 4 chronic kidney disease, or unspecified chronic kidney disease: Secondary | ICD-10-CM | POA: Diagnosis not present

## 2016-10-18 DIAGNOSIS — E785 Hyperlipidemia, unspecified: Secondary | ICD-10-CM | POA: Insufficient documentation

## 2016-10-18 DIAGNOSIS — I2510 Atherosclerotic heart disease of native coronary artery without angina pectoris: Secondary | ICD-10-CM | POA: Insufficient documentation

## 2016-10-18 DIAGNOSIS — R32 Unspecified urinary incontinence: Secondary | ICD-10-CM | POA: Insufficient documentation

## 2016-10-18 DIAGNOSIS — Z955 Presence of coronary angioplasty implant and graft: Secondary | ICD-10-CM | POA: Insufficient documentation

## 2016-10-18 DIAGNOSIS — R159 Full incontinence of feces: Secondary | ICD-10-CM | POA: Diagnosis not present

## 2016-10-18 DIAGNOSIS — L89151 Pressure ulcer of sacral region, stage 1: Secondary | ICD-10-CM | POA: Insufficient documentation

## 2016-10-18 DIAGNOSIS — G308 Other Alzheimer's disease: Secondary | ICD-10-CM

## 2016-10-18 DIAGNOSIS — E7800 Pure hypercholesterolemia, unspecified: Secondary | ICD-10-CM | POA: Diagnosis not present

## 2016-10-18 DIAGNOSIS — F0390 Unspecified dementia without behavioral disturbance: Secondary | ICD-10-CM | POA: Insufficient documentation

## 2016-10-18 MED ORDER — ATORVASTATIN CALCIUM 10 MG PO TABS: 10.0000 mg | ORAL_TABLET | Freq: Every day | ORAL | 6 refills | Status: AC

## 2016-10-18 MED ORDER — AMLODIPINE BESYLATE 10 MG PO TABS: 10.0000 mg | ORAL_TABLET | Freq: Every day | ORAL | 6 refills | Status: DC

## 2016-10-18 MED ORDER — BACITRACIN 500 UNIT/GM EX OINT: 1.0000 "application " | TOPICAL_OINTMENT | Freq: Two times a day (BID) | CUTANEOUS | 2 refills | Status: DC

## 2016-10-18 NOTE — Telephone Encounter (Signed)
Request received from Dr Jarold Song for a wheelchair and adult diapers for the patient. Call placed to [facility] # [phone] to inquire if the patient's insurance will cover adult diapers and also what supplies are needed on the prescription for the wheelchair. Spoke to [employer] who stated that medicare will not cover adult diapers but they can always submit a request to see what happens. She also stated that the order for the wheelchair needs to include: back cushion, seat cushion, anti-tippers, elevated leg rests and seat belt. Provider notes also need to be included with the prescription.   Update provided to Dr Jarold Song. Await prescriptions.

## 2016-10-18 NOTE — Progress Notes (Addendum)
Subjective:  Patient ID: Ashlee Mack, female    DOB: 13-Oct-1940  Age: 76 y.o. MRN: [id number]  CC: Hypertension; Chronic Kidney Disease; Anorexia; and Urinary Incontinence   HPI Ashlee Mack is a 76 year old female with a history of hypertension, hyperlipidemia, urinary and fecal incontinence who presents today for follow-up visit.  She is accompanied by her son and husband and is requesting refills of medications. She also has a sore area in her sacral region due to prolonged sitting but family denies presence of discharge.  She has a poor appetite and drinks ensure most of the time. They are requesting a prescription for a wheelchair As she is unable to ambulate on her own and cannot use a cane, walker, due to an unstable gait and inability to support herself with her hands; she has to be supported by her husband and her son  Past Medical History:  Diagnosis Date  . CKD (chronic kidney disease) stage 3, GFR 30-59 ml/min 07/31/2015  . Coronary artery disease    Patient denies  . Hyperlipidemia   . Hypertension     Past Surgical History:  Procedure Laterality Date  . CORONARY ANGIOPLASTY WITH STENT PLACEMENT      No Known Allergies   Outpatient Medications Prior to Visit  Medication Sig Dispense Refill  . feeding supplement (BOOST / RESOURCE BREEZE) LIQD Take 1 Container by mouth 2 (two) times daily between meals. 60 Container 0  . potassium chloride (K-DUR) 10 MEQ tablet Take 2 tablets (20 mEq total) by mouth daily. 20 tablet 0  . amLODipine (NORVASC) 10 MG tablet Take 1 tablet (10 mg total) by mouth daily. 30 tablet 0  . atorvastatin (LIPITOR) 10 MG tablet Take 1 tablet (10 mg total) by mouth daily. 30 tablet 0   No facility-administered medications prior to visit.     ROS Review of Systems Constitutional: Negative for fever, chills, diaphoresis, activity change, positive for appetite change  HENT: Negative for ear pain, nosebleeds, congestion, facial swelling,  rhinorrhea, neck pain, neck stiffness and ear discharge.  Eyes: Negative for pain, discharge, redness, itching and visual disturbance. Respiratory: Negative for cough, choking, chest tightness, shortness of breath, wheezing and stridor.  Cardiovascular: Negative for chest pain, palpitations and leg swelling. Gastrointestinal: Negative for abdominal distention. Genitourinary: Positive for urinary and fecal incontinence  Musculoskeletal: Negative for back pain, joint swelling, arthralgias and gait problem. Neurological: Negative for dizziness, tremors, seizures, syncope, facial asymmetry, speech difficulty, weakness, light-headedness, numbness and headaches.  Hematological: Negative for adenopathy. Does not bruise/bleed easily. Psychiatric/Behavioral: Negative for hallucinations, behavioral problems, confusion, dysphoric mood, decreased concentration and agitation.   Objective:  BP (!) 145/75 (BP Location: Right Arm, Patient Position: Sitting, Cuff Size: Small)   Pulse 100   Temp 98.7 F (37.1 C) (Oral)   Wt 93 lb 3.2 oz (42.3 kg)   SpO2 100%   BMI 17.05 kg/m   BP/Weight 10/18/2016 07/02/2016 [id number]  Systolic BP Q000111Q Q000111Q -  Diastolic BP 75 80 -  Wt. (Lbs) 93.2 - 93.03  BMI 17.05 - 17.02      Physical Exam Constitutional: Patient appears thin looking HENT: Normocephalic, atraumatic, External right and left ear normal. Oropharynx is clear and moist.  Eyes: Conjunctivae and EOM are normal. PERRLA, no scleral icterus. Neck: Normal ROM. Neck supple. No JVD. No tracheal deviation. No thyromegaly. CVS: RRR, S1/S2 +, no murmurs, no gallops, no carotid bruit.  Pulmonary: Effort and breath sounds normal, no stridor, rhonchi, wheezes, rales.  Abdominal: Soft. BS +,  no distension, tenderness, rebound or guarding.  Musculoskeletal: Normal range of motion. No edema and no tenderness.  Lymphadenopathy: No lymphadenopathy noted, cervical, inguinal or axillary Neuro: Some form of dementia,  Normal reflexes, muscle tone coordination. No cranial nerve deficit. Skin: stage I sacral pressure ulcerErythema of the skin over the medial aspect of both gluteal region.  Psychiatric: flat affect  Assessment & Plan:   1. Full incontinence of feces We'll write prescription for depends and [facility]ed to [facility]  2. Decubitus ulcer of sacral region, stage 1 Apply bacitracin ointment Educated caregiver about frequent turning  3. Hypertension Slightly above goal of less than 140/90 I'll make no regimen changes Continue amlodipine  4. Hypercholesterolemia Continue statin  Meds ordered this encounter  Medications  . atorvastatin (LIPITOR) 10 MG tablet    Sig: Take 1 tablet (10 mg total) by mouth daily.    Dispense:  30 tablet    Refill:  6  . amLODipine (NORVASC) 10 MG tablet    Sig: Take 1 tablet (10 mg total) by mouth daily.    Dispense:  30 tablet    Refill:  6  . bacitracin 500 UNIT/GM ointment    Sig: Apply 1 application topically 2 (two) times daily.    Dispense:  28 g    Refill:  2    Follow-up: Return in about 6 months (around 04/17/2017) for follow up of chronic medical conditions.   Arnoldo Morale MD

## 2016-10-18 NOTE — Progress Notes (Signed)
Husband states patient just sits in the recliner all day. She doesn't eat very often Husband states she has become a full time person to care for and she is not independent except to feed herself Husband states she is taking 2 pills daily but has no idea what they are used to do.

## 2016-10-19 ENCOUNTER — Telehealth: Payer: Self-pay

## 2016-10-19 NOTE — Telephone Encounter (Signed)
Completed request for additional information faxed to [facility].  Fax # [phone]

## 2016-10-19 NOTE — Telephone Encounter (Signed)
Prescription for manual wheelchair faxed to [facility] - fax # [phone]

## 2016-10-19 NOTE — Telephone Encounter (Signed)
Call placed to [facility] to confirm receipt of the order for the wheelchair.  Spoke to [place] who stated that additional information is needed and a request for the information was faxed to Dr Jarold Song for signature/completion.

## 2016-10-20 ENCOUNTER — Telehealth: Payer: Self-pay

## 2016-10-20 NOTE — Telephone Encounter (Signed)
Call placed to [facility] to confirm receipt of the additional documentation requested for the wheelchair, This CM spoke to [place] who stated that the family refused the wheelchair. They did not want to set up auto pay which is required.  Medicare covers 80% of the cost and the patient would be responsible for $244.84 prior to delivery and then $16.20/month.  She does not have a medicare supplement plan.

## 2017-02-19 ENCOUNTER — Encounter (HOSPITAL_COMMUNITY): Payer: Self-pay

## 2017-02-19 ENCOUNTER — Other Ambulatory Visit: Payer: Self-pay

## 2017-02-19 ENCOUNTER — Emergency Department (HOSPITAL_COMMUNITY): Payer: Medicare Other

## 2017-02-19 ENCOUNTER — Emergency Department (HOSPITAL_COMMUNITY)
Admission: EM | Admit: 2017-02-19 | Discharge: 2017-02-19 | Disposition: A | Discharge status: Home / Self Care | Payer: Medicare Other | Attending: Emergency Medicine | Admitting: Emergency Medicine

## 2017-02-19 DIAGNOSIS — I2510 Atherosclerotic heart disease of native coronary artery without angina pectoris: Secondary | ICD-10-CM | POA: Diagnosis not present

## 2017-02-19 DIAGNOSIS — N3001 Acute cystitis with hematuria: Secondary | ICD-10-CM | POA: Diagnosis not present

## 2017-02-19 DIAGNOSIS — I129 Hypertensive chronic kidney disease with stage 1 through stage 4 chronic kidney disease, or unspecified chronic kidney disease: Secondary | ICD-10-CM | POA: Diagnosis not present

## 2017-02-19 DIAGNOSIS — Z79899 Other long term (current) drug therapy: Secondary | ICD-10-CM | POA: Diagnosis not present

## 2017-02-19 DIAGNOSIS — N183 Chronic kidney disease, stage 3 (moderate): Secondary | ICD-10-CM | POA: Insufficient documentation

## 2017-02-19 DIAGNOSIS — N3000 Acute cystitis without hematuria: Secondary | ICD-10-CM

## 2017-02-19 DIAGNOSIS — R531 Weakness: Secondary | ICD-10-CM | POA: Diagnosis not present

## 2017-02-19 DIAGNOSIS — R262 Difficulty in walking, not elsewhere classified: Secondary | ICD-10-CM | POA: Diagnosis not present

## 2017-02-19 DIAGNOSIS — Z955 Presence of coronary angioplasty implant and graft: Secondary | ICD-10-CM | POA: Insufficient documentation

## 2017-02-19 LAB — CBC WITH DIFFERENTIAL/PLATELET
BASOS ABS: 0.6 10*3/uL — AB (ref 0.0–0.1)
Basophils Relative: 5 %
Eosinophils Absolute: 0 10*3/uL (ref 0.0–0.7)
Eosinophils Relative: 0 %
HCT: 28.5 % — ABNORMAL LOW (ref 36.0–46.0)
HEMOGLOBIN: 9.3 g/dL — AB (ref 12.0–15.0)
Lymphocytes Relative: 23 %
Lymphs Abs: 2.8 10*3/uL (ref 0.7–4.0)
MCH: 24.1 pg — ABNORMAL LOW (ref 26.0–34.0)
MCHC: 32.6 g/dL (ref 30.0–36.0)
MCV: 73.8 fL — ABNORMAL LOW (ref 78.0–100.0)
MONOS PCT: 14 %
Monocytes Absolute: 1.7 10*3/uL — ABNORMAL HIGH (ref 0.1–1.0)
NEUTROS PCT: 58 %
Neutro Abs: 7 10*3/uL (ref 1.7–7.7)
Platelets: 804 10*3/uL — ABNORMAL HIGH (ref 150–400)
RBC: 3.86 MIL/uL — AB (ref 3.87–5.11)
RDW: 29.7 % — ABNORMAL HIGH (ref 11.5–15.5)
WBC: 12.1 10*3/uL — ABNORMAL HIGH (ref 4.0–10.5)

## 2017-02-19 LAB — COMPREHENSIVE METABOLIC PANEL
ALBUMIN: 4 g/dL (ref 3.5–5.0)
ALT: 19 U/L (ref 14–54)
ANION GAP: 7 (ref 5–15)
AST: 41 U/L (ref 15–41)
Alkaline Phosphatase: 80 U/L (ref 38–126)
BILIRUBIN TOTAL: 0.7 mg/dL (ref 0.3–1.2)
BUN: 23 mg/dL — ABNORMAL HIGH (ref 6–20)
CO2: 27 mmol/L (ref 22–32)
Calcium: 9.1 mg/dL (ref 8.9–10.3)
Chloride: 104 mmol/L (ref 101–111)
Creatinine, Ser: 1.25 mg/dL — ABNORMAL HIGH (ref 0.44–1.00)
GFR calc Af Amer: 48 mL/min — ABNORMAL LOW (ref >60)
GFR, EST NON AFRICAN AMERICAN: 41 mL/min — AB (ref >60)
GLUCOSE: 92 mg/dL (ref 65–99)
POTASSIUM: 3.8 mmol/L (ref 3.5–5.1)
Sodium: 138 mmol/L (ref 135–145)
TOTAL PROTEIN: 8.5 g/dL — AB (ref 6.5–8.1)

## 2017-02-19 LAB — URINALYSIS, ROUTINE W REFLEX MICROSCOPIC
BILIRUBIN URINE: NEGATIVE
Glucose, UA: NEGATIVE mg/dL
KETONES UR: NEGATIVE mg/dL
Nitrite: POSITIVE — AB
PH: 5 (ref 5.0–8.0)
Protein, ur: 30 mg/dL — AB
Specific Gravity, Urine: 1.013 (ref 1.005–1.030)

## 2017-02-19 LAB — TROPONIN I

## 2017-02-19 MED ORDER — CEPHALEXIN 500 MG PO CAPS: 500.0000 mg | ORAL_CAPSULE | Freq: Four times a day (QID) | ORAL | 0 refills | Status: DC

## 2017-02-19 MED ORDER — CEFTRIAXONE SODIUM 1 G IJ SOLR
1.0000 g | Freq: Once | INTRAMUSCULAR | Status: AC
  Administered 2017-02-19: 1 g via INTRAVENOUS
  Filled 2017-02-19: qty 10

## 2017-02-19 NOTE — ED Notes (Signed)
In and Out cath completed with this RN and Ubaldo Glassing, [place].  1 attempt made by RN, 1 successful attempt made by Ubaldo Glassing, NT.  Sterile technique followed.

## 2017-02-19 NOTE — Discharge Instructions (Signed)
Take the antibiotic Keflex as directed for the next 7 days. Would expect improvement in 2 days. Return for any new or worse symptoms. Make an appointment to follow-up with your doctor.

## 2017-02-19 NOTE — ED Provider Notes (Signed)
[name] DEPT Provider Note   CSN: [id number] Arrival date & time: 02/19/17  1343     History   Chief Complaint Chief Complaint  Patient presents with  . Weakness    HPI Ashlee Mack is a 76 y.o. female.  Patient presents with 2 day history of feeling weak no significant complaints no chest pain no shortness of breath no abdominal pain nausea or vomiting no dysuria. No fevers. No upper respiratory infection symptoms.      Past Medical History:  Diagnosis Date  . CKD (chronic kidney disease) stage 3, GFR 30-59 ml/min 07/31/2015  . Coronary artery disease    Patient denies  . Hyperlipidemia   . Hypertension     Patient Active Problem List   Diagnosis Date Noted  . Incontinence of bowel 10/18/2016  . Hyperlipidemia 10/18/2016  . Dementia 10/18/2016  . Pressure injury of skin 07/01/2016  . Dehydration   . Weakness generalized 06/30/2016  . Weakness 06/30/2016  . Acute cystitis without hematuria   . Anemia of chronic disease 01/03/2016  . Pre-diabetes 08/13/2015  . Bradycardia 08/03/2015  . Orthostasis 08/03/2015  . Syncope 08/02/2015  . Chronic kidney disease 08/02/2015  . Fecal impaction ([place]) 08/01/2015  . Lower urinary tract infection   . Generalized weakness 07/31/2015  . CKD (chronic kidney disease) stage 3, GFR 30-59 ml/min 07/31/2015  . Chronic anemia 07/31/2015  . Pressure ulcer 02/12/2015  . Acute pyelonephritis 02/10/2015  . Constipation 02/10/2015  . Lactic acidosis 02/10/2015  . Acute encephalopathy 02/10/2015  . Malnutrition of moderate degree ([place]) 02/10/2015  . Severe sepsis ([place])   . Sepsis secondary to UTI ([place])   . Poor appetite 12/10/2014  . Acute renal failure syndrome ([place])   . Acute on chronic renal failure ([place]) 08/27/2014  . Essential hypertension 08/27/2014  . Lower urinary tract infectious disease 08/26/2014  . SIRS (systemic inflammatory response syndrome) ([place]) 08/26/2014  . Acute renal failure ([place]) 08/26/2014  .  Nausea and vomiting 08/26/2014  . Diarrhea 08/26/2014  . Coronary artery disease   . Malignant hypertension 03/23/2014  . Uncontrolled hypertension 03/23/2014  . 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 Hypokalemia 03/23/2014  . Anemia, iron deficiency 03/23/2014  . Protein-calorie malnutrition, severe ([place]) 03/23/2014  . Hypertension 03/22/2014    Past Surgical History:  Procedure Laterality Date  . CORONARY ANGIOPLASTY WITH STENT PLACEMENT      OB History    No data available       Home Medications    Prior to Admission medications   Medication Sig Start Date End Date Taking? Authorizing Provider  amLODipine (NORVASC) 10 MG tablet Take 1 tablet (10 mg total) by mouth daily. 10/18/16  Yes Arnoldo Morale, MD  atorvastatin (LIPITOR) 10 MG tablet Take 1 tablet (10 mg total) by mouth daily. 10/18/16  Yes Arnoldo Morale, MD  feeding supplement (BOOST / RESOURCE BREEZE) LIQD Take 1 Container by mouth 2 (two) times daily between meals. 01/05/16  Yes Debbe Odea, MD  cephALEXin (KEFLEX) 500 MG capsule Take 1 capsule (500 mg total) by mouth 4 (four) times daily. 02/19/17   Fredia Sorrow, MD    Family History Family History  Problem Relation Age of Onset  . Hypertension Mother   . Hypertension Father     Social History Social History  Substance Use Topics  . Smoking status: Never Smoker  . Smokeless tobacco: Never Used  . Alcohol use No     Allergies   Patient has  no known allergies.   Review of Systems Review of Systems  Constitutional: Positive for fatigue. Negative for fever.  HENT: Negative for congestion.   Eyes: Negative for redness.  Respiratory: Negative for shortness of breath.   Cardiovascular: Negative for chest pain.  Gastrointestinal: Negative for abdominal pain, nausea and vomiting.  Genitourinary: Negative for dysuria.  Musculoskeletal: Negative for back pain.  Skin: Negative for rash.  Neurological: Positive for  weakness.  Hematological: Does not bruise/bleed easily.  Psychiatric/Behavioral: Negative for confusion.     Physical Exam Updated Vital Signs BP 108/72   Pulse 80   Temp 98.4 F (36.9 C) (Oral)   Resp 19   Ht 1.778 m (5\' 10" )   Wt 49.9 kg (110 lb)   SpO2 100%   BMI 15.78 kg/m   Physical Exam  Constitutional: She is oriented to person, place, and time. She appears well-developed and well-nourished. No distress.  HENT:  Head: Normocephalic and atraumatic.  Mouth/Throat: Oropharynx is clear and moist.  Eyes: EOM are normal. Pupils are equal, round, and reactive to light.  Neck: Normal range of motion. Neck supple.  Cardiovascular: Normal rate, regular rhythm and normal heart sounds.   Pulmonary/Chest: Effort normal and breath sounds normal. No respiratory distress.  Abdominal: Soft. Bowel sounds are normal. There is no tenderness.  Musculoskeletal: Normal range of motion. She exhibits edema.  Neurological: She is alert and oriented to person, place, and time. No cranial nerve deficit. She exhibits normal muscle tone. Coordination normal.  Skin: Skin is warm.  Nursing note and vitals reviewed.    ED Treatments / Results  Labs (all labs ordered are listed, but only abnormal results are displayed) Labs Reviewed  URINALYSIS, ROUTINE W REFLEX MICROSCOPIC - Abnormal; Notable for the following:       Result Value   APPearance CLOUDY (*)    Hgb urine dipstick SMALL (*)    Protein, ur 30 (*)    Nitrite POSITIVE (*)    Leukocytes, UA LARGE (*)    Bacteria, UA MANY (*)    Squamous Epithelial / LPF 0-5 (*)    All other components within normal limits  COMPREHENSIVE METABOLIC PANEL - Abnormal; Notable for the following:    BUN 23 (*)    Creatinine, Ser 1.25 (*)    Total Protein 8.5 (*)    GFR calc non Af Amer 41 (*)    GFR calc Af Amer 48 (*)    All other components within normal limits  CBC WITH DIFFERENTIAL/PLATELET - Abnormal; Notable for the following:    WBC 12.1 (*)      RBC 3.86 (*)    Hemoglobin 9.3 (*)    HCT 28.5 (*)    MCV 73.8 (*)    MCH 24.1 (*)    RDW 29.7 (*)    Platelets 804 (*)    Monocytes Absolute 1.7 (*)    Basophils Absolute 0.6 (*)    All other components within normal limits  URINE CULTURE  TROPONIN I    EKG  EKG Interpretation  Date/Time:  Saturday February 19 2017 14:08:49 EDT Ventricular Rate:  79 PR Interval:    QRS Duration: 104 QT Interval:  420 QTC Calculation: 482 R Axis:   24 Text Interpretation:  Sinus rhythm Atrial premature complex Anterior infarct, old No significant change since last tracing Confirmed by Fredia Sorrow [phone]) on 02/19/2017 4:54:57 PM       Radiology Dg Chest 2 View  Result Date: 02/19/2017 CLINICAL DATA:  Difficulty  walking over the past 2 days. EXAM: CHEST  2 VIEW COMPARISON:  PA and lateral chest 01/01/2016. FINDINGS: The lungs are clear. Heart size is upper normal. No pneumothorax or pleural effusion. Asymmetric elevation of the right hemidiaphragm is unchanged. Scoliosis is again seen. IMPRESSION: No acute disease. Electronically Signed   By: Inge Rise M.D.   On: 02/19/2017 15:18   Ct Head Wo Contrast  Result Date: 02/19/2017 CLINICAL DATA:  Weakness.  Difficulty ambulating. EXAM: CT HEAD WITHOUT CONTRAST TECHNIQUE: Contiguous axial images were obtained from the base of the skull through the vertex without intravenous contrast. COMPARISON:  MRI brain 08/04/2015. FINDINGS: Brain: A remote left occipital infarct is noted. Moderate generalized atrophy and white matter disease are noted. 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 No acute infarct, hemorrhage, or mass lesion is present. The ventricles are proportionate to the degree of atrophy. The brainstem and cerebellum are unremarkable. Vascular: Atherosclerotic calcifications are present within cavernous internal carotid artery is bilaterally without focal stenosis. Skull: The calvarium is intact. No focal lytic  or blastic lesions are present. No significant extracranial soft tissue injury is evident. Sinuses/Orbits: The paranasal sinuses and mastoid air cells are clear. The globes and orbits are within normal limits. IMPRESSION: 1. No acute intracranial abnormality. 2. Atrophy and white matter disease is moderately advanced for age. This is stable. 3. 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 Electronically Signed   By: San Morelle M.D.   On: 02/19/2017 15:19    Procedures Procedures (including critical care time)  Medications Ordered in ED Medications  cefTRIAXone (ROCEPHIN) 1 g in dextrose 5 % 50 mL IVPB (not administered)     Initial Impression / Assessment and Plan / ED Course  I have reviewed the triage vital signs and the nursing notes.  Pertinent labs & imaging results that were available during my care of the patient were reviewed by me and considered in my medical decision making (see chart for details).    Patient came in for feeling weak overall for the past 2 days. No pain complaint. Workup here was done broadly with with finding of urinary tract infection. Patient also has a mild leukocytosis. Chest x-ray was negative for pneumonia head CT showed no acute changes. Troponin was normal so no evidence of any silent MI. Patient has no allergies. She'll be treated with Rocephin 1 g IV here and then sent home on Keflex. She was with her spouse. They will return if she does not improve in 1-2 days. She will also follow-up with her regular doctor to verify that the infection clears.  Patient here alert no significant toxicity.   Final Clinical Impressions(s) / ED Diagnoses   Final diagnoses:  Acute cystitis without hematuria    New Prescriptions New Prescriptions   CEPHALEXIN (KEFLEX) 500 MG CAPSULE    Take 1 capsule (500 mg total) by mouth 4 (four) times daily.     Fredia Sorrow, MD 02/19/17 1721

## 2017-02-19 NOTE — ED Triage Notes (Signed)
She has had more difficulty than usual in getting up and ambulating for past couple of days. She has no c/o illness or pain.

## 2017-02-19 NOTE — ED Notes (Signed)
Patient transported to CT 

## 2017-02-22 LAB — URINE CULTURE: Culture: >=100000 — AB

## 2017-02-23 ENCOUNTER — Telehealth: Payer: Self-pay | Admitting: Emergency Medicine

## 2017-02-23 NOTE — Telephone Encounter (Signed)
Post ED Visit - Positive Culture Follow-up  Culture report reviewed by antimicrobial stewardship pharmacist:  [x]  Elenor Quinones, Pharm.D. []  Heide Guile, Pharm.D., BCPS AQ-ID []  Parks Neptune, Pharm.D., BCPS []  Alycia Rossetti, Pharm.D., BCPS []  [place], [place].D., BCPS, AAHIVP []  Legrand Como, Pharm.D., BCPS, AAHIVP []  Salome Arnt, PharmD, BCPS []  Dimitri Ped, PharmD, BCPS []  Vincenza Hews, PharmD, BCPS  Positive urine culture Treated with cephalexin, organism sensitive to the same and no further patient follow-up is required at this time.  [name] 02/23/2017, 1:45 PM

## 2017-02-26 ENCOUNTER — Emergency Department (HOSPITAL_COMMUNITY): Payer: Medicare Other

## 2017-02-26 ENCOUNTER — Encounter (HOSPITAL_COMMUNITY): Payer: Self-pay | Admitting: Emergency Medicine

## 2017-02-26 ENCOUNTER — Inpatient Hospital Stay (HOSPITAL_COMMUNITY)
Admission: EM | Admit: 2017-02-26 | Discharge: 2017-03-02 | DRG: 388 | Disposition: A | Discharge status: Home / Self Care | Payer: Medicare Other | Attending: Nephrology | Admitting: Nephrology

## 2017-02-26 DIAGNOSIS — R531 Weakness: Secondary | ICD-10-CM | POA: Diagnosis not present

## 2017-02-26 DIAGNOSIS — N390 Urinary tract infection, site not specified: Secondary | ICD-10-CM | POA: Diagnosis not present

## 2017-02-26 DIAGNOSIS — Z681 Body mass index (BMI) 19 or less, adult: Secondary | ICD-10-CM

## 2017-02-26 DIAGNOSIS — N183 Chronic kidney disease, stage 3 (moderate): Secondary | ICD-10-CM | POA: Diagnosis not present

## 2017-02-26 DIAGNOSIS — K6289 Other specified diseases of anus and rectum: Secondary | ICD-10-CM | POA: Diagnosis not present

## 2017-02-26 DIAGNOSIS — R7989 Other specified abnormal findings of blood chemistry: Secondary | ICD-10-CM | POA: Diagnosis not present

## 2017-02-26 DIAGNOSIS — F0390 Unspecified dementia without behavioral disturbance: Secondary | ICD-10-CM | POA: Diagnosis present

## 2017-02-26 DIAGNOSIS — I10 Essential (primary) hypertension: Secondary | ICD-10-CM | POA: Diagnosis present

## 2017-02-26 DIAGNOSIS — I2510 Atherosclerotic heart disease of native coronary artery without angina pectoris: Secondary | ICD-10-CM | POA: Diagnosis present

## 2017-02-26 DIAGNOSIS — D72828 Other elevated white blood cell count: Secondary | ICD-10-CM

## 2017-02-26 DIAGNOSIS — Z66 Do not resuscitate: Secondary | ICD-10-CM | POA: Diagnosis present

## 2017-02-26 DIAGNOSIS — E785 Hyperlipidemia, unspecified: Secondary | ICD-10-CM | POA: Diagnosis present

## 2017-02-26 DIAGNOSIS — N3000 Acute cystitis without hematuria: Secondary | ICD-10-CM | POA: Diagnosis present

## 2017-02-26 DIAGNOSIS — I129 Hypertensive chronic kidney disease with stage 1 through stage 4 chronic kidney disease, or unspecified chronic kidney disease: Secondary | ICD-10-CM | POA: Diagnosis present

## 2017-02-26 DIAGNOSIS — R296 Repeated falls: Secondary | ICD-10-CM | POA: Diagnosis present

## 2017-02-26 DIAGNOSIS — Z1611 Resistance to penicillins: Secondary | ICD-10-CM | POA: Diagnosis present

## 2017-02-26 DIAGNOSIS — B9620 Unspecified Escherichia coli [E. coli] as the cause of diseases classified elsewhere: Secondary | ICD-10-CM | POA: Diagnosis present

## 2017-02-26 DIAGNOSIS — E43 Unspecified severe protein-calorie malnutrition: Secondary | ICD-10-CM | POA: Diagnosis not present

## 2017-02-26 DIAGNOSIS — K5641 Fecal impaction: Secondary | ICD-10-CM | POA: Diagnosis not present

## 2017-02-26 DIAGNOSIS — Z79899 Other long term (current) drug therapy: Secondary | ICD-10-CM

## 2017-02-26 DIAGNOSIS — Z955 Presence of coronary angioplasty implant and graft: Secondary | ICD-10-CM

## 2017-02-26 LAB — COMPREHENSIVE METABOLIC PANEL
ALBUMIN: 4.5 g/dL (ref 3.5–5.0)
ALT: 27 U/L (ref 14–54)
ANION GAP: 16 — AB (ref 5–15)
AST: 58 U/L — AB (ref 15–41)
Alkaline Phosphatase: 93 U/L (ref 38–126)
BILIRUBIN TOTAL: 0.5 mg/dL (ref 0.3–1.2)
BUN: 24 mg/dL — AB (ref 6–20)
CHLORIDE: 102 mmol/L (ref 101–111)
CO2: 21 mmol/L — AB (ref 22–32)
Calcium: 9.9 mg/dL (ref 8.9–10.3)
Creatinine, Ser: 1.29 mg/dL — ABNORMAL HIGH (ref 0.44–1.00)
GFR calc Af Amer: 46 mL/min — ABNORMAL LOW (ref >60)
GFR calc non Af Amer: 40 mL/min — ABNORMAL LOW (ref >60)
GLUCOSE: 175 mg/dL — AB (ref 65–99)
POTASSIUM: 3.8 mmol/L (ref 3.5–5.1)
SODIUM: 139 mmol/L (ref 135–145)
TOTAL PROTEIN: 9.6 g/dL — AB (ref 6.5–8.1)

## 2017-02-26 LAB — CBC
HEMATOCRIT: 31 % — AB (ref 36.0–46.0)
HEMOGLOBIN: 10.2 g/dL — AB (ref 12.0–15.0)
MCH: 24.2 pg — ABNORMAL LOW (ref 26.0–34.0)
MCHC: 32.9 g/dL (ref 30.0–36.0)
MCV: 73.5 fL — AB (ref 78.0–100.0)
Platelets: 1074 10*3/uL (ref 150–400)
RBC: 4.22 MIL/uL (ref 3.87–5.11)
RDW: 30 % — AB (ref 11.5–15.5)
WBC: 29 10*3/uL — ABNORMAL HIGH (ref 4.0–10.5)

## 2017-02-26 LAB — LIPASE, BLOOD: Lipase: 38 U/L (ref 11–51)

## 2017-02-26 LAB — I-STAT TROPONIN, ED: TROPONIN I, POC: 0.02 ng/mL (ref 0.00–0.08)

## 2017-02-26 LAB — URINALYSIS, ROUTINE W REFLEX MICROSCOPIC
BILIRUBIN URINE: NEGATIVE
GLUCOSE, UA: NEGATIVE mg/dL
Hgb urine dipstick: NEGATIVE
KETONES UR: NEGATIVE mg/dL
Nitrite: NEGATIVE
PH: 7 (ref 5.0–8.0)
SPECIFIC GRAVITY, URINE: 1.01 (ref 1.005–1.030)

## 2017-02-26 LAB — URINALYSIS, MICROSCOPIC (REFLEX)

## 2017-02-26 LAB — CBG MONITORING, ED
Glucose-Capillary: 169 mg/dL — ABNORMAL HIGH (ref 65–99)
Glucose-Capillary: 168 mg/dL — ABNORMAL HIGH (ref 65–99)

## 2017-02-26 MED ORDER — SODIUM CHLORIDE 0.9 % IV BOLUS (SEPSIS)
1000.0000 mL | Freq: Once | INTRAVENOUS | Status: AC
  Administered 2017-02-26: 1000 mL via INTRAVENOUS

## 2017-02-26 MED ORDER — IOPAMIDOL (ISOVUE-300) INJECTION 61%
INTRAVENOUS | Status: AC
  Filled 2017-02-26: qty 100

## 2017-02-26 MED ORDER — IOPAMIDOL (ISOVUE-300) INJECTION 61%
100.0000 mL | Freq: Once | INTRAVENOUS | Status: AC | PRN
  Administered 2017-02-27: 100 mL via INTRAVENOUS

## 2017-02-26 NOTE — ED Triage Notes (Signed)
Pt seen and diagnosed with UTI a week ago; multiple falls, weakness, and poor appetite since. No confusion.

## 2017-02-26 NOTE — ED Notes (Signed)
Son verbalizes she looks worse and shaking; CBG and VS obtained.

## 2017-02-27 DIAGNOSIS — Z79899 Other long term (current) drug therapy: Secondary | ICD-10-CM | POA: Diagnosis not present

## 2017-02-27 DIAGNOSIS — Z1611 Resistance to penicillins: Secondary | ICD-10-CM | POA: Diagnosis present

## 2017-02-27 DIAGNOSIS — N183 Chronic kidney disease, stage 3 (moderate): Secondary | ICD-10-CM | POA: Diagnosis present

## 2017-02-27 DIAGNOSIS — K6289 Other specified diseases of anus and rectum: Secondary | ICD-10-CM | POA: Diagnosis present

## 2017-02-27 DIAGNOSIS — E785 Hyperlipidemia, unspecified: Secondary | ICD-10-CM | POA: Diagnosis present

## 2017-02-27 DIAGNOSIS — K51218 Ulcerative (chronic) proctitis with other complication: Secondary | ICD-10-CM | POA: Diagnosis not present

## 2017-02-27 DIAGNOSIS — R41841 Cognitive communication deficit: Secondary | ICD-10-CM | POA: Diagnosis not present

## 2017-02-27 DIAGNOSIS — R7989 Other specified abnormal findings of blood chemistry: Secondary | ICD-10-CM | POA: Diagnosis present

## 2017-02-27 DIAGNOSIS — K5641 Fecal impaction: Secondary | ICD-10-CM | POA: Diagnosis not present

## 2017-02-27 DIAGNOSIS — Z66 Do not resuscitate: Secondary | ICD-10-CM | POA: Diagnosis present

## 2017-02-27 DIAGNOSIS — I10 Essential (primary) hypertension: Secondary | ICD-10-CM | POA: Diagnosis not present

## 2017-02-27 DIAGNOSIS — N390 Urinary tract infection, site not specified: Secondary | ICD-10-CM | POA: Diagnosis not present

## 2017-02-27 DIAGNOSIS — E43 Unspecified severe protein-calorie malnutrition: Secondary | ICD-10-CM | POA: Diagnosis not present

## 2017-02-27 DIAGNOSIS — R5381 Other malaise: Secondary | ICD-10-CM | POA: Diagnosis not present

## 2017-02-27 DIAGNOSIS — N3000 Acute cystitis without hematuria: Secondary | ICD-10-CM | POA: Diagnosis present

## 2017-02-27 DIAGNOSIS — R293 Abnormal posture: Secondary | ICD-10-CM | POA: Diagnosis not present

## 2017-02-27 DIAGNOSIS — N189 Chronic kidney disease, unspecified: Secondary | ICD-10-CM | POA: Diagnosis not present

## 2017-02-27 DIAGNOSIS — Z681 Body mass index (BMI) 19 or less, adult: Secondary | ICD-10-CM | POA: Diagnosis not present

## 2017-02-27 DIAGNOSIS — I129 Hypertensive chronic kidney disease with stage 1 through stage 4 chronic kidney disease, or unspecified chronic kidney disease: Secondary | ICD-10-CM | POA: Diagnosis present

## 2017-02-27 DIAGNOSIS — E440 Moderate protein-calorie malnutrition: Secondary | ICD-10-CM | POA: Diagnosis not present

## 2017-02-27 DIAGNOSIS — R1084 Generalized abdominal pain: Secondary | ICD-10-CM | POA: Diagnosis not present

## 2017-02-27 DIAGNOSIS — Z955 Presence of coronary angioplasty implant and graft: Secondary | ICD-10-CM | POA: Diagnosis not present

## 2017-02-27 DIAGNOSIS — Z9181 History of falling: Secondary | ICD-10-CM | POA: Diagnosis not present

## 2017-02-27 DIAGNOSIS — F0390 Unspecified dementia without behavioral disturbance: Secondary | ICD-10-CM | POA: Diagnosis present

## 2017-02-27 DIAGNOSIS — R1319 Other dysphagia: Secondary | ICD-10-CM | POA: Diagnosis not present

## 2017-02-27 DIAGNOSIS — M6281 Muscle weakness (generalized): Secondary | ICD-10-CM | POA: Diagnosis not present

## 2017-02-27 DIAGNOSIS — R278 Other lack of coordination: Secondary | ICD-10-CM | POA: Diagnosis not present

## 2017-02-27 DIAGNOSIS — I2510 Atherosclerotic heart disease of native coronary artery without angina pectoris: Secondary | ICD-10-CM | POA: Diagnosis present

## 2017-02-27 DIAGNOSIS — D72825 Bandemia: Secondary | ICD-10-CM | POA: Diagnosis not present

## 2017-02-27 DIAGNOSIS — Z87440 Personal history of urinary (tract) infections: Secondary | ICD-10-CM | POA: Diagnosis not present

## 2017-02-27 DIAGNOSIS — R296 Repeated falls: Secondary | ICD-10-CM | POA: Diagnosis present

## 2017-02-27 DIAGNOSIS — B9620 Unspecified Escherichia coli [E. coli] as the cause of diseases classified elsewhere: Secondary | ICD-10-CM | POA: Diagnosis present

## 2017-02-27 LAB — CBC
HCT: 30.1 % — ABNORMAL LOW (ref 36.0–46.0)
Hemoglobin: 9.7 g/dL — ABNORMAL LOW (ref 12.0–15.0)
MCH: 23.6 pg — ABNORMAL LOW (ref 26.0–34.0)
MCHC: 32.2 g/dL (ref 30.0–36.0)
MCV: 73.2 fL — ABNORMAL LOW (ref 78.0–100.0)
PLATELETS: 955 10*3/uL — AB (ref 150–400)
RBC: 4.11 MIL/uL (ref 3.87–5.11)
RDW: 30 % — AB (ref 11.5–15.5)
WBC: 31.5 10*3/uL — ABNORMAL HIGH (ref 4.0–10.5)

## 2017-02-27 LAB — BASIC METABOLIC PANEL
Anion gap: 13 (ref 5–15)
BUN: 22 mg/dL — AB (ref 6–20)
CALCIUM: 9.5 mg/dL (ref 8.9–10.3)
CO2: 25 mmol/L (ref 22–32)
CREATININE: 1.11 mg/dL — AB (ref 0.44–1.00)
Chloride: 102 mmol/L (ref 101–111)
GFR calc Af Amer: 55 mL/min — ABNORMAL LOW (ref >60)
GFR calc non Af Amer: 48 mL/min — ABNORMAL LOW (ref >60)
Glucose, Bld: 154 mg/dL — ABNORMAL HIGH (ref 65–99)
Potassium: 3.7 mmol/L (ref 3.5–5.1)
SODIUM: 140 mmol/L (ref 135–145)

## 2017-02-27 MED ORDER — POLYETHYLENE GLYCOL 3350 17 G PO PACK
17.0000 g | PACK | Freq: Every day | ORAL | Status: DC
  Administered 2017-02-27 – 2017-03-02 (×4): 17 g via ORAL
  Filled 2017-02-27 (×4): qty 1

## 2017-02-27 MED ORDER — ATORVASTATIN CALCIUM 10 MG PO TABS
10.0000 mg | ORAL_TABLET | Freq: Every day | ORAL | Status: DC
  Administered 2017-02-27 – 2017-03-01 (×3): 10 mg via ORAL
  Filled 2017-02-27 (×3): qty 1

## 2017-02-27 MED ORDER — BOOST / RESOURCE BREEZE PO LIQD
1.0000 | Freq: Two times a day (BID) | ORAL | Status: DC
  Administered 2017-02-27 – 2017-03-01 (×4): 1 via ORAL

## 2017-02-27 MED ORDER — SENNA 8.6 MG PO TABS
1.0000 | ORAL_TABLET | Freq: Two times a day (BID) | ORAL | Status: DC
  Administered 2017-02-27 – 2017-03-02 (×7): 8.6 mg via ORAL
  Filled 2017-02-27 (×8): qty 1

## 2017-02-27 MED ORDER — PIPERACILLIN-TAZOBACTAM 3.375 G IVPB
3.3750 g | Freq: Once | INTRAVENOUS | Status: AC
  Administered 2017-02-27: 3.375 g via INTRAVENOUS
  Filled 2017-02-27: qty 50

## 2017-02-27 MED ORDER — ACETAMINOPHEN 325 MG PO TABS: 650.0000 mg | ORAL_TABLET | Freq: Four times a day (QID) | ORAL | Status: DC | PRN

## 2017-02-27 MED ORDER — ENOXAPARIN SODIUM 40 MG/0.4ML ~~LOC~~ SOLN
40.0000 mg | SUBCUTANEOUS | Status: DC
  Administered 2017-02-27 – 2017-03-02 (×4): 40 mg via SUBCUTANEOUS
  Filled 2017-02-27 (×4): qty 0.4

## 2017-02-27 MED ORDER — ONDANSETRON HCL 4 MG/2ML IJ SOLN: 4.0000 mg | Freq: Four times a day (QID) | INTRAMUSCULAR | Status: DC | PRN

## 2017-02-27 MED ORDER — ONDANSETRON HCL 4 MG PO TABS: 4.0000 mg | ORAL_TABLET | Freq: Four times a day (QID) | ORAL | Status: DC | PRN

## 2017-02-27 MED ORDER — PIPERACILLIN-TAZOBACTAM 3.375 G IVPB
3.3750 g | Freq: Three times a day (TID) | INTRAVENOUS | Status: DC
  Administered 2017-02-27 – 2017-03-02 (×9): 3.375 g via INTRAVENOUS
  Filled 2017-02-27 (×8): qty 50

## 2017-02-27 MED ORDER — AMPICILLIN-SULBACTAM SODIUM 3 (2-1) G IJ SOLR
3.0000 g | Freq: Four times a day (QID) | INTRAMUSCULAR | Status: DC
  Administered 2017-02-27: 3 g via INTRAVENOUS
  Filled 2017-02-27 (×2): qty 3

## 2017-02-27 MED ORDER — DOCUSATE SODIUM 100 MG PO CAPS
100.0000 mg | ORAL_CAPSULE | Freq: Two times a day (BID) | ORAL | Status: DC
  Administered 2017-02-27 – 2017-03-02 (×8): 100 mg via ORAL
  Filled 2017-02-27 (×8): qty 1

## 2017-02-27 MED ORDER — ACETAMINOPHEN 650 MG RE SUPP: 650.0000 mg | Freq: Four times a day (QID) | RECTAL | Status: DC | PRN

## 2017-02-27 MED ORDER — FLEET ENEMA 7-19 GM/118ML RE ENEM: 1.0000 | ENEMA | Freq: Once | RECTAL | Status: DC

## 2017-02-27 MED ORDER — SODIUM CHLORIDE 0.9 % IV SOLN
INTRAVENOUS | Status: DC
  Administered 2017-02-27 – 2017-02-28 (×3): via INTRAVENOUS

## 2017-02-27 MED ORDER — AMLODIPINE BESYLATE 5 MG PO TABS
10.0000 mg | ORAL_TABLET | Freq: Every day | ORAL | Status: DC
Start: 2017-02-27 — End: 2017-03-02
  Administered 2017-02-27 – 2017-03-02 (×4): 10 mg via ORAL
  Filled 2017-02-27 (×4): qty 2

## 2017-02-27 NOTE — H&P (Signed)
History and Physical    Ashlee Mack DGU:[id number] DOB: May 06, 1941 DOA: 02/26/2017  PCP: Patient, No Pcp Per  Patient coming from: Home  I have personally briefly reviewed patient's old medical records in [place]  Chief Complaint: Generalized weakness  HPI: Ashlee Mack is a 76 y.o. female with medical history significant of UTI diagnosis last week.  Put on Keflex.  Since then generalized weakness, multiple falls, poor appetite.  No confusion.  Abdominal tenderness but no pain at rest.  Has tried nothing else for symptoms, nothing makes symptoms better or worse.   ED Course: WBC 29k, platelets 1k (baseline 800).  CT abd/pelvis shows constipation, fecal impaction, and possible proctitis.  Patient given dose of zosyn in ED.   Review of Systems: As per HPI otherwise 10 point review of systems negative.   Past Medical History:  Diagnosis Date  . CKD (chronic kidney disease) stage 3, GFR 30-59 ml/min 07/31/2015  . Coronary artery disease    Patient denies  . Hyperlipidemia   . Hypertension     Past Surgical History:  Procedure Laterality Date  . CORONARY ANGIOPLASTY WITH STENT PLACEMENT       reports that she has never smoked. She has never used smokeless tobacco. She reports that she does not drink alcohol or use drugs.  No Known Allergies  Family History  Problem Relation Age of Onset  . Hypertension Mother   . Hypertension Father      Prior to Admission medications   Medication Sig Start Date End Date Taking? Authorizing Provider  amLODipine (NORVASC) 10 MG tablet Take 1 tablet (10 mg total) by mouth daily. 10/18/16  Yes Arnoldo Morale, MD  atorvastatin (LIPITOR) 10 MG tablet Take 1 tablet (10 mg total) by mouth daily. 10/18/16  Yes Arnoldo Morale, MD  feeding supplement (BOOST / RESOURCE BREEZE) LIQD Take 1 Container by mouth 2 (two) times daily between meals. 01/05/16  Yes Debbe Odea, MD    Physical Exam: Vitals:   02/27/17 0056 02/27/17 0100 02/27/17 0300  02/27/17 0313  BP: 136/79 129/71 (!) 156/84 (!) 156/84  Pulse: (!) 106 (!) 104  (!) 106  Resp: (!) 28 20 (!) 27 (!) 22  Temp:    99 F (37.2 C)  TempSrc:    Oral  SpO2: 97% 97%  93%  Weight:      Height:        Constitutional: Chills, generalized weakness, frail and elderly appearing Eyes: PERRL, lids and conjunctivae normal ENMT: Mucous membranes are moist. Posterior pharynx clear of any exudate or lesions.Normal dentition.  Neck: normal, supple, no masses, no thyromegaly Respiratory: clear to auscultation bilaterally, no wheezing, no crackles. Normal respiratory effort. No accessory muscle use.  Cardiovascular: Regular rate and rhythm, no murmurs / rubs / gallops. No extremity edema. 2+ pedal pulses. No carotid bruits.  Abdomen: no tenderness, no masses palpated. No hepatosplenomegaly. Bowel sounds positive.  Musculoskeletal: no clubbing / cyanosis. No joint deformity upper and lower extremities. Good ROM, no contractures. Normal muscle tone.  Skin: no rashes, lesions, ulcers. No induration Neurologic: CN 2-12 grossly intact. Sensation intact, DTR normal. Strength 5/5 in all 4.  Psychiatric: Normal judgment and insight. Alert and oriented x 3. Normal mood.    Labs on Admission: I have personally reviewed following labs and imaging studies  CBC:  Recent Labs Lab 02/26/17 1807  WBC 29.0*  HGB 10.2*  HCT 31.0*  MCV 73.5*  PLT 9,563*   Basic Metabolic Panel:  Recent Labs Lab  02/26/17 1807  NA 139  K 3.8  CL 102  CO2 21*  GLUCOSE 175*  BUN 24*  CREATININE 1.29*  CALCIUM 9.9   GFR: Estimated Creatinine Clearance: 30.1 mL/min (A) (by C-G formula based on SCr of 1.29 mg/dL (H)). Liver Function Tests:  Recent Labs Lab 02/26/17 1807  AST 58*  ALT 27  ALKPHOS 93  BILITOT 0.5  PROT 9.6*  ALBUMIN 4.5    Recent Labs Lab 02/26/17 1807  LIPASE 38   No results for input(s): AMMONIA in the last 168 hours. Coagulation Profile: No results for input(s): INR,  PROTIME in the last 168 hours. Cardiac Enzymes: No results for input(s): CKTOTAL, CKMB, CKMBINDEX, TROPONINI in the last 168 hours. BNP (last 3 results) No results for input(s): PROBNP in the last 8760 hours. HbA1C: No results for input(s): HGBA1C in the last 72 hours. CBG:  Recent Labs Lab 02/26/17 1410 02/26/17 1712  GLUCAP 169* 168*   Lipid Profile: No results for input(s): CHOL, HDL, LDLCALC, TRIG, CHOLHDL, LDLDIRECT in the last 72 hours. Thyroid Function Tests: No results for input(s): TSH, T4TOTAL, FREET4, T3FREE, THYROIDAB in the last 72 hours. Anemia Panel: No results for input(s): VITAMINB12, FOLATE, FERRITIN, TIBC, IRON, RETICCTPCT in the last 72 hours. Urine analysis:    Component Value Date/Time   COLORURINE YELLOW 02/26/2017 2056   APPEARANCEUR CLEAR 02/26/2017 2056   LABSPEC 1.010 02/26/2017 2056   PHURINE 7.0 02/26/2017 2056   GLUCOSEU NEGATIVE 02/26/2017 2056   HGBUR NEGATIVE 02/26/2017 2056   BILIRUBINUR NEGATIVE 02/26/2017 2056   BILIRUBINUR neg 09/11/2014 1546   KETONESUR NEGATIVE 02/26/2017 2056   PROTEINUR TRACE (A) 02/26/2017 2056   UROBILINOGEN 1.0 02/09/2015 2051   NITRITE NEGATIVE 02/26/2017 2056   LEUKOCYTESUR SMALL (A) 02/26/2017 2056    Radiological Exams on Admission: Ct Abdomen Pelvis W Contrast  Result Date: 02/27/2017 CLINICAL DATA:  Status post multiple falls. Recently diagnosed with urinary tract infection. Generalized weakness and poor appetite. Initial encounter. EXAM: CT ABDOMEN AND PELVIS WITH CONTRAST TECHNIQUE: Multidetector CT imaging of the abdomen and pelvis was performed using the standard protocol following bolus administration of intravenous contrast. CONTRAST:  184mL ISOVUE-300 IOPAMIDOL (ISOVUE-300) INJECTION 61% COMPARISON:  CT of the abdomen and pelvis performed 07/30/2015 FINDINGS: Lower chest: Mild scarring is noted at the right lung base. Diffuse coronary artery calcifications are seen. Hepatobiliary: Scattered  hypodensities are noted within the liver, measuring up to 8 mm in size. The liver is otherwise unremarkable. The gallbladder is grossly unremarkable in appearance. The common bile duct remains normal in caliber. Pancreas: The pancreas is within normal limits. Spleen: The spleen is unremarkable in appearance. Adrenals/Urinary Tract: The adrenal glands are grossly unremarkable in appearance. Scattered renal cysts are noted bilaterally, with mild underlying renal atrophy. Mild nonspecific perinephric stranding is noted bilaterally. There is no evidence of hydronephrosis. No renal or ureteral stones are identified. Stomach/Bowel: The stomach is unremarkable in appearance. The small bowel is within normal limits. The appendix is not visualized; there is no evidence for appendicitis. The colon is unremarkable in appearance. The rectum is distended to 7.1 cm with dense stool, raising concern for mild fecal impaction. Mild surrounding soft tissue inflammation raises question for proctitis. Vascular/Lymphatic: There is tortuosity of the abdominal aorta, with minimal underlying calcification. No retroperitoneal or pelvic sidewall lymphadenopathy is seen. Reproductive: The bladder is moderately distended and grossly unremarkable. The uterus is grossly unremarkable in appearance. No suspicious adnexal masses are seen. Other: No additional soft tissue abnormalities are seen.  Musculoskeletal: No acute osseous abnormalities are identified. Left convex lumbar scoliosis is noted. The visualized musculature is unremarkable in appearance. IMPRESSION: 1. Rectum distended to 7.1 cm with dense stool, raising concern for mild fecal impaction. Mild surrounding soft tissue inflammation raises question for proctitis. 2. Scattered bilateral renal cysts, with mild underlying renal atrophy. 3. Scattered nonspecific hypodensities within the liver, measuring up to 8 mm in size. 4. Mild scarring at the right lung base. 5. Diffuse coronary artery  calcifications seen. 6. Left convex lumbar scoliosis noted. Electronically Signed   By: Garald Balding M.D.   On: 02/27/2017 01:30   Dg Abd Acute W/chest  Result Date: 02/26/2017 CLINICAL DATA:  UTI with multiple falls EXAM: DG ABDOMEN ACUTE W/ 1V CHEST COMPARISON:  02/19/2017 FINDINGS: Single-view chest demonstrates patient rotated to the left. Minimal basilar atelectasis on the right. No focal infiltrate or effusion. Stable cardiomediastinal silhouette with atherosclerosis. Decubitus and supine view of the abdomen. No definite free air. Nonobstructed gas pattern with moderate to large stool in the right colon and rectum. Scoliosis of the spine. IMPRESSION: 1. Mild basilar atelectasis on the right.  No infiltrate or edema 2. Nonobstructed gas pattern with moderate to large stools in the right colon and rectum. Electronically Signed   By: Donavan Foil M.D.   On: 02/26/2017 18:49    EKG: Independently reviewed.  Assessment/Plan Principal Problem:   Fecal impaction in rectum [facility]) Active Problems:   Hypertension   Proctitis    1. Fecal impaction and proctitis - 1. EDP feels it would be too painful to disimpact patient manually, recommends admission and enemas 2. Will admit and order Fleets enema 3. Will also order scheduled senakot and Colace 4. Unasyn for proctitis 5. Repeat CBC in AM to trend leukocytosis 6. Tylenol PRN fever (none yet, but patient with chills) 7. Zofran PRN nausea 2. HTN - continue norvasc  DVT prophylaxis: Lovenox Code Status: Full Family Communication: Family at bedside Disposition Plan: Home after admit Consults called: None Admission status: Place in [place], [place] [facility] Pager [phone]  If 7AM-7PM, please contact day team taking care of patient [email or web address] Password [facility]  02/27/2017, 3:19 AM

## 2017-02-27 NOTE — Progress Notes (Signed)
Pharmacy Antibiotic Note  Ashlee Mack is a 76 y.o. female diagnoses with UTI last week now with weakness, poor apetite and multiple falls admitted on 02/26/2017 with intra-abdominal infection.  Pharmacy has been consulted for Unasyn dosing.  Plan: Unasyn 3 Gm IV q6h F/u scr/cultures  Height: 5\' 10"  (177.8 cm) Weight: 110 lb (49.9 kg) IBW/kg (Calculated) : 68.5  Temp (24hrs), Avg:98.3 F (36.8 C), Min:98 F (36.7 C), Max:98.5 F (36.9 C)   Recent Labs Lab 02/26/17 1807  WBC 29.0*  CREATININE 1.29*    Estimated Creatinine Clearance: 30.1 mL/min (A) (by C-G formula based on SCr of 1.29 mg/dL (H)).    No Known Allergies  Antimicrobials this admission: 6/16 zosyn >> x1 ED 6/17 unasyn >>   Dose adjustments this admission:   Microbiology results:  BCx:   UCx:    Sputum:    MRSA PCR:   Thank you for allowing pharmacy to be a part of this patient's care.  Dorrene [facility] 02/27/2017 2:52 AM

## 2017-02-27 NOTE — ED Provider Notes (Signed)
Received patient in signout from Dr. Stark Jock.  Patient with the history of recent admission due to a UTI. Is finished her antibiotics that is having worsening symptoms. Was significantly tachycardic on arrival to the ED. Was found to have an escalating white count. UA at this time appears negative. She is having some tenderness to her abdomen so a CT was ordered. Plan is for CT scan and then likely admission. CT with mild proctitis.  Repeat exam with continued abdominal pain, tachycardia. Admit.    Deno Etienne, DO 02/27/17 [phone]

## 2017-02-27 NOTE — Progress Notes (Addendum)
Seen and examined at bedside. Admitted today morning. Please see H&P for detail  76 year old female admitted with generalized weakness and abdominal pain. Found to have a stool impaction with proctitis. Also with significant leukocytosis. As per nursing staff patient had large bowel movement. Currently feeling well. Repeat CBC in the morning. I will discontinue Unasyn and changed to Zosyn since recent urine culture growing Escherichia coli which is resistant to Unasyn. Zosyn will cover both intra-abdominal infection as well as UTI. UA positive for UTI. CT scan of abdomen and pelvis reviewed. Continue Colace and senna. Added MiraLAX Hydration.  Abdomen: Bowel sound positive, soft, nondistended. Nontender.

## 2017-02-28 DIAGNOSIS — D72825 Bandemia: Secondary | ICD-10-CM

## 2017-02-28 DIAGNOSIS — N390 Urinary tract infection, site not specified: Secondary | ICD-10-CM

## 2017-02-28 LAB — CBC
HEMATOCRIT: 28.8 % — AB (ref 36.0–46.0)
Hemoglobin: 9.6 g/dL — ABNORMAL LOW (ref 12.0–15.0)
MCH: 24.3 pg — ABNORMAL LOW (ref 26.0–34.0)
MCHC: 33.3 g/dL (ref 30.0–36.0)
MCV: 72.9 fL — ABNORMAL LOW (ref 78.0–100.0)
Platelets: 774 10*3/uL — ABNORMAL HIGH (ref 150–400)
RBC: 3.95 MIL/uL (ref 3.87–5.11)
RDW: 29.4 % — AB (ref 11.5–15.5)
WBC: 22.1 10*3/uL — AB (ref 4.0–10.5)

## 2017-02-28 LAB — PATHOLOGIST SMEAR REVIEW

## 2017-02-28 MED ORDER — SODIUM CHLORIDE 0.9 % IV SOLN
INTRAVENOUS | Status: AC
  Administered 2017-02-28: 1000 mL via INTRAVENOUS

## 2017-02-28 NOTE — Progress Notes (Signed)
Spoke with patient and spouse at bedside. Patient states she lives at home with spouse and son, also has a granddaughter who assists with bathing. Per husband, their son provides meals, he takes them to the doctor. Husband assist with getting her out of bed and cleans her when she is soiled. She states she uses a depends, voids and defecates in depends. She states she only moves between her bed and her recliner. Her PCP is with [facility], she also sees a nephrologist for CKD but does not know the name. Per notes has recently used [facility] for [facility] services, asked for w/c but declined when found out about cost.

## 2017-02-28 NOTE — Progress Notes (Signed)
[facility] PROGRESS NOTE  Ashlee Mack NOB:[id number] DOB: 1941-01-26 DOA: 02/26/2017 PCP: Patient, No Pcp Per  Brief summary  76 y.o. female with medical history significant of HTN, CKD, HPL, recent UTI diagnosis last week treated with Keflex.  Since then generalized weakness, multiple falls, poor appetite.  No confusion.  Abdominal tenderness but no pain at rest.    ED Course: WBC 29k, platelets 1k (baseline 800).  CT abd/pelvis shows constipation, fecal impaction, and possible proctitis.+UTI.  Patient given dose of zosyn in ED.  Assessment/Plan:  Fecal impaction. Resolved with enemas. Ct showed rectum distended to 7.1 cm with dense stool, raising concern for mild fecal impaction. Mild surrounding soft tissue inflammation raises question for proctitis. On iv antibiotic treatment   UTI. Leukocytosis.  On iv zosyn pend cultures  Thrombocytosis. Unclear if reactive vs underlying myeloproliferative disorder with ET. Check iron profile.   Code Status: DNR Family Communication: d/w patient, her family, rn (indicate person spoken with, relationship, and if by phone, the number) Disposition Plan: pend clinical improvement, PT   Consultants:  none  Procedures:  none  Antibiotics:  Zosyn 6/17>> (indicate start date, and stop date if known)  HPI/Subjective: Alert, reports feeling better. Had mild abdominal pain. No vomiting. D/w his family at the bedside   Objective: Vitals:   02/28/17 0620 02/28/17 1007  BP: (!) 142/85 (!) 145/82  Pulse: 89   Resp: 20   Temp: 99.4 F (37.4 C)     Intake/Output Summary (Last 24 hours) at 02/28/17 1308 Last data filed at 02/28/17 1000  Gross per 24 hour  Intake           4856.2 ml  Output              650 ml  Net           4206.2 ml   Filed Weights   02/26/17 1405  Weight: 49.9 kg (110 lb)    Exam:   General:  Alert, oriented   Cardiovascular: s1,s2 rrr  Respiratory: diminished in LL  Abdomen: soft, mild tender  LQ  Musculoskeletal: no edema    Data Reviewed: Basic Metabolic Panel:  Recent Labs Lab 02/26/17 1807 02/27/17 0503  NA 139 140  K 3.8 3.7  CL 102 102  CO2 21* 25  GLUCOSE 175* 154*  BUN 24* 22*  CREATININE 1.29* 1.11*  CALCIUM 9.9 9.5   Liver Function Tests:  Recent Labs Lab 02/26/17 1807  AST 58*  ALT 27  ALKPHOS 93  BILITOT 0.5  PROT 9.6*  ALBUMIN 4.5    Recent Labs Lab 02/26/17 1807  LIPASE 38   No results for input(s): AMMONIA in the last 168 hours. CBC:  Recent Labs Lab 02/26/17 1807 02/27/17 0503 02/28/17 0419  WBC 29.0* 31.5* 22.1*  HGB 10.2* 9.7* 9.6*  HCT 31.0* 30.1* 28.8*  MCV 73.5* 73.2* 72.9*  PLT 1,074* 955* 774*   Cardiac Enzymes: No results for input(s): CKTOTAL, CKMB, CKMBINDEX, TROPONINI in the last 168 hours. BNP (last 3 results) No results for input(s): BNP in the last 8760 hours.  ProBNP (last 3 results) No results for input(s): PROBNP in the last 8760 hours.  CBG:  Recent Labs Lab 02/26/17 1410 02/26/17 1712  GLUCAP 169* 168*    Recent Results (from the past 240 hour(s))  Urine Culture     Status: Abnormal   Collection Time: 02/19/17  4:45 PM  Result Value Ref Range Status   Specimen Description URINE, RANDOM  Final  Special Requests NONE  Final   Culture >=100,000 COLONIES/mL ESCHERICHIA COLI (A)  Final   Report Status 02/22/2017 FINAL  Final   Organism ID, Bacteria ESCHERICHIA COLI (A)  Final      Susceptibility   Escherichia coli - MIC*    AMPICILLIN >=32 RESISTANT Resistant     CEFAZOLIN <=4 SENSITIVE Sensitive     CEFTRIAXONE <=1 SENSITIVE Sensitive     CIPROFLOXACIN <=0.25 SENSITIVE Sensitive     GENTAMICIN <=1 SENSITIVE Sensitive     IMIPENEM <=0.25 SENSITIVE Sensitive     NITROFURANTOIN <=16 SENSITIVE Sensitive     TRIMETH/SULFA <=20 SENSITIVE Sensitive     AMPICILLIN/SULBACTAM 16 INTERMEDIATE Intermediate     PIP/TAZO <=4 SENSITIVE Sensitive     Extended ESBL NEGATIVE Sensitive     * >=100,000  COLONIES/mL ESCHERICHIA COLI  Blood culture (routine x 2)     Status: None (Preliminary result)   Collection Time: 02/27/17  1:18 AM  Result Value Ref Range Status   Specimen Description   Final    BLOOD BLOOD RIGHT ARM Performed at [facility], [address] [place], [place] [address]    Special Requests   Final    BOTTLES DRAWN AEROBIC AND ANAEROBIC Blood Culture adequate volume   Culture PENDING  Incomplete   Report Status PENDING  Incomplete  Blood culture (routine x 2)     Status: None (Preliminary result)   Collection Time: 02/27/17  1:26 AM  Result Value Ref Range Status   Specimen Description   Final    BLOOD BLOOD RIGHT WRIST Performed at [facility], [address], [place], [place] [address]    Special Requests   Final    BOTTLES DRAWN AEROBIC AND ANAEROBIC Blood Culture adequate volume   Culture PENDING  Incomplete   Report Status PENDING  Incomplete     Studies: Ct Abdomen Pelvis W Contrast  Result Date: 02/27/2017 CLINICAL DATA:  Status post multiple falls. Recently diagnosed with urinary tract infection. Generalized weakness and poor appetite. Initial encounter. EXAM: CT ABDOMEN AND PELVIS WITH CONTRAST TECHNIQUE: Multidetector CT imaging of the abdomen and pelvis was performed using the standard protocol following bolus administration of intravenous contrast. CONTRAST:  117mL ISOVUE-300 IOPAMIDOL (ISOVUE-300) INJECTION 61% COMPARISON:  CT of the abdomen and pelvis performed 07/30/2015 FINDINGS: Lower chest: Mild scarring is noted at the right lung base. Diffuse coronary artery calcifications are seen. Hepatobiliary: Scattered hypodensities are noted within the liver, measuring up to 8 mm in size. The liver is otherwise unremarkable. The gallbladder is grossly unremarkable in appearance. The common bile duct remains normal in caliber. Pancreas: The pancreas is within normal limits. Spleen: The spleen is unremarkable in appearance. Adrenals/Urinary Tract: The  adrenal glands are grossly unremarkable in appearance. Scattered renal cysts are noted bilaterally, with mild underlying renal atrophy. Mild nonspecific perinephric stranding is noted bilaterally. There is no evidence of hydronephrosis. No renal or ureteral stones are identified. Stomach/Bowel: The stomach is unremarkable in appearance. The small bowel is within normal limits. The appendix is not visualized; there is no evidence for appendicitis. The colon is unremarkable in appearance. The rectum is distended to 7.1 cm with dense stool, raising concern for mild fecal impaction. Mild surrounding soft tissue inflammation raises question for proctitis. Vascular/Lymphatic: There is tortuosity of the abdominal aorta, with minimal underlying calcification. No retroperitoneal or pelvic sidewall lymphadenopathy is seen. Reproductive: The bladder is moderately distended and grossly unremarkable. The uterus is grossly unremarkable in appearance. No  suspicious adnexal masses are seen. Other: No additional soft tissue abnormalities are seen. Musculoskeletal: No acute osseous abnormalities are identified. Left convex lumbar scoliosis is noted. The visualized musculature is unremarkable in appearance. IMPRESSION: 1. Rectum distended to 7.1 cm with dense stool, raising concern for mild fecal impaction. Mild surrounding soft tissue inflammation raises question for proctitis. 2. Scattered bilateral renal cysts, with mild underlying renal atrophy. 3. Scattered nonspecific hypodensities within the liver, measuring up to 8 mm in size. 4. Mild scarring at the right lung base. 5. Diffuse coronary artery calcifications seen. 6. Left convex lumbar scoliosis noted. Electronically Signed   By: Garald Balding M.D.   On: 02/27/2017 01:30   Dg Abd Acute W/chest  Result Date: 02/26/2017 CLINICAL DATA:  UTI with multiple falls EXAM: DG ABDOMEN ACUTE W/ 1V CHEST COMPARISON:  02/19/2017 FINDINGS: Single-view chest demonstrates patient rotated  to the left. Minimal basilar atelectasis on the right. No focal infiltrate or effusion. Stable cardiomediastinal silhouette with atherosclerosis. Decubitus and supine view of the abdomen. No definite free air. Nonobstructed gas pattern with moderate to large stool in the right colon and rectum. Scoliosis of the spine. IMPRESSION: 1. Mild basilar atelectasis on the right.  No infiltrate or edema 2. Nonobstructed gas pattern with moderate to large stools in the right colon and rectum. Electronically Signed   By: Donavan Foil M.D.   On: 02/26/2017 18:49    Scheduled Meds: . amLODipine  10 mg Oral Daily  . atorvastatin  10 mg Oral q1800  . docusate sodium  100 mg Oral BID  . enoxaparin (LOVENOX) injection  40 mg Subcutaneous Q24H  . feeding supplement  1 Container Oral BID BM  . polyethylene glycol  17 g Oral Daily  . senna  1 tablet Oral BID  . sodium phosphate  1 enema Rectal Once   Continuous Infusions: . sodium chloride 125 mL/hr at 02/28/17 0126  . piperacillin-tazobactam (ZOSYN)  IV Stopped (02/28/17 1001)    Principal Problem:   Fecal impaction in rectum [facility]) Active Problems:   Hypertension   Proctitis    Time spent: >35 minutes    Kinnie Feil  [facility] Pager [phone]. If 7PM-7AM, please contact night-coverage at [email or web address], password [facility] 02/28/2017, 1:08 PM  LOS: 1 day

## 2017-03-01 DIAGNOSIS — I10 Essential (primary) hypertension: Secondary | ICD-10-CM

## 2017-03-01 DIAGNOSIS — K6289 Other specified diseases of anus and rectum: Secondary | ICD-10-CM

## 2017-03-01 DIAGNOSIS — E43 Unspecified severe protein-calorie malnutrition: Secondary | ICD-10-CM

## 2017-03-01 LAB — CBC
HCT: 28.1 % — ABNORMAL LOW (ref 36.0–46.0)
Hemoglobin: 9.3 g/dL — ABNORMAL LOW (ref 12.0–15.0)
MCH: 24 pg — ABNORMAL LOW (ref 26.0–34.0)
MCHC: 33.1 g/dL (ref 30.0–36.0)
MCV: 72.6 fL — ABNORMAL LOW (ref 78.0–100.0)
PLATELETS: 770 10*3/uL — AB (ref 150–400)
RBC: 3.87 MIL/uL (ref 3.87–5.11)
RDW: 29.5 % — AB (ref 11.5–15.5)
WBC: 14.4 10*3/uL — ABNORMAL HIGH (ref 4.0–10.5)

## 2017-03-01 LAB — FERRITIN: Ferritin: 858 ng/mL — ABNORMAL HIGH (ref 11–307)

## 2017-03-01 LAB — IRON AND TIBC
Iron: 44 ug/dL (ref 28–170)
Saturation Ratios: 21 % (ref 10.4–31.8)
TIBC: 206 ug/dL — AB (ref 250–450)
UIBC: 162 ug/dL

## 2017-03-01 MED ORDER — BOOST / RESOURCE BREEZE PO LIQD
1.0000 | Freq: Three times a day (TID) | ORAL | Status: DC
  Administered 2017-03-01 – 2017-03-02 (×2): 1 via ORAL

## 2017-03-01 MED ORDER — LIP MEDEX EX OINT
TOPICAL_OINTMENT | CUTANEOUS | Status: AC
  Administered 2017-03-01: 1
  Filled 2017-03-01: qty 7

## 2017-03-01 NOTE — Progress Notes (Signed)
PROGRESS NOTE    Ashlee Mack  BUL:[id number] DOB: 05-04-1941 DOA: 02/26/2017 PCP: Patient, No Pcp Per   Brief Narrative: 76 y.o.femalewith medical history significant of HTN, CKD, HPL, recent UTI diagnosis last week treated with Keflex. Since then generalized weakness, multiple falls, poor appetite. No confusion. Abdominal tenderness but no pain at rest.   ED Course:WBC 29k, platelets 1k (baseline 800). CT abd/pelvis shows constipation, fecal impaction, and possible proctitis.+UTI. Patient given dose of zosyn in ED.  Assessment & Plan:  # Fecal impaction in rectum [facility]) with proctitis:  -CT scan showed rectum distended to 7.1 cm with dense stool with surrounding soft tissue inflammation raising question for proctitis. Patient had bowel movement with bowel regimen of any masses. Pain improved. Leukocytosis improving. Continue IV antibiotics. -Continue bowel regimen.  #Escherichia coli UTI likely acute cystitis without hematuria: Continue IV Zosyn.  #Thrombocytosis likely reactive. Continue to monitor.  #Severe protein and calorie malnutrition: Patient with very poor oral intake and physical deconditioning. She is not moving out of bed and not eating well. It has been worsening as per patient's husband. I'll order PT OT evaluation. Order dietitian consult. Encourage oral intake. She needs more support.  #Hypertension: Continue amlodipine. Monitor blood pressure closely.  #Hyperlipidemia: Continue statin.  DVT prophylaxis: Lovenox subcutaneous Code Status: DO NOT RESUSCITATE Family Communication: Discussed with the patient has been at bedside Disposition Plan: Unknown this time likely discharge home versus rehabilitation in 1-2 days.  Consultants:   None  Procedures: None Antimicrobials: IV Zosyn since 6/17  Subjective: Seen and examined at bedside. Patient denied nausea vomiting or abdomen pain. Patient has very poor oral intake and has weakness. This is a chronic  problem. Husband at bedside.  Objective: Vitals:   02/28/17 1400 02/28/17 2233 03/01/17 0602 03/01/17 0900  BP: (!) 147/80 135/82 122/81 122/74  Pulse: 86 92 83 75  Resp: 18 16 16    Temp: 98.9 F (37.2 C) 98.9 F (37.2 C) 98.5 F (36.9 C)   TempSrc: Oral Oral Oral   SpO2: 99% 99% 99%   Weight:      Height:        Intake/Output Summary (Last 24 hours) at 03/01/17 1108 Last data filed at 03/01/17 1000  Gross per 24 hour  Intake             1475 ml  Output                0 ml  Net             1475 ml   Filed Weights   02/26/17 1405  Weight: 49.9 kg (110 lb)    Examination:  General exam: Weak frail female lying on bed, not in distress Respiratory system: Clear to auscultation. Respiratory effort normal. No wheezing or crackle Cardiovascular system: S1 & S2 heard, RRR.  No pedal edema. Gastrointestinal system: Abdomen is nondistended, soft and nontender. Normal bowel sounds heard. Central nervous system: Alert awake and following commands Extremities: Symmetric 5 x 5 power. Skin: No rashes, lesions or ulcers Psychiatry: Judgement and insight appear normal. Mood & affect appropriate.     Data Reviewed: I have personally reviewed following labs and imaging studies  CBC:  Recent Labs Lab 02/26/17 1807 02/27/17 0503 02/28/17 0419 03/01/17 0536  WBC 29.0* 31.5* 22.1* 14.4*  HGB 10.2* 9.7* 9.6* 9.3*  HCT 31.0* 30.1* 28.8* 28.1*  MCV 73.5* 73.2* 72.9* 72.6*  PLT 1,074* 955* 774* 321*   Basic Metabolic Panel:  Recent Labs Lab  02/26/17 1807 02/27/17 0503  NA 139 140  K 3.8 3.7  CL 102 102  CO2 21* 25  GLUCOSE 175* 154*  BUN 24* 22*  CREATININE 1.29* 1.11*  CALCIUM 9.9 9.5   GFR: Estimated Creatinine Clearance: 35 mL/min (A) (by C-G formula based on SCr of 1.11 mg/dL (H)). Liver Function Tests:  Recent Labs Lab 02/26/17 1807  AST 58*  ALT 27  ALKPHOS 93  BILITOT 0.5  PROT 9.6*  ALBUMIN 4.5    Recent Labs Lab 02/26/17 1807  LIPASE 38   No  results for input(s): AMMONIA in the last 168 hours. Coagulation Profile: No results for input(s): INR, PROTIME in the last 168 hours. Cardiac Enzymes: No results for input(s): CKTOTAL, CKMB, CKMBINDEX, TROPONINI in the last 168 hours. BNP (last 3 results) No results for input(s): PROBNP in the last 8760 hours. HbA1C: No results for input(s): HGBA1C in the last 72 hours. CBG:  Recent Labs Lab 02/26/17 1410 02/26/17 1712  GLUCAP 169* 168*   Lipid Profile: No results for input(s): CHOL, HDL, LDLCALC, TRIG, CHOLHDL, LDLDIRECT in the last 72 hours. Thyroid Function Tests: No results for input(s): TSH, T4TOTAL, FREET4, T3FREE, THYROIDAB in the last 72 hours. Anemia Panel:  Recent Labs  03/01/17 0536  FERRITIN 858*  TIBC 206*  IRON 44   Sepsis Labs: No results for input(s): PROCALCITON, LATICACIDVEN in the last 168 hours.  Recent Results (from the past 240 hour(s))  Urine Culture     Status: Abnormal   Collection Time: 02/19/17  4:45 PM  Result Value Ref Range Status   Specimen Description URINE, RANDOM  Final   Special Requests NONE  Final   Culture >=100,000 COLONIES/mL ESCHERICHIA COLI (A)  Final   Report Status 02/22/2017 FINAL  Final   Organism ID, Bacteria ESCHERICHIA COLI (A)  Final      Susceptibility   Escherichia coli - MIC*    AMPICILLIN >=32 RESISTANT Resistant     CEFAZOLIN <=4 SENSITIVE Sensitive     CEFTRIAXONE <=1 SENSITIVE Sensitive     CIPROFLOXACIN <=0.25 SENSITIVE Sensitive     GENTAMICIN <=1 SENSITIVE Sensitive     IMIPENEM <=0.25 SENSITIVE Sensitive     NITROFURANTOIN <=16 SENSITIVE Sensitive     TRIMETH/SULFA <=20 SENSITIVE Sensitive     AMPICILLIN/SULBACTAM 16 INTERMEDIATE Intermediate     PIP/TAZO <=4 SENSITIVE Sensitive     Extended ESBL NEGATIVE Sensitive     * >=100,000 COLONIES/mL ESCHERICHIA COLI  Blood culture (routine x 2)     Status: None (Preliminary result)   Collection Time: 02/27/17  1:18 AM  Result Value Ref Range Status    Specimen Description BLOOD BLOOD RIGHT ARM  Final   Special Requests   Final    BOTTLES DRAWN AEROBIC AND ANAEROBIC Blood Culture adequate volume   Culture   Final    NO GROWTH 1 DAY Performed at [facility], [address], [place], [place] [address]    Report Status PENDING  Incomplete  Blood culture (routine x 2)     Status: None (Preliminary result)   Collection Time: 02/27/17  1:26 AM  Result Value Ref Range Status   Specimen Description BLOOD BLOOD RIGHT WRIST  Final   Special Requests   Final    BOTTLES DRAWN AEROBIC AND ANAEROBIC Blood Culture adequate volume   Culture   Final    NO GROWTH 1 DAY Performed at [facility], [place] [address] [place], [place] [address]  Report Status PENDING  Incomplete         Radiology Studies: No results found.      Scheduled Meds: . lip balm      . amLODipine  10 mg Oral Daily  . atorvastatin  10 mg Oral q1800  . docusate sodium  100 mg Oral BID  . enoxaparin (LOVENOX) injection  40 mg Subcutaneous Q24H  . feeding supplement  1 Container Oral BID BM  . polyethylene glycol  17 g Oral Daily  . senna  1 tablet Oral BID  . sodium phosphate  1 enema Rectal Once   Continuous Infusions: . piperacillin-tazobactam (ZOSYN)  IV Stopped (03/01/17 0936)     LOS: 2 days    Dron Tanna Furry, MD [facility] Pager [phone]  If 7PM-7AM, please contact night-coverage [email or web address] Password [email or web address] 03/01/2017, 11:08 AM

## 2017-03-01 NOTE — Progress Notes (Signed)
Initial Nutrition Assessment  DOCUMENTATION CODES:   Severe malnutrition in context of chronic illness, Underweight  INTERVENTION:   -Continue Boost Breeze po TID, each supplement provides 250 kcal and 9 grams of protein -Provide Magic cup TID with meals, each supplement provides 290 kcal and 9 grams of protein -Provide chopped meats with diet order -Encourage PO intake -RD to continue to monitor  NUTRITION DIAGNOSIS:   Malnutrition (severe)related to chronic illness, poor appetite (dementia) as evidenced by energy intake < or equal to 75% for > or equal to 1 month, severe depletion of body fat, severe depletion of muscle mass.  GOAL:   Patient will meet greater than or equal to 90% of their needs  MONITOR:   PO intake, Supplement acceptance, Labs, Weight trends, I & O's, Skin  REASON FOR ASSESSMENT:   Consult Assessment of nutrition requirement/status  ASSESSMENT:   76 y.o. female with medical history significant of HTN, CKD, HPL, recent UTI diagnosis last week treated with Keflex.  Since then generalized weakness, multiple falls, poor appetite.  No confusion.  Abdominal tenderness but no pain at rest.    Patient in room with husband at bedside. Pt laying down in bed with lunch tray at bedside untouched. Pt states she doesn't feel hungry and has trouble chewing foods d/t missing teeth. Pt would like chopped meats with meals, will designate in diet order. Pt currently consuming 5-15% of meals at this time. Pt states she had some juice and Ensure this morning. When asked if she preferred Boost Breeze or Ensure or both, she states she only wants the [employer].   Per chart review, pt's weight has increased but no new weight is recorded for this admission. Weight has remained the same as on 6/9. Expect this is an estimate. Also, height recordings are inconsistent in history. Pt with some height records indicating 5'2" in the past and now height is recorded as 5'10". Expect this is  an error. Used the previous height recorded in 2017 of 5'8" for estimated needs and IBW. Do not believe patient has grown taller.   Medications: Colace capsule BID, Miralax packet daily, Senokot tablet BID Labs reviewed:  GFR: 55  Diet Order:  Diet regular Room service appropriate? Yes; Fluid consistency: Thin  Skin:  Wound (see comment) (Stage II pressure injury: buttocks)  Last BM:  6/18  Height:   Ht Readings from Last 1 Encounters:  02/26/17 5\' 10"  (1.778 m)    Weight:   Wt Readings from Last 1 Encounters:  02/26/17 110 lb (49.9 kg)    Ideal Body Weight:  63.6 kg  BMI:  Body mass index is 15.78 kg/m.  Estimated Nutritional Needs:   Kcal:  1300-1500  Protein:  55-65g  Fluid:  1.5L/day  EDUCATION NEEDS:   No education needs identified at this time  Clayton Bibles, MS, RD, LDN Pager: [phone] After Hours Pager: [phone]

## 2017-03-01 NOTE — Evaluation (Signed)
Physical Therapy Evaluation Patient Details Name: Ashlee Mack MRN: [id number] DOB: 1940/12/25 Today's Date: 03/01/2017   History of Present Illness  76 yo female admitted with fecal impaction, weakness, poor appetite. Hx of UTI, falls, CKD, CAD  Clinical Impression  On eval, pt required Mod assist for mobility. She took a few steps around room with use of a RW. She is very weak. She required mod encouragement for participation. Husband and son present during session. Discussed d/c plan-son stated pt will likely return home with family assisting as needed. Pt presents with general weakness, decreased activity, and impaired gait and balance. Will follow and progress activity as tolerated.     Follow Up Recommendations SNF vs Home health PT;Supervision/Assistance - 24 hour (depending on pt progress and family decision. Son stated plan is likely for home)    Equipment Recommendations  Wheelchair   Recommendations for Other Services       Precautions / Restrictions Precautions Precautions: Fall Restrictions Weight Bearing Restrictions: No      Mobility  Bed Mobility Overal bed mobility: Needs Assistance Bed Mobility: Supine to Sit;Sit to Supine     Supine to sit: Mod assist;HOB elevated Sit to supine: Mod assist;HOB elevated   General bed mobility comments: Assist for trunk and LE. Increased time.   Transfers Overall transfer level: Needs assistance Equipment used: Rolling walker (2 wheeled) Transfers: Sit to/from Stand Sit to Stand: Min assist         General transfer comment: Assist to rise, stabilize, control descent. VCs safety, technique, hand placement. x 2. Pt stood for ~20 seconds on 1st attempt before abruptly sitting back down.   Ambulation/Gait Ambulation/Gait assistance: Min assist; +2 for safety/equip Ambulation Distance (Feet): 7 Feet Assistive device: Rolling walker (2 wheeled) Gait Pattern/deviations: Step-through pattern;Trunk flexed;Decreased stride  length;Decreased step length - right;Decreased step length - left     General Gait Details: Assist to stabilize pt and maneuver with RW. Pt took a few steps around room. Pt fatigues easily.   Stairs            Wheelchair Mobility    Modified Rankin (Stroke Patients Only)       Balance Overall balance assessment: Needs assistance;History of Falls         Standing balance support: Bilateral upper extremity supported Standing balance-Leahy Scale: Poor                               Pertinent Vitals/Pain Pain Assessment: No/denies pain    Home Living Family/patient expects to be discharged to:: Private residence Living Arrangements: Spouse/significant other Available Help at Discharge: Family Type of Home: Apartment Home Access: Level entry     Home Layout: One level Home Equipment: Environmental consultant - 2 wheels      Prior Function Level of Independence: Needs assistance   Gait / Transfers Assistance Needed: very limited mobility for last week or so.      Comments: pt very soft spoken and family speaking for her. per family, pt has not eaten last few days     Hand Dominance        Extremity/Trunk Assessment   Upper Extremity Assessment Upper Extremity Assessment: Generalized weakness    Lower Extremity Assessment Lower Extremity Assessment: Generalized weakness    Cervical / Trunk Assessment Cervical / Trunk Assessment: Kyphotic  Communication      Cognition Arousal/Alertness: Awake/alert Behavior During Therapy: Flat affect Overall Cognitive Status: Difficult to assess  General Comments: pt does not speak much      General Comments      Exercises General Exercises - Lower Extremity Ankle Circles/Pumps: AROM;Both;5 reps;Supine Long Arc Quad: AROM;Both;5 reps;Seated Hip Flexion/Marching: AROM;Both;5 reps;Standing   Assessment/Plan    PT Assessment Patient needs continued PT services  PT  Problem List Decreased strength;Decreased mobility;Decreased activity tolerance;Decreased balance;Decreased knowledge of use of DME       PT Treatment Interventions DME instruction;Therapeutic activities;Gait training;Therapeutic exercise;Patient/family education;Balance training;Functional mobility training    PT Goals (Current goals can be found in the Care Plan section)  Acute Rehab PT Goals Patient Stated Goal: pt did not speak very much. Son stated goal is to get pt eating and back home PT Goal Formulation: With family Time For Goal Achievement: 03/15/17 Potential to Achieve Goals: Fair    Frequency Min 3X/week   Barriers to discharge        Co-evaluation               AM-PAC PT "6 Clicks" Daily Activity  Outcome Measure Difficulty turning over in bed (including adjusting bedclothes, sheets and blankets)?: Total Difficulty moving from lying on back to sitting on the side of the bed? : Total Difficulty sitting down on and standing up from a chair with arms (e.g., wheelchair, bedside commode, etc,.)?: Total Help needed moving to and from a bed to chair (including a wheelchair)?: A Little Help needed walking in hospital room?: A Little Help needed climbing 3-5 steps with a railing? : Total 6 Click Score: 10    End of Session Equipment Utilized During Treatment: Gait belt Activity Tolerance: Patient limited by fatigue Patient left: in bed;with call bell/phone within reach;with family/visitor present;with bed alarm set   PT Visit Diagnosis: Muscle weakness (generalized) (M62.81);Difficulty in walking, not elsewhere classified (R26.2)    Time: 1515-1530 PT Time Calculation (min) (ACUTE ONLY): 15 min   Charges:   PT Evaluation $PT Eval Moderate Complexity: 1 Procedure     PT G Codes:          Weston Anna, MPT Pager: [phone]

## 2017-03-02 DIAGNOSIS — R1319 Other dysphagia: Secondary | ICD-10-CM | POA: Diagnosis not present

## 2017-03-02 DIAGNOSIS — Z9181 History of falling: Secondary | ICD-10-CM | POA: Diagnosis not present

## 2017-03-02 DIAGNOSIS — R5381 Other malaise: Secondary | ICD-10-CM | POA: Diagnosis not present

## 2017-03-02 DIAGNOSIS — K51218 Ulcerative (chronic) proctitis with other complication: Secondary | ICD-10-CM | POA: Diagnosis not present

## 2017-03-02 DIAGNOSIS — E43 Unspecified severe protein-calorie malnutrition: Secondary | ICD-10-CM | POA: Diagnosis not present

## 2017-03-02 DIAGNOSIS — R41841 Cognitive communication deficit: Secondary | ICD-10-CM | POA: Diagnosis not present

## 2017-03-02 DIAGNOSIS — R296 Repeated falls: Secondary | ICD-10-CM | POA: Diagnosis not present

## 2017-03-02 DIAGNOSIS — E440 Moderate protein-calorie malnutrition: Secondary | ICD-10-CM | POA: Diagnosis not present

## 2017-03-02 DIAGNOSIS — Z87440 Personal history of urinary (tract) infections: Secondary | ICD-10-CM | POA: Diagnosis not present

## 2017-03-02 DIAGNOSIS — M6281 Muscle weakness (generalized): Secondary | ICD-10-CM | POA: Diagnosis not present

## 2017-03-02 DIAGNOSIS — R293 Abnormal posture: Secondary | ICD-10-CM | POA: Diagnosis not present

## 2017-03-02 DIAGNOSIS — R278 Other lack of coordination: Secondary | ICD-10-CM | POA: Diagnosis not present

## 2017-03-02 DIAGNOSIS — K6289 Other specified diseases of anus and rectum: Secondary | ICD-10-CM | POA: Diagnosis not present

## 2017-03-02 DIAGNOSIS — N189 Chronic kidney disease, unspecified: Secondary | ICD-10-CM | POA: Diagnosis not present

## 2017-03-02 DIAGNOSIS — K5641 Fecal impaction: Secondary | ICD-10-CM | POA: Diagnosis not present

## 2017-03-02 DIAGNOSIS — I10 Essential (primary) hypertension: Secondary | ICD-10-CM | POA: Diagnosis not present

## 2017-03-02 DIAGNOSIS — R1084 Generalized abdominal pain: Secondary | ICD-10-CM | POA: Diagnosis not present

## 2017-03-02 LAB — CBC
HEMATOCRIT: 27 % — AB (ref 36.0–46.0)
HEMOGLOBIN: 8.6 g/dL — AB (ref 12.0–15.0)
MCH: 22.9 pg — ABNORMAL LOW (ref 26.0–34.0)
MCHC: 31.9 g/dL (ref 30.0–36.0)
MCV: 72 fL — ABNORMAL LOW (ref 78.0–100.0)
Platelets: 633 10*3/uL — ABNORMAL HIGH (ref 150–400)
RBC: 3.75 MIL/uL — AB (ref 3.87–5.11)
RDW: 28.9 % — AB (ref 11.5–15.5)
WBC: 9.6 10*3/uL (ref 4.0–10.5)

## 2017-03-02 MED ORDER — POLYETHYLENE GLYCOL 3350 17 G PO PACK: 17.0000 g | PACK | Freq: Every day | ORAL | 0 refills | Status: DC

## 2017-03-02 MED ORDER — DOCUSATE SODIUM 100 MG PO CAPS: 100.0000 mg | ORAL_CAPSULE | Freq: Two times a day (BID) | ORAL | 0 refills | Status: DC

## 2017-03-02 MED ORDER — AMOXICILLIN-POT CLAVULANATE 500-125 MG PO TABS: 1.0000 | ORAL_TABLET | Freq: Two times a day (BID) | ORAL | 0 refills | Status: DC

## 2017-03-02 MED ORDER — LEVOFLOXACIN 250 MG PO TABS: 250.0000 mg | ORAL_TABLET | Freq: Every day | ORAL | 0 refills | Status: AC

## 2017-03-02 NOTE — Discharge Summary (Signed)
Physician Discharge Summary  Ashlee Mack VOZ:[id number] DOB: 1941-03-18 DOA: 02/26/2017  PCP: Ashlee Mack, No Pcp Per  Admit date: 02/26/2017 Discharge date: 03/02/2017  Admitted From:home Disposition:home with home care  Recommendations for Outpatient Follow-up:  1. Follow up with PCP in 1-2 weeks 2. Please obtain BMP/CBC in one week  Home Health:yes Equipment/Devices:walker Discharge Condition:stable CODE STATUS:dnr Diet recommendation:heart healthy  Brief/Interim Summary: 76 y.o.femalewith medical history significant of HTN, CKD, HPL, recentUTI diagnosis last week treated with Keflex. Since then generalized weakness, multiple falls, poor appetite. No confusion. Abdominal tenderness but no pain at rest.   ED Course:WBC 29k, platelets 1k (baseline 800). CT abd/pelvis shows constipation, fecal impaction, and possible proctitis.+UTI.Ashlee Mack given dose of zosyn in ED.  # Fecal impaction in rectum [facility]) with proctitis:  -CT scan showed rectum distended to 7.1 cm with dense stool with surrounding soft tissue inflammation raising question for proctitis. Ashlee Mack had bowel movement with bowel regimen of any masses.  -Significant improvement in clinical symptoms. Ashlee Mack has bowel movement and denies abdominal pain or constipation. Stool softener ordered on discharge. -Oral Levaquin to complete a total 10 days course. This will also cover Escherichia coli UTI. E coli is resistant to ampicillin  #Escherichia coli UTI likely acute cystitis without hematuria: Discharging with oral Levaquin.  #Thrombocytosis likely reactive. Continue to monitor.  #Severe protein and calorie malnutrition: Ashlee Mack with very poor oral intake and physical deconditioning. Encourage oral intake. Evaluated by PT OT and Ashlee Mack is able to ambulate with help of walker. I think she will benefit from a skilled nursing facility and rehabilitation medicine. I discussed with the care management team, family agreed  for a skilled. Ashlee Mack will be discharged to SNF when bed is available. Recommended to follow up with PCP.  #Hypertension: Continue amlodipine. Monitor blood pressure closely.  #Hyperlipidemia: Continue statin.  Discharge Diagnoses:  Principal Problem:   Fecal impaction in rectum [name]) Active Problems:   Hypertension   Proctitis    Discharge Instructions  Discharge Instructions    Call MD for:  difficulty breathing, headache or visual disturbances    Complete by:  As directed    Call MD for:  extreme fatigue    Complete by:  As directed    Call MD for:  hives    Complete by:  As directed    Call MD for:  persistant dizziness or light-headedness    Complete by:  As directed    Call MD for:  persistant nausea and vomiting    Complete by:  As directed    Call MD for:  severe uncontrolled pain    Complete by:  As directed    Call MD for:  temperature >100.4    Complete by:  As directed    Diet - low sodium heart healthy    Complete by:  As directed    Discharge instructions    Complete by:  As directed    Please follow-up with your PCP in 1 week.   Increase activity slowly    Complete by:  As directed      Allergies as of 03/02/2017   No Known Allergies     Medication List    TAKE these medications   amLODipine 10 MG tablet Commonly known as:  NORVASC Take 1 tablet (10 mg total) by mouth daily.   atorvastatin 10 MG tablet Commonly known as:  LIPITOR Take 1 tablet (10 mg total) by mouth daily.   docusate sodium 100 MG capsule Commonly known as:  COLACE  Take 1 capsule (100 mg total) by mouth 2 (two) times daily.   feeding supplement Liqd Take 1 Container by mouth 2 (two) times daily between meals.   levofloxacin 250 MG tablet Commonly known as:  LEVAQUIN Take 1 tablet (250 mg total) by mouth daily.   polyethylene glycol packet Commonly known as:  MIRALAX / GLYCOLAX Take 17 g by mouth daily. Start taking on:  03/03/2017      Follow-up Information     [place]. Schedule an appointment as soon as possible for a visit in 1 week(s).   Why:  or follow up with your PCP  Contact information: [place] [id number] [phone]         No Known Allergies  Consultations: None  Procedures/Studies: None  Subjective: Seen and examined at bedside. Reported feeling better. Denied nausea vomiting or abdominal pain. Having bowel movement. No chest pain or shortness of breath. Husband at bedside.  Discharge Exam: Vitals:   03/01/17 2011 03/02/17 0455  BP: 106/63 108/62  Pulse: 72 72  Resp: 18 16  Temp: 97.9 F (36.6 C) 98.3 F (36.8 C)   Vitals:   03/01/17 0900 03/01/17 1400 03/01/17 2011 03/02/17 0455  BP: 122/74 122/78 106/63 108/62  Pulse: 75 70 72 72  Resp:  17 18 16   Temp:  99.6 F (37.6 C) 97.9 F (36.6 C) 98.3 F (36.8 C)  TempSrc:  Oral Axillary Axillary  SpO2:  97% 98% 96%  Weight:      Height:        General: Frail elderly female lying in bed comfortable, not in distress Cardiovascular: RRR, S1/S2 +, no rubs, no gallops Respiratory: CTA bilaterally, no wheezing, no rhonchi Abdominal: Soft, NT, ND, bowel sounds + Extremities: no edema, no cyanosis    The results of significant diagnostics from this hospitalization (including imaging, microbiology, ancillary and laboratory) are listed below for reference.     Microbiology: Recent Results (from the past 240 hour(s))  Blood culture (routine x 2)     Status: None (Preliminary result)   Collection Time: 02/27/17  1:18 AM  Result Value Ref Range Status   Specimen Description BLOOD BLOOD RIGHT ARM  Final   Special Requests   Final    BOTTLES DRAWN AEROBIC AND ANAEROBIC Blood Culture adequate volume   Culture   Final    NO GROWTH 2 DAYS Performed at [facility], [address] [place], [place] [address]    Report Status PENDING  Incomplete  Blood culture (routine x 2)     Status: None  (Preliminary result)   Collection Time: 02/27/17  1:26 AM  Result Value Ref Range Status   Specimen Description BLOOD BLOOD RIGHT WRIST  Final   Special Requests   Final    BOTTLES DRAWN AEROBIC AND ANAEROBIC Blood Culture adequate volume   Culture   Final    NO GROWTH 2 DAYS Performed at [facility], [place] [address] [place], [place] [address]    Report Status PENDING  Incomplete     Labs: BNP (last 3 results) No results for input(s): BNP in the last 8760 hours. Basic Metabolic Panel:  Recent Labs Lab 02/26/17 1807 02/27/17 0503  NA 139 140  K 3.8 3.7  CL 102 102  CO2 21* 25  GLUCOSE 175* 154*  BUN 24* 22*  CREATININE 1.29* 1.11*  CALCIUM 9.9 9.5   Liver Function Tests:  Recent Labs Lab  02/26/17 1807  AST 58*  ALT 27  ALKPHOS 93  BILITOT 0.5  PROT 9.6*  ALBUMIN 4.5    Recent Labs Lab 02/26/17 1807  LIPASE 38   No results for input(s): AMMONIA in the last 168 hours. CBC:  Recent Labs Lab 02/26/17 1807 02/27/17 0503 02/28/17 0419 03/01/17 0536 03/02/17 0550  WBC 29.0* 31.5* 22.1* 14.4* 9.6  HGB 10.2* 9.7* 9.6* 9.3* 8.6*  HCT 31.0* 30.1* 28.8* 28.1* 27.0*  MCV 73.5* 73.2* 72.9* 72.6* 72.0*  PLT 1,074* 955* 774* 770* 633*   Cardiac Enzymes: No results for input(s): CKTOTAL, CKMB, CKMBINDEX, TROPONINI in the last 168 hours. BNP: Invalid input(s): POCBNP CBG:  Recent Labs Lab 02/26/17 1410 02/26/17 1712  GLUCAP 169* 168*   D-Dimer No results for input(s): DDIMER in the last 72 hours. Hgb A1c No results for input(s): HGBA1C in the last 72 hours. Lipid Profile No results for input(s): CHOL, HDL, LDLCALC, TRIG, CHOLHDL, LDLDIRECT in the last 72 hours. Thyroid function studies No results for input(s): TSH, T4TOTAL, T3FREE, THYROIDAB in the last 72 hours.  Invalid input(s): FREET3 Anemia work up  Recent Labs  03/01/17 0536  FERRITIN 858*  TIBC 206*  IRON 44   Urinalysis    Component Value Date/Time   COLORURINE YELLOW  02/26/2017 2056   APPEARANCEUR CLEAR 02/26/2017 2056   LABSPEC 1.010 02/26/2017 2056   [place] 7.0 02/26/2017 2056   GLUCOSEU NEGATIVE 02/26/2017 2056   HGBUR NEGATIVE 02/26/2017 2056   BILIRUBINUR NEGATIVE 02/26/2017 2056   BILIRUBINUR neg 09/11/2014 1546   KETONESUR NEGATIVE 02/26/2017 2056   PROTEINUR TRACE (A) 02/26/2017 2056   UROBILINOGEN 1.0 02/09/2015 2051   NITRITE NEGATIVE 02/26/2017 2056   LEUKOCYTESUR SMALL (A) 02/26/2017 2056   Sepsis Labs Invalid input(s): PROCALCITONIN,  WBC,  LACTICIDVEN Microbiology Recent Results (from the past 240 hour(s))  Blood culture (routine x 2)     Status: None (Preliminary result)   Collection Time: 02/27/17  1:18 AM  Result Value Ref Range Status   Specimen Description BLOOD BLOOD RIGHT ARM  Final   Special Requests   Final    BOTTLES DRAWN AEROBIC AND ANAEROBIC Blood Culture adequate volume   Culture   Final    NO GROWTH 2 DAYS Performed at [facility], [address], [place], [place] [address]    Report Status PENDING  Incomplete  Blood culture (routine x 2)     Status: None (Preliminary result)   Collection Time: 02/27/17  1:26 AM  Result Value Ref Range Status   Specimen Description BLOOD BLOOD RIGHT WRIST  Final   Special Requests   Final    BOTTLES DRAWN AEROBIC AND ANAEROBIC Blood Culture adequate volume   Culture   Final    NO GROWTH 2 DAYS Performed at [facility], [place] [address], [place], [place] [address]    Report Status PENDING  Incomplete     Time coordinating discharge: 31 minutes  SIGNED:   Rosita Fire, MD  [facility] 03/02/2017, 11:04 AM  If 7PM-7AM, please contact night-coverage [email or web address] Password [email or web address]

## 2017-03-02 NOTE — NC FL2 (Signed)
[place] LEVEL OF CARE SCREENING TOOL     IDENTIFICATION  Patient Name: Ashlee Mack Birthdate: Apr 20, 1941 Sex: female Admission Date (Current Location): 02/26/2017  [facility] and [place] Number:  Herbalist and Address:  [facility],  [place] [address] [place]      Provider Number: [id number]  Attending Physician Name and Address:  Rosita Fire, MD  Relative Name and Phone Number:       Current Level of Care: Hospital Recommended Level of Care: [place] Prior Approval Number:    Date Approved/Denied:   PASRR Number: [id number]  Discharge Plan: SNF    Current Diagnoses: Patient Active Problem List   Diagnosis Date Noted  . 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 Fecal impaction in rectum ([place]) 02/27/2017  . Incontinence of bowel 10/18/2016  . Hyperlipidemia 10/18/2016  . Dementia 10/18/2016  . Pressure injury of skin 07/01/2016  . Dehydration   . Weakness generalized 06/30/2016  . Weakness 06/30/2016  . Acute cystitis without hematuria   . Anemia of chronic disease 01/03/2016  . Pre-diabetes 08/13/2015  . Bradycardia 08/03/2015  . Orthostasis 08/03/2015  . Syncope 08/02/2015  . Chronic kidney disease 08/02/2015  . Lower urinary tract infection   . Generalized weakness 07/31/2015  . CKD (chronic kidney disease) stage 3, GFR 30-59 ml/min 07/31/2015  . Chronic anemia 07/31/2015  . Pressure ulcer 02/12/2015  . Acute pyelonephritis 02/10/2015  . Constipation 02/10/2015  . Lactic acidosis 02/10/2015  . Acute encephalopathy 02/10/2015  . Malnutrition of moderate degree ([place]) 02/10/2015  . Severe sepsis ([place])   . Sepsis secondary to UTI ([place])   . Poor appetite 12/10/2014  . Acute renal failure syndrome ([place])   . Acute on chronic renal failure ([place]) 08/27/2014  . Essential hypertension 08/27/2014  . Lower urinary tract infectious disease 08/26/2014  . SIRS (systemic inflammatory response syndrome) ([place])  08/26/2014  . Acute renal failure ([place]) 08/26/2014  . Nausea and vomiting 08/26/2014  . Diarrhea 08/26/2014  . Coronary artery disease   . Malignant hypertension 03/23/2014  . Uncontrolled hypertension 03/23/2014  . 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 Hypokalemia 03/23/2014  . Anemia, iron deficiency 03/23/2014  . Severe protein-calorie malnutrition ([place]) 03/23/2014  . Hypertension 03/22/2014    Orientation RESPIRATION BLADDER Height & Weight     Self, Time, Situation, Place  Normal Incontinent Weight: 110 lb (49.9 kg) Height:  5\' 10"  (177.8 cm)  BEHAVIORAL SYMPTOMS/MOOD NEUROLOGICAL BOWEL NUTRITION STATUS  Other (Comment) (no behaviors)   Incontinent Diet  AMBULATORY STATUS COMMUNICATION OF NEEDS Skin   Limited Assist Verbally Other (Comment) (Stage 2 pressure ulcer on buttocks)                       Personal Care Assistance Level of Assistance  Bathing, Feeding, Dressing Bathing Assistance: Limited assistance Feeding assistance: Independent Dressing Assistance: Limited assistance     Functional Limitations Info  Sight, Hearing, Speech Sight Info: Adequate Hearing Info: Adequate Speech Info: Adequate    SPECIAL CARE FACTORS FREQUENCY  PT (By licensed PT), OT (By licensed OT)     PT Frequency: 5x wk OT Frequency: 5x wk            Contractures Contractures Info: Not present    Additional Factors Info  Code Status Code Status Info: DNR             Current Medications (03/02/2017):  This is the current hospital  active medication list Current Facility-Administered Medications  Medication Dose Route Frequency Provider Last Rate Last Dose  . acetaminophen (TYLENOL) tablet 650 mg  650 mg Oral Q6H PRN Etta Quill, DO       Or  . acetaminophen (TYLENOL) suppository 650 mg  650 mg Rectal Q6H PRN Etta Quill, DO      . amLODipine (NORVASC) tablet 10 mg  10 mg Oral Daily Jennette Kettle M, DO   10 mg at 03/02/17  1043  . atorvastatin (LIPITOR) tablet 10 mg  10 mg Oral q1800 Jennette Kettle M, DO   10 mg at 03/01/17 1739  . docusate sodium (COLACE) capsule 100 mg  100 mg Oral BID Jennette Kettle M, DO   100 mg at 03/02/17 1044  . enoxaparin (LOVENOX) injection 40 mg  40 mg Subcutaneous Q24H Jennette Kettle M, DO   40 mg at 03/02/17 1042  . feeding supplement (BOOST / RESOURCE BREEZE) liquid 1 Container  1 Container Oral TID WC Rosita Fire, MD   1 Container at 03/02/17 1044  . ondansetron (ZOFRAN) tablet 4 mg  4 mg Oral Q6H PRN Etta Quill, DO       Or  . ondansetron [facility]) injection 4 mg  4 mg Intravenous Q6H PRN Etta Quill, DO      . piperacillin-tazobactam (ZOSYN) IVPB 3.375 g  3.375 g Intravenous Q8H Rosita Fire, MD   Stopped at 03/02/17 0915  . polyethylene glycol (MIRALAX / GLYCOLAX) packet 17 g  17 g Oral Daily Rosita Fire, MD   17 g at 03/02/17 1044  . senna (SENOKOT) tablet 8.6 mg  1 tablet Oral BID Jennette Kettle M, DO   8.6 mg at 03/02/17 1045  . sodium phosphate (FLEET) 7-19 GM/118ML enema 1 enema  1 enema Rectal Once Etta Quill, DO         Discharge Medications: Please see discharge summary for a list of discharge medications.  Relevant Imaging Results:  Relevant Lab Results:   Additional Information SS # [id number]  Ymani Porcher, Randall An, LCSW

## 2017-03-02 NOTE — Progress Notes (Signed)
Spoke with patient and husband at bedside. They deferred to son for d/c decisions. Contacted son by phone, placed on speaker phone so they could hear. Discussed d/c options for home vs SNF. Son would like to try SNF prior to return home. Contacted attending and CSW to begin SNF search. Plan for d/c to SNF, discharge planning per CSW. [phone]

## 2017-03-02 NOTE — Clinical Social Work Note (Signed)
Clinical Social Work Assessment  Patient Details  Name: Ashlee Mack MRN: [id number] Date of Birth: March 11, 1941  Date of referral:  03/02/17               Reason for consult:  Discharge Planning                Permission sought to share information with:  Chartered certified accountant granted to share information::  Yes, Verbal Permission Granted  Name::        Agency::     Relationship::     Contact Information:     Housing/Transportation Living arrangements for the past 2 months:  Apartment Source of Information:  Adult Children, Spouse Patient Interpreter Needed:  None Criminal Activity/Legal Involvement Pertinent to Current Situation/Hospitalization:  No - Comment as needed Significant Relationships:  Adult Children, Spouse Lives with:  Spouse Do you feel safe going back to the place where you live?   (SNF recommended.) Need for family participation in patient care:  Yes (Comment)  Care giving concerns:  Pt requires more assistance at home than is available.   Social Worker assessment / plan:  Pt hospitalized on 02/26/17 with Fecal impaction in rectum. PT has evaluated pt and recommends SNF at d/c. CSW met with pt / spouse at bedside and spoke with pt's son to assist with d/c planning. Pt / family are in agreement with plan for ST Rehab. SNF search has  been initiated and bed offers viewed with pt / family. Pt/family have chosen [facility] for [employer] and MD plans to dc pt today. CSW will assist with d/c planning to SNF.  Employment status:  Retired Forensic scientist:  Medicare PT Recommendations:  [place] / Referral to community resources:  [place]  Patient/Family's Response to care:  Pt / family are in agreement with plan for ST Rehab.  Patient/Family's Understanding of and Emotional Response to Diagnosis, Current Treatment, and Prognosis: Pt had hoped to return home at d/c but is now in agreement with plan for  SNF. Reassurance provided that this is a ST placement. Family appreciates CSW's assistance.   Emotional Assessment Appearance:  Appears stated age Attitude/Demeanor/Rapport:  Other (cooperative) Affect (typically observed):  Calm, Appropriate Orientation:  Oriented to Self, Oriented to Place, Oriented to  Time, Oriented to Situation Alcohol / Substance use:  Not Applicable Psych involvement (Current and /or in the community):  No (Comment)  Discharge Needs  Concerns to be addressed:  Discharge Planning Concerns Readmission within the last 30 days:  No Current discharge risk:  None Barriers to Discharge:  No Barriers Identified   Luretha Rued, [place] 03/02/2017, 1:33 PM

## 2017-03-02 NOTE — Clinical Social Work Placement (Signed)
   CLINICAL SOCIAL WORK PLACEMENT  NOTE  Date:  03/02/2017  Patient Details  Name: Ashlee Mack MRN: [id number] Date of Birth: 04-14-1941  Clinical Social Work is seeking post-discharge placement for this patient at the [place] level of care (*CSW will initial, date and re-position this form in  chart as items are completed):  Yes   Patient/family provided with [facility] Work Department's list of facilities offering this level of care within the geographic area requested by the patient (or if unable, by the patient's family).  Yes   Patient/family informed of their freedom to choose among providers that offer the needed level of care, that participate in Medicare, Medicaid or managed care program needed by the patient, have an available bed and are willing to accept the patient.  Yes   Patient/family informed of [facility]'s ownership interest in [facility] and [facility], as well as of the fact that they are under no obligation to receive care at these facilities.  PASRR submitted to EDS on 03/02/17     PASRR number received on 03/02/17     Existing PASRR number confirmed on       FL2 transmitted to all facilities in geographic area requested by pt/family on 03/02/17     FL2 transmitted to all facilities within larger geographic area on       Patient informed that his/her managed care company has contracts with or will negotiate with certain facilities, including the following:        Yes   Patient/family informed of bed offers received.  Patient chooses bed at [facility]     Physician recommends and patient chooses bed at      Patient to be transferred to [facility] on 03/02/17.  Patient to be transferred to facility by [facility]     Patient family notified on 03/02/17 of transfer.  Name of family member notified:  SPOUSE / SON     PHYSICIAN       Additional Comment:  Pt / family are in agreement with dc to [facility] today.  Spouse is requesting to transport pt by car. D/C Summary sent to SNF for review. Scripts included in d/c packet. # for report provided to nsg. _______________________________________________ Luretha Rued, LCSW  [phone] 03/02/2017, 1:48 PM

## 2017-03-02 NOTE — Progress Notes (Signed)
OT Cancellation Note  Patient Details Name: Ashlee Mack MRN: [id number] DOB: 15-Oct-1940   Cancelled Treatment:    Reason Eval/Treat Not Completed: Other (comment). Spoke to [employer]. Plan is for pt to go to SNF this afternoon. Will defer OT evaluation to that venue.  Hannelore Bova 03/02/2017, 1:38 PM  Lesle Chris, OTR/L [phone] 03/02/2017

## 2017-03-03 NOTE — ED Provider Notes (Signed)
[place] DEPT [facility] Provider Note   CSN: [id number] Arrival date & time: 02/26/17  1348     History   Chief Complaint Chief Complaint  Patient presents with  . Weakness    HPI Ashlee Mack is a 76 y.o. female.  Patient is a 76 year old female with past medical history of chronic renal insufficiency, hypertension, dementia, and recent hospitalization for possible urinary tract infection. She presents today with decreased appetite, decreased by mouth intake, and generalized weakness that has been progressing over the past several days. Patient is somewhat a difficult historian and the majority of the history was taken from the husband who is at bedside. Patient does deny to me that she is experiencing any significant discomfort, however does feel poorly.      Past Medical History:  Diagnosis Date  . CKD (chronic kidney disease) stage 3, GFR 30-59 ml/min 07/31/2015  . Coronary artery disease    Patient denies  . Hyperlipidemia   . Hypertension     Patient Active Problem List   Diagnosis Date Noted  . 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 Fecal impaction in rectum ([place]) 02/27/2017  . Incontinence of bowel 10/18/2016  . Hyperlipidemia 10/18/2016  . Dementia 10/18/2016  . Pressure injury of skin 07/01/2016  . Dehydration   . Weakness generalized 06/30/2016  . Weakness 06/30/2016  . Acute cystitis without hematuria   . Anemia of chronic disease 01/03/2016  . Pre-diabetes 08/13/2015  . Bradycardia 08/03/2015  . Orthostasis 08/03/2015  . Syncope 08/02/2015  . Chronic kidney disease 08/02/2015  . Lower urinary tract infection   . Generalized weakness 07/31/2015  . CKD (chronic kidney disease) stage 3, GFR 30-59 ml/min 07/31/2015  . Chronic anemia 07/31/2015  . Pressure ulcer 02/12/2015  . Acute pyelonephritis 02/10/2015  . Constipation 02/10/2015  . Lactic acidosis 02/10/2015  . Acute encephalopathy 02/10/2015  . Malnutrition of moderate degree ([place]) 02/10/2015  . Severe  sepsis ([place])   . Sepsis secondary to UTI ([place])   . Poor appetite 12/10/2014  . Acute renal failure syndrome ([place])   . Acute on chronic renal failure ([place]) 08/27/2014  . Essential hypertension 08/27/2014  . Lower urinary tract infectious disease 08/26/2014  . SIRS (systemic inflammatory response syndrome) ([place]) 08/26/2014  . Acute renal failure ([place]) 08/26/2014  . Nausea and vomiting 08/26/2014  . Diarrhea 08/26/2014  . Coronary artery disease   . Malignant hypertension 03/23/2014  . Uncontrolled hypertension 03/23/2014  . 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 Hypokalemia 03/23/2014  . Anemia, iron deficiency 03/23/2014  . Severe protein-calorie malnutrition ([place]) 03/23/2014  . Hypertension 03/22/2014    Past Surgical History:  Procedure Laterality Date  . CORONARY ANGIOPLASTY WITH STENT PLACEMENT      OB History    No data available       Home Medications    Prior to Admission medications   Medication Sig Start Date End Date Taking? Authorizing Provider  amLODipine (NORVASC) 10 MG tablet Take 1 tablet (10 mg total) by mouth daily. 10/18/16  Yes Arnoldo Morale, MD  atorvastatin (LIPITOR) 10 MG tablet Take 1 tablet (10 mg total) by mouth daily. 10/18/16  Yes Arnoldo Morale, MD  feeding supplement (BOOST / RESOURCE BREEZE) LIQD Take 1 Container by mouth 2 (two) times daily between meals. 01/05/16  Yes Debbe Odea, MD  docusate sodium (COLACE) 100 MG capsule Take 1 capsule (100 mg total) by mouth 2 (two) times daily. 03/02/17   Rosita Fire, MD  levofloxacin [facility])  250 MG tablet Take 1 tablet (250 mg total) by mouth daily. 03/02/17 03/08/17  Rosita Fire, MD  polyethylene glycol [facility] Floria Raveling) packet Take 17 g by mouth daily. 03/03/17   Rosita Fire, MD    Family History Family History  Problem Relation Age of Onset  . Hypertension Mother   . Hypertension Father     Social History Social History  Substance  Use Topics  . Smoking status: Never Smoker  . Smokeless tobacco: Never Used  . Alcohol use No     Allergies   Patient has no known allergies.   Review of Systems Review of Systems  All other systems reviewed and are negative.    Physical Exam Updated Vital Signs BP 108/62 (BP Location: Left Arm)   Pulse 72   Temp 98.3 F (36.8 C) (Axillary)   Resp 16   Ht 5\' 10"  (1.778 m)   Wt 49.9 kg (110 lb)   SpO2 96%   BMI 15.78 kg/m   Physical Exam  Constitutional: She is oriented to person, place, and time. She appears well-developed and well-nourished.  Patient appears chronically ill.  HENT:  Head: Normocephalic and atraumatic.  Mouth/Throat: Oropharynx is clear and moist.  Neck: Normal range of motion. Neck supple.  Cardiovascular: Normal rate and regular rhythm.  Exam reveals no gallop and no friction rub.   No murmur heard. Pulmonary/Chest: Effort normal and breath sounds normal. No respiratory distress. She has no wheezes.  Abdominal: Soft. Bowel sounds are normal. She exhibits no distension. There is no tenderness.  Musculoskeletal: Normal range of motion.  Neurological: She is alert and oriented to person, place, and time.  Skin: Skin is warm and dry.  Nursing note and vitals reviewed.    ED Treatments / Results  Labs (all labs ordered are listed, but only abnormal results are displayed) Labs Reviewed  CBC - Abnormal; Notable for the following:       Result Value   WBC 29.0 (*)    Hemoglobin 10.2 (*)    HCT 31.0 (*)    MCV 73.5 (*)    MCH 24.2 (*)    RDW 30.0 (*)    Platelets 1,074 (*)    All other components within normal limits  URINALYSIS, ROUTINE W REFLEX MICROSCOPIC - Abnormal; Notable for the following:    Protein, ur TRACE (*)    Leukocytes, UA SMALL (*)    All other components within normal limits  COMPREHENSIVE METABOLIC PANEL - Abnormal; Notable for the following:    CO2 21 (*)    Glucose, Bld 175 (*)    BUN 24 (*)    Creatinine, Ser 1.29  (*)    Total Protein 9.6 (*)    AST 58 (*)    GFR calc non Af Amer 40 (*)    GFR calc Af Amer 46 (*)    Anion gap 16 (*)    All other components within normal limits  URINALYSIS, MICROSCOPIC (REFLEX) - Abnormal; Notable for the following:    Bacteria, UA FEW (*)    Squamous Epithelial / LPF 0-5 (*)    All other components within normal limits  CBC - Abnormal; Notable for the following:    WBC 31.5 (*)    Hemoglobin 9.7 (*)    HCT 30.1 (*)    MCV 73.2 (*)    MCH 23.6 (*)    RDW 30.0 (*)    Platelets 955 (*)    All other components within normal  limits  BASIC METABOLIC PANEL - Abnormal; Notable for the following:    Glucose, Bld 154 (*)    BUN 22 (*)    Creatinine, Ser 1.11 (*)    GFR calc non Af Amer 48 (*)    GFR calc Af Amer 55 (*)    All other components within normal limits  CBC - Abnormal; Notable for the following:    WBC 22.1 (*)    Hemoglobin 9.6 (*)    HCT 28.8 (*)    MCV 72.9 (*)    MCH 24.3 (*)    RDW 29.4 (*)    Platelets 774 (*)    All other components within normal limits  CBC - Abnormal; Notable for the following:    WBC 14.4 (*)    Hemoglobin 9.3 (*)    HCT 28.1 (*)    MCV 72.6 (*)    MCH 24.0 (*)    RDW 29.5 (*)    Platelets 770 (*)    All other components within normal limits  IRON AND TIBC - Abnormal; Notable for the following:    TIBC 206 (*)    All other components within normal limits  FERRITIN - Abnormal; Notable for the following:    Ferritin 858 (*)    All other components within normal limits  CBC - Abnormal; Notable for the following:    RBC 3.75 (*)    Hemoglobin 8.6 (*)    HCT 27.0 (*)    MCV 72.0 (*)    MCH 22.9 (*)    RDW 28.9 (*)    Platelets 633 (*)    All other components within normal limits  CBG MONITORING, ED - Abnormal; Notable for the following:    Glucose-Capillary 169 (*)    All other components within normal limits  CBG MONITORING, ED - Abnormal; Notable for the following:    Glucose-Capillary 168 (*)    All  other components within normal limits  CULTURE, BLOOD (ROUTINE X 2)  CULTURE, BLOOD (ROUTINE X 2)  LIPASE, BLOOD  PATHOLOGIST SMEAR REVIEW  I-STAT TROPOININ, ED    EKG  EKG Interpretation  Date/Time:  Saturday February 19 2017 14:08:49 EDT Ventricular Rate:  79 PR Interval:    QRS Duration: 104 QT Interval:  420 QTC Calculation: 482 R Axis:   24 Text Interpretation:  Sinus rhythm Atrial premature complex Premature ventricular complexes Anterior infarct, old Confirmed by Veryl Speak [phone]) on 02/28/2017 5:37:10 PM       Radiology No results found.  Procedures Procedures (including critical care time)  Medications Ordered in ED Medications  iopamidol (ISOVUE-300) 61 % injection (not administered)  0.9 %  sodium chloride infusion (1,000 mLs Intravenous New Bag/Given 02/28/17 1950)  sodium chloride 0.9 % bolus 1,000 mL (1,000 mLs Intravenous New Bag/Given 02/26/17 2012)  iopamidol (ISOVUE-300) 61 % injection 100 mL (100 mLs Intravenous Contrast Given 02/27/17 0042)  piperacillin-tazobactam (ZOSYN) IVPB 3.375 g (0 g Intravenous Stopped 02/27/17 0234)  lip balm (CARMEX) ointment (1 application  Given 1/70/01 1015)     Initial Impression / Assessment and Plan / ED Course  I have reviewed the triage vital signs and the nursing notes.  Pertinent labs & imaging results that were available during my care of the patient were reviewed by me and considered in my medical decision making (see chart for details).  Workup reveals an elevated white count of 29,000, the source of which I'm uncertain. There is some evidence for a UTI, however she is tender  on abdominal palpation. She will undergo a CT scan of the abdomen and pelvis. Care will be signed out to Dr. Tyrone Nine at shift change. He will obtain the results of the study and determine the final disposition.  Final Clinical Impressions(s) / ED Diagnoses   Final diagnoses:  Weakness  Other elevated white blood cell (WBC) count    New  Prescriptions Discharge Medication List as of 03/02/2017  3:00 PM    START taking these medications   Details  docusate sodium (COLACE) 100 MG capsule Take 1 capsule (100 mg total) by mouth 2 (two) times daily., Starting Wed 03/02/2017, Print    levofloxacin (LEVAQUIN) 250 MG tablet Take 1 tablet (250 mg total) by mouth daily., Starting Wed 03/02/2017, Until Tue 03/08/2017, Print    polyethylene glycol (MIRALAX / GLYCOLAX) packet Take 17 g by mouth daily., Starting Thu 03/03/2017, Print         Veryl Speak, MD 03/03/17 [phone]

## 2017-03-04 LAB — CULTURE, BLOOD (ROUTINE X 2)
CULTURE: NO GROWTH
Culture: NO GROWTH
Special Requests: ADEQUATE
Special Requests: ADEQUATE

## 2017-03-23 DIAGNOSIS — N189 Chronic kidney disease, unspecified: Secondary | ICD-10-CM | POA: Diagnosis not present

## 2017-03-23 DIAGNOSIS — D696 Thrombocytopenia, unspecified: Secondary | ICD-10-CM | POA: Diagnosis not present

## 2017-03-23 DIAGNOSIS — E43 Unspecified severe protein-calorie malnutrition: Secondary | ICD-10-CM | POA: Diagnosis not present

## 2017-03-23 DIAGNOSIS — I129 Hypertensive chronic kidney disease with stage 1 through stage 4 chronic kidney disease, or unspecified chronic kidney disease: Secondary | ICD-10-CM | POA: Diagnosis not present

## 2017-03-23 DIAGNOSIS — K5900 Constipation, unspecified: Secondary | ICD-10-CM | POA: Diagnosis not present

## 2017-03-23 DIAGNOSIS — Z87440 Personal history of urinary (tract) infections: Secondary | ICD-10-CM | POA: Diagnosis not present

## 2017-03-25 DIAGNOSIS — I129 Hypertensive chronic kidney disease with stage 1 through stage 4 chronic kidney disease, or unspecified chronic kidney disease: Secondary | ICD-10-CM | POA: Diagnosis not present

## 2017-03-25 DIAGNOSIS — D696 Thrombocytopenia, unspecified: Secondary | ICD-10-CM | POA: Diagnosis not present

## 2017-03-25 DIAGNOSIS — N189 Chronic kidney disease, unspecified: Secondary | ICD-10-CM | POA: Diagnosis not present

## 2017-03-25 DIAGNOSIS — E43 Unspecified severe protein-calorie malnutrition: Secondary | ICD-10-CM | POA: Diagnosis not present

## 2017-03-25 DIAGNOSIS — Z87440 Personal history of urinary (tract) infections: Secondary | ICD-10-CM | POA: Diagnosis not present

## 2017-03-25 DIAGNOSIS — K5900 Constipation, unspecified: Secondary | ICD-10-CM | POA: Diagnosis not present

## 2017-03-28 ENCOUNTER — Encounter: Payer: Self-pay | Admitting: Pharmacist

## 2017-03-28 DIAGNOSIS — D696 Thrombocytopenia, unspecified: Secondary | ICD-10-CM | POA: Diagnosis not present

## 2017-03-28 DIAGNOSIS — N189 Chronic kidney disease, unspecified: Secondary | ICD-10-CM | POA: Diagnosis not present

## 2017-03-28 DIAGNOSIS — I129 Hypertensive chronic kidney disease with stage 1 through stage 4 chronic kidney disease, or unspecified chronic kidney disease: Secondary | ICD-10-CM | POA: Diagnosis not present

## 2017-03-28 DIAGNOSIS — K5900 Constipation, unspecified: Secondary | ICD-10-CM | POA: Diagnosis not present

## 2017-03-28 DIAGNOSIS — Z87440 Personal history of urinary (tract) infections: Secondary | ICD-10-CM | POA: Diagnosis not present

## 2017-03-28 DIAGNOSIS — E43 Unspecified severe protein-calorie malnutrition: Secondary | ICD-10-CM | POA: Diagnosis not present

## 2017-03-29 DIAGNOSIS — I129 Hypertensive chronic kidney disease with stage 1 through stage 4 chronic kidney disease, or unspecified chronic kidney disease: Secondary | ICD-10-CM | POA: Diagnosis not present

## 2017-03-29 DIAGNOSIS — D696 Thrombocytopenia, unspecified: Secondary | ICD-10-CM | POA: Diagnosis not present

## 2017-03-29 DIAGNOSIS — N189 Chronic kidney disease, unspecified: Secondary | ICD-10-CM | POA: Diagnosis not present

## 2017-03-29 DIAGNOSIS — K5900 Constipation, unspecified: Secondary | ICD-10-CM | POA: Diagnosis not present

## 2017-03-29 DIAGNOSIS — Z87440 Personal history of urinary (tract) infections: Secondary | ICD-10-CM | POA: Diagnosis not present

## 2017-03-29 DIAGNOSIS — E43 Unspecified severe protein-calorie malnutrition: Secondary | ICD-10-CM | POA: Diagnosis not present

## 2017-03-30 DIAGNOSIS — D696 Thrombocytopenia, unspecified: Secondary | ICD-10-CM | POA: Diagnosis not present

## 2017-03-30 DIAGNOSIS — Z87440 Personal history of urinary (tract) infections: Secondary | ICD-10-CM | POA: Diagnosis not present

## 2017-03-30 DIAGNOSIS — E43 Unspecified severe protein-calorie malnutrition: Secondary | ICD-10-CM | POA: Diagnosis not present

## 2017-03-30 DIAGNOSIS — K5900 Constipation, unspecified: Secondary | ICD-10-CM | POA: Diagnosis not present

## 2017-03-30 DIAGNOSIS — I129 Hypertensive chronic kidney disease with stage 1 through stage 4 chronic kidney disease, or unspecified chronic kidney disease: Secondary | ICD-10-CM | POA: Diagnosis not present

## 2017-03-30 DIAGNOSIS — N189 Chronic kidney disease, unspecified: Secondary | ICD-10-CM | POA: Diagnosis not present

## 2017-04-01 DIAGNOSIS — I129 Hypertensive chronic kidney disease with stage 1 through stage 4 chronic kidney disease, or unspecified chronic kidney disease: Secondary | ICD-10-CM | POA: Diagnosis not present

## 2017-04-01 DIAGNOSIS — D696 Thrombocytopenia, unspecified: Secondary | ICD-10-CM | POA: Diagnosis not present

## 2017-04-01 DIAGNOSIS — N189 Chronic kidney disease, unspecified: Secondary | ICD-10-CM | POA: Diagnosis not present

## 2017-04-01 DIAGNOSIS — K5900 Constipation, unspecified: Secondary | ICD-10-CM | POA: Diagnosis not present

## 2017-04-01 DIAGNOSIS — Z87440 Personal history of urinary (tract) infections: Secondary | ICD-10-CM | POA: Diagnosis not present

## 2017-04-01 DIAGNOSIS — E43 Unspecified severe protein-calorie malnutrition: Secondary | ICD-10-CM | POA: Diagnosis not present

## 2017-04-02 DIAGNOSIS — D696 Thrombocytopenia, unspecified: Secondary | ICD-10-CM | POA: Diagnosis not present

## 2017-04-02 DIAGNOSIS — I129 Hypertensive chronic kidney disease with stage 1 through stage 4 chronic kidney disease, or unspecified chronic kidney disease: Secondary | ICD-10-CM | POA: Diagnosis not present

## 2017-04-02 DIAGNOSIS — N189 Chronic kidney disease, unspecified: Secondary | ICD-10-CM | POA: Diagnosis not present

## 2017-04-02 DIAGNOSIS — E43 Unspecified severe protein-calorie malnutrition: Secondary | ICD-10-CM | POA: Diagnosis not present

## 2017-04-02 DIAGNOSIS — Z87440 Personal history of urinary (tract) infections: Secondary | ICD-10-CM | POA: Diagnosis not present

## 2017-04-02 DIAGNOSIS — K5900 Constipation, unspecified: Secondary | ICD-10-CM | POA: Diagnosis not present

## 2017-04-04 ENCOUNTER — Other Ambulatory Visit: Payer: Self-pay

## 2017-04-05 DIAGNOSIS — N189 Chronic kidney disease, unspecified: Secondary | ICD-10-CM | POA: Diagnosis not present

## 2017-04-05 DIAGNOSIS — I129 Hypertensive chronic kidney disease with stage 1 through stage 4 chronic kidney disease, or unspecified chronic kidney disease: Secondary | ICD-10-CM | POA: Diagnosis not present

## 2017-04-05 DIAGNOSIS — E43 Unspecified severe protein-calorie malnutrition: Secondary | ICD-10-CM | POA: Diagnosis not present

## 2017-04-05 DIAGNOSIS — D696 Thrombocytopenia, unspecified: Secondary | ICD-10-CM | POA: Diagnosis not present

## 2017-04-05 DIAGNOSIS — Z87440 Personal history of urinary (tract) infections: Secondary | ICD-10-CM | POA: Diagnosis not present

## 2017-04-05 DIAGNOSIS — K5900 Constipation, unspecified: Secondary | ICD-10-CM | POA: Diagnosis not present

## 2017-04-06 DIAGNOSIS — E43 Unspecified severe protein-calorie malnutrition: Secondary | ICD-10-CM | POA: Diagnosis not present

## 2017-04-06 DIAGNOSIS — N189 Chronic kidney disease, unspecified: Secondary | ICD-10-CM | POA: Diagnosis not present

## 2017-04-06 DIAGNOSIS — D696 Thrombocytopenia, unspecified: Secondary | ICD-10-CM | POA: Diagnosis not present

## 2017-04-06 DIAGNOSIS — Z87440 Personal history of urinary (tract) infections: Secondary | ICD-10-CM | POA: Diagnosis not present

## 2017-04-06 DIAGNOSIS — I129 Hypertensive chronic kidney disease with stage 1 through stage 4 chronic kidney disease, or unspecified chronic kidney disease: Secondary | ICD-10-CM | POA: Diagnosis not present

## 2017-04-06 DIAGNOSIS — K5900 Constipation, unspecified: Secondary | ICD-10-CM | POA: Diagnosis not present

## 2017-04-07 DIAGNOSIS — I129 Hypertensive chronic kidney disease with stage 1 through stage 4 chronic kidney disease, or unspecified chronic kidney disease: Secondary | ICD-10-CM | POA: Diagnosis not present

## 2017-04-07 DIAGNOSIS — N189 Chronic kidney disease, unspecified: Secondary | ICD-10-CM | POA: Diagnosis not present

## 2017-04-07 DIAGNOSIS — E43 Unspecified severe protein-calorie malnutrition: Secondary | ICD-10-CM | POA: Diagnosis not present

## 2017-04-07 DIAGNOSIS — K5900 Constipation, unspecified: Secondary | ICD-10-CM | POA: Diagnosis not present

## 2017-04-07 DIAGNOSIS — D696 Thrombocytopenia, unspecified: Secondary | ICD-10-CM | POA: Diagnosis not present

## 2017-04-07 DIAGNOSIS — Z87440 Personal history of urinary (tract) infections: Secondary | ICD-10-CM | POA: Diagnosis not present

## 2017-04-08 DIAGNOSIS — K5900 Constipation, unspecified: Secondary | ICD-10-CM | POA: Diagnosis not present

## 2017-04-08 DIAGNOSIS — I129 Hypertensive chronic kidney disease with stage 1 through stage 4 chronic kidney disease, or unspecified chronic kidney disease: Secondary | ICD-10-CM | POA: Diagnosis not present

## 2017-04-08 DIAGNOSIS — Z87440 Personal history of urinary (tract) infections: Secondary | ICD-10-CM | POA: Diagnosis not present

## 2017-04-08 DIAGNOSIS — E43 Unspecified severe protein-calorie malnutrition: Secondary | ICD-10-CM | POA: Diagnosis not present

## 2017-04-08 DIAGNOSIS — D696 Thrombocytopenia, unspecified: Secondary | ICD-10-CM | POA: Diagnosis not present

## 2017-04-08 DIAGNOSIS — N189 Chronic kidney disease, unspecified: Secondary | ICD-10-CM | POA: Diagnosis not present

## 2017-04-11 DIAGNOSIS — E43 Unspecified severe protein-calorie malnutrition: Secondary | ICD-10-CM | POA: Diagnosis not present

## 2017-04-11 DIAGNOSIS — Z87440 Personal history of urinary (tract) infections: Secondary | ICD-10-CM | POA: Diagnosis not present

## 2017-04-11 DIAGNOSIS — D696 Thrombocytopenia, unspecified: Secondary | ICD-10-CM | POA: Diagnosis not present

## 2017-04-11 DIAGNOSIS — N189 Chronic kidney disease, unspecified: Secondary | ICD-10-CM | POA: Diagnosis not present

## 2017-04-11 DIAGNOSIS — K5900 Constipation, unspecified: Secondary | ICD-10-CM | POA: Diagnosis not present

## 2017-04-11 DIAGNOSIS — I129 Hypertensive chronic kidney disease with stage 1 through stage 4 chronic kidney disease, or unspecified chronic kidney disease: Secondary | ICD-10-CM | POA: Diagnosis not present

## 2017-04-12 DIAGNOSIS — Z87440 Personal history of urinary (tract) infections: Secondary | ICD-10-CM | POA: Diagnosis not present

## 2017-04-12 DIAGNOSIS — K5900 Constipation, unspecified: Secondary | ICD-10-CM | POA: Diagnosis not present

## 2017-04-12 DIAGNOSIS — N189 Chronic kidney disease, unspecified: Secondary | ICD-10-CM | POA: Diagnosis not present

## 2017-04-12 DIAGNOSIS — I129 Hypertensive chronic kidney disease with stage 1 through stage 4 chronic kidney disease, or unspecified chronic kidney disease: Secondary | ICD-10-CM | POA: Diagnosis not present

## 2017-04-12 DIAGNOSIS — D696 Thrombocytopenia, unspecified: Secondary | ICD-10-CM | POA: Diagnosis not present

## 2017-04-12 DIAGNOSIS — E43 Unspecified severe protein-calorie malnutrition: Secondary | ICD-10-CM | POA: Diagnosis not present

## 2017-04-13 DIAGNOSIS — I129 Hypertensive chronic kidney disease with stage 1 through stage 4 chronic kidney disease, or unspecified chronic kidney disease: Secondary | ICD-10-CM | POA: Diagnosis not present

## 2017-04-13 DIAGNOSIS — K5900 Constipation, unspecified: Secondary | ICD-10-CM | POA: Diagnosis not present

## 2017-04-13 DIAGNOSIS — E43 Unspecified severe protein-calorie malnutrition: Secondary | ICD-10-CM | POA: Diagnosis not present

## 2017-04-13 DIAGNOSIS — D696 Thrombocytopenia, unspecified: Secondary | ICD-10-CM | POA: Diagnosis not present

## 2017-04-13 DIAGNOSIS — Z87440 Personal history of urinary (tract) infections: Secondary | ICD-10-CM | POA: Diagnosis not present

## 2017-04-13 DIAGNOSIS — N189 Chronic kidney disease, unspecified: Secondary | ICD-10-CM | POA: Diagnosis not present

## 2017-04-15 DIAGNOSIS — Z87440 Personal history of urinary (tract) infections: Secondary | ICD-10-CM | POA: Diagnosis not present

## 2017-04-15 DIAGNOSIS — E43 Unspecified severe protein-calorie malnutrition: Secondary | ICD-10-CM | POA: Diagnosis not present

## 2017-04-15 DIAGNOSIS — K5900 Constipation, unspecified: Secondary | ICD-10-CM | POA: Diagnosis not present

## 2017-04-15 DIAGNOSIS — N189 Chronic kidney disease, unspecified: Secondary | ICD-10-CM | POA: Diagnosis not present

## 2017-04-15 DIAGNOSIS — I129 Hypertensive chronic kidney disease with stage 1 through stage 4 chronic kidney disease, or unspecified chronic kidney disease: Secondary | ICD-10-CM | POA: Diagnosis not present

## 2017-04-15 DIAGNOSIS — D696 Thrombocytopenia, unspecified: Secondary | ICD-10-CM | POA: Diagnosis not present

## 2017-04-19 DIAGNOSIS — N189 Chronic kidney disease, unspecified: Secondary | ICD-10-CM | POA: Diagnosis not present

## 2017-04-19 DIAGNOSIS — E43 Unspecified severe protein-calorie malnutrition: Secondary | ICD-10-CM | POA: Diagnosis not present

## 2017-04-19 DIAGNOSIS — Z87440 Personal history of urinary (tract) infections: Secondary | ICD-10-CM | POA: Diagnosis not present

## 2017-04-19 DIAGNOSIS — I129 Hypertensive chronic kidney disease with stage 1 through stage 4 chronic kidney disease, or unspecified chronic kidney disease: Secondary | ICD-10-CM | POA: Diagnosis not present

## 2017-04-19 DIAGNOSIS — D696 Thrombocytopenia, unspecified: Secondary | ICD-10-CM | POA: Diagnosis not present

## 2017-04-19 DIAGNOSIS — K5900 Constipation, unspecified: Secondary | ICD-10-CM | POA: Diagnosis not present

## 2017-04-20 DIAGNOSIS — D696 Thrombocytopenia, unspecified: Secondary | ICD-10-CM | POA: Diagnosis not present

## 2017-04-20 DIAGNOSIS — E43 Unspecified severe protein-calorie malnutrition: Secondary | ICD-10-CM | POA: Diagnosis not present

## 2017-04-20 DIAGNOSIS — I129 Hypertensive chronic kidney disease with stage 1 through stage 4 chronic kidney disease, or unspecified chronic kidney disease: Secondary | ICD-10-CM | POA: Diagnosis not present

## 2017-04-20 DIAGNOSIS — N189 Chronic kidney disease, unspecified: Secondary | ICD-10-CM | POA: Diagnosis not present

## 2017-04-20 DIAGNOSIS — K5900 Constipation, unspecified: Secondary | ICD-10-CM | POA: Diagnosis not present

## 2017-04-20 DIAGNOSIS — Z87440 Personal history of urinary (tract) infections: Secondary | ICD-10-CM | POA: Diagnosis not present

## 2017-04-22 DIAGNOSIS — N189 Chronic kidney disease, unspecified: Secondary | ICD-10-CM | POA: Diagnosis not present

## 2017-04-22 DIAGNOSIS — E43 Unspecified severe protein-calorie malnutrition: Secondary | ICD-10-CM | POA: Diagnosis not present

## 2017-04-22 DIAGNOSIS — D696 Thrombocytopenia, unspecified: Secondary | ICD-10-CM | POA: Diagnosis not present

## 2017-04-22 DIAGNOSIS — K5900 Constipation, unspecified: Secondary | ICD-10-CM | POA: Diagnosis not present

## 2017-04-22 DIAGNOSIS — I129 Hypertensive chronic kidney disease with stage 1 through stage 4 chronic kidney disease, or unspecified chronic kidney disease: Secondary | ICD-10-CM | POA: Diagnosis not present

## 2017-04-22 DIAGNOSIS — Z87440 Personal history of urinary (tract) infections: Secondary | ICD-10-CM | POA: Diagnosis not present

## 2017-07-08 ENCOUNTER — Emergency Department (HOSPITAL_COMMUNITY): Payer: Medicare Other

## 2017-07-08 ENCOUNTER — Encounter (HOSPITAL_COMMUNITY): Payer: Self-pay

## 2017-07-08 ENCOUNTER — Inpatient Hospital Stay (HOSPITAL_COMMUNITY)
Admission: EM | Admit: 2017-07-08 | Discharge: 2017-07-15 | DRG: 871 | Disposition: A | Discharge status: Skilled Nursing Facility | Payer: Medicare Other | Attending: Internal Medicine | Admitting: Internal Medicine

## 2017-07-08 DIAGNOSIS — Z8249 Family history of ischemic heart disease and other diseases of the circulatory system: Secondary | ICD-10-CM

## 2017-07-08 DIAGNOSIS — E43 Unspecified severe protein-calorie malnutrition: Secondary | ICD-10-CM | POA: Diagnosis not present

## 2017-07-08 DIAGNOSIS — Z66 Do not resuscitate: Secondary | ICD-10-CM | POA: Diagnosis present

## 2017-07-08 DIAGNOSIS — C946 Myelodysplastic disease, not classified: Secondary | ICD-10-CM | POA: Diagnosis present

## 2017-07-08 DIAGNOSIS — D72828 Other elevated white blood cell count: Secondary | ICD-10-CM

## 2017-07-08 DIAGNOSIS — N390 Urinary tract infection, site not specified: Secondary | ICD-10-CM | POA: Diagnosis present

## 2017-07-08 DIAGNOSIS — A419 Sepsis, unspecified organism: Secondary | ICD-10-CM | POA: Diagnosis not present

## 2017-07-08 DIAGNOSIS — F0390 Unspecified dementia without behavioral disturbance: Secondary | ICD-10-CM | POA: Diagnosis not present

## 2017-07-08 DIAGNOSIS — R262 Difficulty in walking, not elsewhere classified: Secondary | ICD-10-CM | POA: Diagnosis present

## 2017-07-08 DIAGNOSIS — I2510 Atherosclerotic heart disease of native coronary artery without angina pectoris: Secondary | ICD-10-CM | POA: Diagnosis present

## 2017-07-08 DIAGNOSIS — Z681 Body mass index (BMI) 19 or less, adult: Secondary | ICD-10-CM

## 2017-07-08 DIAGNOSIS — L89151 Pressure ulcer of sacral region, stage 1: Secondary | ICD-10-CM | POA: Diagnosis present

## 2017-07-08 DIAGNOSIS — M40209 Unspecified kyphosis, site unspecified: Secondary | ICD-10-CM | POA: Diagnosis not present

## 2017-07-08 DIAGNOSIS — N183 Chronic kidney disease, stage 3 unspecified: Secondary | ICD-10-CM | POA: Diagnosis present

## 2017-07-08 DIAGNOSIS — I872 Venous insufficiency (chronic) (peripheral): Secondary | ICD-10-CM | POA: Diagnosis present

## 2017-07-08 DIAGNOSIS — Z955 Presence of coronary angioplasty implant and graft: Secondary | ICD-10-CM | POA: Diagnosis not present

## 2017-07-08 DIAGNOSIS — D631 Anemia in chronic kidney disease: Secondary | ICD-10-CM | POA: Diagnosis present

## 2017-07-08 DIAGNOSIS — D509 Iron deficiency anemia, unspecified: Secondary | ICD-10-CM | POA: Diagnosis not present

## 2017-07-08 DIAGNOSIS — Z87440 Personal history of urinary (tract) infections: Secondary | ICD-10-CM

## 2017-07-08 DIAGNOSIS — J9811 Atelectasis: Secondary | ICD-10-CM | POA: Diagnosis not present

## 2017-07-08 DIAGNOSIS — F0280 Dementia in other diseases classified elsewhere without behavioral disturbance: Secondary | ICD-10-CM | POA: Diagnosis not present

## 2017-07-08 DIAGNOSIS — K8020 Calculus of gallbladder without cholecystitis without obstruction: Secondary | ICD-10-CM | POA: Diagnosis not present

## 2017-07-08 DIAGNOSIS — R410 Disorientation, unspecified: Secondary | ICD-10-CM

## 2017-07-08 DIAGNOSIS — R4182 Altered mental status, unspecified: Secondary | ICD-10-CM | POA: Diagnosis not present

## 2017-07-08 DIAGNOSIS — I10 Essential (primary) hypertension: Secondary | ICD-10-CM | POA: Diagnosis present

## 2017-07-08 DIAGNOSIS — R531 Weakness: Secondary | ICD-10-CM | POA: Diagnosis not present

## 2017-07-08 DIAGNOSIS — D638 Anemia in other chronic diseases classified elsewhere: Secondary | ICD-10-CM | POA: Diagnosis not present

## 2017-07-08 DIAGNOSIS — K5641 Fecal impaction: Secondary | ICD-10-CM | POA: Diagnosis present

## 2017-07-08 DIAGNOSIS — G308 Other Alzheimer's disease: Secondary | ICD-10-CM | POA: Diagnosis not present

## 2017-07-08 DIAGNOSIS — R7881 Bacteremia: Secondary | ICD-10-CM | POA: Diagnosis not present

## 2017-07-08 DIAGNOSIS — E785 Hyperlipidemia, unspecified: Secondary | ICD-10-CM | POA: Diagnosis present

## 2017-07-08 DIAGNOSIS — I878 Other specified disorders of veins: Secondary | ICD-10-CM | POA: Diagnosis present

## 2017-07-08 DIAGNOSIS — R404 Transient alteration of awareness: Secondary | ICD-10-CM | POA: Diagnosis not present

## 2017-07-08 DIAGNOSIS — I129 Hypertensive chronic kidney disease with stage 1 through stage 4 chronic kidney disease, or unspecified chronic kidney disease: Secondary | ICD-10-CM | POA: Diagnosis present

## 2017-07-08 DIAGNOSIS — D72829 Elevated white blood cell count, unspecified: Secondary | ICD-10-CM

## 2017-07-08 DIAGNOSIS — Z79899 Other long term (current) drug therapy: Secondary | ICD-10-CM | POA: Diagnosis not present

## 2017-07-08 DIAGNOSIS — M6281 Muscle weakness (generalized): Secondary | ICD-10-CM | POA: Diagnosis not present

## 2017-07-08 DIAGNOSIS — L8990 Pressure ulcer of unspecified site, unspecified stage: Secondary | ICD-10-CM | POA: Diagnosis present

## 2017-07-08 DIAGNOSIS — R8279 Other abnormal findings on microbiological examination of urine: Secondary | ICD-10-CM | POA: Diagnosis not present

## 2017-07-08 DIAGNOSIS — N3001 Acute cystitis with hematuria: Secondary | ICD-10-CM | POA: Diagnosis not present

## 2017-07-08 LAB — COMPREHENSIVE METABOLIC PANEL
ALT: 12 U/L — ABNORMAL LOW (ref 14–54)
AST: 37 U/L (ref 15–41)
Albumin: 3.8 g/dL (ref 3.5–5.0)
Alkaline Phosphatase: 97 U/L (ref 38–126)
Anion gap: 11 (ref 5–15)
BUN: 15 mg/dL (ref 6–20)
CO2: 23 mmol/L (ref 22–32)
Calcium: 8.9 mg/dL (ref 8.9–10.3)
Chloride: 105 mmol/L (ref 101–111)
Creatinine, Ser: 1.09 mg/dL — ABNORMAL HIGH (ref 0.44–1.00)
GFR calc Af Amer: 56 mL/min — ABNORMAL LOW (ref >60)
GFR calc non Af Amer: 48 mL/min — ABNORMAL LOW (ref >60)
Glucose, Bld: 104 mg/dL — ABNORMAL HIGH (ref 65–99)
Potassium: 3.6 mmol/L (ref 3.5–5.1)
Sodium: 139 mmol/L (ref 135–145)
Total Bilirubin: 0.8 mg/dL (ref 0.3–1.2)
Total Protein: 8.7 g/dL — ABNORMAL HIGH (ref 6.5–8.1)

## 2017-07-08 LAB — URINALYSIS, ROUTINE W REFLEX MICROSCOPIC
Bilirubin Urine: NEGATIVE
Glucose, UA: NEGATIVE mg/dL
Ketones, ur: NEGATIVE mg/dL
Nitrite: NEGATIVE
Protein, ur: NEGATIVE mg/dL
Specific Gravity, Urine: 1.009 (ref 1.005–1.030)
pH: 7 (ref 5.0–8.0)

## 2017-07-08 LAB — CBG MONITORING, ED: Glucose-Capillary: 90 mg/dL (ref 65–99)

## 2017-07-08 LAB — CBC WITH DIFFERENTIAL/PLATELET
Basophils Absolute: 0 10*3/uL (ref 0.0–0.1)
Basophils Relative: 0 %
Eosinophils Absolute: 0.3 10*3/uL (ref 0.0–0.7)
Eosinophils Relative: 1 %
HCT: 28.3 % — ABNORMAL LOW (ref 36.0–46.0)
Hemoglobin: 8.9 g/dL — ABNORMAL LOW (ref 12.0–15.0)
Lymphocytes Relative: 12 %
Lymphs Abs: 3.3 10*3/uL (ref 0.7–4.0)
MCH: 23.2 pg — ABNORMAL LOW (ref 26.0–34.0)
MCHC: 31.4 g/dL (ref 30.0–36.0)
MCV: 73.7 fL — ABNORMAL LOW (ref 78.0–100.0)
Metamyelocytes Relative: 2 %
Monocytes Absolute: 2.2 10*3/uL — ABNORMAL HIGH (ref 0.1–1.0)
Monocytes Relative: 8 %
Myelocytes: 12 %
Neutro Abs: 21.1 10*3/uL — ABNORMAL HIGH (ref 1.7–7.7)
Neutrophils Relative %: 62 %
Other: 3 %
Platelets: 695 10*3/uL — ABNORMAL HIGH (ref 150–400)
RBC: 3.84 MIL/uL — ABNORMAL LOW (ref 3.87–5.11)
RDW: 32.8 % — ABNORMAL HIGH (ref 11.5–15.5)
WBC: 27.7 10*3/uL — ABNORMAL HIGH (ref 4.0–10.5)
nRBC: 7 /100 WBC — ABNORMAL HIGH

## 2017-07-08 LAB — I-STAT CG4 LACTIC ACID, ED: Lactic Acid, Venous: 1.87 mmol/L (ref 0.5–1.9)

## 2017-07-08 LAB — PROTIME-INR
INR: 1.14
Prothrombin Time: 14.5 seconds (ref 11.4–15.2)

## 2017-07-08 MED ORDER — SODIUM CHLORIDE 0.9 % IV BOLUS (SEPSIS)
1000.0000 mL | Freq: Once | INTRAVENOUS | Status: AC
  Administered 2017-07-08: 1000 mL via INTRAVENOUS

## 2017-07-08 MED ORDER — ENOXAPARIN SODIUM 40 MG/0.4ML ~~LOC~~ SOLN
40.0000 mg | SUBCUTANEOUS | Status: DC
  Administered 2017-07-08: 40 mg via SUBCUTANEOUS
  Filled 2017-07-08: qty 0.4

## 2017-07-08 MED ORDER — DEXTROSE 5 % IV SOLN
1.0000 g | INTRAVENOUS | Status: DC
  Administered 2017-07-09 – 2017-07-11 (×3): 1 g via INTRAVENOUS
  Filled 2017-07-08 (×3): qty 10

## 2017-07-08 MED ORDER — ACETAMINOPHEN 650 MG RE SUPP: 650.0000 mg | Freq: Four times a day (QID) | RECTAL | Status: DC | PRN

## 2017-07-08 MED ORDER — ACETAMINOPHEN 325 MG PO TABS: 650.0000 mg | ORAL_TABLET | Freq: Four times a day (QID) | ORAL | Status: DC | PRN

## 2017-07-08 MED ORDER — POTASSIUM CHLORIDE IN NACL 20-0.9 MEQ/L-% IV SOLN
INTRAVENOUS | Status: DC
  Administered 2017-07-08: 23:00:00 via INTRAVENOUS
  Filled 2017-07-08 (×2): qty 1000

## 2017-07-08 MED ORDER — ONDANSETRON HCL 4 MG/2ML IJ SOLN: 4.0000 mg | Freq: Four times a day (QID) | INTRAMUSCULAR | Status: DC | PRN

## 2017-07-08 MED ORDER — POLYETHYLENE GLYCOL 3350 17 G PO PACK
17.0000 g | PACK | Freq: Every day | ORAL | Status: DC
  Administered 2017-07-09 – 2017-07-14 (×5): 17 g via ORAL
  Filled 2017-07-08 (×5): qty 1

## 2017-07-08 MED ORDER — ATORVASTATIN CALCIUM 10 MG PO TABS
10.0000 mg | ORAL_TABLET | Freq: Every day | ORAL | Status: DC
  Administered 2017-07-09 – 2017-07-15 (×7): 10 mg via ORAL
  Filled 2017-07-08 (×7): qty 1

## 2017-07-08 MED ORDER — CEFTRIAXONE SODIUM 1 G IJ SOLR
1.0000 g | Freq: Once | INTRAMUSCULAR | Status: AC
  Administered 2017-07-08: 1 g via INTRAVENOUS
  Filled 2017-07-08: qty 10

## 2017-07-08 MED ORDER — DOCUSATE SODIUM 100 MG PO CAPS
100.0000 mg | ORAL_CAPSULE | Freq: Two times a day (BID) | ORAL | Status: DC
  Administered 2017-07-08 – 2017-07-12 (×9): 100 mg via ORAL
  Filled 2017-07-08 (×9): qty 1

## 2017-07-08 MED ORDER — ONDANSETRON HCL 4 MG PO TABS: 4.0000 mg | ORAL_TABLET | Freq: Four times a day (QID) | ORAL | Status: DC | PRN

## 2017-07-08 NOTE — H&P (Signed)
History and Physical    Ashlee Mack HWE:[id number] DOB: March 18, 1941 DOA: 07/08/2017  PCP: Arnoldo Morale, MD   Patient coming from: Home  Chief Complaint: altered mental status  HPI: Ashlee Mack is a 76 y.o. female with medical history significant for- HTN, coronary artery disease, chronic stasis dermatitis, recurrent urinary tract infection, dementia. Patient was brought to the ED today with her spouse due to complaints of weakness, lethargy. Baseline patient is able to ambulate with a walker,but has not being able to do so for 2 days. Also notes some confusion over the past 2 days Patient denies abdominal pain, dysuria, enies flank pain or fever, but Spouse notes chills about 5 days ago. Patient denies difficulty breathing or cough, no chest pain, no diarrhea or vomiting, no photophobia or neck Stiffness or headache.  ED Course: temperature 98.2, mildly tachycardic up to 109, unremarkable BMP, WBC elevated at 27, mild elevated platelets 695, hemoglobin close to baseline at 8.9.chest x-ray 2 view- acute cardiopulmonary process, head CT mild chronic ischemic microvascular disease, small right central lacunar infarct. Lactic acid was unremarkable- uA showed large leukocytes. Patient was started on IV ceftriaxone in the ED, blood cultures were drawn.   Review of Systems: As per HPI otherwise 10 point review of systems negative.  Past Medical History:  Diagnosis Date  . CKD (chronic kidney disease) stage 3, GFR 30-59 ml/min (HCC) 07/31/2015  . Coronary artery disease    Patient denies  . Hyperlipidemia   . Hypertension     Past Surgical History:  Procedure Laterality Date  . CORONARY ANGIOPLASTY WITH STENT PLACEMENT       reports that she has never smoked. She has never used smokeless tobacco. She reports that she does not drink alcohol or use drugs.  No Known Allergies  Family History  Problem Relation Age of Onset  . Hypertension Mother   . Hypertension Father     Prior to  Admission medications   Medication Sig Start Date End Date Taking? Authorizing Provider  amLODipine (NORVASC) 10 MG tablet Take 1 tablet (10 mg total) by mouth daily. 10/18/16  Yes Arnoldo Morale, MD  atorvastatin (LIPITOR) 10 MG tablet Take 1 tablet (10 mg total) by mouth daily. 10/18/16  Yes Arnoldo Morale, MD  docusate sodium (COLACE) 100 MG capsule Take 1 capsule (100 mg total) by mouth 2 (two) times daily. Patient not taking: Reported on 07/08/2017 03/02/17   Rosita Fire, MD  feeding supplement (BOOST / RESOURCE BREEZE) LIQD Take 1 Container by mouth 2 (two) times daily between meals. Patient not taking: Reported on 07/08/2017 01/05/16   Debbe Odea, MD  polyethylene glycol (MIRALAX / GLYCOLAX) packet Take 17 g by mouth daily. Patient not taking: Reported on 07/08/2017 03/03/17   Rosita Fire, MD    Physical Exam: Vitals:   07/08/17 1901 07/08/17 1930 07/08/17 2000 07/08/17 2030  BP: 125/83 128/74 122/75 124/78  Pulse: (!) 107  (!) 103 (!) 104  Resp: 17 (!) 24 (!) 23 (!) 24  Temp:      TempSrc:      SpO2: 96%     Weight:      Height:        Constitutional: NAD, calm, comfortable, frail-appearing Vitals:   07/08/17 1901 07/08/17 1930 07/08/17 2000 07/08/17 2030  BP: 125/83 128/74 122/75 124/78  Pulse: (!) 107  (!) 103 (!) 104  Resp: 17 (!) 24 (!) 23 (!) 24  Temp:      TempSrc:  SpO2: 96%     Weight:      Height:       Eyes: PERRL, lids and conjunctivae normal ENMT: Mucous membranes are moist. Posterior pharynx clear of any exudate or lesions.Normal dentition.  Neck: normal, supple, no masses, no thyromegaly Respiratory: clear to auscultation bilaterally, no wheezing, no crackles. Normal respiratory effort. No accessory muscle use.  Cardiovascular: Regular rate and rhythm, no murmurs / rubs / gallops. No extremity edema. 2+ pedal pulses.   Abdomen: suprapubic tenderness, no masses palpated. No hepatosplenomegaly. Bowel sounds positive. No CVA  tenderness Musculoskeletal: no clubbing / cyanosis. No joint deformity upper and lower extremities. Good ROM, no contractures. Normal muscle tone.  Skin: Significant stasis changes with hyperpigmentation significantly more on right lower extremity- but normal for patient Neurologic: CN 2-12 grossly intact. Strength 4+/5 in all 4.  Psychiatric: Normal judgment and insight. Alert and oriented x 3. Normal mood.   Labs on Admission: I have personally reviewed following labs and imaging studies  CBC:  Recent Labs Lab 07/08/17 1553  WBC 27.7*  NEUTROABS 21.1*  HGB 8.9*  HCT 28.3*  MCV 73.7*  PLT 510*   Basic Metabolic Panel:  Recent Labs Lab 07/08/17 1553  NA 139  K 3.6  CL 105  CO2 23  GLUCOSE 104*  BUN 15  CREATININE 1.09*  CALCIUM 8.9   Liver Function Tests:  Recent Labs Lab 07/08/17 1553  AST 37  ALT 12*  ALKPHOS 97  BILITOT 0.8  PROT 8.7*  ALBUMIN 3.8   Coagulation Profile:  Recent Labs Lab 07/08/17 1553  INR 1.14   CBG:  Recent Labs Lab 07/08/17 1633  GLUCAP 90   Urine analysis:    Component Value Date/Time   COLORURINE YELLOW 07/08/2017 1840   APPEARANCEUR CLEAR 07/08/2017 1840   LABSPEC 1.009 07/08/2017 1840   PHURINE 7.0 07/08/2017 1840   GLUCOSEU NEGATIVE 07/08/2017 1840   HGBUR MODERATE (A) 07/08/2017 1840   BILIRUBINUR NEGATIVE 07/08/2017 1840   BILIRUBINUR neg 09/11/2014 1546   [place] 07/08/2017 1840   PROTEINUR NEGATIVE 07/08/2017 1840   UROBILINOGEN 1.0 02/09/2015 2051   NITRITE NEGATIVE 07/08/2017 1840   LEUKOCYTESUR LARGE (A) 07/08/2017 1840    Radiological Exams on Admission: Dg Chest 2 View  Result Date: 07/08/2017 CLINICAL DATA:  Patient with history of urinary tract infection. EXAM: CHEST  2 VIEW COMPARISON:  Chest radiograph 02/19/2017. FINDINGS: Patient is rotated to the left. Stable cardiac and mediastinal contours. Elevation right hemidiaphragm. No large area of pulmonary consolidation. Left basilar  atelectasis. No pleural effusion or pneumothorax. Thoracic spine degenerative changes. IMPRESSION: No acute cardiopulmonary process. Electronically Signed   By: Lovey Newcomer M.D.   On: 07/08/2017 16:40   Ct Head Wo Contrast  Result Date: 07/08/2017 CLINICAL DATA:  Worsening confusion over the past 2 days with inability to ambulate over the past couple days. EXAM: CT HEAD WITHOUT CONTRAST TECHNIQUE: Contiguous axial images were obtained from the base of the skull through the vertex without intravenous contrast. COMPARISON:  02/19/2017 FINDINGS: Brain: Ventricles, cisterns and other CSF spaces are within normal as there is minimal age related atrophic change. There is mild chronic ischemic microvascular disease. There is no mass, mass effect, shift of midline structures or acute hemorrhage. No evidence of acute infarction. Small central right sided lacune infarct unchanged per Vascular: No hyperdense vessel or unexpected calcification. Skull: Normal. Negative for fracture or focal lesion. Sinuses/Orbits: No acute finding. Other: None. IMPRESSION: No acute intracranial findings. Mild chronic ischemic  microvascular disease and mild age related atrophic change. 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 Electronically Signed   By: Marin Olp M.D.   On: 07/08/2017 17:38   EKG: Independently reviewed.   Assessment/Plan Principal Problem:   Sepsis secondary to UTI [facility]) Active Problems:   Chronic venous stasis dermatitis of both lower extremities   Anemia, iron deficiency   Severe protein-calorie malnutrition (HCC)   Essential hypertension   CKD (chronic kidney disease) stage 3, GFR 30-59 ml/min (HCC)   Dementia   Sepsis secondary to UTI- marked elevated WBC at 27, tachycardia, no fever. Patient meets sepsis criteria. No lactic acidosis- 1.87. UA- large leukocytes. Prior UTIs- Urine culture 02/2017- sensitive to cephalosporins and Levaquin. - continue IV ceftriaxone started in the ED - Follow up urine  cultures and blood cultures drawn in ED  Severe protein calorie malnutrition - nutrition consult - continue home feeding supplement  Thrombocytosis- platelets 695, chronically elevated. Hemoglobin low at 9.3- likely anemia of chronic disease,as reflected by 02/2017- ferritin elevated at 858, with low TIBC and normal serum iron.WBC mostly chronically elevated.  - possible myeloproliferative disease, related to follow-up as outpatient. - not on antiplatelet agent at home  Hypertension- stable blood pressure, home medications Norvasc 10 daily - Hold antihypertensives for now in the setting ofsepsis  DVT prophylaxis: lovenox Code Status: DO NOT RESUSCITATE: confirmed with patient and significant other at bedside Family Communication: significant other at bedside Disposition Plan:  To be determined Consults called: none Admission status: inpatient telemetry   Bethena Roys MD [facility] Pager [phone]  If 2AM-7AM, please contact night-coverage [email or web address] Password [email or web address]  07/08/2017, 9:14 PM

## 2017-07-08 NOTE — Progress Notes (Signed)
Please call report to [facility] at [phone]

## 2017-07-08 NOTE — ED Notes (Signed)
Attempted in and out cath for urine specimen. Unable to obtain enough specimen for lab collection. About  1 cc or yellowish pus obtained. PA bowie made aware. Pure wick in place .

## 2017-07-08 NOTE — ED Triage Notes (Signed)
Patient BIB EMS from home. Patient's son states to EMS that patient has been "more confused than usual over the past 48 hours." Patient smells strongly of ammonia in triage. Patient's son states the patient is usually independent and ambulates on her own and has not been able to do such over the past 2 days.

## 2017-07-08 NOTE — ED Notes (Signed)
Bed: WA09 Expected date:  Expected time:  Means of arrival:  Comments: EMS- elderly, AMS/possible UTI

## 2017-07-08 NOTE — ED Notes (Signed)
Floor Unit RN and Floor Charge RN unable to receive report on pt at this time.

## 2017-07-08 NOTE — ED Provider Notes (Signed)
[facility] DEPT Provider Note   CSN: [id number] Arrival date & time: 07/08/17  1517     History   Chief Complaint Chief Complaint  Patient presents with  . Altered Mental Status    HPI Ashlee Mack is a 76 y.o. female.  HPI   76 year old female with history of chronic kidney disease, CAD, hypertension, hyperlipidemia brought here via EMS from home for evaluation of altered mental status.  History obtained from husband who is at bedside.  Husband is a poor historian.  Husband states patient is mostly non-mobile, spending the majority of the time on her recliner.  She would use a wheelchair or walker only on occasion.  But for the past 48 hours, patient appears to be weaker, more confused than usual.  She does not complain of any discomfort or pain.  No report of cough.  She wears an adult diaper and has been usually change it twice daily.  As was the patient eats very minimal, that is not unusual for her.  Patient currently denies having any active pain such as headache, chest pain, trouble breathing, abdominal pain, trouble urinating.  Past Medical History:  Diagnosis Date  . CKD (chronic kidney disease) stage 3, GFR 30-59 ml/min (HCC) 07/31/2015  . Coronary artery disease    Patient denies  . Hyperlipidemia   . Hypertension     Patient Active Problem List   Diagnosis Date Noted  . 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 Fecal impaction in rectum ([place]) 02/27/2017  . Incontinence of bowel 10/18/2016  . Hyperlipidemia 10/18/2016  . Dementia 10/18/2016  . Pressure injury of skin 07/01/2016  . Dehydration   . Weakness generalized 06/30/2016  . Weakness 06/30/2016  . Acute cystitis without hematuria   . Anemia of chronic disease 01/03/2016  . Pre-diabetes 08/13/2015  . Bradycardia 08/03/2015  . Orthostasis 08/03/2015  . Syncope 08/02/2015  . Chronic kidney disease 08/02/2015  . Lower urinary tract infection   . Generalized weakness 07/31/2015  . CKD  (chronic kidney disease) stage 3, GFR 30-59 ml/min (HCC) 07/31/2015  . Chronic anemia 07/31/2015  . Pressure ulcer 02/12/2015  . Acute pyelonephritis 02/10/2015  . Constipation 02/10/2015  . Lactic acidosis 02/10/2015  . Acute encephalopathy 02/10/2015  . Malnutrition of moderate degree ([place]) 02/10/2015  . Severe sepsis ([place])   . Sepsis secondary to UTI ([place])   . Poor appetite 12/10/2014  . Acute renal failure syndrome ([place])   . Acute on chronic renal failure ([place]) 08/27/2014  . Essential hypertension 08/27/2014  . Lower urinary tract infectious disease 08/26/2014  . SIRS (systemic inflammatory response syndrome) ([place]) 08/26/2014  . Acute renal failure ([place]) 08/26/2014  . Nausea and vomiting 08/26/2014  . Diarrhea 08/26/2014  . Coronary artery disease   . Malignant hypertension 03/23/2014  . Uncontrolled hypertension 03/23/2014  . 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 Hypokalemia 03/23/2014  . Anemia, iron deficiency 03/23/2014  . Severe protein-calorie malnutrition ([place]) 03/23/2014  . Hypertension 03/22/2014    Past Surgical History:  Procedure Laterality Date  . CORONARY ANGIOPLASTY WITH STENT PLACEMENT      OB History    No data available       Home Medications    Prior to Admission medications   Medication Sig Start Date End Date Taking? Authorizing Provider  amLODipine (NORVASC) 10 MG tablet Take 1 tablet (10 mg total) by mouth daily. 10/18/16   Arnoldo Morale, MD  atorvastatin (LIPITOR) 10 MG tablet Take 1 tablet (  10 mg total) by mouth daily. 10/18/16   Arnoldo Morale, MD  docusate sodium (COLACE) 100 MG capsule Take 1 capsule (100 mg total) by mouth 2 (two) times daily. 03/02/17   Rosita Fire, MD  feeding supplement (BOOST / RESOURCE BREEZE) LIQD Take 1 Container by mouth 2 (two) times daily between meals. 01/05/16   Debbe Odea, MD  polyethylene glycol (MIRALAX / GLYCOLAX) packet Take 17 g by mouth daily. 03/03/17    Rosita Fire, MD    Family History Family History  Problem Relation Age of Onset  . Hypertension Mother   . Hypertension Father     Social History Social History  Substance Use Topics  . Smoking status: Never Smoker  . Smokeless tobacco: Never Used  . Alcohol use No     Allergies   Patient has no known allergies.   Review of Systems Review of Systems  Unable to perform ROS: Mental status change     Physical Exam Updated Vital Signs BP 132/73 (BP Location: Right Arm)   Pulse (!) 109   Temp 98.8 F (37.1 C) (Rectal)   Resp 20   Ht 5\' 2"  (1.575 m)   Wt 49.9 kg (110 lb)   SpO2 98%   BMI 20.12 kg/m   Physical Exam  Constitutional: She appears well-developed and well-nourished. No distress.  Elderly cachectic appearing female lying in bed  HENT:  Head: Atraumatic.  Mouth is dry  Eyes: Conjunctivae are normal.  Neck: Normal range of motion. Neck supple.  No nuchal rigidity  Cardiovascular:  Tachycardia without murmur rubs or gallops  Pulmonary/Chest:  Shallow breathing without overt wheezes, rales, rhonchi  Abdominal: Soft. Bowel sounds are normal. She exhibits no distension. There is no tenderness.  Genitourinary:  Genitourinary Comments: Stage I sacral decubitus ulcer  Musculoskeletal:  4 out of 5 strength all 4 extremities  Neurological: She is alert. No cranial nerve deficit or sensory deficit. GCS eye subscore is 4. GCS verbal subscore is 5. GCS motor subscore is 6.  Alert to self, knows that she is in the hospital but does not know why she is here.  Had the month and year wrong.    Skin: No rash noted.  Psychiatric: She has a normal mood and affect.  Nursing note and vitals reviewed.    ED Treatments / Results  Labs (all labs ordered are listed, but only abnormal results are displayed) Labs Reviewed  COMPREHENSIVE METABOLIC PANEL - Abnormal; Notable for the following:       Result Value   Glucose, Bld 104 (*)    Creatinine, Ser 1.09  (*)    Total Protein 8.7 (*)    ALT 12 (*)    GFR calc non Af Amer 48 (*)    GFR calc Af Amer 56 (*)    All other components within normal limits  CBC WITH DIFFERENTIAL/PLATELET - Abnormal; Notable for the following:    WBC 27.7 (*)    RBC 3.84 (*)    Hemoglobin 8.9 (*)    HCT 28.3 (*)    MCV 73.7 (*)    MCH 23.2 (*)    RDW 32.8 (*)    Platelets 695 (*)    nRBC 7 (*)    Neutro Abs 21.1 (*)    Monocytes Absolute 2.2 (*)    All other components within normal limits  URINALYSIS, ROUTINE W REFLEX MICROSCOPIC - Abnormal; Notable for the following:    Hgb urine dipstick MODERATE (*)  Leukocytes, UA LARGE (*)    Bacteria, UA RARE (*)    Squamous Epithelial / LPF 0-5 (*)    All other components within normal limits  CULTURE, BLOOD (ROUTINE X 2)  CULTURE, BLOOD (ROUTINE X 2)  URINE CULTURE  PROTIME-INR  PATHOLOGIST SMEAR REVIEW  I-STAT CG4 LACTIC ACID, ED  CBG MONITORING, ED    EKG  EKG Interpretation None     ED ECG REPORT   Date: 07/08/2017  Rate: 105  Rhythm: sinus tachycardia  QRS Axis: normal  Intervals: normal  ST/T Wave abnormalities: nonspecific ST/T changes  Conduction Disutrbances:none  Narrative Interpretation:   Old EKG Reviewed: unchanged  I have personally reviewed the EKG tracing and agree with the computerized printout as noted.   Radiology Dg Chest 2 View  Result Date: 07/08/2017 CLINICAL DATA:  Patient with history of urinary tract infection. EXAM: CHEST  2 VIEW COMPARISON:  Chest radiograph 02/19/2017. FINDINGS: Patient is rotated to the left. Stable cardiac and mediastinal contours. Elevation right hemidiaphragm. No large area of pulmonary consolidation. Left basilar atelectasis. No pleural effusion or pneumothorax. Thoracic spine degenerative changes. IMPRESSION: No acute cardiopulmonary process. Electronically Signed   By: Lovey Newcomer M.D.   On: 07/08/2017 16:40   Ct Head Wo Contrast  Result Date: 07/08/2017 CLINICAL DATA:  Worsening  confusion over the past 2 days with inability to ambulate over the past couple days. EXAM: CT HEAD WITHOUT CONTRAST TECHNIQUE: Contiguous axial images were obtained from the base of the skull through the vertex without intravenous contrast. COMPARISON:  02/19/2017 FINDINGS: Brain: Ventricles, cisterns and other CSF spaces are within normal as there is minimal age related atrophic change. There is mild chronic ischemic microvascular disease. There is no mass, mass effect, shift of midline structures or acute hemorrhage. No evidence of acute infarction. Small central right sided lacune infarct unchanged per Vascular: No hyperdense vessel or unexpected calcification. Skull: Normal. Negative for fracture or focal lesion. Sinuses/Orbits: No acute finding. Other: None. IMPRESSION: No acute intracranial findings. Mild chronic ischemic microvascular disease and mild age related atrophic change. 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 Electronically Signed   By: Marin Olp M.D.   On: 07/08/2017 17:38    Procedures Procedures (including critical care time)  Medications Ordered in ED Medications  cefTRIAXone (ROCEPHIN) 1 g in dextrose 5 % 50 mL IVPB (1 g Intravenous New Bag/Given 07/08/17 1940)  sodium chloride 0.9 % bolus 1,000 mL (0 mLs Intravenous Stopped 07/08/17 1940)     Initial Impression / Assessment and Plan / ED Course  I have reviewed the triage vital signs and the nursing notes.  Pertinent labs & imaging results that were available during my care of the patient were reviewed by me and considered in my medical decision making (see chart for details).     BP 128/74   Pulse (!) 107   Temp 98.8 F (37.1 C) (Rectal)   Resp (!) 24   Ht 5\' 2"  (1.575 m)   Wt 49.9 kg (110 lb)   SpO2 96%   BMI 20.12 kg/m    Final Clinical Impressions(s) / ED Diagnoses   Final diagnoses:  Delirium  Acute cystitis with hematuria  Other elevated white blood cell (WBC) count    New Prescriptions New  Prescriptions   No medications on file   4:17 PM Patient here for altered mental status as well as generalized weakness.  She is mostly non-mobile.  Nurse attempted to obtain urine but unable to advance in and  out [place].  Pt appears dehydrated.  Concern for underlying UTI.  Work up initiated, ivf given.   8:05 PM Urine with finding concerning for UTI.  Will give Rocephin IV antibiotic.  Labs otherwise reassuring, normal lactic acid.  Her white count is elevated at 27.7.  Her INR is subtherapeutic.  CXR unremarkable.  Head CT scan without acute intracranial changes.  Patient still tachycardic, will continue with IV hydration and will consult for admission.  Care discussed with DR. Kohut.  8:32 PM Appreciate consultation from [facility] Dr. Denton Brick who agrees to see and admit pt for further care.      Domenic Moras, PA-C 07/08/17 2032    Virgel Manifold, MD 07/12/17 [phone]

## 2017-07-09 DIAGNOSIS — N3001 Acute cystitis with hematuria: Secondary | ICD-10-CM

## 2017-07-09 DIAGNOSIS — D72828 Other elevated white blood cell count: Secondary | ICD-10-CM

## 2017-07-09 DIAGNOSIS — F0280 Dementia in other diseases classified elsewhere without behavioral disturbance: Secondary | ICD-10-CM

## 2017-07-09 DIAGNOSIS — G308 Other Alzheimer's disease: Secondary | ICD-10-CM

## 2017-07-09 DIAGNOSIS — E43 Unspecified severe protein-calorie malnutrition: Secondary | ICD-10-CM

## 2017-07-09 DIAGNOSIS — I10 Essential (primary) hypertension: Secondary | ICD-10-CM

## 2017-07-09 DIAGNOSIS — R410 Disorientation, unspecified: Secondary | ICD-10-CM

## 2017-07-09 LAB — BASIC METABOLIC PANEL
Anion gap: 11 (ref 5–15)
BUN: 11 mg/dL (ref 6–20)
CALCIUM: 8.5 mg/dL — AB (ref 8.9–10.3)
CHLORIDE: 108 mmol/L (ref 101–111)
CO2: 20 mmol/L — AB (ref 22–32)
CREATININE: 0.96 mg/dL (ref 0.44–1.00)
GFR calc non Af Amer: 56 mL/min — ABNORMAL LOW (ref >60)
GLUCOSE: 90 mg/dL (ref 65–99)
Potassium: 3.7 mmol/L (ref 3.5–5.1)
Sodium: 139 mmol/L (ref 135–145)

## 2017-07-09 LAB — FERRITIN: FERRITIN: 883 ng/mL — AB (ref 11–307)

## 2017-07-09 LAB — CBC
HEMATOCRIT: 33.6 % — AB (ref 36.0–46.0)
HEMOGLOBIN: 10.8 g/dL — AB (ref 12.0–15.0)
MCH: 23.7 pg — AB (ref 26.0–34.0)
MCHC: 32.1 g/dL (ref 30.0–36.0)
MCV: 73.7 fL — AB (ref 78.0–100.0)
PLATELETS: 485 10*3/uL — AB (ref 150–400)
RBC: 4.56 MIL/uL (ref 3.87–5.11)
RDW: 32 % — ABNORMAL HIGH (ref 11.5–15.5)
WBC: 17.1 10*3/uL — AB (ref 4.0–10.5)

## 2017-07-09 LAB — VITAMIN B12: Vitamin B-12: 443 pg/mL (ref 180–914)

## 2017-07-09 LAB — IRON AND TIBC
IRON: 41 ug/dL (ref 28–170)
Saturation Ratios: 22 % (ref 10.4–31.8)
TIBC: 185 ug/dL — ABNORMAL LOW (ref 250–450)
UIBC: 144 ug/dL

## 2017-07-09 LAB — FOLATE: FOLATE: 13.3 ng/mL (ref >5.9)

## 2017-07-09 MED ORDER — FLUCONAZOLE 100 MG PO TABS
200.0000 mg | ORAL_TABLET | Freq: Once | ORAL | Status: AC
  Administered 2017-07-09: 200 mg via ORAL
  Filled 2017-07-09: qty 2

## 2017-07-09 MED ORDER — ENOXAPARIN SODIUM 30 MG/0.3ML ~~LOC~~ SOLN
30.0000 mg | SUBCUTANEOUS | Status: DC
  Administered 2017-07-09 – 2017-07-14 (×6): 30 mg via SUBCUTANEOUS
  Filled 2017-07-09 (×6): qty 0.3

## 2017-07-09 MED ORDER — BOOST / RESOURCE BREEZE PO LIQD
1.0000 | Freq: Two times a day (BID) | ORAL | Status: DC
  Administered 2017-07-09 – 2017-07-15 (×13): 1 via ORAL

## 2017-07-09 MED ORDER — FLUCONAZOLE 100 MG PO TABS
100.0000 mg | ORAL_TABLET | Freq: Every day | ORAL | Status: DC
  Administered 2017-07-10 – 2017-07-11 (×2): 100 mg via ORAL
  Filled 2017-07-09 (×2): qty 1

## 2017-07-09 MED ORDER — FLUCONAZOLE 100 MG PO TABS: 200.0000 mg | ORAL_TABLET | Freq: Every day | ORAL | Status: DC

## 2017-07-09 MED ORDER — LACTATED RINGERS IV SOLN
INTRAVENOUS | Status: DC
  Administered 2017-07-09 – 2017-07-12 (×5): via INTRAVENOUS

## 2017-07-09 NOTE — Evaluation (Signed)
Occupational Therapy Evaluation Patient Details Name: Ashlee Mack MRN: [id number] DOB: [birth year] Today's Date: 07/09/2017    History of Present Illness This 76 y.o. female admitted with weakness, and lethargy.  Dx:  Sepsis due to UTI.  PMH includes Dementia, recurrent UTI, severe protein -calorie malnutrition, chronic vensous stasis; essential HTN, CKD III   Clinical Impression   Pt admitted with above. She demonstrates the below listed deficits and will benefit from continued OT to maximize safety and independence with BADLs.  Pt presents to OT with generalized weakness, decreased activity tolerance, impaired cognition, decreased balance.  Prior to admission, pt was residing at home with spouse who provided 24 hour assistance.  Spouse reports pt stayed in recliner all day and night, wears diapers, and he stands her 2x/day to change diapers and provide peri care.  He reports she hasn't walked "in a long time", and has become progressively weaker.  She currently is able to assist with self feeding, but requires total A for all ADLs and max A to move sit to partial stand (unable to fully stand). Spouse feels her care needs are exceeding what he is able to provide and feels she needs SNF at this time.        Follow Up Recommendations  SNF    Equipment Recommendations  None recommended by OT    Recommendations for Other Services       Precautions / Restrictions Precautions Precautions: Fall      Mobility Bed Mobility Overal bed mobility: Needs Assistance Bed Mobility: Rolling;Sidelying to Sit;Sit to Sidelying Rolling: Max assist Sidelying to sit: Max assist     Sit to sidelying: Max assist General bed mobility comments: Pt assists minimally with all movements   Transfers Overall transfer level: Needs assistance   Transfers: Sit to/from Stand Sit to Stand: Max assist         General transfer comment: Pt moved sit to partial stand x 2 with max A.  Unable to come into full  standing     Balance Overall balance assessment: Needs assistance Sitting-balance support: Feet supported Sitting balance-Leahy Scale: Fair     Standing balance support: Bilateral upper extremity supported Standing balance-Leahy Scale: Zero                             ADL either performed or assessed with clinical judgement   ADL Overall ADL's : Needs assistance/impaired Eating/Feeding: Set up;Bed level   Grooming: Wash/dry hands;Wash/dry face;Oral care;Brushing hair;Total assistance;Sitting;Bed level   Upper Body Bathing: Total assistance;Bed level;Sitting   Lower Body Bathing: Total assistance;Bed level   Upper Body Dressing : Total assistance;Bed level   Lower Body Dressing: Total assistance;Bed level   Toilet Transfer: Total assistance Toilet Transfer Details (indicate cue type and reason): unable  Toileting- Clothing Manipulation and Hygiene: Total assistance;Bed level Toileting - Clothing Manipulation Details (indicate cue type and reason): Pt incotinent of urine.  Assisted with peri care      Functional mobility during ADLs: Total assistance       Vision         Perception     Praxis      Pertinent Vitals/Pain Pain Assessment: Faces Faces Pain Scale: Hurts a little bit Pain Location: generalized  Pain Descriptors / Indicators: Grimacing     Hand Dominance Right   Extremity/Trunk Assessment Upper Extremity Assessment Upper Extremity Assessment: Generalized weakness   Lower Extremity Assessment Lower Extremity Assessment: Defer to PT evaluation  Cervical / Trunk Assessment Cervical / Trunk Assessment: Kyphotic   Communication Communication Communication: Expressive difficulties (limited interaction )   Cognition Arousal/Alertness: Awake/alert Behavior During Therapy: Flat affect Overall Cognitive Status: History of cognitive impairments - at baseline                                     General Comments  spouse  present, and doesn't feel he can continue to provide level of care pt requires     Exercises     Shoulder Instructions      Home Living Family/patient expects to be discharged to:: Skilled nursing facility Living Arrangements: Spouse/significant other                               Additional Comments: Pt lived with spouse PTA.       Prior Functioning/Environment Level of Independence: Needs assistance  Gait / Transfers Assistance Needed: Per spouse, pt does not ambulate.  He reports she stays in recliner at all times, and stands with RW for him to change diapers and perform peri care 2x/day.   ADL's / Homemaking Assistance Needed: Spouse assists with all aspects except self feeding    Comments: Per spouse, pt has not been able to stand for him to change her diapers over the last several days         OT Problem List: Decreased strength;Decreased range of motion;Decreased activity tolerance;Impaired balance (sitting and/or standing);Decreased cognition;Decreased safety awareness;Decreased knowledge of use of DME or AE      OT Treatment/Interventions: Self-care/ADL training;DME and/or AE instruction;Cognitive remediation/compensation;Therapeutic activities;Patient/family education;Balance training    OT Goals(Current goals can be found in the care plan section) Acute Rehab OT Goals Patient Stated Goal: spouse hopes she can walk again and assist with her care  OT Goal Formulation: With patient/family Time For Goal Achievement: 07/23/17 Potential to Achieve Goals: Fair ADL Goals Pt Will Perform Grooming: with mod assist;sitting Pt Will Transfer to Toilet: with max assist;stand pivot transfer;squat pivot transfer;bedside commode  OT Frequency: Min 2X/week   Barriers to D/C: Decreased caregiver support          Co-evaluation              AM-PAC PT "6 Clicks" Daily Activity     Outcome Measure Help from another person eating meals?: A Little Help from  another person taking care of personal grooming?: Total Help from another person toileting, which includes using toliet, bedpan, or urinal?: Total Help from another person bathing (including washing, rinsing, drying)?: Total Help from another person to put on and taking off regular upper body clothing?: Total Help from another person to put on and taking off regular lower body clothing?: Total 6 Click Score: 8   End of Session Equipment Utilized During Treatment: Rolling walker Nurse Communication: Mobility status  Activity Tolerance: Patient limited by lethargy Patient left: in bed;with bed alarm set;with family/visitor present  OT Visit Diagnosis: Unsteadiness on feet (R26.81);Cognitive communication deficit (R41.841)                Time: 0814-4818 OT Time Calculation (min): 34 min Charges:  OT General Charges $OT Visit: 1 Visit OT Evaluation $OT Eval Moderate Complexity: 1 Mod OT Treatments $Self Care/Home Management : 8-22 mins G-Codes:     [facility], OTR/L [phone]   Lucille Passy M 07/09/2017, 2:15 PM

## 2017-07-09 NOTE — Progress Notes (Signed)
PROGRESS NOTE    Ashlee Mack  LKG:[id number] DOB: [birth year] DOA: 07/08/2017 PCP: Arnoldo Morale, MD   Brief Narrative:  Ashlee Mack is Ashlee Mack 76 y.o. female with medical history significant for- HTN, coronary artery disease, chronic stasis dermatitis, recurrent urinary tract infection, dementia. Patient was brought to the ED today with her spouse due to complaints of weakness, lethargy. Baseline patient is able to ambulate with Audrick Lamoureaux walker,but has not being able to do so for 2 days. Also notes some confusion over the past 2 days Patient denies abdominal pain, dysuria, enies flank pain or fever, but Spouse notes chills about 5 days ago. Patient denies difficulty breathing or cough, no chest pain, no diarrhea or vomiting, no photophobia or neck Stiffness or headache.  Assessment & Plan:   Principal Problem:   Sepsis secondary to UTI [facility]) Active Problems:   Chronic venous stasis dermatitis of both lower extremities   Anemia, iron deficiency   Severe protein-calorie malnutrition (HCC)   Essential hypertension   Pressure ulcer   CKD (chronic kidney disease) stage 3, GFR 30-59 ml/min (HCC)   Dementia   Sepsis secondary to UTI - Leukocytosis improving, 17.1 today.  UA with large leukocytes, buddying yeast, RBC's, WBC's.   Urine culture 02/2017- sensitive to cephalosporins and Levaquin. - continue IV ceftriaxone  - add fluconazole  - Follow up urine cultures and blood cultures drawn in ED  Confusion  Difficulty with ambulation:  Ashlee Mack&Ox2 this morning (thought November), otherwise appropriate this morning.  Suspecting 2/2 above.  Strength seemed symmetric and no notable deficits on my exam.  Head CT without acute findings, mild chronic ischemic microvascular disease and age related atrophic change.  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  CTM with treatment of above PT/OT  Severe protein calorie malnutrition - nutrition consult - continue home feeding supplement  Thrombocytosis- platelets 695,  chronically elevated. Hemoglobin low at 9.3- likely anemia of chronic disease,as reflected by 02/2017- ferritin elevated at 858, with low TIBC and normal serum iron.WBC mostly chronically elevated.  - possible myeloproliferative disease, related to follow-up as outpatient. - not on antiplatelet agent at home  Anemia: Microcytic.  - b12, folate, iron, ferritin  Hypertension- stable blood pressure, home medications Norvasc 10 daily - Hold antihypertensives for now in the setting of sepsis  DVT prophylaxis: lovenox Code Status: DNR Family Communication: husband in room  Disposition Plan: pending improvement, PT/OT eval  Consultants:   none  Procedures: (Don't include imaging studies which can be auto populated. Include things that cannot be auto populated i.e. Echo, Carotid and venous dopplers, Foley, Bipap, HD, tubes/drains, wound vac, central lines etc)  none  Antimicrobials: (specify start and planned stop date. Auto populated tables are space occupying and do not give end dates)  Ceftriaxone 10/26 -  Fluconazole 10/27 -     Subjective: Feeling ok.  Tossing and turning.  No numbness, tingling.  Ashlee Mack&Ox2.  No dysuria or fevers.    Objective: Vitals:   07/08/17 2149 07/08/17 2200 07/08/17 2243 07/09/17 0436  BP: 120/72 127/80 120/69 130/72  Pulse: (!) 101 (!) 105 93 99  Resp: (!) 24  20 20   Temp:   99 F (37.2 C) 99.6 F (37.6 C)  TempSrc:   Oral Oral  SpO2: 98%  93% 97%  Weight:   43.1 kg (95 lb 0.3 oz)   Height:   5\' 2"  (1.575 m)     Intake/Output Summary (Last 24 hours) at 07/09/17 1123 Last data filed at 07/09/17 0600  Gross per  24 hour  Intake          1971.67 ml  Output                0 ml  Net          1971.67 ml   Filed Weights   07/08/17 1550 07/08/17 2243  Weight: 49.9 kg (110 lb) 43.1 kg (95 lb 0.3 oz)    Examination:  General exam: Appears calm and comfortable  Respiratory system: Clear to auscultation. Respiratory effort normal. Cardiovascular  system: S1 & S2 heard, RRR. No JVD, murmurs, rubs, gallops or clicks. No pedal edema. Gastrointestinal system: Abdomen is nondistended, soft and nontender. No organomegaly or masses felt. Normal bowel sounds heard. Central nervous system: Alert and oriented. No focal neurological deficits. Extremities: Symmetric 5 x 5 power. Skin: No rashes, lesions or ulcers Psychiatry: Judgement and insight appear normal. Mood & affect appropriate.     Data Reviewed: I have personally reviewed following labs and imaging studies  CBC:  Recent Labs Lab 07/08/17 1553 07/09/17 0509  WBC 27.7* 17.1*  NEUTROABS 21.1*  --   HGB 8.9* 10.8*  HCT 28.3* 33.6*  MCV 73.7* 73.7*  PLT 695* 938*   Basic Metabolic Panel:  Recent Labs Lab 07/08/17 1553 07/09/17 0509  NA 139 139  K 3.6 3.7  CL 105 108  CO2 23 20*  GLUCOSE 104* 90  BUN 15 11  CREATININE 1.09* 0.96  CALCIUM 8.9 8.5*   GFR: Estimated Creatinine Clearance: 34.5 mL/min (by C-G formula based on SCr of 0.96 mg/dL). Liver Function Tests:  Recent Labs Lab 07/08/17 1553  AST 37  ALT 12*  ALKPHOS 97  BILITOT 0.8  PROT 8.7*  ALBUMIN 3.8   No results for input(s): LIPASE, AMYLASE in the last 168 hours. No results for input(s): AMMONIA in the last 168 hours. Coagulation Profile:  Recent Labs Lab 07/08/17 1553  INR 1.14   Cardiac Enzymes: No results for input(s): CKTOTAL, CKMB, CKMBINDEX, TROPONINI in the last 168 hours. BNP (last 3 results) No results for input(s): PROBNP in the last 8760 hours. HbA1C: No results for input(s): HGBA1C in the last 72 hours. CBG:  Recent Labs Lab 07/08/17 1633  GLUCAP 90   Lipid Profile: No results for input(s): CHOL, HDL, LDLCALC, TRIG, CHOLHDL, LDLDIRECT in the last 72 hours. Thyroid Function Tests: No results for input(s): TSH, T4TOTAL, FREET4, T3FREE, THYROIDAB in the last 72 hours. Anemia Panel: No results for input(s): VITAMINB12, FOLATE, FERRITIN, TIBC, IRON, RETICCTPCT in the  last 72 hours. Sepsis Labs:  Recent Labs Lab 07/08/17 1557  LATICACIDVEN 1.87    Recent Results (from the past 240 hour(s))  Culture, blood (Routine x 2)     Status: None (Preliminary result)   Collection Time: 07/08/17  3:48 PM  Result Value Ref Range Status   Specimen Description BLOOD RIGHT FOREARM  Final   Special Requests   Final    BOTTLES DRAWN AEROBIC AND ANAEROBIC Blood Culture adequate volume   Culture   Final    NO GROWTH < 12 HOURS Performed at [facility], [place] [address], [place], [place] [address]    Report Status PENDING  Incomplete  Culture, blood (Routine x 2)     Status: None (Preliminary result)   Collection Time: 07/08/17  3:49 PM  Result Value Ref Range Status   Specimen Description BLOOD RIGHT ANTECUBITAL  Final   Special Requests   Final    BOTTLES DRAWN AEROBIC AND ANAEROBIC  Blood Culture adequate volume   Culture   Final    NO GROWTH < 12 HOURS Performed at [facility] [address], [place], [place] [address]    Report Status PENDING  Incomplete         Radiology Studies: Dg Chest 2 View  Result Date: 07/08/2017 CLINICAL DATA:  Patient with history of urinary tract infection. EXAM: CHEST  2 VIEW COMPARISON:  Chest radiograph 02/19/2017. FINDINGS: Patient is rotated to the left. Stable cardiac and mediastinal contours. Elevation right hemidiaphragm. No large area of pulmonary consolidation. Left basilar atelectasis. No pleural effusion or pneumothorax. Thoracic spine degenerative changes. IMPRESSION: No acute cardiopulmonary process. Electronically Signed   By: Lovey Newcomer M.D.   On: 07/08/2017 16:40   Ct Head Wo Contrast  Result Date: 07/08/2017 CLINICAL DATA:  Worsening confusion over the past 2 days with inability to ambulate over the past couple days. EXAM: CT HEAD WITHOUT CONTRAST TECHNIQUE: Contiguous axial images were obtained from the base of the skull through the vertex without intravenous contrast. COMPARISON:   02/19/2017 FINDINGS: Brain: Ventricles, cisterns and other CSF spaces are within normal as there is minimal age related atrophic change. There is mild chronic ischemic microvascular disease. There is no mass, mass effect, shift of midline structures or acute hemorrhage. No evidence of acute infarction. Small central right sided lacune infarct unchanged per Vascular: No hyperdense vessel or unexpected calcification. Skull: Normal. Negative for fracture or focal lesion. Sinuses/Orbits: No acute finding. Other: None. IMPRESSION: No acute intracranial findings. Mild chronic ischemic microvascular disease and mild age related atrophic change. 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 Electronically Signed   By: Marin Olp M.D.   On: 07/08/2017 17:38        Scheduled Meds: . atorvastatin  10 mg Oral Daily  . docusate sodium  100 mg Oral BID  . enoxaparin (LOVENOX) injection  40 mg Subcutaneous Q24H  . feeding supplement  1 Container Oral BID BM  . polyethylene glycol  17 g Oral Daily   Continuous Infusions: . cefTRIAXone (ROCEPHIN)  IV    . lactated ringers       LOS: 1 day    Time spent: over 30 minutes    Fayrene Helper, MD [facility] Pager [phone]  If 7PM-7AM, please contact night-coverage [email or web address] Password [email or web address] 07/09/2017, 11:23 AM

## 2017-07-09 NOTE — Progress Notes (Signed)
Pharmacy Antibiotic Note  Ashlee Mack is a 76 y.o. female admitted on 07/08/2017 with sepsis secondary to UTI.  Patient is currently on empiric ceftriaxone.   Attending MD notes that budding yeast were seen on U/A and added fluconazole with pharmacy dosing assistance requested  Plan: 1. Continue ceftriaxone 1 gram IV q24h 2. Add fluconazole: 200 mg PO x 1, then 100 mg PO daily (dosage adjusted for CrCl < 50 mL/min) 3. Monitor for signs / symptoms of myopathy during concomitant fluconazole / atorvastatin therapy.  Height: 5\' 2"  (157.5 cm) Weight: 95 lb 0.3 oz (43.1 kg) IBW/kg (Calculated) : 50.1  Temp (24hrs), Avg:98.9 F (37.2 C), Min:98.2 F (36.8 C), Max:99.6 F (37.6 C)   Recent Labs Lab 07/08/17 1553 07/08/17 1557 07/09/17 0509  WBC 27.7*  --  17.1*  CREATININE 1.09*  --  0.96  LATICACIDVEN  --  1.87  --     Estimated Creatinine Clearance: 34.5 mL/min (by C-G formula based on SCr of 0.96 mg/dL).    No Known Allergies  Antimicrobials this admission: 10/26 >> ceftriaxone 10/27 >> fluconazole  Dose adjustments this admission: 10/27 fluconazole maintenance dosage reduced by 50% to 100 mg daily for CrCl < 50 mL/min  Microbiology results: 10/26 BCx: ngtd 10/26 UCx: collected  Thank you for allowing pharmacy to be a part of this patient's care.  Clayburn Pert, PharmD, BCPS Pager: [phone] 07/09/2017  12:21 PM

## 2017-07-10 DIAGNOSIS — N183 Chronic kidney disease, stage 3 (moderate): Secondary | ICD-10-CM

## 2017-07-10 LAB — BASIC METABOLIC PANEL
Anion gap: 9 (ref 5–15)
BUN: 12 mg/dL (ref 6–20)
CALCIUM: 9 mg/dL (ref 8.9–10.3)
CHLORIDE: 109 mmol/L (ref 101–111)
CO2: 23 mmol/L (ref 22–32)
CREATININE: 0.92 mg/dL (ref 0.44–1.00)
GFR calc Af Amer: >60 mL/min (ref >60)
GFR, EST NON AFRICAN AMERICAN: 59 mL/min — AB (ref >60)
Glucose, Bld: 92 mg/dL (ref 65–99)
Potassium: 3.8 mmol/L (ref 3.5–5.1)
SODIUM: 141 mmol/L (ref 135–145)

## 2017-07-10 LAB — URINE CULTURE

## 2017-07-10 LAB — CBC
HCT: 25.8 % — ABNORMAL LOW (ref 36.0–46.0)
Hemoglobin: 8 g/dL — ABNORMAL LOW (ref 12.0–15.0)
MCH: 22.6 pg — AB (ref 26.0–34.0)
MCHC: 31 g/dL (ref 30.0–36.0)
MCV: 72.9 fL — ABNORMAL LOW (ref 78.0–100.0)
PLATELETS: 561 10*3/uL — AB (ref 150–400)
RBC: 3.54 MIL/uL — ABNORMAL LOW (ref 3.87–5.11)
RDW: 32.2 % — AB (ref 11.5–15.5)
WBC: 23.6 10*3/uL — ABNORMAL HIGH (ref 4.0–10.5)

## 2017-07-10 MED ORDER — ADULT MULTIVITAMIN W/MINERALS CH
1.0000 | ORAL_TABLET | Freq: Every day | ORAL | Status: DC
  Administered 2017-07-10 – 2017-07-15 (×6): 1 via ORAL
  Filled 2017-07-10 (×5): qty 1

## 2017-07-10 NOTE — Progress Notes (Signed)
Initial Nutrition Assessment  DOCUMENTATION CODES:   Severe malnutrition in context of chronic illness, Underweight  INTERVENTION:   -Continue Boost Breeze po TID, each supplement provides 250 kcal and 9 grams of protein -Provide Multivitamin with minerals daily -Encourage PO intake -RD will continue to monitor  NUTRITION DIAGNOSIS:   Severe Malnutrition related to chronic illness, lethargy/confusion, poor appetite (worsening dementia) as evidenced by energy intake < or equal to 75% for > or equal to 1 month, severe fat depletion, moderate muscle depletion.  GOAL:   Patient will meet greater than or equal to 90% of their needs  MONITOR:   PO intake, Supplement acceptance, Labs, Weight trends, Skin, I & O's  REASON FOR ASSESSMENT:   Consult Assessment of nutrition requirement/status  ASSESSMENT:   76 y.o. female with medical history significant for- HTN, coronary artery disease, chronic stasis dermatitis, recurrent urinary tract infection, dementia. Patient was brought to the ED today with her spouse due to complaints of weakness, lethargy. Baseline patient is able to ambulate with a walker,but has not being able to do so for 2 days. Also notes some confusion over the past 2 days  Pt in room with husband at bedside. Pt still had lunch tray in room, very little consumed, mostly peaches. Pt with poor appetite for a while now. Pt's husband states she has not been eating much, mainly drinking tea and juice. States she will sometimes each a "cheese cracker".   Pt's husband states she has denied any issues with swallowing or chewing.  Will continue Boost Breeze supplements and order a MVI given poor PO intakes.  Pt with history of inconsistencies with height recordings. Pt's husband states she is 5'2".   Labs reviewed. Medications: Colace capsule BID, Miralax packet daily  NUTRITION - FOCUSED PHYSICAL EXAM:    Most Recent Value  Orbital Region  Mild depletion  Upper Arm Region   Severe depletion  Thoracic and Lumbar Region  Unable to assess  Buccal Region  Unable to assess  Temple Region  Moderate depletion  Clavicle Bone Region  Moderate depletion  Clavicle and Acromion Bone Region  Moderate depletion  Scapular Bone Region  Moderate depletion  Dorsal Hand  Moderate depletion  Patellar Region  Unable to assess  Anterior Thigh Region  Unable to assess  Posterior Calf Region  Unable to assess  Edema (RD Assessment)  None       Diet Order:  Diet Heart Room service appropriate? Yes; Fluid consistency: Thin  EDUCATION NEEDS:   Education needs have been addressed  Skin:  Skin Assessment: Skin Integrity Issues: Skin Integrity Issues:: DTI, Stage II DTI: sacrum Stage II: buttocks  Last BM:  10/28  Height:   Ht Readings from Last 1 Encounters:  07/08/17 5\' 2"  (1.575 m)    Weight:   Wt Readings from Last 1 Encounters:  07/08/17 95 lb 0.3 oz (43.1 kg)    Ideal Body Weight:  50 kg  BMI:  Body mass index is 17.38 kg/m.  Estimated Nutritional Needs:   Kcal:  1200-1400  Protein:  60-70g  Fluid:  1.5L/day  Clayton Bibles, MS, RD, LDN [place] [facility] Pager: [phone] After Hours Pager: [phone]

## 2017-07-10 NOTE — Progress Notes (Signed)
Patient continues to rest in bed with family by bedside. No change from AM assessment, will continue to monitor.

## 2017-07-10 NOTE — Progress Notes (Signed)
PROGRESS NOTE    Ashlee Mack  IZT:[id number] DOB: [birth year] DOA: 07/08/2017 PCP: Arnoldo Morale, MD   Brief Narrative:  Ashlee Mack is Ashlee Mack 76 y.o. female with medical history significant for- HTN, coronary artery disease, chronic stasis dermatitis, recurrent urinary tract infection, dementia. Patient was brought to the ED today with her spouse due to complaints of weakness, lethargy. Baseline patient is able to ambulate with Ashlee Mack walker,but has not being able to do so for 2 days. Also notes some confusion over the past 2 days Patient denies abdominal pain, dysuria, enies flank pain or fever, but Spouse notes chills about 5 days ago. Patient denies difficulty breathing or cough, no chest pain, no diarrhea or vomiting, no photophobia or neck Stiffness or headache.  Assessment & Plan:   Principal Problem:   Sepsis secondary to UTI [facility]) Active Problems:   Chronic venous stasis dermatitis of both lower extremities   Anemia, iron deficiency   Severe protein-calorie malnutrition (HCC)   Essential hypertension   Pressure ulcer   CKD (chronic kidney disease) stage 3, GFR 30-59 ml/min (HCC)   Dementia   Sepsis secondary to UTI - Leukocytosis improving, 17.1 today.  UA with large leukocytes, buddying yeast, RBC's, WBC's.   Urine culture 02/2017- sensitive to cephalosporins and Levaquin. Ucx with multiple species, suggest recollection - continue IV ceftriaxone  - add fluconazole, appreciated dosing by pharmacy - Follow up urine cultures and blood cultures drawn in ED  Confusion  Difficulty with ambulation:  Ashlee Mack&Ox2 this morning (thought November), otherwise appropriate this morning.  Suspecting 2/2 above.  Strength seemed symmetric and no notable deficits on my exam.  Head CT without acute findings, mild chronic ischemic microvascular disease and age related atrophic change.  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   CTM with treatment of above PT/OT recommending SNF  Severe protein calorie malnutrition - nutrition consult - continue home feeding supplement  Thrombocytosis- platelets 695, chronically elevated. Hemoglobin low at 9.3- likely anemia of chronic disease,as reflected by 02/2017- ferritin elevated at 858, with low TIBC and normal serum iron.WBC mostly chronically elevated.  - possible myeloproliferative disease, related to follow-up as outpatient. - not on antiplatelet agent at home  Anemia: Microcytic.  Down today to 8 from 10.8 yesterday, but 8.9 on admission (will ctm as fluctuating widely).  No c/o dark/tarry stool or bloody BM.  VS stable.  - b12, folate, iron, ferritin - stool hemoccult.   Hypertension- stable blood pressure, home medications Norvasc 10 daily - Hold antihypertensives for now in the setting of sepsis  CKD stage 3: CrCl 36, fluconazole being renally dosed  DVT prophylaxis: lovenox Code Status: DNR Family Communication: husband in room  Disposition Plan: pending improvement, PT/OT eval  Consultants:   none  Procedures: (Don't include imaging studies which can be auto populated. Include things that cannot be auto populated i.e. Echo, Carotid and venous dopplers, Foley, Bipap, HD, tubes/drains, wound vac, central lines etc)  none  Antimicrobials: (specify start and planned stop date. Auto populated tables are space occupying and do not give end dates)  Ceftriaxone 10/26 -  Fluconazole 10/27 -     Subjective: Ashlee Mack&Ox2.  Only pain when they try to sit her up.  Otherwise comfortable.   Objective: Vitals:   07/09/17 0436 07/09/17 1543 07/09/17 2057 07/10/17 0456  BP: 130/72 125/74 133/67 115/79  Pulse: 99 97 (!)  105 91  Resp: 20 18 20 18   Temp: 99.6 F (37.6 C) 99.1 F (37.3 C) 99.7 F (37.6 C) 98.7 F (37.1 C)  TempSrc: Oral Oral Oral Oral  SpO2: 97% 99% 97% 96%  Weight:      Height:         Intake/Output Summary (Last 24 hours) at 07/10/17 1003 Last data filed at 07/10/17 0600  Gross per 24 hour  Intake             1820 ml  Output              600 ml  Net             1220 ml   Filed Weights   07/08/17 1550 07/08/17 2243  Weight: 49.9 kg (110 lb) 43.1 kg (95 lb 0.3 oz)    Examination:  General: No acute distress. Cardiovascular: Heart sounds show Ashlee Mack regular rate, and rhythm. No gallops or rubs. No murmurs. No JVD. Lungs: Clear to auscultation bilaterally with good air movement. No rales, rhonchi or wheezes. Abdomen: Soft, mild suprapubic tenderness on exam, nondistended with normal active bowel sounds. No masses. No hepatosplenomegaly. MSK: no midline spine tenderness to palpation Neurological: Alert and oriented 2. Moves all extremities 4. Cranial nerves II through XII grossly intact. Skin: Warm and dry. No rashes or lesions. Extremities: No clubbing or cyanosis. No edema.  Psychiatric: Mood and affect are normal. Insight and judgment are appropriate.  Data Reviewed: I have personally reviewed following labs and imaging studies  CBC:  Recent Labs Lab 07/08/17 1553 07/09/17 0509 07/10/17 0507  WBC 27.7* 17.1* 23.6*  NEUTROABS 21.1*  --   --   HGB 8.9* 10.8* 8.0*  HCT 28.3* 33.6* 25.8*  MCV 73.7* 73.7* 72.9*  PLT 695* 485* 623*   Basic Metabolic Panel:  Recent Labs Lab 07/08/17 1553 07/09/17 0509 07/10/17 0507  NA 139 139 141  K 3.6 3.7 3.8  CL 105 108 109  CO2 23 20* 23  GLUCOSE 104* 90 92  BUN 15 11 12   CREATININE 1.09* 0.96 0.92  CALCIUM 8.9 8.5* 9.0   GFR: Estimated Creatinine Clearance: 35.9 mL/min (by C-G formula based on SCr of 0.92 mg/dL). Liver Function Tests:  Recent Labs Lab 07/08/17 1553  AST 37  ALT 12*  ALKPHOS 97  BILITOT 0.8  PROT 8.7*  ALBUMIN 3.8   No results for input(s): LIPASE, AMYLASE in the last 168 hours. No results for input(s): AMMONIA in the last 168 hours. Coagulation Profile:  Recent Labs Lab  07/08/17 1553  INR 1.14   Cardiac Enzymes: No results for input(s): CKTOTAL, CKMB, CKMBINDEX, TROPONINI in the last 168 hours. BNP (last 3 results) No results for input(s): PROBNP in the last 8760 hours. HbA1C: No results for input(s): HGBA1C in the last 72 hours. CBG:  Recent Labs Lab 07/08/17 1633  GLUCAP 90   Lipid Profile: No results for input(s): CHOL, HDL, LDLCALC, TRIG, CHOLHDL, LDLDIRECT in the last 72 hours. Thyroid Function Tests: No results for input(s): TSH, T4TOTAL, FREET4, T3FREE, THYROIDAB in the last 72 hours. Anemia Panel:  Recent Labs  07/09/17 1130  VITAMINB12 443  FOLATE 13.3  FERRITIN 883*  TIBC 185*  IRON 41   Sepsis Labs:  Recent Labs Lab 07/08/17 1557  LATICACIDVEN 1.87    Recent Results (from the past 240 hour(s))  Culture, blood (Routine x 2)     Status: None (Preliminary result)   Collection Time: 07/08/17  3:48 PM  Result Value Ref Range Status   Specimen Description BLOOD RIGHT FOREARM  Final   Special Requests   Final    BOTTLES DRAWN AEROBIC AND ANAEROBIC Blood Culture adequate volume   Culture   Final    NO GROWTH < 12 HOURS Performed at [facility], [address] [place], [place] [address]    Report Status PENDING  Incomplete  Culture, blood (Routine x 2)     Status: None (Preliminary result)   Collection Time: 07/08/17  3:49 PM  Result Value Ref Range Status   Specimen Description BLOOD RIGHT ANTECUBITAL  Final   Special Requests   Final    BOTTLES DRAWN AEROBIC AND ANAEROBIC Blood Culture adequate volume   Culture   Final    NO GROWTH < 12 HOURS Performed at [facility], [place] [address], [place],  [address]    Report Status PENDING  Incomplete  Urine Culture     Status: Abnormal   Collection Time: 07/08/17  6:40 PM  Result Value Ref Range Status   Specimen Description URINE, RANDOM  Final   Special Requests NONE  Final   Culture MULTIPLE SPECIES PRESENT, SUGGEST RECOLLECTION (Ashlee Mack)  Final    Report Status 07/10/2017 FINAL  Final         Radiology Studies: Dg Chest 2 View  Result Date: 07/08/2017 CLINICAL DATA:  Patient with history of urinary tract infection. EXAM: CHEST  2 VIEW COMPARISON:  Chest radiograph 02/19/2017. FINDINGS: Patient is rotated to the left. Stable cardiac and mediastinal contours. Elevation right hemidiaphragm. No large area of pulmonary consolidation. Left basilar atelectasis. No pleural effusion or pneumothorax. Thoracic spine degenerative changes. IMPRESSION: No acute cardiopulmonary process. Electronically Signed   By: Lovey Newcomer M.D.   On: 07/08/2017 16:40   Ct Head Wo Contrast  Result Date: 07/08/2017 CLINICAL DATA:  Worsening confusion over the past 2 days with inability to ambulate over the past couple days. EXAM: CT HEAD WITHOUT CONTRAST TECHNIQUE: Contiguous axial images were obtained from the base of the skull through the vertex without intravenous contrast. COMPARISON:  02/19/2017 FINDINGS: Brain: Ventricles, cisterns and other CSF spaces are within normal as there is minimal age related atrophic change. There is mild chronic ischemic microvascular disease. There is no mass, mass effect, shift of midline structures or acute hemorrhage. No evidence of acute infarction. Small central right sided lacune infarct unchanged per Vascular: No hyperdense vessel or unexpected calcification. Skull: Normal. Negative for fracture or focal lesion. Sinuses/Orbits: No acute finding. Other: None. IMPRESSION: No acute intracranial findings. Mild chronic ischemic microvascular disease and mild age related atrophic change. 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 Electronically Signed   By: Marin Olp M.D.   On: 07/08/2017 17:38        Scheduled Meds: . atorvastatin  10 mg Oral Daily  . docusate sodium  100 mg Oral BID  . enoxaparin (LOVENOX) injection  30 mg Subcutaneous Q24H  . feeding supplement  1 Container Oral BID BM  . fluconazole  100 mg Oral Daily    . polyethylene glycol  17 g Oral Daily   Continuous Infusions: . cefTRIAXone (ROCEPHIN)  IV Stopped (07/09/17 2112)  . lactated ringers 75 mL/hr at 07/10/17 0156     LOS: 2 days    Time spent: over 30 minutes    Fayrene Helper, MD [facility] Pager [phone]  If 7PM-7AM, please contact night-coverage [email or web address] Password [email or web address] 07/10/2017, 10:03 AM

## 2017-07-10 NOTE — Evaluation (Signed)
Physical Therapy Evaluation Patient Details Name: Ashlee Mack MRN: [id number] DOB: Feb 07, 1941 Today's Date: 07/10/2017   History of Present Illness  This 76 y.o. female admitted with weakness, and lethargy.  Dx:  Sepsis due to UTI.  PMH includes Dementia, recurrent UTI, severe protein -calorie malnutrition, chronic vensous stasis; essential HTN, CKD III  Clinical Impression  Pt admitted with above diagnosis. Pt currently with functional limitations due to the deficits listed below (see PT Problem List). Total assist for supine to sit, max assist for sitting balance, pt refused to attempt transfer. Pt's spouse stated he couldn't make decision about pt going to SNF, "I can't speak to that, the children would have to do that".  Pt will benefit from skilled PT to increase their independence and safety with mobility to allow discharge to the venue listed below.       Follow Up Recommendations SNF    Equipment Recommendations  Hospital bed;Wheelchair (measurements PT);Wheelchair cushion (measurements PT)    Recommendations for Other Services       Precautions / Restrictions Precautions Precautions: Fall Precaution Comments: husband unable to provide pt's fall hx, stated, "I can't remember". He Did recall one fall but not sure when it happened.  Restrictions Weight Bearing Restrictions: No      Mobility  Bed Mobility Overal bed mobility: Needs Assistance Bed Mobility: Sidelying to Sit;Supine to Sit     Supine to sit: Total assist   Sit to sidelying: Total assist General bed mobility comments: Pt assists minimally with all movements, sat on EOB x 5 seconds then pt wanted to return to supine, very flexed posture sitting, max A for sitting balance  Transfers                 General transfer comment: pt refused  Ambulation/Gait                Stairs            Wheelchair Mobility    Modified Rankin (Stroke Patients Only)       Balance Overall balance  assessment: Needs assistance Sitting-balance support: Feet supported Sitting balance-Leahy Scale: Zero                                       Pertinent Vitals/Pain Pain Assessment: Faces Faces Pain Scale: Hurts little more Pain Location: LLE with movement Pain Descriptors / Indicators: Grimacing;Moaning Pain Intervention(s): Limited activity within patient's tolerance;Monitored during session;Repositioned    Home Living Family/patient expects to be discharged to:: Skilled nursing facility Living Arrangements: Spouse/significant other               Additional Comments: Pt lived with spouse PTA.     Prior Function Level of Independence: Needs assistance   Gait / Transfers Assistance Needed: Per spouse, pt does not ambulate.  He reports she stays in recliner at all times, and stands with RW for him to change diapers and perform peri care 2x/day.    ADL's / Homemaking Assistance Needed: Spouse assists with all aspects except self feeding   Comments: Per spouse, pt has not been able to stand for him to change her diapers over the last several days      Hand Dominance   Dominant Hand: Right    Extremity/Trunk Assessment        Lower Extremity Assessment Lower Extremity Assessment: Difficult to assess due to impaired cognition (pt able to  actively DF/PF ankles minimally, moans with pain with attempted PROM of LEs)    Cervical / Trunk Assessment Cervical / Trunk Assessment: Kyphotic  Communication   Communication: Expressive difficulties (limited interaction )  Cognition Arousal/Alertness: Awake/alert Behavior During Therapy: Flat affect Overall Cognitive Status: History of cognitive impairments - at baseline                                        General Comments      Exercises     Assessment/Plan    PT Assessment Patient needs continued PT services  PT Problem List Decreased strength;Decreased range of motion;Decreased  activity tolerance;Decreased balance;Decreased mobility;Pain       PT Treatment Interventions      PT Goals (Current goals can be found in the Care Plan section)  Acute Rehab PT Goals Patient Stated Goal: spouse hopes she can walk again and assist with her care  PT Goal Formulation: With family Time For Goal Achievement: 07/24/17 Potential to Achieve Goals: Fair    Frequency Min 2X/week   Barriers to discharge        Co-evaluation               AM-PAC PT "6 Clicks" Daily Activity  Outcome Measure Difficulty turning over in bed (including adjusting bedclothes, sheets and blankets)?: Unable Difficulty moving from lying on back to sitting on the side of the bed? : Unable Difficulty sitting down on and standing up from a chair with arms (e.g., wheelchair, bedside commode, etc,.)?: Unable Help needed moving to and from a bed to chair (including a wheelchair)?: Total Help needed walking in hospital room?: Total Help needed climbing 3-5 steps with a railing? : Total 6 Click Score: 6    End of Session   Activity Tolerance: Patient limited by pain;Patient limited by fatigue Patient left: in bed;with bed alarm set;with call bell/phone within reach Nurse Communication: Mobility status PT Visit Diagnosis: Muscle weakness (generalized) (M62.81);Difficulty in walking, not elsewhere classified (R26.2);Adult, failure to thrive (R62.7);Pain Pain - Right/Left: Left Pain - part of body: Knee    Time: 8502-7741 PT Time Calculation (min) (ACUTE ONLY): 17 min   Charges:   PT Evaluation $PT Eval Moderate Complexity: 1 Mod     PT G Codes:          [name] 07/10/2017, 9:21 AM [phone]

## 2017-07-11 DIAGNOSIS — R531 Weakness: Secondary | ICD-10-CM

## 2017-07-11 DIAGNOSIS — R7881 Bacteremia: Secondary | ICD-10-CM

## 2017-07-11 LAB — CBC
HEMATOCRIT: 25.5 % — AB (ref 36.0–46.0)
HEMOGLOBIN: 7.8 g/dL — AB (ref 12.0–15.0)
MCH: 22.3 pg — AB (ref 26.0–34.0)
MCHC: 30.6 g/dL (ref 30.0–36.0)
MCV: 73.1 fL — AB (ref 78.0–100.0)
Platelets: 553 10*3/uL — ABNORMAL HIGH (ref 150–400)
RBC: 3.49 MIL/uL — AB (ref 3.87–5.11)
RDW: 32.6 % — ABNORMAL HIGH (ref 11.5–15.5)
WBC: 24.9 10*3/uL — ABNORMAL HIGH (ref 4.0–10.5)

## 2017-07-11 LAB — BLOOD CULTURE ID PANEL (REFLEXED)
ACINETOBACTER BAUMANNII: NOT DETECTED
CANDIDA ALBICANS: NOT DETECTED
CANDIDA KRUSEI: NOT DETECTED
CANDIDA PARAPSILOSIS: NOT DETECTED
Candida glabrata: NOT DETECTED
Candida tropicalis: NOT DETECTED
ENTEROCOCCUS SPECIES: NOT DETECTED
ESCHERICHIA COLI: NOT DETECTED
Enterobacter cloacae complex: NOT DETECTED
Enterobacteriaceae species: NOT DETECTED
Haemophilus influenzae: NOT DETECTED
KLEBSIELLA OXYTOCA: NOT DETECTED
Klebsiella pneumoniae: NOT DETECTED
LISTERIA MONOCYTOGENES: NOT DETECTED
Neisseria meningitidis: NOT DETECTED
Proteus species: NOT DETECTED
Pseudomonas aeruginosa: NOT DETECTED
SERRATIA MARCESCENS: NOT DETECTED
STAPHYLOCOCCUS AUREUS BCID: NOT DETECTED
STREPTOCOCCUS PYOGENES: NOT DETECTED
Staphylococcus species: NOT DETECTED
Streptococcus agalactiae: NOT DETECTED
Streptococcus pneumoniae: NOT DETECTED
Streptococcus species: NOT DETECTED

## 2017-07-11 LAB — BASIC METABOLIC PANEL
Anion gap: 12 (ref 5–15)
BUN: 11 mg/dL (ref 6–20)
CO2: 24 mmol/L (ref 22–32)
Calcium: 8.7 mg/dL — ABNORMAL LOW (ref 8.9–10.3)
Chloride: 103 mmol/L (ref 101–111)
Creatinine, Ser: 0.98 mg/dL (ref 0.44–1.00)
GFR calc Af Amer: >60 mL/min (ref >60)
GFR calc non Af Amer: 55 mL/min — ABNORMAL LOW (ref >60)
Glucose, Bld: 88 mg/dL (ref 65–99)
POTASSIUM: 3.6 mmol/L (ref 3.5–5.1)
SODIUM: 139 mmol/L (ref 135–145)

## 2017-07-11 LAB — PATHOLOGIST SMEAR REVIEW: Path Review: UNDETERMINED

## 2017-07-11 NOTE — Consult Note (Signed)
[place] for Infectious Disease       Reason for Consult: leukocytosis    Referring Physician: Dr. Florene Glen  Principal Problem:   Sepsis secondary to UTI [facility]) Active Problems:   Chronic venous stasis dermatitis of both lower extremities   Anemia, iron deficiency   Severe protein-calorie malnutrition (HCC)   Essential hypertension   Pressure ulcer   CKD (chronic kidney disease) stage 3, GFR 30-59 ml/min (HCC)   Dementia   . atorvastatin  10 mg Oral Daily  . docusate sodium  100 mg Oral BID  . enoxaparin (LOVENOX) injection  30 mg Subcutaneous Q24H  . feeding supplement  1 Container Oral BID BM  . fluconazole  100 mg Oral Daily  . multivitamin with minerals  1 tablet Oral Daily  . polyethylene glycol  17 g Oral Daily    Recommendations:  stop ceftriaxone Stop fluconazole  Assessment: She has had several episodes of weakness that brought her to the ED including June 2018, April and October 2017 and several prior to that. Most admissions she has leukocytosis but no urinary symptoms reported as is true now, though the patient is a poor historian.  Like in June, there are a very minimal amount of leukocytes in the urine now.  It seems that her weakness, confusion is most consistent with progressive decline and waxing and waning status and with very minimal urinary findings, I would not suspect a UTI is leading to the weakness and confusion.  Regardless, she has received 4 days of ceftriaxone and would be adequately treated so will stop.  Fluconazole has been started but no fungal infection suspected at this time, just budding yeast on UA not typically pathogenic.   Leukocytosis - at this time, no infectious source identified.  She previously has had WBC counts up to 31,000 that resolve but this admission has been more persistent.  This has also been in the setting of chronic thrombocytosis and anemia with normal iron.  Unclear etiology, infectious vs other.    Positive  blood culture - 1/2 with GPC in chains, BCID negative.  I suspect this is a contaminate.  Has been treated as above.  I will repeat blood cultures.    Dr. Johnnye Sima on tomorrow   Antibiotics: Ceftriaxone 4 days Fluconazole 3 days  HPI: Ashlee Mack is a 76 y.o. female with multiple admissions for weakness, confusion and presumed infections as above who came in wwith the same.  The husband is the primary historian and reports that she has really had chronic symptoms including very poor po intake, does not walk much, does not speak much and has not complained about anything.  He has found it more difficult to care for here and her family is contemplating SNF placement.  She has not complained of any dysuria, pyuria or sob to him and typically does not say much at all.  No known fever, diarrhea.     Review of Systems:  Unable to be assessed due to mental status All other systems reviewed and are negative    Past Medical History:  Diagnosis Date  . CKD (chronic kidney disease) stage 3, GFR 30-59 ml/min (HCC) 07/31/2015  . Coronary artery disease    Patient denies  . Hyperlipidemia   . Hypertension     Social History  Substance Use Topics  . Smoking status: Never Smoker  . Smokeless tobacco: Never Used  . Alcohol use No    Family History  Problem Relation Age of Onset  .  Hypertension Mother   . Hypertension Father     No Known Allergies  Physical Exam: Constitutional: in no apparent distress, chronically ill appearing, thin Vitals:   07/11/17 0506 07/11/17 1242  BP: 121/75 125/72  Pulse: 93 94  Resp: 20   Temp: 99.4 F (37.4 C) 99.4 F (37.4 C)  SpO2: 95% 93%   EYES: anicteric ENMT: no thrusgh Cardiovascular: Cor Tachy; hyperdynamic heart sounds Respiratory: CTA B; normal respiratory effort GI: Bowel sounds are normal, liver is not enlarged, spleen is not enlarged Musculoskeletal: no pedal edema noted Skin: negatives: no rash Hematologic: no cervical lad  Lab  Results  Component Value Date   WBC 24.9 (H) 07/11/2017   HGB 7.8 (L) 07/11/2017   HCT 25.5 (L) 07/11/2017   MCV 73.1 (L) 07/11/2017   PLT 553 (H) 07/11/2017    Lab Results  Component Value Date   CREATININE 0.98 07/11/2017   BUN 11 07/11/2017   NA 139 07/11/2017   K 3.6 07/11/2017   CL 103 07/11/2017   CO2 24 07/11/2017    Lab Results  Component Value Date   ALT 12 (L) 07/08/2017   AST 37 07/08/2017   ALKPHOS 97 07/08/2017     Microbiology: Recent Results (from the past 240 hour(s))  Culture, blood (Routine x 2)     Status: None (Preliminary result)   Collection Time: 07/08/17  3:48 PM  Result Value Ref Range Status   Specimen Description BLOOD RIGHT FOREARM  Final   Special Requests   Final    BOTTLES DRAWN AEROBIC AND ANAEROBIC Blood Culture adequate volume   Culture  Setup Time   Final    GRAM POSITIVE COCCI IN CHAINS AEROBIC BOTTLE ONLY CRITICAL RESULT CALLED TO, READ BACK BY AND VERIFIED WITH: Minette Brine [id number] [phone] Performed at [facility], [address], [place], [place] [address]    Culture GRAM POSITIVE COCCI IN CHAINS  Final   Report Status PENDING  Incomplete  Blood Culture ID Panel (Reflexed)     Status: None   Collection Time: 07/08/17  3:48 PM  Result Value Ref Range Status   Enterococcus species NOT DETECTED NOT DETECTED Final   Listeria monocytogenes NOT DETECTED NOT DETECTED Final   Staphylococcus species NOT DETECTED NOT DETECTED Final   Staphylococcus aureus NOT DETECTED NOT DETECTED Final   Streptococcus species NOT DETECTED NOT DETECTED Final   Streptococcus agalactiae NOT DETECTED NOT DETECTED Final   Streptococcus pneumoniae NOT DETECTED NOT DETECTED Final   Streptococcus pyogenes NOT DETECTED NOT DETECTED Final   Acinetobacter baumannii NOT DETECTED NOT DETECTED Final   Enterobacteriaceae species NOT DETECTED NOT DETECTED Final   Enterobacter cloacae complex NOT DETECTED NOT DETECTED Final   Escherichia coli NOT DETECTED  NOT DETECTED Final   Klebsiella oxytoca NOT DETECTED NOT DETECTED Final   Klebsiella pneumoniae NOT DETECTED NOT DETECTED Final   Proteus species NOT DETECTED NOT DETECTED Final   Serratia marcescens NOT DETECTED NOT DETECTED Final   Haemophilus influenzae NOT DETECTED NOT DETECTED Final   Neisseria meningitidis NOT DETECTED NOT DETECTED Final   Pseudomonas aeruginosa NOT DETECTED NOT DETECTED Final   Candida albicans NOT DETECTED NOT DETECTED Final   Candida glabrata NOT DETECTED NOT DETECTED Final   Candida krusei NOT DETECTED NOT DETECTED Final   Candida parapsilosis NOT DETECTED NOT DETECTED Final   Candida tropicalis NOT DETECTED NOT DETECTED Final    Comment: Performed at [facility], [place],  [place] [address]  Culture, blood (Routine x 2)     Status: None (Preliminary result)   Collection Time: 07/08/17  3:49 PM  Result Value Ref Range Status   Specimen Description BLOOD RIGHT ANTECUBITAL  Final   Special Requests   Final    BOTTLES DRAWN AEROBIC AND ANAEROBIC Blood Culture adequate volume   Culture   Final    NO GROWTH 3 DAYS Performed at [facility], [address], [place], [place] [address]    Report Status PENDING  Incomplete  Urine Culture     Status: Abnormal   Collection Time: 07/08/17  6:40 PM  Result Value Ref Range Status   Specimen Description URINE, RANDOM  Final   Special Requests NONE  Final   Culture MULTIPLE SPECIES PRESENT, SUGGEST RECOLLECTION (A)  Final   Report Status 07/10/2017 FINAL  Final    Daniele Dillow, [place], [place] [facility] [email or web address] [phone] pager  [phone] cell 07/11/2017, 5:50 PM

## 2017-07-11 NOTE — Progress Notes (Signed)
PHARMACY - PHYSICIAN COMMUNICATION CRITICAL VALUE ALERT - BLOOD CULTURE IDENTIFICATION (BCID)  Results for orders placed or performed during the hospital encounter of 07/08/17  Blood Culture ID Panel (Reflexed) (Collected: 07/08/2017  3:48 PM)  Result Value Ref Range   Enterococcus species NOT DETECTED NOT DETECTED   Listeria monocytogenes NOT DETECTED NOT DETECTED   Staphylococcus species NOT DETECTED NOT DETECTED   Staphylococcus aureus NOT DETECTED NOT DETECTED   Streptococcus species NOT DETECTED NOT DETECTED   Streptococcus agalactiae NOT DETECTED NOT DETECTED   Streptococcus pneumoniae NOT DETECTED NOT DETECTED   Streptococcus pyogenes NOT DETECTED NOT DETECTED   Acinetobacter baumannii NOT DETECTED NOT DETECTED   Enterobacteriaceae species NOT DETECTED NOT DETECTED   Enterobacter cloacae complex NOT DETECTED NOT DETECTED   Escherichia coli NOT DETECTED NOT DETECTED   Klebsiella oxytoca NOT DETECTED NOT DETECTED   Klebsiella pneumoniae NOT DETECTED NOT DETECTED   Proteus species NOT DETECTED NOT DETECTED   Serratia marcescens NOT DETECTED NOT DETECTED   Haemophilus influenzae NOT DETECTED NOT DETECTED   Neisseria meningitidis NOT DETECTED NOT DETECTED   Pseudomonas aeruginosa NOT DETECTED NOT DETECTED   Candida albicans NOT DETECTED NOT DETECTED   Candida glabrata NOT DETECTED NOT DETECTED   Candida krusei NOT DETECTED NOT DETECTED   Candida parapsilosis NOT DETECTED NOT DETECTED   Candida tropicalis NOT DETECTED NOT DETECTED   Micro lab stated GPC with no organism identified. Would consider contaminant  Name of physician (or Provider) Contacted: Dr. Florene Glen  Changes to prescribed antibiotics required: none  Minda Ditto 07/11/2017  10:43 AM

## 2017-07-11 NOTE — Progress Notes (Signed)
Spoke with pt's son who would like information for SNF.  CSW will follow.

## 2017-07-11 NOTE — Progress Notes (Signed)
PROGRESS NOTE    Ashlee Mack  WUJ:[id number] DOB: 19-Dec-1940 DOA: 07/08/2017 PCP: Arnoldo Morale, MD   Brief Narrative:  Ashlee Mack is Ashlee Mack 76 y.o. female with medical history significant for- HTN, coronary artery disease, chronic stasis dermatitis, recurrent urinary tract infection, dementia. Patient was brought to the ED today with her spouse due to complaints of weakness, lethargy. Baseline patient is able to ambulate with Ashlee Mack walker,but has not being able to do so for 2 days. Also notes some confusion over the past 2 days Patient denies abdominal pain, dysuria, enies flank pain or fever, but Spouse notes chills about 5 days ago. Patient denies difficulty breathing or cough, no chest pain, no diarrhea or vomiting, no photophobia or neck Stiffness or headache.  Assessment & Plan:   Principal Problem:   Sepsis secondary to UTI [facility]) Active Problems:   Chronic venous stasis dermatitis of both lower extremities   Anemia, iron deficiency   Severe protein-calorie malnutrition (HCC)   Essential hypertension   Pressure ulcer   CKD (chronic kidney disease) stage 3, GFR 30-59 ml/min (HCC)   Dementia   Sepsis secondary to UTI - Leukocytosis initially improving, but now worse.  UA with large leukocytes, buddying yeast, RBC's, WBC's, but with multiple species growing. Currently treating for UTI with ceftriaxone and fluconazole empirically. Blood cx with gram positive cocci in chains, but negative BCID.  1/2 growing gram positive cocci in chains.  Suspect contaminant.     Urine culture 02/2017- sensitive to cephalosporins and Levaquin. - continue IV ceftriaxone  - add fluconazole, appreciated dosing by pharmacy - Follow up urine cultures and blood cultures drawn in ED  Confusion  Difficulty with ambulation:  Ashlee Mack&Ox2 this morning (thought November), otherwise appropriate this morning.  Suspecting 2/2 above.  Strength seemed symmetric and no notable deficits on my exam.  Head CT without acute  findings, mild chronic ischemic microvascular disease and age related atrophic change.  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  OT noted she hadn't walked "in [place] long time" with progressive weakness, reportedly stays in recliner and he stands her 2x/day to change diapers.   CTM with treatment of above PT/OT recommending SNF  Severe protein calorie malnutrition - nutrition consult - continue home feeding supplement  Thrombocytosis- platelets 695, chronically elevated. Hemoglobin low at 9.3- likely anemia of chronic disease,as reflected by 02/2017- ferritin elevated at 858, with low TIBC and normal serum iron.WBC mostly chronically elevated.  - possible myeloproliferative disease, related to follow-up as outpatient. - not on antiplatelet agent at home  Anemia: Microcytic.  7.8 today, relatively stable since yesterday (max 10.8 during this admission, but 8.9 at presentation).  Low suspicion for bleeding.  Iron panel suggestive of AOCD.   - b12, folate, iron, ferritin - stool hemoccult.   Hypertension- stable blood pressure, home medications Norvasc 10 daily - Hold antihypertensives for now in the setting of sepsis  CKD stage 3: CrCl 36, fluconazole being renally dosed  DVT prophylaxis: lovenox Code Status: DNR Family Communication: husband in room  Disposition Plan: pending improvement, PT/OT eval  Consultants:   none  Procedures: (Don't include imaging studies which can be auto populated. Include things that cannot be auto populated i.e. Echo, Carotid and venous dopplers, Foley, Bipap, HD, tubes/drains, wound vac, central lines etc)  none  Antimicrobials: (specify start and planned stop date. Auto populated tables are space occupying and do not give end dates)  Ceftriaxone 10/26 -  Fluconazole 10/27 -     Subjective: Ashlee Mack&Ox3.  Denies pain.  Denies abdominal pain, fevers.  Objective: Vitals:   07/10/17 0456 07/10/17 1326 07/10/17 2051 07/11/17 0506  BP: 115/79 112/67 126/64 121/75    Pulse: 91 97 100 93  Resp: 18 19 20 20   Temp: 98.7 F (37.1 C) 99 F (37.2 C) 99.7 F (37.6 C) 99.4 F (37.4 C)  TempSrc: Oral Oral Oral Oral  SpO2: 96% 95% 96% 95%  Weight:      Height:        Intake/Output Summary (Last 24 hours) at 07/11/17 0840 Last data filed at 07/11/17 0600  Gross per 24 hour  Intake             1955 ml  Output                0 ml  Net             1955 ml   Filed Weights   07/08/17 1550 07/08/17 2243  Weight: 49.9 kg (110 lb) 43.1 kg (95 lb 0.3 oz)    Examination:  General: No acute distress. Cardiovascular: Heart sounds show Ashlee Mack regular rate, and rhythm. No gallops or rubs. No murmurs. No JVD. Lungs: Clear to auscultation bilaterally with good air movement. No rales, rhonchi or wheezes. Abdomen: Soft, nontender, nondistended with normal active bowel sounds. No masses. No hepatosplenomegaly. Neurological: Alert and oriented 3. Moves all extremities 4 with equal strength. Cranial nerves II through XII grossly intact. Skin: Warm and dry. No rashes or lesions.  (skin tear to buttocks, but no pressure injury noted - examined 10/28, also no pressure injury to heels noted at that time) Extremities: No clubbing or cyanosis. No edema. Psychiatric: Mood and affect are normal. Insight and judgment are appropriate.  Data Reviewed: I have personally reviewed following labs and imaging studies  CBC:  Recent Labs Lab 07/08/17 1553 07/09/17 0509 07/10/17 0507 07/11/17 0434  WBC 27.7* 17.1* 23.6* 24.9*  NEUTROABS 21.1*  --   --   --   HGB 8.9* 10.8* 8.0* 7.8*  HCT 28.3* 33.6* 25.8* 25.5*  MCV 73.7* 73.7* 72.9* 73.1*  PLT 695* 485* 561* 017*   Basic Metabolic Panel:  Recent Labs Lab 07/08/17 1553 07/09/17 0509 07/10/17 0507 07/11/17 0434  NA 139 139 141 139  K 3.6 3.7 3.8 3.6  CL 105 108 109 103  CO2 23 20* 23 24  GLUCOSE 104* 90 92 88  BUN 15 11 12 11   CREATININE 1.09* 0.96 0.92 0.98  CALCIUM 8.9 8.5* 9.0 8.7*   GFR: Estimated  Creatinine Clearance: 33.7 mL/min (by C-G formula based on SCr of 0.98 mg/dL). Liver Function Tests:  Recent Labs Lab 07/08/17 1553  AST 37  ALT 12*  ALKPHOS 97  BILITOT 0.8  PROT 8.7*  ALBUMIN 3.8   No results for input(s): LIPASE, AMYLASE in the last 168 hours. No results for input(s): AMMONIA in the last 168 hours. Coagulation Profile:  Recent Labs Lab 07/08/17 1553  INR 1.14   Cardiac Enzymes: No results for input(s): CKTOTAL, CKMB, CKMBINDEX, TROPONINI in the last 168 hours. BNP (last 3 results) No results for input(s): PROBNP in the last 8760 hours. HbA1C: No results for input(s): HGBA1C in the last 72 hours. CBG:  Recent Labs Lab 07/08/17 1633  GLUCAP 90   Lipid Profile: No results for input(s): CHOL, HDL, LDLCALC, TRIG, CHOLHDL, LDLDIRECT in the last 72 hours. Thyroid Function Tests: No results for input(s): TSH, T4TOTAL, FREET4, T3FREE, THYROIDAB in the last 72 hours. Anemia Panel:  Recent Labs  07/09/17 1130  VITAMINB12 443  FOLATE 13.3  FERRITIN 883*  TIBC 185*  IRON 41   Sepsis Labs:  Recent Labs Lab 07/08/17 1557  LATICACIDVEN 1.87    Recent Results (from the past 240 hour(s))  Culture, blood (Routine x 2)     Status: None (Preliminary result)   Collection Time: 07/08/17  3:48 PM  Result Value Ref Range Status   Specimen Description BLOOD RIGHT FOREARM  Final   Special Requests   Final    BOTTLES DRAWN AEROBIC AND ANAEROBIC Blood Culture adequate volume   Culture  Setup Time   Final    GRAM POSITIVE COCCI IN CHAINS AEROBIC BOTTLE ONLY Organism ID to follow Performed at [facility], [place] [address] [place], [place] [address]    Culture GRAM POSITIVE COCCI IN CHAINS  Final   Report Status PENDING  Incomplete  Culture, blood (Routine x 2)     Status: None (Preliminary result)   Collection Time: 07/08/17  3:49 PM  Result Value Ref Range Status   Specimen Description BLOOD RIGHT ANTECUBITAL  Final   Special Requests   Final     BOTTLES DRAWN AEROBIC AND ANAEROBIC Blood Culture adequate volume   Culture   Final    NO GROWTH 2 DAYS Performed at [facility], [place] [address], [place], [place] [address]    Report Status PENDING  Incomplete  Urine Culture     Status: Abnormal   Collection Time: 07/08/17  6:40 PM  Result Value Ref Range Status   Specimen Description URINE, RANDOM  Final   Special Requests NONE  Final   Culture MULTIPLE SPECIES PRESENT, SUGGEST RECOLLECTION (Ashlee Mack)  Final   Report Status 07/10/2017 FINAL  Final         Radiology Studies: No results found.      Scheduled Meds: . atorvastatin  10 mg Oral Daily  . docusate sodium  100 mg Oral BID  . enoxaparin (LOVENOX) injection  30 mg Subcutaneous Q24H  . feeding supplement  1 Container Oral BID BM  . fluconazole  100 mg Oral Daily  . multivitamin with minerals  1 tablet Oral Daily  . polyethylene glycol  17 g Oral Daily   Continuous Infusions: . cefTRIAXone (ROCEPHIN)  IV Stopped (07/10/17 1946)  . lactated ringers 75 mL/hr at 07/10/17 0156     LOS: 3 days    Time spent: over 30 minutes    Fayrene Helper, MD [facility] Pager [phone]  If 7PM-7AM, please contact night-coverage [email or web address] Password [email or web address] 07/11/2017, 8:40 AM

## 2017-07-11 NOTE — Care Management Important Message (Signed)
Important Message  Patient Details  Name: Ashlee Mack MRN: [id number] Date of Birth: 08/29/1941   Medicare Important Message Given:  Yes    Kerin Salen 07/11/2017, 11:14 AMImportant Message  Patient Details  Name: Ashlee Mack MRN: [id number] Date of Birth: 07-18-1941   Medicare Important Message Given:  Yes    Kerin Salen 07/11/2017, 11:14 AM

## 2017-07-12 DIAGNOSIS — D638 Anemia in other chronic diseases classified elsewhere: Secondary | ICD-10-CM

## 2017-07-12 LAB — BASIC METABOLIC PANEL
Anion gap: 10 (ref 5–15)
BUN: 14 mg/dL (ref 6–20)
CALCIUM: 8.6 mg/dL — AB (ref 8.9–10.3)
CO2: 24 mmol/L (ref 22–32)
CREATININE: 1.01 mg/dL — AB (ref 0.44–1.00)
Chloride: 105 mmol/L (ref 101–111)
GFR calc Af Amer: >60 mL/min (ref >60)
GFR, EST NON AFRICAN AMERICAN: 53 mL/min — AB (ref >60)
GLUCOSE: 89 mg/dL (ref 65–99)
POTASSIUM: 3.9 mmol/L (ref 3.5–5.1)
SODIUM: 139 mmol/L (ref 135–145)

## 2017-07-12 LAB — CBC
HCT: 25.5 % — ABNORMAL LOW (ref 36.0–46.0)
Hemoglobin: 7.9 g/dL — ABNORMAL LOW (ref 12.0–15.0)
MCH: 22.9 pg — AB (ref 26.0–34.0)
MCHC: 31 g/dL (ref 30.0–36.0)
MCV: 73.9 fL — AB (ref 78.0–100.0)
PLATELETS: 545 10*3/uL — AB (ref 150–400)
RBC: 3.45 MIL/uL — AB (ref 3.87–5.11)
RDW: 32.6 % — AB (ref 11.5–15.5)
WBC: 24.5 10*3/uL — ABNORMAL HIGH (ref 4.0–10.5)

## 2017-07-12 NOTE — Progress Notes (Signed)
Physical Therapy Treatment Patient Details Name: Ashlee Mack MRN: [id number] DOB: 1940-12-23 Today's Date: 07/12/2017    History of Present Illness This 76 y.o. female admitted with weakness, and lethargy.  Dx:  Sepsis due to UTI.  PMH includes Dementia, recurrent UTI, severe protein -calorie malnutrition, chronic vensous stasis; essential HTN, CKD III    PT Comments    Total assist for supine to sit (pt 10%), sat on edge of bed x 1 minute, sitting tolerance limited by abdominal pain. Pt refused to attempt transfer and refused BLE exercises in supine. SNF recommended.   Follow Up Recommendations  SNF     Equipment Recommendations  Hospital bed;Wheelchair (measurements PT);Wheelchair cushion (measurements PT)    Recommendations for Other Services       Precautions / Restrictions Precautions Precautions: Fall Precaution Comments: husband unable to provide pt's fall hx, stated, "I can't remember". He Did recall one fall but not sure when it happened.  Restrictions Weight Bearing Restrictions: No    Mobility  Bed Mobility Overal bed mobility: Needs Assistance Bed Mobility: Sidelying to Sit;Supine to Sit     Supine to sit: Total assist   Sit to sidelying: Total assist General bed mobility comments: Pt assists minimally with all movements, sat on EOB x 60 seconds then pt wanted to return to supine, very flexed posture sitting, c/o pain (didn't respond when asked location, pt was guarding R lower abdomen), min A for sitting balance; +2 total to scoot up in bed  Transfers                 General transfer comment: pt refused  Ambulation/Gait                 Stairs            Wheelchair Mobility    Modified Rankin (Stroke Patients Only)       Balance Overall balance assessment: Needs assistance Sitting-balance support: Feet supported Sitting balance-Leahy Scale: Poor                                      Cognition  Arousal/Alertness: Awake/alert Behavior During Therapy: Flat affect Overall Cognitive Status: History of cognitive impairments - at baseline                                        Exercises General Exercises - Lower Extremity Long Arc Quad: AROM;Both;5 reps;Seated    General Comments        Pertinent Vitals/Pain Faces Pain Scale: Hurts even more Pain Location: R lower abdomen with movement Pain Descriptors / Indicators: Grimacing;Crying Pain Intervention(s): Limited activity within patient's tolerance;Monitored during session;Repositioned    Home Living                      Prior Function            PT Goals (current goals can now be found in the care plan section) Acute Rehab PT Goals Patient Stated Goal: spouse hopes she can walk again and assist with her care  PT Goal Formulation: With family Time For Goal Achievement: 07/24/17 Potential to Achieve Goals: Fair Progress towards PT goals: Progressing toward goals    Frequency    Min 2X/week      PT Plan      Co-evaluation  AM-PAC PT "6 Clicks" Daily Activity  Outcome Measure  Difficulty turning over in bed (including adjusting bedclothes, sheets and blankets)?: Unable Difficulty moving from lying on back to sitting on the side of the bed? : Unable Difficulty sitting down on and standing up from a chair with arms (e.g., wheelchair, bedside commode, etc,.)?: Unable Help needed moving to and from a bed to chair (including a wheelchair)?: Total Help needed walking in hospital room?: Total Help needed climbing 3-5 steps with a railing? : Total 6 Click Score: 6    End of Session   Activity Tolerance: Patient limited by pain;Patient limited by fatigue Patient left: in bed;with bed alarm set;with call bell/phone within reach;with family/visitor present Nurse Communication: Mobility status PT Visit Diagnosis: Muscle weakness (generalized) (M62.81);Difficulty in walking,  not elsewhere classified (R26.2);Adult, failure to thrive (R62.7);Pain     Time: 1344-1400 PT Time Calculation (min) (ACUTE ONLY): 16 min  Charges:  $Therapeutic Activity: 8-22 mins                    G Codes:          Blondell Reveal Kistler 07/12/2017, 2:09 PM [phone]

## 2017-07-12 NOTE — Clinical Social Work Note (Signed)
Clinical Social Work Assessment  Patient Details  Name: Ashlee Mack MRN: [id number] Date of Birth: 05-21-1941  Date of referral:  07/12/17               Reason for consult:  Facility Placement, Discharge Planning                Permission sought to share information with:  Chartered certified accountant granted to share information::  Yes, Verbal Permission Granted  Name::        Agency::     Relationship::     Contact Information:     Housing/Transportation Living arrangements for the past 2 months:  Apartment Source of Information:  Spouse Patient Interpreter Needed:  None Criminal Activity/Legal Involvement Pertinent to Current Situation/Hospitalization:  No - Comment as needed Significant Relationships:  Adult Children, Spouse Lives with:  Spouse Do you feel safe going back to the place where you live?   (PT recommending SNF) Need for family participation in patient care:  Yes (Comment)  Care giving concerns:  Patient from home with spouse. Patient's husband reports that he assists patient with most ADLs, noting that his granddaughter comes over to bathe patient. Patient's husband reported that patient remains recliner and gets up to allow him to change her. PT recommending SNF.   Social Worker assessment / plan:  CSW spoke with patient/patient's husband at bedside regarding PT recommendation for SNF, patient alert and did not engage in assessment. Patient's husband reported that they are interested in SNF for ST rehab for patient. Patient's husband reported that CSW can discuss discharge planning with their son.   CSW contacted patient's son Janayia Burggraf [phone]), no answer. CSW left voicemail. CSW will continue to follow up with patient's son.   CSW will complete FL2 and provide bed offers.  Employment status:  Retired Forensic scientist:  Medicare PT Recommendations:  [place] / Referral to community resources:  [place]  Patient/Family's Response to care:  Patient's husband agreeable to SNF for rehab. Patient's husband reported that patient's care has been "so far pretty good". Patient's husband thanked CSW for assistance with discharge planning.   Patient/Family's Understanding of and Emotional Response to Diagnosis, Current Treatment, and Prognosis:  Patient presented calm and did not engage in assessment. Patient's husband verbalized some understanding of patient's current treatment and verbalized plan for patient to discharge to SNF. Patient's husband supportive and involved in patient's care, noting he has been spending the night with patient every night since Friday. Patient's husband hopeful that patient will get stronger and return home.   Emotional Assessment Appearance:  Appears stated age Attitude/Demeanor/Rapport:  Unable to Assess Affect (typically observed):  Calm Orientation:  Oriented to Self Alcohol / Substance use:  Not Applicable Psych involvement (Current and /or in the community):  No (Comment)  Discharge Needs  Concerns to be addressed:  Discharge Planning Concerns, Care Coordination Readmission within the last 30 days:  No Current discharge risk:  Dependent with Mobility Barriers to Discharge:  Continued Medical Work up   [employer], LCSW 07/12/2017, 12:02 PM

## 2017-07-12 NOTE — Progress Notes (Signed)
PROGRESS NOTE    Ashlee Mack  ACZ:[id number] DOB: [birth year] DOA: 07/08/2017 PCP: Arnoldo Morale, MD   Brief Narrative:  Ashlee Mack is Ashlee Mack 76 y.o. female with medical history significant for- HTN, coronary artery disease, chronic stasis dermatitis, recurrent urinary tract infection, dementia. Patient was brought to the ED today with her spouse due to complaints of weakness, lethargy. Baseline patient is able to ambulate with [name] walker,but has not being able to do so for 2 days. Also notes some confusion over the past 2 days Patient denies abdominal pain, dysuria, enies flank pain or fever, but Spouse notes chills about 5 days ago. Patient denies difficulty breathing or cough, no chest pain, no diarrhea or vomiting, no photophobia or neck Stiffness or headache.  Assessment & Plan:   Principal Problem:   Sepsis secondary to UTI [facility]) Active Problems:   Chronic venous stasis dermatitis of both lower extremities   Anemia, iron deficiency   Severe protein-calorie malnutrition (HCC)   Essential hypertension   Pressure ulcer   CKD (chronic kidney disease) stage 3, GFR 30-59 ml/min (HCC)   Dementia   Sepsis secondary to UTI - Leukocytosis initially improving, but now worse.  UA with large leukocytes, buddying yeast, RBC's, WBC's, but with multiple species growing. Blood cx with gram positive cocci in chains, but negative BCID.  1/2 growing gram positive cocci in chains.  Suspect contaminant.   [ ]  ID c/s yesterday for rising wbc count.  Awaiting final bcx.  No obvious cause for leukocytosis at this point.  CTM.  Dc fluconazole and ceftriaxone with urine cx as above  Confusion  Difficulty with ambulation:  Ashlee Mack&Ox2 this morning (thought November), otherwise appropriate this morning.  Suspecting 2/2 above.  Strength seemed symmetric and no notable deficits on my exam.  Head CT without acute findings, mild chronic ischemic microvascular disease and age related atrophic change.  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  OT noted she hadn't walked "in [place] long time" with progressive weakness, reportedly stays in recliner and he stands her 2x/day to change diapers.   PT/OT recommending SNF Placement pending  Severe protein calorie malnutrition - nutrition consult - continue home feeding supplement  Thrombocytosis- platelets 695, chronically elevated. Hemoglobin low at 9.3- likely anemia of chronic disease,as reflected by 02/2017- ferritin elevated at 858, with low TIBC and normal serum iron.WBC mostly chronically elevated.  - possible myeloproliferative disease, follow-up as outpatient. - not on antiplatelet agent at home  Anemia: Microcytic.  7.8 today, relatively stable since yesterday (max 10.8 during this admission, but 8.9 at presentation).  Low suspicion for bleeding.  Iron panel suggestive of AOCD.   - stool hemoccult.   Hypertension- stable blood pressure, home medications Norvasc 10 daily - Hold antihypertensives for now   CKD stage 3: CrCl 36  DVT prophylaxis: lovenox Code Status: DNR Family Communication: husband in room  Disposition Plan: pending improvement, PT/OT eval  Consultants:   none  Procedures: (Don't include imaging studies which can be auto populated. Include things that cannot be auto populated i.e. Echo, Carotid and venous dopplers, Foley, Bipap, HD, tubes/drains, wound vac, central lines etc)  none  Antimicrobials: (specify start and planned stop date. Auto populated tables are space occupying and do not give end dates)  Ceftriaxone 10/26 - 10/29  Fluconazole 10/27 -  20/29   Subjective: Ashlee Mack&Ox3.  Denies pain or discomfort.   Objective: Vitals:   07/11/17 1242 07/11/17 2119 07/12/17 0639 07/12/17 0842  BP: 125/72 (!) 151/79 126/69 113/71  Pulse:  94 98 91 94  Resp:  17 17 19   Temp: 99.4 F (37.4 C) 99.5 F (37.5 C) 99.3 F (37.4 C) 98.7 F (37.1 C)  TempSrc: Oral Oral Oral Oral  SpO2: 93% 94% 95% 94%  Weight:      Height:        Intake/Output  Summary (Last 24 hours) at 07/12/17 1332 Last data filed at 07/12/17 0600  Gross per 24 hour  Intake             1800 ml  Output             1000 ml  Net              800 ml   Filed Weights   07/08/17 1550 07/08/17 2243  Weight: 49.9 kg (110 lb) 43.1 kg (95 lb 0.3 oz)    Examination:  General: No acute distress.  Frail.  Cardiovascular: Heart sounds show Angeliah Wisdom regular rate, and rhythm. No gallops or rubs. No murmurs. No JVD. Lungs: Clear to auscultation bilaterally with good air movement. No rales, rhonchi or wheezes. Abdomen: Soft, nontender, nondistended with normal active bowel sounds. No masses. No hepatosplenomegaly. Neurological: Alert and oriented 3. Moves all extremities 4 with equal strength. Cranial nerves II through XII grossly intact. Skin: Warm and dry. No rashes or lesions. Extremities: No clubbing or cyanosis. No edema.  Psychiatric: Mood and affect are normal. Insight and judgment are appropriate.  Data Reviewed: I have personally reviewed following labs and imaging studies  CBC:  Recent Labs Lab 07/08/17 1553 07/09/17 0509 07/10/17 0507 07/11/17 0434 07/12/17 0430  WBC 27.7* 17.1* 23.6* 24.9* 24.5*  NEUTROABS 21.1*  --   --   --   --   HGB 8.9* 10.8* 8.0* 7.8* 7.9*  HCT 28.3* 33.6* 25.8* 25.5* 25.5*  MCV 73.7* 73.7* 72.9* 73.1* 73.9*  PLT 695* 485* 561* 553* 811*   Basic Metabolic Panel:  Recent Labs Lab 07/08/17 1553 07/09/17 0509 07/10/17 0507 07/11/17 0434 07/12/17 0430  NA 139 139 141 139 139  K 3.6 3.7 3.8 3.6 3.9  CL 105 108 109 103 105  CO2 23 20* 23 24 24   GLUCOSE 104* 90 92 88 89  BUN 15 11 12 11 14   CREATININE 1.09* 0.96 0.92 0.98 1.01*  CALCIUM 8.9 8.5* 9.0 8.7* 8.6*   GFR: Estimated Creatinine Clearance: 32.7 mL/min (Ashlee Mack) (by C-G formula based on SCr of 1.01 mg/dL (H)). Liver Function Tests:  Recent Labs Lab 07/08/17 1553  AST 37  ALT 12*  ALKPHOS 97  BILITOT 0.8  PROT 8.7*  ALBUMIN 3.8   No results for input(s):  LIPASE, AMYLASE in the last 168 hours. No results for input(s): AMMONIA in the last 168 hours. Coagulation Profile:  Recent Labs Lab 07/08/17 1553  INR 1.14   Cardiac Enzymes: No results for input(s): CKTOTAL, CKMB, CKMBINDEX, TROPONINI in the last 168 hours. BNP (last 3 results) No results for input(s): PROBNP in the last 8760 hours. HbA1C: No results for input(s): HGBA1C in the last 72 hours. CBG:  Recent Labs Lab 07/08/17 1633  GLUCAP 90   Lipid Profile: No results for input(s): CHOL, HDL, LDLCALC, TRIG, CHOLHDL, LDLDIRECT in the last 72 hours. Thyroid Function Tests: No results for input(s): TSH, T4TOTAL, FREET4, T3FREE, THYROIDAB in the last 72 hours. Anemia Panel: No results for input(s): VITAMINB12, FOLATE, FERRITIN, TIBC, IRON, RETICCTPCT in the last 72 hours. Sepsis Labs:  Recent Labs Lab 07/08/17 1557  LATICACIDVEN 1.87  Recent Results (from the past 240 hour(s))  Culture, blood (Routine x 2)     Status: None (Preliminary result)   Collection Time: 07/08/17  3:48 PM  Result Value Ref Range Status   Specimen Description BLOOD RIGHT FOREARM  Final   Special Requests   Final    BOTTLES DRAWN AEROBIC AND ANAEROBIC Blood Culture adequate volume   Culture  Setup Time   Final    GRAM POSITIVE COCCI IN CHAINS AEROBIC BOTTLE ONLY CRITICAL RESULT CALLED TO, READ BACK BY AND VERIFIED WITH: Ashlee Mack [id number] MLM Performed at [facility], [place] [address] [place], [place] [address]    Culture GRAM POSITIVE COCCI IN CHAINS  Final   Report Status PENDING  Incomplete  Blood Culture ID Panel (Reflexed)     Status: None   Collection Time: 07/08/17  3:48 PM  Result Value Ref Range Status   Enterococcus species NOT DETECTED NOT DETECTED Final   Listeria monocytogenes NOT DETECTED NOT DETECTED Final   Staphylococcus species NOT DETECTED NOT DETECTED Final   Staphylococcus aureus NOT DETECTED NOT DETECTED Final   Streptococcus species NOT DETECTED NOT  DETECTED Final   Streptococcus agalactiae NOT DETECTED NOT DETECTED Final   Streptococcus pneumoniae NOT DETECTED NOT DETECTED Final   Streptococcus pyogenes NOT DETECTED NOT DETECTED Final   Acinetobacter baumannii NOT DETECTED NOT DETECTED Final   Enterobacteriaceae species NOT DETECTED NOT DETECTED Final   Enterobacter cloacae complex NOT DETECTED NOT DETECTED Final   Escherichia coli NOT DETECTED NOT DETECTED Final   Klebsiella oxytoca NOT DETECTED NOT DETECTED Final   Klebsiella pneumoniae NOT DETECTED NOT DETECTED Final   Proteus species NOT DETECTED NOT DETECTED Final   Serratia marcescens NOT DETECTED NOT DETECTED Final   Haemophilus influenzae NOT DETECTED NOT DETECTED Final   Neisseria meningitidis NOT DETECTED NOT DETECTED Final   Pseudomonas aeruginosa NOT DETECTED NOT DETECTED Final   Candida albicans NOT DETECTED NOT DETECTED Final   Candida glabrata NOT DETECTED NOT DETECTED Final   Candida krusei NOT DETECTED NOT DETECTED Final   Candida parapsilosis NOT DETECTED NOT DETECTED Final   Candida tropicalis NOT DETECTED NOT DETECTED Final    Comment: Performed at [facility], [place] [address] [place], [place] [address]  Culture, blood (Routine x 2)     Status: None (Preliminary result)   Collection Time: 07/08/17  3:49 PM  Result Value Ref Range Status   Specimen Description BLOOD RIGHT ANTECUBITAL  Final   Special Requests   Final    BOTTLES DRAWN AEROBIC AND ANAEROBIC Blood Culture adequate volume   Culture   Final    NO GROWTH 3 DAYS Performed at [facility], [place] [address], [place], [place] [address]    Report Status PENDING  Incomplete  Urine Culture     Status: Abnormal   Collection Time: 07/08/17  6:40 PM  Result Value Ref Range Status   Specimen Description URINE, RANDOM  Final   Special Requests NONE  Final   Culture MULTIPLE SPECIES PRESENT, SUGGEST RECOLLECTION (Ashlee Mack)  Final   Report Status 07/10/2017 FINAL  Final         Radiology  Studies: No results found.      Scheduled Meds: . atorvastatin  10 mg Oral Daily  . docusate sodium  100 mg Oral BID  . enoxaparin (LOVENOX) injection  30 mg Subcutaneous Q24H  . feeding supplement  1 Container Oral BID BM  . multivitamin with minerals  1 tablet Oral Daily  . polyethylene glycol  17 g Oral Daily   Continuous Infusions: . lactated ringers 75 mL/hr at 07/12/17 0714     LOS: 4 days    Time spent: over 20 minutes    Fayrene Helper, MD [facility] Pager [phone]  If 7PM-7AM, please contact night-coverage [email or web address] Password [email or web address] 07/12/2017, 1:32 PM

## 2017-07-12 NOTE — NC FL2 (Signed)
[place] LEVEL OF CARE SCREENING TOOL     IDENTIFICATION  Patient Name: Ashlee Mack Birthdate: 03-08-1941 Sex: female Admission Date (Current Location): 07/08/2017  [facility] and [place] Number:  Herbalist and Address:  [facility],  [place], [place]      Provider Number: [id number]  Attending Physician Name and Address:  Elodia Florence., *  Relative Name and Phone Number:       Current Level of Care: Hospital Recommended Level of Care: [place] Prior Approval Number:    Date Approved/Denied:   PASRR Number: [id number]  Discharge Plan: SNF    Current Diagnoses: Patient Active Problem List   Diagnosis Date Noted  . 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 Fecal impaction in rectum ([place]) 02/27/2017  . Incontinence of bowel 10/18/2016  . Hyperlipidemia 10/18/2016  . Dementia 10/18/2016  . Pressure injury of skin 07/01/2016  . Dehydration   . Weakness generalized 06/30/2016  . Weakness 06/30/2016  . Acute cystitis without hematuria   . Anemia of chronic disease 01/03/2016  . Pre-diabetes 08/13/2015  . Bradycardia 08/03/2015  . Orthostasis 08/03/2015  . Syncope 08/02/2015  . Chronic kidney disease 08/02/2015  . Lower urinary tract infection   . Generalized weakness 07/31/2015  . CKD (chronic kidney disease) stage 3, GFR 30-59 ml/min (HCC) 07/31/2015  . Chronic anemia 07/31/2015  . Pressure ulcer 02/12/2015  . Acute pyelonephritis 02/10/2015  . Constipation 02/10/2015  . Lactic acidosis 02/10/2015  . Acute encephalopathy 02/10/2015  . Malnutrition of moderate degree ([place]) 02/10/2015  . Severe sepsis ([place])   . Sepsis secondary to UTI ([place])   . Poor appetite 12/10/2014  . Acute renal failure syndrome ([place])   . Acute on chronic renal failure ([place]) 08/27/2014  . Essential hypertension 08/27/2014  . Lower urinary tract infectious disease 08/26/2014  . SIRS (systemic inflammatory response syndrome)  ([place]) 08/26/2014  . Acute renal failure ([place]) 08/26/2014  . Nausea and vomiting 08/26/2014  . Diarrhea 08/26/2014  . Coronary artery disease   . Malignant hypertension 03/23/2014  . Uncontrolled hypertension 03/23/2014  . 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 Hypokalemia 03/23/2014  . Anemia, iron deficiency 03/23/2014  . Severe protein-calorie malnutrition ([place]) 03/23/2014  . Hypertension 03/22/2014    Orientation RESPIRATION BLADDER Height & Weight     Self  Normal Incontinent Weight: 95 lb 0.3 oz (43.1 kg) Height:  5\' 2"  (157.5 cm)  BEHAVIORAL SYMPTOMS/MOOD NEUROLOGICAL BOWEL NUTRITION STATUS  Other (Comment) (no behaviors)     Diet (Heart)  AMBULATORY STATUS COMMUNICATION OF NEEDS Skin   Total Care Verbally PU Stage and Appropriate Care (PressureInjuryDeepTissueInjury-Purpleormaroonlocalizedareaofdiscoloredintactskinorblood-filledblisterduetodamageofunderlyingsofttissuefrompressureand/orshear. Location: Mid Sacrum )   PU Stage 2 Dressing: No Dressing (PressureInjuryStageII-Partialthicknesslossofdermispresentingasashallowopenulcerwithared,pinkwoundbedwithoutslough. Left Buttocks)                   Personal Care Assistance Level of Assistance  Bathing, Feeding, Dressing Bathing Assistance: Maximum assistance Feeding assistance: Maximum assistance Dressing Assistance: Maximum assistance     Functional Limitations Info             SPECIAL CARE FACTORS FREQUENCY  PT (By licensed PT), OT (By licensed OT)     PT Frequency: Min 2x/week              Contractures Contractures Info: Not present    Additional Factors Info  Code Status, Allergies Code Status Info: DNR Allergies Info: NKA  Current Medications (07/12/2017):  This is the current hospital active medication list Current Facility-Administered Medications  Medication Dose Route Frequency Provider Last Rate  Last Dose  . acetaminophen (TYLENOL) tablet 650 mg  650 mg Oral Q6H PRN Emokpae, Ejiroghene E, MD       Or  . acetaminophen (TYLENOL) suppository 650 mg  650 mg Rectal Q6H PRN Emokpae, Ejiroghene E, MD      . atorvastatin (LIPITOR) tablet 10 mg  10 mg Oral Daily Emokpae, Ejiroghene E, MD   10 mg at 07/12/17 0930  . docusate sodium (COLACE) capsule 100 mg  100 mg Oral BID Emokpae, Ejiroghene E, MD   100 mg at 07/12/17 0933  . enoxaparin (LOVENOX) injection 30 mg  30 mg Subcutaneous Q24H Absher, Randall K, RPH   30 mg at 07/12/17 0113  . feeding supplement (BOOST / RESOURCE BREEZE) liquid 1 Container  1 Container Oral BID BM Emokpae, Ejiroghene E, MD   1 Container at 07/12/17 0935  . lactated ringers infusion   Intravenous Continuous Elodia Florence., MD 75 mL/hr at 07/12/17 [phone]    . multivitamin with minerals tablet 1 tablet  1 tablet Oral Daily Elodia Florence., MD   1 tablet at 07/12/17 [phone]  . ondansetron (ZOFRAN) tablet 4 mg  4 mg Oral Q6H PRN Emokpae, Ejiroghene E, MD       Or  . ondansetron (ZOFRAN) injection 4 mg  4 mg Intravenous Q6H PRN Emokpae, Ejiroghene E, MD      . polyethylene glycol (MIRALAX / GLYCOLAX) packet 17 g  17 g Oral Daily Emokpae, Ejiroghene E, MD   17 g at 07/12/17 0935     Discharge Medications: Please see discharge summary for a list of discharge medications.  Relevant Imaging Results:  Relevant Lab Results:   Additional Information SS # [id number]  Burnis Medin, LCSW

## 2017-07-12 NOTE — Progress Notes (Addendum)
INFECTIOUS DISEASE PROGRESS NOTE  ID: Ashlee Mack is a 76 y.o. female with  Principal Problem:   Sepsis secondary to UTI [facility]) Active Problems:   Chronic venous stasis dermatitis of both lower extremities   Anemia, iron deficiency   Severe protein-calorie malnutrition (HCC)   Essential hypertension   Pressure ulcer   CKD (chronic kidney disease) stage 3, GFR 30-59 ml/min (HCC)   Dementia  Subjective: Does not vocalize  Abtx:  Anti-infectives    Start     Dose/Rate Route Frequency Ordered Stop   07/10/17 1000  fluconazole (DIFLUCAN) tablet 100 mg  Status:  Discontinued     100 mg Oral Daily 07/09/17 1223 07/11/17 1820   07/09/17 1800  cefTRIAXone (ROCEPHIN) 1 g in dextrose 5 % 50 mL IVPB  Status:  Discontinued     1 g 100 mL/hr over 30 Minutes Intravenous Every 24 hours 07/08/17 2114 07/11/17 1820   07/09/17 1400  fluconazole (DIFLUCAN) tablet 200 mg  Status:  Discontinued     200 mg Oral Daily 07/09/17 1212 07/09/17 1223   07/09/17 1400  fluconazole (DIFLUCAN) tablet 200 mg     200 mg Oral  Once 07/09/17 1223 07/09/17 1520   07/08/17 1815  cefTRIAXone (ROCEPHIN) 1 g in dextrose 5 % 50 mL IVPB     1 g 100 mL/hr over 30 Minutes Intravenous  Once 07/08/17 1808 07/08/17 2010      Medications:  Scheduled: . atorvastatin  10 mg Oral Daily  . docusate sodium  100 mg Oral BID  . enoxaparin (LOVENOX) injection  30 mg Subcutaneous Q24H  . feeding supplement  1 Container Oral BID BM  . multivitamin with minerals  1 tablet Oral Daily  . polyethylene glycol  17 g Oral Daily    Objective: Vital signs in last 24 hours: Temp:  [98.7 F (37.1 C)-99.5 F (37.5 C)] 98.7 F (37.1 C) (10/30 0842) Pulse Rate:  [91-98] 94 (10/30 0842) Resp:  [17-19] 19 (10/30 0842) BP: (113-151)/(69-79) 113/71 (10/30 0842) SpO2:  [93 %-95 %] 94 % (10/30 0842)   General appearance: alert and no distress Resp: clear to auscultation bilaterally Cardio: regular rate and rhythm GI: normal  findings: bowel sounds normal and soft, non-tender  Lab Results  Recent Labs  07/11/17 0434 07/12/17 0430  WBC 24.9* 24.5*  HGB 7.8* 7.9*  HCT 25.5* 25.5*  NA 139 139  K 3.6 3.9  CL 103 105  CO2 24 24  BUN 11 14  CREATININE 0.98 1.01*   Liver Panel No results for input(s): PROT, ALBUMIN, AST, ALT, ALKPHOS, BILITOT, BILIDIR, IBILI in the last 72 hours. Sedimentation Rate No results for input(s): ESRSEDRATE in the last 72 hours. C-Reactive Protein No results for input(s): CRP in the last 72 hours.  Microbiology: Recent Results (from the past 240 hour(s))  Culture, blood (Routine x 2)     Status: None (Preliminary result)   Collection Time: 07/08/17  3:48 PM  Result Value Ref Range Status   Specimen Description BLOOD RIGHT FOREARM  Final   Special Requests   Final    BOTTLES DRAWN AEROBIC AND ANAEROBIC Blood Culture adequate volume   Culture  Setup Time   Final    GRAM POSITIVE COCCI IN CHAINS AEROBIC BOTTLE ONLY CRITICAL RESULT CALLED TO, READ BACK BY AND VERIFIED WITH: Minette Brine [id number] MLM Performed at [facility], [place] [address] [place], [place] [address]    Culture GRAM POSITIVE COCCI IN CHAINS  Final   Report Status PENDING  Incomplete  Blood Culture ID Panel (Reflexed)     Status: None   Collection Time: 07/08/17  3:48 PM  Result Value Ref Range Status   Enterococcus species NOT DETECTED NOT DETECTED Final   Listeria monocytogenes NOT DETECTED NOT DETECTED Final   Staphylococcus species NOT DETECTED NOT DETECTED Final   Staphylococcus aureus NOT DETECTED NOT DETECTED Final   Streptococcus species NOT DETECTED NOT DETECTED Final   Streptococcus agalactiae NOT DETECTED NOT DETECTED Final   Streptococcus pneumoniae NOT DETECTED NOT DETECTED Final   Streptococcus pyogenes NOT DETECTED NOT DETECTED Final   Acinetobacter baumannii NOT DETECTED NOT DETECTED Final   Enterobacteriaceae species NOT DETECTED NOT DETECTED Final   Enterobacter cloacae  complex NOT DETECTED NOT DETECTED Final   Escherichia coli NOT DETECTED NOT DETECTED Final   Klebsiella oxytoca NOT DETECTED NOT DETECTED Final   Klebsiella pneumoniae NOT DETECTED NOT DETECTED Final   Proteus species NOT DETECTED NOT DETECTED Final   Serratia marcescens NOT DETECTED NOT DETECTED Final   Haemophilus influenzae NOT DETECTED NOT DETECTED Final   Neisseria meningitidis NOT DETECTED NOT DETECTED Final   Pseudomonas aeruginosa NOT DETECTED NOT DETECTED Final   Candida albicans NOT DETECTED NOT DETECTED Final   Candida glabrata NOT DETECTED NOT DETECTED Final   Candida krusei NOT DETECTED NOT DETECTED Final   Candida parapsilosis NOT DETECTED NOT DETECTED Final   Candida tropicalis NOT DETECTED NOT DETECTED Final    Comment: Performed at [facility], [address], [place], [place] [address]  Culture, blood (Routine x 2)     Status: None (Preliminary result)   Collection Time: 07/08/17  3:49 PM  Result Value Ref Range Status   Specimen Description BLOOD RIGHT ANTECUBITAL  Final   Special Requests   Final    BOTTLES DRAWN AEROBIC AND ANAEROBIC Blood Culture adequate volume   Culture   Final    NO GROWTH 3 DAYS Performed at [facility], [place] [address] [place], [place] [address]    Report Status PENDING  Incomplete  Urine Culture     Status: Abnormal   Collection Time: 07/08/17  6:40 PM  Result Value Ref Range Status   Specimen Description URINE, RANDOM  Final   Special Requests NONE  Final   Culture MULTIPLE SPECIES PRESENT, SUGGEST RECOLLECTION (A)  Final   Report Status 07/10/2017 FINAL  Final    Studies/Results: No results found.   Assessment/Plan: Dementia Urine colonization +BCx contaminant? Leukocytosis Protein calorie malnutrition- improved (alb 3.8)  Total days of antibiotics: off (4 d ceftriaxone. 3 days flucon)     await ID of BCx UCx is polymicrobial.  Will watch, f/u her BCx Spoke with rn, examined pt- no decubitus.  Continue to  hold anbx      Bobby Rumpf MD, FACP Infectious Diseases (pager) [phone] [email or web address] 07/12/2017, 11:23 AM  LOS: 4 days

## 2017-07-13 ENCOUNTER — Inpatient Hospital Stay (HOSPITAL_COMMUNITY): Payer: Medicare Other

## 2017-07-13 ENCOUNTER — Encounter (HOSPITAL_COMMUNITY): Payer: Self-pay | Admitting: Radiology

## 2017-07-13 LAB — CBC
HEMATOCRIT: 26.3 % — AB (ref 36.0–46.0)
Hemoglobin: 8.3 g/dL — ABNORMAL LOW (ref 12.0–15.0)
MCH: 23.1 pg — AB (ref 26.0–34.0)
MCHC: 31.6 g/dL (ref 30.0–36.0)
MCV: 73.3 fL — AB (ref 78.0–100.0)
PLATELETS: 524 10*3/uL — AB (ref 150–400)
RBC: 3.59 MIL/uL — AB (ref 3.87–5.11)
RDW: 32.9 % — ABNORMAL HIGH (ref 11.5–15.5)
WBC: 24.8 10*3/uL — AB (ref 4.0–10.5)

## 2017-07-13 LAB — CULTURE, BLOOD (ROUTINE X 2)
Culture: NO GROWTH
Special Requests: ADEQUATE

## 2017-07-13 MED ORDER — IOPAMIDOL (ISOVUE-300) INJECTION 61%: 15.0000 mL | Freq: Two times a day (BID) | INTRAVENOUS | Status: DC | PRN

## 2017-07-13 MED ORDER — IOPAMIDOL (ISOVUE-300) INJECTION 61%
INTRAVENOUS | Status: AC
  Filled 2017-07-13: qty 30

## 2017-07-13 MED ORDER — IOPAMIDOL (ISOVUE-300) INJECTION 61%
100.0000 mL | Freq: Once | INTRAVENOUS | Status: AC | PRN
  Administered 2017-07-13: 100 mL via INTRAVENOUS

## 2017-07-13 MED ORDER — IOPAMIDOL (ISOVUE-300) INJECTION 61%
INTRAVENOUS | Status: AC
  Administered 2017-07-13: 15 mL
  Filled 2017-07-13: qty 100

## 2017-07-13 MED ORDER — LACTATED RINGERS IV SOLN
INTRAVENOUS | Status: AC
  Administered 2017-07-13 (×2): via INTRAVENOUS

## 2017-07-13 NOTE — Progress Notes (Signed)
INFECTIOUS DISEASE PROGRESS NOTE  ID: Ashlee Mack is a 76 y.o. female with  Principal Problem:   Sepsis secondary to UTI [facility]) Active Problems:   Chronic venous stasis dermatitis of both lower extremities   Anemia, iron deficiency   Severe protein-calorie malnutrition (HCC)   Essential hypertension   Pressure ulcer   CKD (chronic kidney disease) stage 3, GFR 30-59 ml/min (HCC)   Dementia  Subjective: No complaints.   Abtx:  Anti-infectives    Start     Dose/Rate Route Frequency Ordered Stop   07/10/17 1000  fluconazole (DIFLUCAN) tablet 100 mg  Status:  Discontinued     100 mg Oral Daily 07/09/17 1223 07/11/17 1820   07/09/17 1800  cefTRIAXone (ROCEPHIN) 1 g in dextrose 5 % 50 mL IVPB  Status:  Discontinued     1 g 100 mL/hr over 30 Minutes Intravenous Every 24 hours 07/08/17 2114 07/11/17 1820   07/09/17 1400  fluconazole (DIFLUCAN) tablet 200 mg  Status:  Discontinued     200 mg Oral Daily 07/09/17 1212 07/09/17 1223   07/09/17 1400  fluconazole (DIFLUCAN) tablet 200 mg     200 mg Oral  Once 07/09/17 1223 07/09/17 1520   07/08/17 1815  cefTRIAXone (ROCEPHIN) 1 g in dextrose 5 % 50 mL IVPB     1 g 100 mL/hr over 30 Minutes Intravenous  Once 07/08/17 1808 07/08/17 2010      Medications:  Scheduled: . iopamidol      . atorvastatin  10 mg Oral Daily  . enoxaparin (LOVENOX) injection  30 mg Subcutaneous Q24H  . feeding supplement  1 Container Oral BID BM  . multivitamin with minerals  1 tablet Oral Daily  . polyethylene glycol  17 g Oral Daily    Objective: Vital signs in last 24 hours: Temp:  [97.7 F (36.5 C)-99 F (37.2 C)] 98.1 F (36.7 C) (10/31 1448) Pulse Rate:  [90-99] 99 (10/31 1448) Resp:  [18] 18 (10/31 1448) BP: (124-130)/(66-70) 130/70 (10/31 1448) SpO2:  [96 %-100 %] 100 % (10/31 1448)   General appearance: alert, cooperative, fatigued, no distress and kyphotic Resp: clear to auscultation bilaterally Cardio: regular rate and rhythm GI:  normal findings: bowel sounds normal and soft, non-tender Extremities: edema none  Lab Results  Recent Labs  07/11/17 0434 07/12/17 0430 07/13/17 0425  WBC 24.9* 24.5* 24.8*  HGB 7.8* 7.9* 8.3*  HCT 25.5* 25.5* 26.3*  NA 139 139  --   K 3.6 3.9  --   CL 103 105  --   CO2 24 24  --   BUN 11 14  --   CREATININE 0.98 1.01*  --    Liver Panel No results for input(s): PROT, ALBUMIN, AST, ALT, ALKPHOS, BILITOT, BILIDIR, IBILI in the last 72 hours. Sedimentation Rate No results for input(s): ESRSEDRATE in the last 72 hours. C-Reactive Protein No results for input(s): CRP in the last 72 hours.  Microbiology: Recent Results (from the past 240 hour(s))  Culture, blood (Routine x 2)     Status: None (Preliminary result)   Collection Time: 07/08/17  3:48 PM  Result Value Ref Range Status   Specimen Description BLOOD RIGHT FOREARM  Final   Special Requests   Final    BOTTLES DRAWN AEROBIC AND ANAEROBIC Blood Culture adequate volume   Culture  Setup Time   Final    GRAM POSITIVE COCCI IN CHAINS AEROBIC BOTTLE ONLY CRITICAL RESULT CALLED TO, READ BACK BY AND VERIFIED WITH: [place] M  RENZ [id number] MLM Performed at [facility], [place] [address] [place], [place] [address]    Culture GRAM POSITIVE COCCI IN CHAINS  Final   Report Status PENDING  Incomplete  Blood Culture ID Panel (Reflexed)     Status: None   Collection Time: 07/08/17  3:48 PM  Result Value Ref Range Status   Enterococcus species NOT DETECTED NOT DETECTED Final   Listeria monocytogenes NOT DETECTED NOT DETECTED Final   Staphylococcus species NOT DETECTED NOT DETECTED Final   Staphylococcus aureus NOT DETECTED NOT DETECTED Final   Streptococcus species NOT DETECTED NOT DETECTED Final   Streptococcus agalactiae NOT DETECTED NOT DETECTED Final   Streptococcus pneumoniae NOT DETECTED NOT DETECTED Final   Streptococcus pyogenes NOT DETECTED NOT DETECTED Final   Acinetobacter baumannii NOT DETECTED NOT DETECTED  Final   Enterobacteriaceae species NOT DETECTED NOT DETECTED Final   Enterobacter cloacae complex NOT DETECTED NOT DETECTED Final   Escherichia coli NOT DETECTED NOT DETECTED Final   Klebsiella oxytoca NOT DETECTED NOT DETECTED Final   Klebsiella pneumoniae NOT DETECTED NOT DETECTED Final   Proteus species NOT DETECTED NOT DETECTED Final   Serratia marcescens NOT DETECTED NOT DETECTED Final   Haemophilus influenzae NOT DETECTED NOT DETECTED Final   Neisseria meningitidis NOT DETECTED NOT DETECTED Final   Pseudomonas aeruginosa NOT DETECTED NOT DETECTED Final   Candida albicans NOT DETECTED NOT DETECTED Final   Candida glabrata NOT DETECTED NOT DETECTED Final   Candida krusei NOT DETECTED NOT DETECTED Final   Candida parapsilosis NOT DETECTED NOT DETECTED Final   Candida tropicalis NOT DETECTED NOT DETECTED Final    Comment: Performed at [facility], [place] [address] [place], [place] [address]  Culture, blood (Routine x 2)     Status: None   Collection Time: 07/08/17  3:49 PM  Result Value Ref Range Status   Specimen Description BLOOD RIGHT ANTECUBITAL  Final   Special Requests   Final    BOTTLES DRAWN AEROBIC AND ANAEROBIC Blood Culture adequate volume   Culture   Final    NO GROWTH 5 DAYS Performed at [facility], [place] [address] [place], [place] [address]    Report Status 07/13/2017 FINAL  Final  Urine Culture     Status: Abnormal   Collection Time: 07/08/17  6:40 PM  Result Value Ref Range Status   Specimen Description URINE, RANDOM  Final   Special Requests NONE  Final   Culture MULTIPLE SPECIES PRESENT, SUGGEST RECOLLECTION (A)  Final   Report Status 07/10/2017 FINAL  Final    Studies/Results: No results found.   Assessment/Plan: Dementia Urine colonization +BCx contaminant? Leukocytosis   Total days of antibiotics: off (4 d ceftriaxone. 3 days flucon)                                      await ID of BCx, still pending Will watch, f/u her BCx     Continue to hold anbx Would repeat her BCx with her persistent leukocytosis Please let us know if any changes, available as needed.          Bobby Rumpf MD, FACP Infectious Diseases (pager) [phone] [email or web address] 07/13/2017, 3:04 PM  LOS: 5 days

## 2017-07-13 NOTE — Clinical Social Work Placement (Signed)
   CLINICAL SOCIAL WORK PLACEMENT  NOTE  Date:  07/13/2017  Patient Details  Name: Ashlee Mack MRN: [id number] Date of Birth: 1941/07/09  Clinical Social Work is seeking post-discharge placement for this patient at the [place] level of care (*CSW will initial, date and re-position this form in  chart as items are completed):  Yes   Patient/family provided with [place] Work Department's list of facilities offering this level of care within the geographic area requested by the patient (or if unable, by the patient's family).  Yes   Patient/family informed of their freedom to choose among providers that offer the needed level of care, that participate in Medicare, Medicaid or managed care program needed by the patient, have an available bed and are willing to accept the patient.  Yes   Patient/family informed of [facility]'s ownership interest in [facility] and [facility], as well as of the fact that they are under no obligation to receive care at these facilities.  PASRR submitted to EDS on       PASRR number received on       Existing PASRR number confirmed on 07/12/17     FL2 transmitted to all facilities in geographic area requested by pt/family on 07/12/17     FL2 transmitted to all facilities within larger geographic area on       Patient informed that his/her managed care company has contracts with or will negotiate with certain facilities, including the following:        Yes   Patient/family informed of bed offers received.  Patient chooses bed at [facility]     Physician recommends and patient chooses bed at      Patient to be transferred to   on  .  Patient to be transferred to facility by       Patient family notified on   of transfer.  Name of family member notified:        PHYSICIAN       Additional Comment:    _______________________________________________ Burnis Medin, LCSW 07/13/2017, 4:27  PM

## 2017-07-13 NOTE — Progress Notes (Signed)
OT Cancellation Note  Patient Details Name: Ashlee Mack MRN: [id number] DOB: February 19, 1941   Cancelled Treatment:    Reason Eval/Treat Not Completed: Patient declined, no reason specified  [name] 07/13/2017, 1:54 PM  Lesle Chris, OTR/L [phone] 07/13/2017

## 2017-07-13 NOTE — Progress Notes (Signed)
[facility] PROGRESS NOTE  Otillia Cordone ZOX:[id number] DOB: 1941/08/26 DOA: 07/08/2017 PCP: Arnoldo Morale, MD  Brief summary   Jimma Ortman a 76 y.o.femalewith medical history significant for- HTN, coronary artery disease, chronic stasis dermatitis, recurrent urinary tract infection, dementia. Patient was brought to the ED today with her spouse due to complaints of weakness, lethargy. Baseline patient is able to ambulate with a walker,but has not being able to do so for 2 days. Also notes some confusion over the past 2 days Patient denies abdominal pain, dysuria, enies flank pain or fever, but Spouse notes chills about 5 days ago. Patient denies difficulty breathing or cough, no chest pain, no diarrhea or vomiting, no photophobia or neck Stiffness or headache.  Assessment & Plan:   Principal Problem:   Sepsis secondary to UTI [facility]) Active Problems:   Chronic venous stasis dermatitis of both lower extremities   Anemia, iron deficiency   Severe protein-calorie malnutrition (HCC)   Essential hypertension   Pressure ulcer   CKD (chronic kidney disease) stage 3, GFR 30-59 ml/min (HCC)   Dementia  Assessment/Plan:  Sepsis thought due to UTI - Leukocytosis initially improving, but now worse.  UA with large leukocytes, buddying yeast, RBC's, WBC's, but with multiple species growing. Blood cx with gram positive cocci in chains, but negative BCID.  1/2 growing gram positive cocci in chains. ID c/s yesterday for rising wbc count.  Awaiting final bcx.  Dc fluconazole and ceftriaxone with urine cx as above -abdominal tenderness on palpation, poor historian. will obtain ct abd/pelvis today  Confusion Difficulty with ambulation: Suspecting 2/2 above.  Head CT without acute findings, mild chronic ischemic microvascular disease and age related atrophic change.  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  OT noted she hadn't walked "in a long time" with progressive weakness, reportedly stays in recliner  and he stands her 2x/day to change diapers.  PT/OT recommending SNF. Placement pending  Severe protein calorie malnutrition. nutrition consulted. continue home feeding supplement  Thrombocytosis- platelets 695, chronically elevated. Hemoglobin low at 9.3- likely anemia of chronic disease,as reflected by 02/2017- ferritin elevated at 858, with low TIBC and normal serum iron.WBC mostly chronically elevated. possible myeloproliferative disease, follow-up as outpatient. not on antiplatelet agent at home  Anemia: Microcytic.   relatively stable. No s/s of acute bleeding.  Iron panel suggestive of AOCD.  stool hemoccult-pend  Hypertension- stable blood pressure, home medications Norvasc 10 daily. Hold antihypertensives for now   CKD stage 3: CrCl 36  DVT prophylaxis: lovenox Code Status: DNR Family Communication: husband in room  Disposition Plan: pending improvement, PT/OT eval-SNF  Consultants:   none  Procedures: (Don't include imaging studies which can be auto populated. Include things that cannot be auto populated i.e. Echo, Carotid and venous dopplers, Foley, Bipap, HD, tubes/drains, wound vac, central lines etc)  none  Antimicrobials: (specify start and planned stop date. Auto populated tables are space occupying and do not give end dates)  Ceftriaxone 10/26 - 10/29  Fluconazole 10/27 -  20/29   HPI/Subjective: Alert. No distress. Denies acute abdominal pains but tender on palpation. Persistent leukocytosis of unclear etiology. D/w her family, husband at the bedside. Will obtain ct abd/pelvis    Objective: Vitals:   07/12/17 2011 07/13/17 0514  BP: 126/66 124/70  Pulse: 90 96  Resp: 18 18  Temp: 99 F (37.2 C) 97.7 F (36.5 C)  SpO2: 96% 100%    Intake/Output Summary (Last 24 hours) at 07/13/17 1023 Last data filed at 07/13/17 8119  Gross  per 24 hour  Intake             1695 ml  Output             1200 ml  Net              495 ml   Filed Weights    07/08/17 1550 07/08/17 2243  Weight: 49.9 kg (110 lb) 43.1 kg (95 lb 0.3 oz)    Exam:   General:  alert  Cardiovascular: s1,s2 rrr  Respiratory: no wheezing   Abdomen: soft, diffuse tender. +BS  Musculoskeletal: no leg edema    Data Reviewed: Basic Metabolic Panel:  Recent Labs Lab 07/08/17 1553 07/09/17 0509 07/10/17 0507 07/11/17 0434 07/12/17 0430  NA 139 139 141 139 139  K 3.6 3.7 3.8 3.6 3.9  CL 105 108 109 103 105  CO2 23 20* 23 24 24   GLUCOSE 104* 90 92 88 89  BUN 15 11 12 11 14   CREATININE 1.09* 0.96 0.92 0.98 1.01*  CALCIUM 8.9 8.5* 9.0 8.7* 8.6*   Liver Function Tests:  Recent Labs Lab 07/08/17 1553  AST 37  ALT 12*  ALKPHOS 97  BILITOT 0.8  PROT 8.7*  ALBUMIN 3.8   No results for input(s): LIPASE, AMYLASE in the last 168 hours. No results for input(s): AMMONIA in the last 168 hours. CBC:  Recent Labs Lab 07/08/17 1553 07/09/17 0509 07/10/17 0507 07/11/17 0434 07/12/17 0430 07/13/17 0425  WBC 27.7* 17.1* 23.6* 24.9* 24.5* 24.8*  NEUTROABS 21.1*  --   --   --   --   --   HGB 8.9* 10.8* 8.0* 7.8* 7.9* 8.3*  HCT 28.3* 33.6* 25.8* 25.5* 25.5* 26.3*  MCV 73.7* 73.7* 72.9* 73.1* 73.9* 73.3*  PLT 695* 485* 561* 553* 545* 524*   Cardiac Enzymes: No results for input(s): CKTOTAL, CKMB, CKMBINDEX, TROPONINI in the last 168 hours. BNP (last 3 results) No results for input(s): BNP in the last 8760 hours.  ProBNP (last 3 results) No results for input(s): PROBNP in the last 8760 hours.  CBG:  Recent Labs Lab 07/08/17 1633  GLUCAP 90    Recent Results (from the past 240 hour(s))  Culture, blood (Routine x 2)     Status: None (Preliminary result)   Collection Time: 07/08/17  3:48 PM  Result Value Ref Range Status   Specimen Description BLOOD RIGHT FOREARM  Final   Special Requests   Final    BOTTLES DRAWN AEROBIC AND ANAEROBIC Blood Culture adequate volume   Culture  Setup Time   Final    GRAM POSITIVE COCCI IN CHAINS AEROBIC  BOTTLE ONLY CRITICAL RESULT CALLED TO, READ BACK BY AND VERIFIED WITH: Minette Brine [id number] MLM Performed at [facility], [address], [place], [place] [address]    Culture GRAM POSITIVE COCCI IN CHAINS  Final   Report Status PENDING  Incomplete  Blood Culture ID Panel (Reflexed)     Status: None   Collection Time: 07/08/17  3:48 PM  Result Value Ref Range Status   Enterococcus species NOT DETECTED NOT DETECTED Final   Listeria monocytogenes NOT DETECTED NOT DETECTED Final   Staphylococcus species NOT DETECTED NOT DETECTED Final   Staphylococcus aureus NOT DETECTED NOT DETECTED Final   Streptococcus species NOT DETECTED NOT DETECTED Final   Streptococcus agalactiae NOT DETECTED NOT DETECTED Final   Streptococcus pneumoniae NOT DETECTED NOT DETECTED Final   Streptococcus pyogenes NOT DETECTED NOT DETECTED Final  Acinetobacter baumannii NOT DETECTED NOT DETECTED Final   Enterobacteriaceae species NOT DETECTED NOT DETECTED Final   Enterobacter cloacae complex NOT DETECTED NOT DETECTED Final   Escherichia coli NOT DETECTED NOT DETECTED Final   Klebsiella oxytoca NOT DETECTED NOT DETECTED Final   Klebsiella pneumoniae NOT DETECTED NOT DETECTED Final   Proteus species NOT DETECTED NOT DETECTED Final   Serratia marcescens NOT DETECTED NOT DETECTED Final   Haemophilus influenzae NOT DETECTED NOT DETECTED Final   Neisseria meningitidis NOT DETECTED NOT DETECTED Final   Pseudomonas aeruginosa NOT DETECTED NOT DETECTED Final   Candida albicans NOT DETECTED NOT DETECTED Final   Candida glabrata NOT DETECTED NOT DETECTED Final   Candida krusei NOT DETECTED NOT DETECTED Final   Candida parapsilosis NOT DETECTED NOT DETECTED Final   Candida tropicalis NOT DETECTED NOT DETECTED Final    Comment: Performed at [facility], [place] [address], [place], [place] [address]  Culture, blood (Routine x 2)     Status: None (Preliminary result)   Collection Time: 07/08/17  3:49 PM  Result  Value Ref Range Status   Specimen Description BLOOD RIGHT ANTECUBITAL  Final   Special Requests   Final    BOTTLES DRAWN AEROBIC AND ANAEROBIC Blood Culture adequate volume   Culture   Final    NO GROWTH 4 DAYS Performed at [facility], [place] [address] [place], [place] [address]    Report Status PENDING  Incomplete  Urine Culture     Status: Abnormal   Collection Time: 07/08/17  6:40 PM  Result Value Ref Range Status   Specimen Description URINE, RANDOM  Final   Special Requests NONE  Final   Culture MULTIPLE SPECIES PRESENT, SUGGEST RECOLLECTION (A)  Final   Report Status 07/10/2017 FINAL  Final     Studies: No results found.  Scheduled Meds: . atorvastatin  10 mg Oral Daily  . docusate sodium  100 mg Oral BID  . enoxaparin (LOVENOX) injection  30 mg Subcutaneous Q24H  . feeding supplement  1 Container Oral BID BM  . multivitamin with minerals  1 tablet Oral Daily  . polyethylene glycol  17 g Oral Daily   Continuous Infusions: . lactated ringers 75 mL/hr at 07/12/17 1937    Principal Problem:   Sepsis secondary to UTI [facility]) Active Problems:   Chronic venous stasis dermatitis of both lower extremities   Anemia, iron deficiency   Severe protein-calorie malnutrition (HCC)   Essential hypertension   Pressure ulcer   CKD (chronic kidney disease) stage 3, GFR 30-59 ml/min (HCC)   Dementia    Time spent: >35 minutes     Kinnie Feil  [facility] Pager [phone]. If 7PM-7AM, please contact night-coverage at [email or web address], password [facility] 07/13/2017, 10:23 AM  LOS: 5 days

## 2017-07-14 DIAGNOSIS — R8279 Other abnormal findings on microbiological examination of urine: Secondary | ICD-10-CM

## 2017-07-14 DIAGNOSIS — M40209 Unspecified kyphosis, site unspecified: Secondary | ICD-10-CM

## 2017-07-14 DIAGNOSIS — D509 Iron deficiency anemia, unspecified: Secondary | ICD-10-CM

## 2017-07-14 DIAGNOSIS — F0390 Unspecified dementia without behavioral disturbance: Secondary | ICD-10-CM

## 2017-07-14 DIAGNOSIS — R7881 Bacteremia: Secondary | ICD-10-CM

## 2017-07-14 DIAGNOSIS — K5641 Fecal impaction: Secondary | ICD-10-CM

## 2017-07-14 DIAGNOSIS — D72829 Elevated white blood cell count, unspecified: Secondary | ICD-10-CM

## 2017-07-14 LAB — CBC WITH DIFFERENTIAL/PLATELET
BASOS PCT: 0 %
Band Neutrophils: 0 %
Basophils Absolute: 0 10*3/uL (ref 0.0–0.1)
Blasts: 1 %
EOS PCT: 0 %
Eosinophils Absolute: 0 10*3/uL (ref 0.0–0.7)
HEMATOCRIT: 25.2 % — AB (ref 36.0–46.0)
Hemoglobin: 7.8 g/dL — ABNORMAL LOW (ref 12.0–15.0)
LYMPHS ABS: 3.3 10*3/uL (ref 0.7–4.0)
Lymphocytes Relative: 15 %
MCH: 22.9 pg — ABNORMAL LOW (ref 26.0–34.0)
MCHC: 31 g/dL (ref 30.0–36.0)
MCV: 74.1 fL — ABNORMAL LOW (ref 78.0–100.0)
METAMYELOCYTES PCT: 2 %
MONO ABS: 1.8 10*3/uL — AB (ref 0.1–1.0)
MONOS PCT: 8 %
Myelocytes: 10 %
NEUTROS PCT: 64 %
NRBC: 7 /100{WBCs} — AB
Neutro Abs: 16.6 10*3/uL — ABNORMAL HIGH (ref 1.7–7.7)
Platelets: 533 10*3/uL — ABNORMAL HIGH (ref 150–400)
Promyelocytes Absolute: 0 %
RBC: 3.4 MIL/uL — AB (ref 3.87–5.11)
RDW: 32.8 % — AB (ref 11.5–15.5)
WBC: 21.9 10*3/uL — AB (ref 4.0–10.5)

## 2017-07-14 MED ORDER — SORBITOL 70 % SOLN
960.0000 mL | TOPICAL_OIL | Freq: Once | ORAL | Status: AC
  Administered 2017-07-14: 960 mL via RECTAL
  Filled 2017-07-14: qty 473

## 2017-07-14 MED ORDER — SENNOSIDES-DOCUSATE SODIUM 8.6-50 MG PO TABS
1.0000 | ORAL_TABLET | Freq: Every day | ORAL | Status: DC
  Administered 2017-07-14: 1 via ORAL
  Filled 2017-07-14: qty 1

## 2017-07-14 MED ORDER — POLYETHYLENE GLYCOL 3350 17 G PO PACK
17.0000 g | PACK | Freq: Two times a day (BID) | ORAL | Status: DC
  Administered 2017-07-14 – 2017-07-15 (×2): 17 g via ORAL
  Filled 2017-07-14 (×2): qty 1

## 2017-07-14 NOTE — Progress Notes (Signed)
PT Cancellation Note  Patient Details Name: Ashlee Mack MRN: [id number] DOB: [birth year]   Cancelled Treatment:    Reason Eval/Treat Not Completed: Patient declined, no reason specified (pt refused exercises and mobility. She denied pain. She did not respond when asked why she declined PT. Pt continued to refuse after benefits of mobility explained. )   [name] 07/14/2017, 9:36 AM  [phone]

## 2017-07-14 NOTE — Progress Notes (Signed)
PROGRESS NOTE    Ashlee Mack  YHC:[id number] DOB: 1940/09/28 DOA: 07/08/2017 PCP: Arnoldo Morale, MD   Brief Narrative:  Ashlee Mack a 76 y.o.femalewith medical history significant for- HTN, coronary artery disease, chronic stasis dermatitis, recurrent urinary tract infection, dementia. Patient was brought to the ED today with her spouse due to complaints of weakness, lethargy. Baseline patient is able to ambulate with a walker,but has not being able to do so for 2 days. Also notes some confusion over the past 2 days Patient denies abdominal pain, dysuria, enies flank pain or fever, but Spouse notes chills about 5 days ago. Patient denies difficulty breathing or cough, no chest pain, no diarrhea or vomiting, no photophobia or neck Stiffness or headache.    Assessment & Plan:   Principal Problem:   Sepsis secondary to UTI [facility]) Active Problems:   Fecal impaction (HCC)   Chronic venous stasis dermatitis of both lower extremities   Anemia, iron deficiency   Severe protein-calorie malnutrition (HCC)   Essential hypertension   Pressure ulcer   CKD (chronic kidney disease) stage 3, GFR 30-59 ml/min (HCC)   Dementia   #1 sepsis secondary to UTI  patient was admitted with weakness and lethargy, urinalysis done consistent with a UTI. Patient was pancultured with blood cultures 1 out of 2 growing gram-positive cocci in chains likely contaminant as BC ID was negative. Urine cultures did show some budding yeast. Patient with a worsening leukocytosis and a such ID was consulted. Leukocytosis currently slowly trending back down. Antibiotics and antifungals were discontinued per ID recommendations. Will repeat blood cultures 2 again today. Continue to monitor of antibiotic therapy per ID recommendations. Follow.  #2 confusion/difficulty ambulating Likely secondary to problem #1 and possible fecal impaction. CT head negative for any acute abnormalities. Per OT note patient hadn't walked in a  long time with progressive weakness and reportedly stays in the recliner and stance about 2 times a day to change her diapers. PT/OT recommended skilled nursing facility.  #3 severe protein calorie malnutrition Patient has been seen by nutrition. Continue current nutritional supplementation.  #4 thrombocytosis Patient with chronically elevated platelet count. Hemoglobin currently at 7.8. Patient with a leukocytosis. Consent for possible myeloproliferative disease. Outpatient follow-up.  #5 microcytic anemia Stable. Follow H&H.  #6 hypertension Continue to hold antihypertensive medications. Blood pressure stable.  #7 fecal impaction Noted on CT abdomen and pelvis. Will give a smog enema. Increase MiraLAX to twice daily. Add Senokot S at bedtime for bowel regimen. Follow.  #8 chronic kidney disease stage III Stable. Follow.   DVT prophylaxis: Lovenox Code Status: DO NOT RESUSCITATE Family Communication updated patient and family at bedside. Disposition Plan: Likely skilled nursing facility hopefully in the next 24-48 hours.   Consultants:   ID Dr.Comer 07/11/2017  Procedures:   CT abdomen and pelvis 07/13/2017  CT head 07/08/2017  Chest x-ray 07/08/2017    Antimicrobials:   IV Rocephin 07/08/2017>>>> 07/11/2017  Fluconazole 07/09/2017>>>> 07/11/2017   Subjective: Patient laying in bed. No abdominal pain. No chest pain. No shortness of breath. Just had a bowel movement.  Objective: Vitals:   07/13/17 0514 07/13/17 1448 07/13/17 2037 07/14/17 0430  BP: 124/70 130/70 123/74 118/76  Pulse: 96 99 97 83  Resp: 18 18 20 20   Temp: 97.7 F (36.5 C) 98.1 F (36.7 C) 99.6 F (37.6 C) 99.1 F (37.3 C)  TempSrc: Axillary Axillary Oral Oral  SpO2: 100% 100% 96% 97%  Weight:      Height:  Intake/Output Summary (Last 24 hours) at 07/14/17 1301 Last data filed at 07/14/17 0640  Gross per 24 hour  Intake              390 ml  Output              700 ml  Net              -310 ml   Filed Weights   07/08/17 1550 07/08/17 2243  Weight: 49.9 kg (110 lb) 43.1 kg (95 lb 0.3 oz)    Examination:  General exam: Appears calm and comfortable  Respiratory system: Clear to auscultation. Respiratory effort normal. Cardiovascular system: S1 & S2 heard, RRR. No JVD, murmurs, rubs, gallops or clicks. No pedal edema. Gastrointestinal system: Abdomen is nondistended, soft and nontender. No organomegaly or masses felt. Normal bowel sounds heard. Central nervous system: Alert and oriented. No focal neurological deficits. Extremities: Symmetric 5 x 5 power. Skin: No rashes, lesions or ulcers Psychiatry: Judgement and insight appear normal. Mood & affect appropriate.     Data Reviewed: I have personally reviewed following labs and imaging studies  CBC:  Recent Labs Lab 07/08/17 1553  07/10/17 0507 07/11/17 0434 07/12/17 0430 07/13/17 0425 07/14/17 0418  WBC 27.7*  < > 23.6* 24.9* 24.5* 24.8* 21.9*  NEUTROABS 21.1*  --   --   --   --   --  16.6*  HGB 8.9*  < > 8.0* 7.8* 7.9* 8.3* 7.8*  HCT 28.3*  < > 25.8* 25.5* 25.5* 26.3* 25.2*  MCV 73.7*  < > 72.9* 73.1* 73.9* 73.3* 74.1*  PLT 695*  < > 561* 553* 545* 524* 533*  < > = values in this interval not displayed. Basic Metabolic Panel:  Recent Labs Lab 07/08/17 1553 07/09/17 0509 07/10/17 0507 07/11/17 0434 07/12/17 0430  NA 139 139 141 139 139  K 3.6 3.7 3.8 3.6 3.9  CL 105 108 109 103 105  CO2 23 20* 23 24 24   GLUCOSE 104* 90 92 88 89  BUN 15 11 12 11 14   CREATININE 1.09* 0.96 0.92 0.98 1.01*  CALCIUM 8.9 8.5* 9.0 8.7* 8.6*   GFR: Estimated Creatinine Clearance: 32.7 mL/min (A) (by C-G formula based on SCr of 1.01 mg/dL (H)). Liver Function Tests:  Recent Labs Lab 07/08/17 1553  AST 37  ALT 12*  ALKPHOS 97  BILITOT 0.8  PROT 8.7*  ALBUMIN 3.8   No results for input(s): LIPASE, AMYLASE in the last 168 hours. No results for input(s): AMMONIA in the last 168 hours. Coagulation  Profile:  Recent Labs Lab 07/08/17 1553  INR 1.14   Cardiac Enzymes: No results for input(s): CKTOTAL, CKMB, CKMBINDEX, TROPONINI in the last 168 hours. BNP (last 3 results) No results for input(s): PROBNP in the last 8760 hours. HbA1C: No results for input(s): HGBA1C in the last 72 hours. CBG:  Recent Labs Lab 07/08/17 1633  GLUCAP 90   Lipid Profile: No results for input(s): CHOL, HDL, LDLCALC, TRIG, CHOLHDL, LDLDIRECT in the last 72 hours. Thyroid Function Tests: No results for input(s): TSH, T4TOTAL, FREET4, T3FREE, THYROIDAB in the last 72 hours. Anemia Panel: No results for input(s): VITAMINB12, FOLATE, FERRITIN, TIBC, IRON, RETICCTPCT in the last 72 hours. Sepsis Labs:  Recent Labs Lab 07/08/17 1557  LATICACIDVEN 1.87    Recent Results (from the past 240 hour(s))  Culture, blood (Routine x 2)     Status: None (Preliminary result)   Collection Time: 07/08/17  3:48 PM  Result  Value Ref Range Status   Specimen Description BLOOD RIGHT FOREARM  Final   Special Requests   Final    BOTTLES DRAWN AEROBIC AND ANAEROBIC Blood Culture adequate volume   Culture  Setup Time   Final    GRAM POSITIVE COCCI IN CHAINS AEROBIC BOTTLE ONLY CRITICAL RESULT CALLED TO, READ BACK BY AND VERIFIED WITH: PHARMD M RENZ [id number] MLM    Culture   Final    GRAM POSITIVE COCCI IN CHAINS SENT TO [facility] FOR ID AND SUSCEPTIBILITY Performed at [facility], [place] [address] [place], [place] [address]    Report Status PENDING  Incomplete  Blood Culture ID Panel (Reflexed)     Status: None   Collection Time: 07/08/17  3:48 PM  Result Value Ref Range Status   Enterococcus species NOT DETECTED NOT DETECTED Final   Listeria monocytogenes NOT DETECTED NOT DETECTED Final   Staphylococcus species NOT DETECTED NOT DETECTED Final   Staphylococcus aureus NOT DETECTED NOT DETECTED Final   Streptococcus species NOT DETECTED NOT DETECTED Final   Streptococcus agalactiae NOT DETECTED NOT  DETECTED Final   Streptococcus pneumoniae NOT DETECTED NOT DETECTED Final   Streptococcus pyogenes NOT DETECTED NOT DETECTED Final   Acinetobacter baumannii NOT DETECTED NOT DETECTED Final   Enterobacteriaceae species NOT DETECTED NOT DETECTED Final   Enterobacter cloacae complex NOT DETECTED NOT DETECTED Final   Escherichia coli NOT DETECTED NOT DETECTED Final   Klebsiella oxytoca NOT DETECTED NOT DETECTED Final   Klebsiella pneumoniae NOT DETECTED NOT DETECTED Final   Proteus species NOT DETECTED NOT DETECTED Final   Serratia marcescens NOT DETECTED NOT DETECTED Final   Haemophilus influenzae NOT DETECTED NOT DETECTED Final   Neisseria meningitidis NOT DETECTED NOT DETECTED Final   Pseudomonas aeruginosa NOT DETECTED NOT DETECTED Final   Candida albicans NOT DETECTED NOT DETECTED Final   Candida glabrata NOT DETECTED NOT DETECTED Final   Candida krusei NOT DETECTED NOT DETECTED Final   Candida parapsilosis NOT DETECTED NOT DETECTED Final   Candida tropicalis NOT DETECTED NOT DETECTED Final    Comment: Performed at [facility], [place] [address] [place], [place] [address]  Culture, blood (Routine x 2)     Status: None   Collection Time: 07/08/17  3:49 PM  Result Value Ref Range Status   Specimen Description BLOOD RIGHT ANTECUBITAL  Final   Special Requests   Final    BOTTLES DRAWN AEROBIC AND ANAEROBIC Blood Culture adequate volume   Culture   Final    NO GROWTH 5 DAYS Performed at [facility], [place] [address] [place],  [address]    Report Status 07/13/2017 FINAL  Final  Urine Culture     Status: Abnormal   Collection Time: 07/08/17  6:40 PM  Result Value Ref Range Status   Specimen Description URINE, RANDOM  Final   Special Requests NONE  Final   Culture MULTIPLE SPECIES PRESENT, SUGGEST RECOLLECTION (A)  Final   Report Status 07/10/2017 FINAL  Final         Radiology Studies: Ct Abdomen Pelvis W Contrast  Result Date: 07/13/2017 CLINICAL DATA:   Abdominal pain EXAM: CT ABDOMEN AND PELVIS WITH CONTRAST TECHNIQUE: Multidetector CT imaging of the abdomen and pelvis was performed using the standard protocol following bolus administration of intravenous contrast. CONTRAST:  165mL ISOVUE-300 IOPAMIDOL (ISOVUE-300) INJECTION 61%, 13mL ISOVUE-300 IOPAMIDOL (ISOVUE-300) INJECTION 61% COMPARISON:  02/27/2017 FINDINGS: Lower chest: Minimal bibasilar atelectatic changes are noted. Hepatobiliary: Scattered cysts  are noted throughout the liver stable from the prior exam. Gallbladder is well distended with a few dependent gallstones. No biliary ductal dilatation is seen per Pancreas: Unremarkable. No pancreatic ductal dilatation or surrounding inflammatory changes. Spleen: Normal in size without focal abnormality. Adrenals/Urinary Tract: The adrenal glands are within normal limits bilaterally. Scattered renal cysts are again identified bilaterally similar to that seen on the prior exam. No obstructive changes are noted. No definitive calculi are seen. The bladder is incompletely distended. Some bladder wall thickening is noted which may be related to poor distention. Some trabeculation of the bladder wall is noted. Stomach/Bowel: Rectum remains distended with fecal material similar to that seen on the prior exam. Possibility of mild fecal impaction deserves consideration. No obstructive changes are seen. The appendix is not well visualized although no inflammatory changes to suggest appendicitis are noted. Vascular/Lymphatic: Aortic atherosclerosis. No enlarged abdominal or pelvic lymph nodes. Reproductive: Uterus is well visualized with some air within them endometrial cavity. This is of uncertain significance. This is new from the prior study. Other: No abdominal wall hernia or abnormality. No abdominopelvic ascites. Musculoskeletal: Degenerative changes of lumbar spine are noted. Scoliosis is noted stable in appearance from the prior study. IMPRESSION: Cholelithiasis  without complicating factors. Changes suggestive of rectal impaction similar to that seen on the prior exam. No proximal obstructive changes are seen. Bilateral renal cysts stable from the prior study. Bladder wall thickening is noted with increased trabeculation. Air within the endometrial cavity of uncertain significance. This has increased in the interval from the prior exam. Electronically Signed   By: Inez Catalina M.D.   On: 07/13/2017 16:09        Scheduled Meds: . atorvastatin  10 mg Oral Daily  . enoxaparin (LOVENOX) injection  30 mg Subcutaneous Q24H  . feeding supplement  1 Container Oral BID BM  . multivitamin with minerals  1 tablet Oral Daily  . polyethylene glycol  17 g Oral BID  . senna-docusate  1 tablet Oral QHS   Continuous Infusions: . lactated ringers 75 mL/hr at 07/13/17 2058     LOS: 6 days    Time spent: 50 mins    Jadarian Mckay, MD [facility] Pager [phone]  If 7PM-7AM, please contact night-coverage [email or web address] Password [email or web address] 07/14/2017, 1:01 PM

## 2017-07-14 NOTE — Progress Notes (Signed)
Subjective:  Pt demented   Antibiotics:  Anti-infectives    Start     Dose/Rate Route Frequency Ordered Stop   07/10/17 1000  fluconazole (DIFLUCAN) tablet 100 mg  Status:  Discontinued     100 mg Oral Daily 07/09/17 1223 07/11/17 1820   07/09/17 1800  cefTRIAXone (ROCEPHIN) 1 g in dextrose 5 % 50 mL IVPB  Status:  Discontinued     1 g 100 mL/hr over 30 Minutes Intravenous Every 24 hours 07/08/17 2114 07/11/17 1820   07/09/17 1400  fluconazole (DIFLUCAN) tablet 200 mg  Status:  Discontinued     200 mg Oral Daily 07/09/17 1212 07/09/17 1223   07/09/17 1400  fluconazole (DIFLUCAN) tablet 200 mg     200 mg Oral  Once 07/09/17 1223 07/09/17 1520   07/08/17 1815  cefTRIAXone (ROCEPHIN) 1 g in dextrose 5 % 50 mL IVPB     1 g 100 mL/hr over 30 Minutes Intravenous  Once 07/08/17 1808 07/08/17 2010      Medications: Scheduled Meds: . atorvastatin  10 mg Oral Daily  . enoxaparin (LOVENOX) injection  30 mg Subcutaneous Q24H  . feeding supplement  1 Container Oral BID BM  . multivitamin with minerals  1 tablet Oral Daily  . polyethylene glycol  17 g Oral BID  . senna-docusate  1 tablet Oral QHS   Continuous Infusions: PRN Meds:.acetaminophen **OR** acetaminophen, iopamidol, ondansetron **OR** ondansetron (ZOFRAN) IV    Objective: Weight change:   Intake/Output Summary (Last 24 hours) at 07/14/17 1506 Last data filed at 07/14/17 1300  Gross per 24 hour  Intake          1923.75 ml  Output              700 ml  Net          1223.75 ml   Blood pressure 118/76, pulse 83, temperature 99.1 F (37.3 C), temperature source Oral, resp. rate 20, height 5\' 2"  (1.575 m), weight 95 lb 0.3 oz (43.1 kg), SpO2 97 %. Temp:  [99.1 F (37.3 C)-99.6 F (37.6 C)] 99.1 F (37.3 C) (11/01 0430) Pulse Rate:  [83-97] 83 (11/01 0430) Resp:  [20] 20 (11/01 0430) BP: (118-123)/(74-76) 118/76 (11/01 0430) SpO2:  [96 %-97 %] 97 % (11/01 0430)  Physical Exam: General: Alert and awake,  oriented to person HEENT: anicteric sclerEOMI CVS regular rate, normal r,  no murmur rubs or gallops Chest: clear to auscultation bilaterally, no wheezing, rales or rhonchi Abdomen: soft nontender, nondistended, normal bowel sounds, Skin: no rashes Neuro: nonfocal  CBC:    BMET  Recent Labs  07/12/17 0430  NA 139  K 3.9  CL 105  CO2 24  GLUCOSE 89  BUN 14  CREATININE 1.01*  CALCIUM 8.6*     Liver Panel  No results for input(s): PROT, ALBUMIN, AST, ALT, ALKPHOS, BILITOT, BILIDIR, IBILI in the last 72 hours.     Sedimentation Rate No results for input(s): ESRSEDRATE in the last 72 hours. C-Reactive Protein No results for input(s): CRP in the last 72 hours.  Micro Results: Recent Results (from the past 720 hour(s))  Culture, blood (Routine x 2)     Status: None (Preliminary result)   Collection Time: 07/08/17  3:48 PM  Result Value Ref Range Status   Specimen Description BLOOD RIGHT FOREARM  Final   Special Requests   Final    BOTTLES DRAWN AEROBIC AND ANAEROBIC Blood Culture adequate volume  Culture  Setup Time   Final    GRAM POSITIVE COCCI IN CHAINS AEROBIC BOTTLE ONLY CRITICAL RESULT CALLED TO, READ BACK BY AND VERIFIED WITH: PHARMD M RENZ [id number] MLM    Culture   Final    GRAM POSITIVE COCCI IN CHAINS SENT TO [facility] FOR ID AND SUSCEPTIBILITY Performed at [facility], [address], [place], [place] [address]    Report Status PENDING  Incomplete  Blood Culture ID Panel (Reflexed)     Status: None   Collection Time: 07/08/17  3:48 PM  Result Value Ref Range Status   Enterococcus species NOT DETECTED NOT DETECTED Final   Listeria monocytogenes NOT DETECTED NOT DETECTED Final   Staphylococcus species NOT DETECTED NOT DETECTED Final   Staphylococcus aureus NOT DETECTED NOT DETECTED Final   Streptococcus species NOT DETECTED NOT DETECTED Final   Streptococcus agalactiae NOT DETECTED NOT DETECTED Final   Streptococcus pneumoniae NOT  DETECTED NOT DETECTED Final   Streptococcus pyogenes NOT DETECTED NOT DETECTED Final   Acinetobacter baumannii NOT DETECTED NOT DETECTED Final   Enterobacteriaceae species NOT DETECTED NOT DETECTED Final   Enterobacter cloacae complex NOT DETECTED NOT DETECTED Final   Escherichia coli NOT DETECTED NOT DETECTED Final   Klebsiella oxytoca NOT DETECTED NOT DETECTED Final   Klebsiella pneumoniae NOT DETECTED NOT DETECTED Final   Proteus species NOT DETECTED NOT DETECTED Final   Serratia marcescens NOT DETECTED NOT DETECTED Final   Haemophilus influenzae NOT DETECTED NOT DETECTED Final   Neisseria meningitidis NOT DETECTED NOT DETECTED Final   Pseudomonas aeruginosa NOT DETECTED NOT DETECTED Final   Candida albicans NOT DETECTED NOT DETECTED Final   Candida glabrata NOT DETECTED NOT DETECTED Final   Candida krusei NOT DETECTED NOT DETECTED Final   Candida parapsilosis NOT DETECTED NOT DETECTED Final   Candida tropicalis NOT DETECTED NOT DETECTED Final    Comment: Performed at [facility], [place] [address], [place], [place] [address]  Culture, blood (Routine x 2)     Status: None   Collection Time: 07/08/17  3:49 PM  Result Value Ref Range Status   Specimen Description BLOOD RIGHT ANTECUBITAL  Final   Special Requests   Final    BOTTLES DRAWN AEROBIC AND ANAEROBIC Blood Culture adequate volume   Culture   Final    NO GROWTH 5 DAYS Performed at [facility], [place] [address], [place], [place] [address]    Report Status 07/13/2017 FINAL  Final  Urine Culture     Status: Abnormal   Collection Time: 07/08/17  6:40 PM  Result Value Ref Range Status   Specimen Description URINE, RANDOM  Final   Special Requests NONE  Final   Culture MULTIPLE SPECIES PRESENT, SUGGEST RECOLLECTION (A)  Final   Report Status 07/10/2017 FINAL  Final    Studies/Results: Ct Abdomen Pelvis W Contrast  Result Date: 07/13/2017 CLINICAL DATA:  Abdominal pain EXAM: CT ABDOMEN AND PELVIS WITH CONTRAST  TECHNIQUE: Multidetector CT imaging of the abdomen and pelvis was performed using the standard protocol following bolus administration of intravenous contrast. CONTRAST:  167mL ISOVUE-300 IOPAMIDOL (ISOVUE-300) INJECTION 61%, 76mL ISOVUE-300 IOPAMIDOL (ISOVUE-300) INJECTION 61% COMPARISON:  02/27/2017 FINDINGS: Lower chest: Minimal bibasilar atelectatic changes are noted. Hepatobiliary: Scattered cysts are noted throughout the liver stable from the prior exam. Gallbladder is well distended with a few dependent gallstones. No biliary ductal dilatation is seen per Pancreas: Unremarkable. No pancreatic ductal dilatation or surrounding inflammatory changes. Spleen: Normal in size  without focal abnormality. Adrenals/Urinary Tract: The adrenal glands are within normal limits bilaterally. Scattered renal cysts are again identified bilaterally similar to that seen on the prior exam. No obstructive changes are noted. No definitive calculi are seen. The bladder is incompletely distended. Some bladder wall thickening is noted which may be related to poor distention. Some trabeculation of the bladder wall is noted. Stomach/Bowel: Rectum remains distended with fecal material similar to that seen on the prior exam. Possibility of mild fecal impaction deserves consideration. No obstructive changes are seen. The appendix is not well visualized although no inflammatory changes to suggest appendicitis are noted. Vascular/Lymphatic: Aortic atherosclerosis. No enlarged abdominal or pelvic lymph nodes. Reproductive: Uterus is well visualized with some air within them endometrial cavity. This is of uncertain significance. This is new from the prior study. Other: No abdominal wall hernia or abnormality. No abdominopelvic ascites. Musculoskeletal: Degenerative changes of lumbar spine are noted. Scoliosis is noted stable in appearance from the prior study. IMPRESSION: Cholelithiasis without complicating factors. Changes suggestive of rectal  impaction similar to that seen on the prior exam. No proximal obstructive changes are seen. Bilateral renal cysts stable from the prior study. Bladder wall thickening is noted with increased trabeculation. Air within the endometrial cavity of uncertain significance. This has increased in the interval from the prior exam. Electronically Signed   By: Inez Catalina M.D.   On: 07/13/2017 16:09      Assessment/Plan:  INTERVAL HISTORY:  Gram + organism sent to [facility]   Principal Problem:   Sepsis secondary to UTI [facility]) Active Problems:   Chronic venous stasis dermatitis of both lower extremities   Anemia, iron deficiency   Severe protein-calorie malnutrition (HCC)   Essential hypertension   Pressure ulcer   CKD (chronic kidney disease) stage 3, GFR 30-59 ml/min (HCC)   Fecal impaction (HCC)   Dementia    Stanley Helmuth is a 76 y.o. female with dementia admitted with leukocytosis and 3 of several episodes with admissions for weakness and confusion.  He is growing gram-positive organisms in 1 of 2 blood cultures that could not be identified by our lab use any of the techniques available to them.  It is very slow growing and they have sent it out to [facility] for further identification and susceptibility testing.  Patient is remained off antimicrobial therapy and I have repeated her blood cultures today.  Continue to monitor off antimicrobial therapy  I doubt that the data will be back anytime soon from [facility] but perhaps it may be back within the next week.  I am certainly hoping that it represents a contaminant rather than a true pathogen but I am unable to determine that at this time.  The fact that is growing only 1 culture is reassuring but knowing the identity of the organism would be helpful.  Repeat blood cultures having been on also can be informative.  I will sign off for now please call with further questions.   LOS: 6 days   Alcide Evener 07/14/2017, 3:06 PM

## 2017-07-15 DIAGNOSIS — E785 Hyperlipidemia, unspecified: Secondary | ICD-10-CM | POA: Diagnosis not present

## 2017-07-15 DIAGNOSIS — G308 Other Alzheimer's disease: Secondary | ICD-10-CM | POA: Diagnosis not present

## 2017-07-15 DIAGNOSIS — D72829 Elevated white blood cell count, unspecified: Secondary | ICD-10-CM

## 2017-07-15 DIAGNOSIS — F0280 Dementia in other diseases classified elsewhere without behavioral disturbance: Secondary | ICD-10-CM | POA: Diagnosis not present

## 2017-07-15 DIAGNOSIS — A419 Sepsis, unspecified organism: Secondary | ICD-10-CM | POA: Diagnosis not present

## 2017-07-15 DIAGNOSIS — N183 Chronic kidney disease, stage 3 (moderate): Secondary | ICD-10-CM | POA: Diagnosis not present

## 2017-07-15 DIAGNOSIS — I10 Essential (primary) hypertension: Secondary | ICD-10-CM | POA: Diagnosis not present

## 2017-07-15 DIAGNOSIS — E43 Unspecified severe protein-calorie malnutrition: Secondary | ICD-10-CM | POA: Diagnosis not present

## 2017-07-15 DIAGNOSIS — K5641 Fecal impaction: Secondary | ICD-10-CM | POA: Diagnosis not present

## 2017-07-15 DIAGNOSIS — D509 Iron deficiency anemia, unspecified: Secondary | ICD-10-CM | POA: Diagnosis not present

## 2017-07-15 DIAGNOSIS — M6281 Muscle weakness (generalized): Secondary | ICD-10-CM | POA: Diagnosis not present

## 2017-07-15 DIAGNOSIS — R262 Difficulty in walking, not elsewhere classified: Secondary | ICD-10-CM | POA: Diagnosis not present

## 2017-07-15 DIAGNOSIS — N390 Urinary tract infection, site not specified: Secondary | ICD-10-CM | POA: Diagnosis not present

## 2017-07-15 DIAGNOSIS — F0391 Unspecified dementia with behavioral disturbance: Secondary | ICD-10-CM | POA: Diagnosis not present

## 2017-07-15 DIAGNOSIS — I2510 Atherosclerotic heart disease of native coronary artery without angina pectoris: Secondary | ICD-10-CM | POA: Diagnosis not present

## 2017-07-15 LAB — CBC WITH DIFFERENTIAL/PLATELET
BASOS ABS: 0 10*3/uL (ref 0.0–0.1)
BLASTS: 2 %
Band Neutrophils: 27 %
Basophils Relative: 0 %
EOS ABS: 0 10*3/uL (ref 0.0–0.7)
Eosinophils Relative: 0 %
HCT: 25.2 % — ABNORMAL LOW (ref 36.0–46.0)
HEMOGLOBIN: 7.7 g/dL — AB (ref 12.0–15.0)
LYMPHS PCT: 18 %
Lymphs Abs: 3.8 10*3/uL (ref 0.7–4.0)
MCH: 22.8 pg — AB (ref 26.0–34.0)
MCHC: 30.6 g/dL (ref 30.0–36.0)
MCV: 74.8 fL — ABNORMAL LOW (ref 78.0–100.0)
MONO ABS: 1.3 10*3/uL — AB (ref 0.1–1.0)
MONOS PCT: 6 %
Metamyelocytes Relative: 7 %
Myelocytes: 10 %
NEUTROS PCT: 27 %
Neutro Abs: 15.8 10*3/uL — ABNORMAL HIGH (ref 1.7–7.7)
Other: 0 %
Promyelocytes Absolute: 3 %
RBC: 3.37 MIL/uL — ABNORMAL LOW (ref 3.87–5.11)
RDW: 33.3 % — ABNORMAL HIGH (ref 11.5–15.5)
WBC: 21.3 10*3/uL — ABNORMAL HIGH (ref 4.0–10.5)
nRBC: 0 /100 WBC

## 2017-07-15 LAB — BASIC METABOLIC PANEL
ANION GAP: 9 (ref 5–15)
BUN: 12 mg/dL (ref 6–20)
CO2: 25 mmol/L (ref 22–32)
Calcium: 8.3 mg/dL — ABNORMAL LOW (ref 8.9–10.3)
Chloride: 105 mmol/L (ref 101–111)
Creatinine, Ser: 1.03 mg/dL — ABNORMAL HIGH (ref 0.44–1.00)
GFR, EST NON AFRICAN AMERICAN: 52 mL/min — AB (ref >60)
GLUCOSE: 86 mg/dL (ref 65–99)
POTASSIUM: 3.5 mmol/L (ref 3.5–5.1)
Sodium: 139 mmol/L (ref 135–145)

## 2017-07-15 MED ORDER — ENOXAPARIN SODIUM 30 MG/0.3ML ~~LOC~~ SOLN: 30.0000 mg | SUBCUTANEOUS | Status: AC

## 2017-07-15 MED ORDER — POLYETHYLENE GLYCOL 3350 17 G PO PACK: 17.0000 g | PACK | Freq: Two times a day (BID) | ORAL | 0 refills | Status: AC

## 2017-07-15 MED ORDER — ADULT MULTIVITAMIN W/MINERALS CH: 1.0000 | ORAL_TABLET | Freq: Every day | ORAL | Status: AC

## 2017-07-15 MED ORDER — SENNOSIDES-DOCUSATE SODIUM 8.6-50 MG PO TABS: 1.0000 | ORAL_TABLET | Freq: Every day | ORAL | Status: AC

## 2017-07-15 NOTE — Clinical Social Work Placement (Signed)
Patient received and accepted bed offer at [facility]. Facility aware of patient's discharge and confirmed bed offer. PTAR contacted, patient's family notified. Patient's RN can call report to [phone], packet complete. CSW signing off, no other needs identified.  CLINICAL SOCIAL WORK PLACEMENT  NOTE  Date:  07/15/2017  Patient Details  Name: Ashlee Mack MRN: [id number] Date of Birth: Aug 04, 1941  Clinical Social Work is seeking post-discharge placement for this patient at the [place] level of care (*CSW will initial, date and re-position this form in  chart as items are completed):  Yes   Patient/family provided with [facility] Work Department's list of facilities offering this level of care within the geographic area requested by the patient (or if unable, by the patient's family).  Yes   Patient/family informed of their freedom to choose among providers that offer the needed level of care, that participate in Medicare, Medicaid or managed care program needed by the patient, have an available bed and are willing to accept the patient.  Yes   Patient/family informed of [place]'s ownership interest in [facility] and [facility], as well as of the fact that they are under no obligation to receive care at these facilities.  PASRR submitted to EDS on       PASRR number received on       Existing PASRR number confirmed on 07/12/17     FL2 transmitted to all facilities in geographic area requested by pt/family on 07/12/17     FL2 transmitted to all facilities within larger geographic area on       Patient informed that his/her managed care company has contracts with or will negotiate with certain facilities, including the following:        Yes   Patient/family informed of bed offers received.  Patient chooses bed at [facility]     Physician recommends and patient chooses bed at      Patient to be transferred to  [facility] on 07/15/17.  Patient to be transferred to facility by PTAR     Patient family notified on 07/15/17 of transfer.  Name of family member notified:  Braggi Whetsel     PHYSICIAN       Additional Comment:    _______________________________________________ Burnis Medin, LCSW 07/15/2017, 3:42 PM

## 2017-07-15 NOTE — Discharge Summary (Signed)
Physician Discharge Summary  Ashlee Mack:[id number] DOB: 01-23-1941 DOA: 07/08/2017  PCP: Arnoldo Morale, MD  Admit date: 07/08/2017 Discharge date: 07/15/2017  Time spent: 65 minutes  Recommendations for Outpatient Follow-up:  1. Patient will be discharged to skilled nursing facility. Patient will follow-up with M.D. at the skilled nursing facility. Patient will need a CBC with differential done in 1 week to follow-up on leukocytosis and hemoglobin. Patient also need a basic metabolic profile done in 1 week to follow-up on electrolytes and renal function.  Blood cultures drawn on 07/14/2017 will need to be followed up upon.    Discharge Diagnoses:  Principal Problem:   Sepsis secondary to UTI [facility]) Active Problems:   Fecal impaction (HCC)   Chronic venous stasis dermatitis of both lower extremities   Anemia, iron deficiency   Severe protein-calorie malnutrition (HCC)   Essential hypertension   Pressure ulcer   CKD (chronic kidney disease) stage 3, GFR 30-59 ml/min (HCC)   Dementia   Positive blood culture   Leucocytosis   Discharge Condition: Stable and improved  Diet recommendation: Heart healthy  Filed Weights   07/08/17 1550 07/08/17 2243  Weight: 49.9 kg (110 lb) 43.1 kg (95 lb 0.3 oz)    History of present illness:  Per Dr Ashlee Mack is a 76 y.o. female with medical history significant for- HTN, coronary artery disease, chronic stasis dermatitis, recurrent urinary tract infection, dementia. Patient was brought to the ED on day of admission, with her spouse due to complaints of weakness, lethargy. Baseline patient is able to ambulate with a walker,but has not being able to do so for 2 days. Also noted some confusion over the past 2 days Patient denied abdominal pain, dysuria, denied flank pain or fever, but Spouse noted chills about 5 days ago. Patient denied difficulty breathing or cough, no chest pain, no diarrhea or vomiting, no photophobia or neck  Stiffness or headache.  ED Course: temperature 98.2, mildly tachycardic up to 109, unremarkable BMP, WBC elevated at 27, mild elevated platelets 695, hemoglobin close to baseline at 8.9.chest x-ray 2 view- acute cardiopulmonary process, head CT mild chronic ischemic microvascular disease, small right central lacunar infarct. Lactic acid was unremarkable- uA showed large leukocytes. Patient was started on IV ceftriaxone in the ED, blood cultures were drawn.   Hospital Course:  #1 sepsis secondary to UTI  Patient was admitted with weakness and lethargy, urinalysis done consistent with a UTI. Patient was pancultured with blood cultures 1 out of 2 growing gram-positive cocci in chains likely contaminant as BCID was negative. Urine cultures did show some budding yeast. On admission patient was placed empirically on IV Rocephin and subsequently fluconazole was added patient received a total of 4 days of antibiotic treatment. Patient with a worsening leukocytosis and as such ID was consulted. Leukocytosis slowly started to trend back down.  Antibiotics and antifungals were discontinued per ID recommendations. Blood cultures were repeated with no growth to date by day of discharge. Patient remained afebrile.It was recommended per ID that patient did not need any further antibiotics at this time. Outpatient follow-up.   #2 confusion/difficulty ambulating Likely secondary to problem #1 and possible fecal impaction. CT head negative for any acute abnormalities. Per OT note patient hadn't walked in a long time with progressive weakness and reportedly stays in the recliner and stannds about 2 times a day to change her diapers. PT/OT recommended skilled nursing facility. Confusion improved and patient was close to baseline by day of discharge.  #  3 severe protein calorie malnutrition Patient has been seen by nutrition. Patient placed on nutritional supplementation.  #4 thrombocytosis Patient with chronically  elevated platelet count. Hemoglobin currently at 7.8. Patient with a leukocytosis. Concern for possible myeloproliferative disease. Outpatient follow-up.  #5 microcytic anemia Stable. Follow H&H.  #6 hypertension Patient's antihypertensive medications were held during the hospitalization. Patient's systolic blood pressures remained in the 110s to the 120s. Patient's anti-hypertensive medication have been discontinued on discharge.   #7 fecal impaction Noted on CT abdomen and pelvis. Patient was given a smog enema and had a bowel regimen adjusted to MiraLAX twice daily and senokot added. Patient had good results and will be discharged on current bowel regimen.   #8 chronic kidney disease stage III Stable.    Procedures:  CT abdomen and pelvis 07/13/2017  CT head 07/08/2017  Chest x-ray 07/08/2017  Consultations:  ID Dr.Comer 07/11/2017  Discharge Exam: Vitals:   07/15/17 0442 07/15/17 1343  BP: 114/80 123/70  Pulse: 82 96  Resp: 18 18  Temp: 98.7 F (37.1 C) 98.6 F (37 C)  SpO2: 95% 96%    General: NAD Cardiovascular: RRR Respiratory: CTAB  Discharge Instructions   Discharge Instructions    Diet - low sodium heart healthy    Complete by:  As directed    Increase activity slowly    Complete by:  As directed      Current Discharge Medication List    START taking these medications   Details  enoxaparin (LOVENOX) 30 MG/0.3ML injection Inject 0.3 mLs (30 mg total) into the skin daily. Qty: 0 Syringe    Multiple Vitamin (MULTIVITAMIN WITH MINERALS) TABS tablet Take 1 tablet by mouth daily.    senna-docusate (SENOKOT-S) 8.6-50 MG tablet Take 1 tablet by mouth at bedtime.      CONTINUE these medications which have CHANGED   Details  polyethylene glycol (MIRALAX / GLYCOLAX) packet Take 17 g by mouth 2 (two) times daily. Qty: 14 each, Refills: 0      CONTINUE these medications which have NOT CHANGED   Details  atorvastatin (LIPITOR) 10 MG tablet  Take 1 tablet (10 mg total) by mouth daily. Qty: 30 tablet, Refills: 6    feeding supplement (BOOST / RESOURCE BREEZE) LIQD Take 1 Container by mouth 2 (two) times daily between meals. Qty: 60 Container, Refills: 0      STOP taking these medications     amLODipine (NORVASC) 10 MG tablet      docusate sodium (COLACE) 100 MG capsule        No Known Allergies  Contact information for follow-up providers    MD AT SNF Follow up.            Contact information for after-discharge care    [facility] .   Specialty:  [place] information: [address] [place] [phone]                   The results of significant diagnostics from this hospitalization (including imaging, microbiology, ancillary and laboratory) are listed below for reference.    Significant Diagnostic Studies: Dg Chest 2 View  Result Date: 07/08/2017 CLINICAL DATA:  Patient with history of urinary tract infection. EXAM: CHEST  2 VIEW COMPARISON:  Chest radiograph 02/19/2017. FINDINGS: Patient is rotated to the left. Stable cardiac and mediastinal contours. Elevation right hemidiaphragm. No large area of pulmonary consolidation. Left basilar atelectasis. No pleural effusion or pneumothorax.  Thoracic spine degenerative changes. IMPRESSION: No acute cardiopulmonary process. Electronically Signed   By: Lovey Newcomer M.D.   On: 07/08/2017 16:40   Ct Head Wo Contrast  Result Date: 07/08/2017 CLINICAL DATA:  Worsening confusion over the past 2 days with inability to ambulate over the past couple days. EXAM: CT HEAD WITHOUT CONTRAST TECHNIQUE: Contiguous axial images were obtained from the base of the skull through the vertex without intravenous contrast. COMPARISON:  02/19/2017 FINDINGS: Brain: Ventricles, cisterns and other CSF spaces are within normal as there is minimal age related atrophic change. There is mild chronic ischemic  microvascular disease. There is no mass, mass effect, shift of midline structures or acute hemorrhage. No evidence of acute infarction. Small central right sided lacune infarct unchanged per Vascular: No hyperdense vessel or unexpected calcification. Skull: Normal. Negative for fracture or focal lesion. Sinuses/Orbits: No acute finding. Other: None. IMPRESSION: No acute intracranial findings. Mild chronic ischemic microvascular disease and mild age related atrophic change. 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 Electronically Signed   By: Marin Olp M.D.   On: 07/08/2017 17:38   Ct Abdomen Pelvis W Contrast  Result Date: 07/13/2017 CLINICAL DATA:  Abdominal pain EXAM: CT ABDOMEN AND PELVIS WITH CONTRAST TECHNIQUE: Multidetector CT imaging of the abdomen and pelvis was performed using the standard protocol following bolus administration of intravenous contrast. CONTRAST:  176mL ISOVUE-300 IOPAMIDOL (ISOVUE-300) INJECTION 61%, 74mL ISOVUE-300 IOPAMIDOL (ISOVUE-300) INJECTION 61% COMPARISON:  02/27/2017 FINDINGS: Lower chest: Minimal bibasilar atelectatic changes are noted. Hepatobiliary: Scattered cysts are noted throughout the liver stable from the prior exam. Gallbladder is well distended with a few dependent gallstones. No biliary ductal dilatation is seen per Pancreas: Unremarkable. No pancreatic ductal dilatation or surrounding inflammatory changes. Spleen: Normal in size without focal abnormality. Adrenals/Urinary Tract: The adrenal glands are within normal limits bilaterally. Scattered renal cysts are again identified bilaterally similar to that seen on the prior exam. No obstructive changes are noted. No definitive calculi are seen. The bladder is incompletely distended. Some bladder wall thickening is noted which may be related to poor distention. Some trabeculation of the bladder wall is noted. Stomach/Bowel: Rectum remains distended with fecal material similar to that seen on the prior exam.  Possibility of mild fecal impaction deserves consideration. No obstructive changes are seen. The appendix is not well visualized although no inflammatory changes to suggest appendicitis are noted. Vascular/Lymphatic: Aortic atherosclerosis. No enlarged abdominal or pelvic lymph nodes. Reproductive: Uterus is well visualized with some air within them endometrial cavity. This is of uncertain significance. This is new from the prior study. Other: No abdominal wall hernia or abnormality. No abdominopelvic ascites. Musculoskeletal: Degenerative changes of lumbar spine are noted. Scoliosis is noted stable in appearance from the prior study. IMPRESSION: Cholelithiasis without complicating factors. Changes suggestive of rectal impaction similar to that seen on the prior exam. No proximal obstructive changes are seen. Bilateral renal cysts stable from the prior study. Bladder wall thickening is noted with increased trabeculation. Air within the endometrial cavity of uncertain significance. This has increased in the interval from the prior exam. Electronically Signed   By: Inez Catalina M.D.   On: 07/13/2017 16:09    Microbiology: Recent Results (from the past 240 hour(s))  Culture, blood (Routine x 2)     Status: None (Preliminary result)   Collection Time: 07/08/17  3:48 PM  Result Value Ref Range Status   Specimen Description BLOOD RIGHT FOREARM  Final   Special Requests   Final  BOTTLES DRAWN AEROBIC AND ANAEROBIC Blood Culture adequate volume   Culture  Setup Time   Final    GRAM POSITIVE COCCI IN CHAINS AEROBIC BOTTLE ONLY CRITICAL RESULT CALLED TO, READ BACK BY AND VERIFIED WITH: PHARMD M RENZ [id number] MLM    Culture   Final    GRAM POSITIVE COCCI IN CHAINS SENT TO [facility] FOR ID AND SUSCEPTIBILITY RESULT CALLED TO, READ BACK BY AND VERIFIED WITH: C VAN DAM,MD AT 1000 07/14/17 BY L BENFIELD CONCERNING DELAY IN CULTURE RESULTS Performed at [facility], [place] [address] [place], [place]  [address]    Report Status PENDING  Incomplete  Blood Culture ID Panel (Reflexed)     Status: None   Collection Time: 07/08/17  3:48 PM  Result Value Ref Range Status   Enterococcus species NOT DETECTED NOT DETECTED Final   Listeria monocytogenes NOT DETECTED NOT DETECTED Final   Staphylococcus species NOT DETECTED NOT DETECTED Final   Staphylococcus aureus NOT DETECTED NOT DETECTED Final   Streptococcus species NOT DETECTED NOT DETECTED Final   Streptococcus agalactiae NOT DETECTED NOT DETECTED Final   Streptococcus pneumoniae NOT DETECTED NOT DETECTED Final   Streptococcus pyogenes NOT DETECTED NOT DETECTED Final   Acinetobacter baumannii NOT DETECTED NOT DETECTED Final   Enterobacteriaceae species NOT DETECTED NOT DETECTED Final   Enterobacter cloacae complex NOT DETECTED NOT DETECTED Final   Escherichia coli NOT DETECTED NOT DETECTED Final   Klebsiella oxytoca NOT DETECTED NOT DETECTED Final   Klebsiella pneumoniae NOT DETECTED NOT DETECTED Final   Proteus species NOT DETECTED NOT DETECTED Final   Serratia marcescens NOT DETECTED NOT DETECTED Final   Haemophilus influenzae NOT DETECTED NOT DETECTED Final   Neisseria meningitidis NOT DETECTED NOT DETECTED Final   Pseudomonas aeruginosa NOT DETECTED NOT DETECTED Final   Candida albicans NOT DETECTED NOT DETECTED Final   Candida glabrata NOT DETECTED NOT DETECTED Final   Candida krusei NOT DETECTED NOT DETECTED Final   Candida parapsilosis NOT DETECTED NOT DETECTED Final   Candida tropicalis NOT DETECTED NOT DETECTED Final    Comment: Performed at [facility], [place] [address], [place], [place] [address]  Culture, blood (Routine x 2)     Status: None   Collection Time: 07/08/17  3:49 PM  Result Value Ref Range Status   Specimen Description BLOOD RIGHT ANTECUBITAL  Final   Special Requests   Final    BOTTLES DRAWN AEROBIC AND ANAEROBIC Blood Culture adequate volume   Culture   Final    NO GROWTH 5 DAYS Performed at [facility], [place] [address] [place], [place] [address]    Report Status 07/13/2017 FINAL  Final  Urine Culture     Status: Abnormal   Collection Time: 07/08/17  6:40 PM  Result Value Ref Range Status   Specimen Description URINE, RANDOM  Final   Special Requests NONE  Final   Culture MULTIPLE SPECIES PRESENT, SUGGEST RECOLLECTION (A)  Final   Report Status 07/10/2017 FINAL  Final  Culture, blood (Routine X 2) w Reflex to ID Panel     Status: None (Preliminary result)   Collection Time: 07/14/17  9:12 AM  Result Value Ref Range Status   Specimen Description BLOOD LEFT ANTECUBITAL  Final   Special Requests   Final    BOTTLES DRAWN AEROBIC AND ANAEROBIC Blood Culture adequate volume   Culture   Final    NO GROWTH 1 DAY Performed at [facility], [address] [place], [place] [address]    Report Status PENDING  Incomplete  Culture, blood (Routine X 2) w Reflex to ID Panel     Status: None (Preliminary result)   Collection Time: 07/14/17  9:21 AM  Result Value Ref Range Status   Specimen Description BLOOD LEFT ANTECUBITAL  Final   Special Requests   Final    BOTTLES DRAWN AEROBIC AND ANAEROBIC Blood Culture adequate volume   Culture   Final    NO GROWTH 1 DAY Performed at [facility], [place] [address] [place], [place] [address]    Report Status PENDING  Incomplete     Labs: Basic Metabolic Panel:  Recent Labs Lab 07/09/17 0509 07/10/17 0507 07/11/17 0434 07/12/17 0430 07/15/17 0425  NA 139 141 139 139 139  K 3.7 3.8 3.6 3.9 3.5  CL 108 109 103 105 105  CO2 20* 23 24 24 25   GLUCOSE 90 92 88 89 86  BUN 11 12 11 14 12   CREATININE 0.96 0.92 0.98 1.01* 1.03*  CALCIUM 8.5* 9.0 8.7* 8.6* 8.3*   Liver Function Tests:  Recent Labs Lab 07/08/17 1553  AST 37  ALT 12*  ALKPHOS 97  BILITOT 0.8  PROT 8.7*  ALBUMIN 3.8   No results for input(s): LIPASE, AMYLASE in the last 168 hours. No results for input(s): AMMONIA in the last 168 hours. CBC:  Recent  Labs Lab 07/08/17 1553  07/11/17 0434 07/12/17 0430 07/13/17 0425 07/14/17 0418 07/15/17 0425  WBC 27.7*  < > 24.9* 24.5* 24.8* 21.9* 21.3*  NEUTROABS 21.1*  --   --   --   --  16.6* 15.8*  HGB 8.9*  < > 7.8* 7.9* 8.3* 7.8* 7.7*  HCT 28.3*  < > 25.5* 25.5* 26.3* 25.2* 25.2*  MCV 73.7*  < > 73.1* 73.9* 73.3* 74.1* 74.8*  PLT 695*  < > 553* 545* 524* 533* PLATELET CLUMPS NOTED ON SMEAR, COUNT APPEARS INCREASED  < > = values in this interval not displayed. Cardiac Enzymes: No results for input(s): CKTOTAL, CKMB, CKMBINDEX, TROPONINI in the last 168 hours. BNP: BNP (last 3 results) No results for input(s): BNP in the last 8760 hours.  ProBNP (last 3 results) No results for input(s): PROBNP in the last 8760 hours.  CBG:  Recent Labs Lab 07/08/17 1633  GLUCAP 90       Signed:  Ashante Snelling MD.  [facility] 07/15/2017, 2:55 PM

## 2017-07-15 NOTE — Progress Notes (Signed)
Pt to discharge to SNF, CSW following.

## 2017-07-19 LAB — CULTURE, BLOOD (ROUTINE X 2)
CULTURE: NO GROWTH
CULTURE: NO GROWTH
Special Requests: ADEQUATE
Special Requests: ADEQUATE

## 2017-07-22 DIAGNOSIS — I10 Essential (primary) hypertension: Secondary | ICD-10-CM | POA: Diagnosis not present

## 2017-07-22 DIAGNOSIS — I2510 Atherosclerotic heart disease of native coronary artery without angina pectoris: Secondary | ICD-10-CM | POA: Diagnosis not present

## 2017-07-22 DIAGNOSIS — F0391 Unspecified dementia with behavioral disturbance: Secondary | ICD-10-CM | POA: Diagnosis not present

## 2017-07-22 DIAGNOSIS — E785 Hyperlipidemia, unspecified: Secondary | ICD-10-CM | POA: Diagnosis not present

## 2017-08-03 ENCOUNTER — Other Ambulatory Visit: Payer: Self-pay | Admitting: *Deleted

## 2017-08-03 ENCOUNTER — Telehealth: Payer: Self-pay | Admitting: Family Medicine

## 2017-08-03 DIAGNOSIS — N183 Chronic kidney disease, stage 3 unspecified: Secondary | ICD-10-CM

## 2017-08-03 NOTE — Patient Outreach (Signed)
[facility]) Care Management  08/03/2017  Ashlee Mack [birth year] [id number]   Spoke with son, Winnona Wargo at [phone], per referral from [place] at facility. He reports patient is being discharged tomorrow on Thanksgiving, he is unsure why they are discharging on a holiday.   He reports that he knows they will have home health services 3 days a week, but he feels like they need more services.  He has been to social services to inquire on Medicaid, he does not think she qualifies.  He states they do need more help in the home if possible.   RNCM reviewed [facility] care management, distinguished between home care and care management.  Son would like to have a SW referral to review community resources with him and his family  He give verbal consent for referral.  He states to call him at [phone] after 2 pm due to his job.  RNCM discussed upcoming closures due to holiday, he verbalizes understanding.   Plan to refer to [facility] LCSW to review community resources and possible home care providers program.  [name]. [name], RN, BSN, [place] [phone]) Business Cell  [phone]) Toll Free Office

## 2017-08-03 NOTE — Telephone Encounter (Signed)
Noted  

## 2017-08-03 NOTE — Patient Outreach (Signed)
[facility]) Care Management  08/03/2017  Ashlee Mack 28-Mar-1941 [id number]   Spoke with [name], SW at facility, she reports patient is going home with spouse and son 08/04/17/ She will have home care services.   She states that son, [name] is RP for patient and recommends calling him re: [facility] care management.   Plan to attempt to reach out to son to discuss [facility] care management.  [name]. [name], RN, BSN, [place] [phone]) Business Cell  [phone]) Toll Free Office

## 2017-08-03 NOTE — Telephone Encounter (Signed)
[facility] called to let the provider know that they received the referral for home care and they will be going to her home on 08/07/17 to assess the pt

## 2017-08-03 NOTE — Patient Outreach (Signed)
Out reach attempt to son Ashlee Mack [phone]. He reports he is at work and cannot talk. Requests call back later.  Plan to attempt to reach again later.  [name]. [name], RN, BSN, [place] [phone]) Business Cell  [phone]) Toll Free Office

## 2017-08-07 DIAGNOSIS — M6281 Muscle weakness (generalized): Secondary | ICD-10-CM | POA: Diagnosis not present

## 2017-08-07 DIAGNOSIS — F0390 Unspecified dementia without behavioral disturbance: Secondary | ICD-10-CM | POA: Diagnosis not present

## 2017-08-07 DIAGNOSIS — N183 Chronic kidney disease, stage 3 (moderate): Secondary | ICD-10-CM | POA: Diagnosis not present

## 2017-08-07 DIAGNOSIS — I129 Hypertensive chronic kidney disease with stage 1 through stage 4 chronic kidney disease, or unspecified chronic kidney disease: Secondary | ICD-10-CM | POA: Diagnosis not present

## 2017-08-07 DIAGNOSIS — E43 Unspecified severe protein-calorie malnutrition: Secondary | ICD-10-CM | POA: Diagnosis not present

## 2017-08-07 DIAGNOSIS — I2510 Atherosclerotic heart disease of native coronary artery without angina pectoris: Secondary | ICD-10-CM | POA: Diagnosis not present

## 2017-08-08 ENCOUNTER — Encounter: Payer: Self-pay | Admitting: *Deleted

## 2017-08-08 ENCOUNTER — Other Ambulatory Visit: Payer: Self-pay | Admitting: *Deleted

## 2017-08-08 DIAGNOSIS — F0390 Unspecified dementia without behavioral disturbance: Secondary | ICD-10-CM | POA: Diagnosis not present

## 2017-08-08 DIAGNOSIS — E43 Unspecified severe protein-calorie malnutrition: Secondary | ICD-10-CM | POA: Diagnosis not present

## 2017-08-08 DIAGNOSIS — I129 Hypertensive chronic kidney disease with stage 1 through stage 4 chronic kidney disease, or unspecified chronic kidney disease: Secondary | ICD-10-CM | POA: Diagnosis not present

## 2017-08-08 DIAGNOSIS — I2510 Atherosclerotic heart disease of native coronary artery without angina pectoris: Secondary | ICD-10-CM | POA: Diagnosis not present

## 2017-08-08 DIAGNOSIS — N183 Chronic kidney disease, stage 3 (moderate): Secondary | ICD-10-CM | POA: Diagnosis not present

## 2017-08-08 DIAGNOSIS — M6281 Muscle weakness (generalized): Secondary | ICD-10-CM | POA: Diagnosis not present

## 2017-08-08 NOTE — Patient Outreach (Addendum)
[facility]) Care Management  08/08/2017  Maila Dukes 1941-08-08 [id number]   CSW was able to make initial contact with patient's son, Deserae Jennings today to perform the phone assessment on patient, as well as assess and assist with social work needs and services.  CSW introduced self, explained role and types of services provided through [facility] ([facility]).  CSW further explained to Mr. Melroy that CSW works with Burgess Amor, [place] Coordinator, also with [facility]. CSW then explained the reason for the call, indicating that Mrs. Niemczura thought that patient would benefit from social work services and resources to assist with arranging in-home care services for patient.  CSW obtained two HIPAA compliant identifiers from patient, which included patient's name and date of birth. Mr. Cummings reported that [facility] were arranged for patient upon discharge from [facility] [place] where patient was residing to receive short-term rehabilitative services.  Patient was discharged from the skilled nursing facility on Thursday, August 04, 2017, returning home to live with her husband and son.  Mr. Rehberg believes that patient would benefit from additional in-home care services, requesting that CSW assist with this process.  CSW inquired as to whether or not Mr. Heaps thought that patient would qualify for Adult Medicaid, which Mr. Moening denied.  CSW then inquired as to whether or not patient would be able to pay for in-home care services out of pocket, for which Mr. Pebley again denied. Mr. Schrier was under the impression that patient's insurance would cover the expense of in-home care services.  CSW explained the difference between in-home care services and home health services, as insurance will cover home health services at 80%, leaving the patient responsible for paying the additional  20%.  Patient does not have a supplemental policy or long-term care insurance policy; therefore, in-home care services would be an out-of-pocket expense.  Mr. Loyd was no longer interested in receiving a referral for services, explaining that they are already on a very fixed income.  CSW directed Mr. Hidalgo to the following website ([email or web address]), in the event that he changes his mind.  CSW explained that the website offers a list of approved providers with regards to home health agencies and private agency sitters. CSW will perform a case closure on patient, as all goals of treatment have been met from social work standpoint and no additional social work needs have been identified at this time.  CSW will fax an update to patient's Primary Care Physician, Dr. Arnoldo Morale to ensure that they are aware of CSW's involvement with patient's plan of care.  CSW will submit a case closure request to Alycia Rossetti, Care Management Assistant with [facility], in the form of an In [employer].   Nat Christen, BSW, MSW, LCSW  Licensed Education officer, [facility]  Mailing [place] [address], [place], [place] [address] Physical Address-[address], [place], [place] [address] Toll Free Main # [phone] Fax # [phone] Cell # [phone]  Office # [phone] [email or web address]

## 2017-08-09 DIAGNOSIS — N183 Chronic kidney disease, stage 3 (moderate): Secondary | ICD-10-CM | POA: Diagnosis not present

## 2017-08-09 DIAGNOSIS — E43 Unspecified severe protein-calorie malnutrition: Secondary | ICD-10-CM | POA: Diagnosis not present

## 2017-08-09 DIAGNOSIS — F0390 Unspecified dementia without behavioral disturbance: Secondary | ICD-10-CM | POA: Diagnosis not present

## 2017-08-09 DIAGNOSIS — I2510 Atherosclerotic heart disease of native coronary artery without angina pectoris: Secondary | ICD-10-CM | POA: Diagnosis not present

## 2017-08-09 DIAGNOSIS — M6281 Muscle weakness (generalized): Secondary | ICD-10-CM | POA: Diagnosis not present

## 2017-08-09 DIAGNOSIS — I129 Hypertensive chronic kidney disease with stage 1 through stage 4 chronic kidney disease, or unspecified chronic kidney disease: Secondary | ICD-10-CM | POA: Diagnosis not present

## 2017-08-10 DIAGNOSIS — N183 Chronic kidney disease, stage 3 (moderate): Secondary | ICD-10-CM | POA: Diagnosis not present

## 2017-08-10 DIAGNOSIS — M6281 Muscle weakness (generalized): Secondary | ICD-10-CM | POA: Diagnosis not present

## 2017-08-10 DIAGNOSIS — E43 Unspecified severe protein-calorie malnutrition: Secondary | ICD-10-CM | POA: Diagnosis not present

## 2017-08-10 DIAGNOSIS — I129 Hypertensive chronic kidney disease with stage 1 through stage 4 chronic kidney disease, or unspecified chronic kidney disease: Secondary | ICD-10-CM | POA: Diagnosis not present

## 2017-08-10 DIAGNOSIS — F0390 Unspecified dementia without behavioral disturbance: Secondary | ICD-10-CM | POA: Diagnosis not present

## 2017-08-10 DIAGNOSIS — I2510 Atherosclerotic heart disease of native coronary artery without angina pectoris: Secondary | ICD-10-CM | POA: Diagnosis not present

## 2017-08-11 DIAGNOSIS — M6281 Muscle weakness (generalized): Secondary | ICD-10-CM | POA: Diagnosis not present

## 2017-08-11 DIAGNOSIS — Z0000 Encounter for general adult medical examination without abnormal findings: Secondary | ICD-10-CM | POA: Diagnosis not present

## 2017-08-11 DIAGNOSIS — I2510 Atherosclerotic heart disease of native coronary artery without angina pectoris: Secondary | ICD-10-CM | POA: Diagnosis not present

## 2017-08-11 DIAGNOSIS — E43 Unspecified severe protein-calorie malnutrition: Secondary | ICD-10-CM | POA: Diagnosis not present

## 2017-08-11 DIAGNOSIS — F0390 Unspecified dementia without behavioral disturbance: Secondary | ICD-10-CM | POA: Diagnosis not present

## 2017-08-11 DIAGNOSIS — I129 Hypertensive chronic kidney disease with stage 1 through stage 4 chronic kidney disease, or unspecified chronic kidney disease: Secondary | ICD-10-CM | POA: Diagnosis not present

## 2017-08-11 DIAGNOSIS — I10 Essential (primary) hypertension: Secondary | ICD-10-CM | POA: Diagnosis not present

## 2017-08-11 DIAGNOSIS — K5909 Other constipation: Secondary | ICD-10-CM | POA: Diagnosis not present

## 2017-08-11 DIAGNOSIS — N183 Chronic kidney disease, stage 3 (moderate): Secondary | ICD-10-CM | POA: Diagnosis not present

## 2017-08-11 DIAGNOSIS — E782 Mixed hyperlipidemia: Secondary | ICD-10-CM | POA: Diagnosis not present

## 2017-08-11 DIAGNOSIS — Z136 Encounter for screening for cardiovascular disorders: Secondary | ICD-10-CM | POA: Diagnosis not present

## 2017-08-11 LAB — MISC LABCORP TEST (SEND OUT): Labcorp test code: 182261

## 2017-08-12 DIAGNOSIS — M6281 Muscle weakness (generalized): Secondary | ICD-10-CM | POA: Diagnosis not present

## 2017-08-12 DIAGNOSIS — I129 Hypertensive chronic kidney disease with stage 1 through stage 4 chronic kidney disease, or unspecified chronic kidney disease: Secondary | ICD-10-CM | POA: Diagnosis not present

## 2017-08-12 DIAGNOSIS — F0390 Unspecified dementia without behavioral disturbance: Secondary | ICD-10-CM | POA: Diagnosis not present

## 2017-08-12 DIAGNOSIS — E43 Unspecified severe protein-calorie malnutrition: Secondary | ICD-10-CM | POA: Diagnosis not present

## 2017-08-12 DIAGNOSIS — N183 Chronic kidney disease, stage 3 (moderate): Secondary | ICD-10-CM | POA: Diagnosis not present

## 2017-08-12 DIAGNOSIS — I2510 Atherosclerotic heart disease of native coronary artery without angina pectoris: Secondary | ICD-10-CM | POA: Diagnosis not present

## 2017-08-15 DIAGNOSIS — F0390 Unspecified dementia without behavioral disturbance: Secondary | ICD-10-CM | POA: Diagnosis not present

## 2017-08-15 DIAGNOSIS — I2510 Atherosclerotic heart disease of native coronary artery without angina pectoris: Secondary | ICD-10-CM | POA: Diagnosis not present

## 2017-08-15 DIAGNOSIS — M6281 Muscle weakness (generalized): Secondary | ICD-10-CM | POA: Diagnosis not present

## 2017-08-15 DIAGNOSIS — N183 Chronic kidney disease, stage 3 (moderate): Secondary | ICD-10-CM | POA: Diagnosis not present

## 2017-08-15 DIAGNOSIS — E43 Unspecified severe protein-calorie malnutrition: Secondary | ICD-10-CM | POA: Diagnosis not present

## 2017-08-15 DIAGNOSIS — I129 Hypertensive chronic kidney disease with stage 1 through stage 4 chronic kidney disease, or unspecified chronic kidney disease: Secondary | ICD-10-CM | POA: Diagnosis not present

## 2017-08-16 DIAGNOSIS — I2510 Atherosclerotic heart disease of native coronary artery without angina pectoris: Secondary | ICD-10-CM | POA: Diagnosis not present

## 2017-08-16 DIAGNOSIS — F0390 Unspecified dementia without behavioral disturbance: Secondary | ICD-10-CM | POA: Diagnosis not present

## 2017-08-16 DIAGNOSIS — N183 Chronic kidney disease, stage 3 (moderate): Secondary | ICD-10-CM | POA: Diagnosis not present

## 2017-08-16 DIAGNOSIS — M6281 Muscle weakness (generalized): Secondary | ICD-10-CM | POA: Diagnosis not present

## 2017-08-16 DIAGNOSIS — E43 Unspecified severe protein-calorie malnutrition: Secondary | ICD-10-CM | POA: Diagnosis not present

## 2017-08-16 DIAGNOSIS — I129 Hypertensive chronic kidney disease with stage 1 through stage 4 chronic kidney disease, or unspecified chronic kidney disease: Secondary | ICD-10-CM | POA: Diagnosis not present

## 2017-08-16 LAB — CULTURE, BLOOD (ROUTINE X 2): Special Requests: ADEQUATE

## 2017-08-17 DIAGNOSIS — E43 Unspecified severe protein-calorie malnutrition: Secondary | ICD-10-CM | POA: Diagnosis not present

## 2017-08-17 DIAGNOSIS — I129 Hypertensive chronic kidney disease with stage 1 through stage 4 chronic kidney disease, or unspecified chronic kidney disease: Secondary | ICD-10-CM | POA: Diagnosis not present

## 2017-08-17 DIAGNOSIS — N183 Chronic kidney disease, stage 3 (moderate): Secondary | ICD-10-CM | POA: Diagnosis not present

## 2017-08-17 DIAGNOSIS — M6281 Muscle weakness (generalized): Secondary | ICD-10-CM | POA: Diagnosis not present

## 2017-08-17 DIAGNOSIS — F0390 Unspecified dementia without behavioral disturbance: Secondary | ICD-10-CM | POA: Diagnosis not present

## 2017-08-17 DIAGNOSIS — I2510 Atherosclerotic heart disease of native coronary artery without angina pectoris: Secondary | ICD-10-CM | POA: Diagnosis not present

## 2017-08-18 DIAGNOSIS — I129 Hypertensive chronic kidney disease with stage 1 through stage 4 chronic kidney disease, or unspecified chronic kidney disease: Secondary | ICD-10-CM | POA: Diagnosis not present

## 2017-08-18 DIAGNOSIS — M6281 Muscle weakness (generalized): Secondary | ICD-10-CM | POA: Diagnosis not present

## 2017-08-18 DIAGNOSIS — I2510 Atherosclerotic heart disease of native coronary artery without angina pectoris: Secondary | ICD-10-CM | POA: Diagnosis not present

## 2017-08-18 DIAGNOSIS — E43 Unspecified severe protein-calorie malnutrition: Secondary | ICD-10-CM | POA: Diagnosis not present

## 2017-08-18 DIAGNOSIS — N183 Chronic kidney disease, stage 3 (moderate): Secondary | ICD-10-CM | POA: Diagnosis not present

## 2017-08-18 DIAGNOSIS — F0390 Unspecified dementia without behavioral disturbance: Secondary | ICD-10-CM | POA: Diagnosis not present

## 2017-08-19 ENCOUNTER — Telehealth: Payer: Self-pay | Admitting: Family Medicine

## 2017-08-19 NOTE — Telephone Encounter (Signed)
Is it ok to give the verbal order?

## 2017-08-19 NOTE — Telephone Encounter (Signed)
[facility] call (nancy) to request a order for 1 time a week for 4 weeks for hospital bed set up for Pt, please call her [phone], please follow up

## 2017-08-20 DIAGNOSIS — I2510 Atherosclerotic heart disease of native coronary artery without angina pectoris: Secondary | ICD-10-CM | POA: Diagnosis not present

## 2017-08-20 DIAGNOSIS — F0390 Unspecified dementia without behavioral disturbance: Secondary | ICD-10-CM | POA: Diagnosis not present

## 2017-08-20 DIAGNOSIS — E43 Unspecified severe protein-calorie malnutrition: Secondary | ICD-10-CM | POA: Diagnosis not present

## 2017-08-20 DIAGNOSIS — I129 Hypertensive chronic kidney disease with stage 1 through stage 4 chronic kidney disease, or unspecified chronic kidney disease: Secondary | ICD-10-CM | POA: Diagnosis not present

## 2017-08-20 DIAGNOSIS — M6281 Muscle weakness (generalized): Secondary | ICD-10-CM | POA: Diagnosis not present

## 2017-08-20 DIAGNOSIS — N183 Chronic kidney disease, stage 3 (moderate): Secondary | ICD-10-CM | POA: Diagnosis not present

## 2017-08-21 LAB — SUSCEPTIBILITY, AER + ANAEROB

## 2017-08-23 DIAGNOSIS — N183 Chronic kidney disease, stage 3 (moderate): Secondary | ICD-10-CM | POA: Diagnosis not present

## 2017-08-23 DIAGNOSIS — I2510 Atherosclerotic heart disease of native coronary artery without angina pectoris: Secondary | ICD-10-CM | POA: Diagnosis not present

## 2017-08-23 DIAGNOSIS — E43 Unspecified severe protein-calorie malnutrition: Secondary | ICD-10-CM | POA: Diagnosis not present

## 2017-08-23 DIAGNOSIS — I129 Hypertensive chronic kidney disease with stage 1 through stage 4 chronic kidney disease, or unspecified chronic kidney disease: Secondary | ICD-10-CM | POA: Diagnosis not present

## 2017-08-23 DIAGNOSIS — M6281 Muscle weakness (generalized): Secondary | ICD-10-CM | POA: Diagnosis not present

## 2017-08-23 DIAGNOSIS — F0390 Unspecified dementia without behavioral disturbance: Secondary | ICD-10-CM | POA: Diagnosis not present

## 2017-08-24 NOTE — Telephone Encounter (Signed)
Ashlee Mack was called and a VM was left giving her verbal orders for MS.[place].

## 2017-08-24 NOTE — Telephone Encounter (Signed)
okay

## 2017-08-25 DIAGNOSIS — E43 Unspecified severe protein-calorie malnutrition: Secondary | ICD-10-CM | POA: Diagnosis not present

## 2017-08-25 DIAGNOSIS — M6281 Muscle weakness (generalized): Secondary | ICD-10-CM | POA: Diagnosis not present

## 2017-08-25 DIAGNOSIS — F0390 Unspecified dementia without behavioral disturbance: Secondary | ICD-10-CM | POA: Diagnosis not present

## 2017-08-25 DIAGNOSIS — I129 Hypertensive chronic kidney disease with stage 1 through stage 4 chronic kidney disease, or unspecified chronic kidney disease: Secondary | ICD-10-CM | POA: Diagnosis not present

## 2017-08-25 DIAGNOSIS — N183 Chronic kidney disease, stage 3 (moderate): Secondary | ICD-10-CM | POA: Diagnosis not present

## 2017-08-25 DIAGNOSIS — I2510 Atherosclerotic heart disease of native coronary artery without angina pectoris: Secondary | ICD-10-CM | POA: Diagnosis not present

## 2017-08-26 DIAGNOSIS — M2041 Other hammer toe(s) (acquired), right foot: Secondary | ICD-10-CM | POA: Diagnosis not present

## 2017-08-26 DIAGNOSIS — M6281 Muscle weakness (generalized): Secondary | ICD-10-CM | POA: Diagnosis not present

## 2017-08-26 DIAGNOSIS — B351 Tinea unguium: Secondary | ICD-10-CM | POA: Diagnosis not present

## 2017-08-26 DIAGNOSIS — E43 Unspecified severe protein-calorie malnutrition: Secondary | ICD-10-CM | POA: Diagnosis not present

## 2017-08-26 DIAGNOSIS — I129 Hypertensive chronic kidney disease with stage 1 through stage 4 chronic kidney disease, or unspecified chronic kidney disease: Secondary | ICD-10-CM | POA: Diagnosis not present

## 2017-08-26 DIAGNOSIS — M2042 Other hammer toe(s) (acquired), left foot: Secondary | ICD-10-CM | POA: Diagnosis not present

## 2017-08-26 DIAGNOSIS — F0390 Unspecified dementia without behavioral disturbance: Secondary | ICD-10-CM | POA: Diagnosis not present

## 2017-08-26 DIAGNOSIS — N183 Chronic kidney disease, stage 3 (moderate): Secondary | ICD-10-CM | POA: Diagnosis not present

## 2017-08-26 DIAGNOSIS — L84 Corns and callosities: Secondary | ICD-10-CM | POA: Diagnosis not present

## 2017-08-26 DIAGNOSIS — M79675 Pain in left toe(s): Secondary | ICD-10-CM | POA: Diagnosis not present

## 2017-08-26 DIAGNOSIS — I2510 Atherosclerotic heart disease of native coronary artery without angina pectoris: Secondary | ICD-10-CM | POA: Diagnosis not present

## 2017-08-26 DIAGNOSIS — I739 Peripheral vascular disease, unspecified: Secondary | ICD-10-CM | POA: Diagnosis not present

## 2017-08-29 DIAGNOSIS — N183 Chronic kidney disease, stage 3 (moderate): Secondary | ICD-10-CM | POA: Diagnosis not present

## 2017-08-29 DIAGNOSIS — M6281 Muscle weakness (generalized): Secondary | ICD-10-CM | POA: Diagnosis not present

## 2017-08-29 DIAGNOSIS — E43 Unspecified severe protein-calorie malnutrition: Secondary | ICD-10-CM | POA: Diagnosis not present

## 2017-08-29 DIAGNOSIS — F0390 Unspecified dementia without behavioral disturbance: Secondary | ICD-10-CM | POA: Diagnosis not present

## 2017-08-29 DIAGNOSIS — I129 Hypertensive chronic kidney disease with stage 1 through stage 4 chronic kidney disease, or unspecified chronic kidney disease: Secondary | ICD-10-CM | POA: Diagnosis not present

## 2017-08-29 DIAGNOSIS — I2510 Atherosclerotic heart disease of native coronary artery without angina pectoris: Secondary | ICD-10-CM | POA: Diagnosis not present

## 2017-08-30 DIAGNOSIS — I2510 Atherosclerotic heart disease of native coronary artery without angina pectoris: Secondary | ICD-10-CM | POA: Diagnosis not present

## 2017-08-30 DIAGNOSIS — F0390 Unspecified dementia without behavioral disturbance: Secondary | ICD-10-CM | POA: Diagnosis not present

## 2017-08-30 DIAGNOSIS — E43 Unspecified severe protein-calorie malnutrition: Secondary | ICD-10-CM | POA: Diagnosis not present

## 2017-08-30 DIAGNOSIS — N183 Chronic kidney disease, stage 3 (moderate): Secondary | ICD-10-CM | POA: Diagnosis not present

## 2017-08-30 DIAGNOSIS — I129 Hypertensive chronic kidney disease with stage 1 through stage 4 chronic kidney disease, or unspecified chronic kidney disease: Secondary | ICD-10-CM | POA: Diagnosis not present

## 2017-08-30 DIAGNOSIS — M6281 Muscle weakness (generalized): Secondary | ICD-10-CM | POA: Diagnosis not present

## 2017-09-01 ENCOUNTER — Telehealth: Payer: Self-pay | Admitting: Family Medicine

## 2017-09-01 DIAGNOSIS — E43 Unspecified severe protein-calorie malnutrition: Secondary | ICD-10-CM | POA: Diagnosis not present

## 2017-09-01 DIAGNOSIS — N183 Chronic kidney disease, stage 3 (moderate): Secondary | ICD-10-CM | POA: Diagnosis not present

## 2017-09-01 DIAGNOSIS — I2510 Atherosclerotic heart disease of native coronary artery without angina pectoris: Secondary | ICD-10-CM | POA: Diagnosis not present

## 2017-09-01 DIAGNOSIS — I129 Hypertensive chronic kidney disease with stage 1 through stage 4 chronic kidney disease, or unspecified chronic kidney disease: Secondary | ICD-10-CM | POA: Diagnosis not present

## 2017-09-01 DIAGNOSIS — M6281 Muscle weakness (generalized): Secondary | ICD-10-CM | POA: Diagnosis not present

## 2017-09-01 DIAGNOSIS — F0390 Unspecified dementia without behavioral disturbance: Secondary | ICD-10-CM | POA: Diagnosis not present

## 2017-09-01 NOTE — Telephone Encounter (Signed)
Nurse Donaciano Eva called form [facility],to request skilled nursing orders for twice a week.and home health aid twice a week for Ashlee Mack Please follow up  [phone]

## 2017-09-02 LAB — SUSCEPTIBILITY RESULT

## 2017-09-02 NOTE — Telephone Encounter (Signed)
Levada Dy was called and given the verbal orders for home health.

## 2017-09-02 NOTE — Telephone Encounter (Signed)
Okay to proceed.  

## 2017-09-05 DIAGNOSIS — M6281 Muscle weakness (generalized): Secondary | ICD-10-CM | POA: Diagnosis not present

## 2017-09-05 DIAGNOSIS — F0390 Unspecified dementia without behavioral disturbance: Secondary | ICD-10-CM | POA: Diagnosis not present

## 2017-09-05 DIAGNOSIS — I129 Hypertensive chronic kidney disease with stage 1 through stage 4 chronic kidney disease, or unspecified chronic kidney disease: Secondary | ICD-10-CM | POA: Diagnosis not present

## 2017-09-05 DIAGNOSIS — E43 Unspecified severe protein-calorie malnutrition: Secondary | ICD-10-CM | POA: Diagnosis not present

## 2017-09-05 DIAGNOSIS — N183 Chronic kidney disease, stage 3 (moderate): Secondary | ICD-10-CM | POA: Diagnosis not present

## 2017-09-05 DIAGNOSIS — I2510 Atherosclerotic heart disease of native coronary artery without angina pectoris: Secondary | ICD-10-CM | POA: Diagnosis not present

## 2017-09-08 DIAGNOSIS — E43 Unspecified severe protein-calorie malnutrition: Secondary | ICD-10-CM | POA: Diagnosis not present

## 2017-09-08 DIAGNOSIS — I2510 Atherosclerotic heart disease of native coronary artery without angina pectoris: Secondary | ICD-10-CM | POA: Diagnosis not present

## 2017-09-08 DIAGNOSIS — F0390 Unspecified dementia without behavioral disturbance: Secondary | ICD-10-CM | POA: Diagnosis not present

## 2017-09-08 DIAGNOSIS — N183 Chronic kidney disease, stage 3 (moderate): Secondary | ICD-10-CM | POA: Diagnosis not present

## 2017-09-08 DIAGNOSIS — I129 Hypertensive chronic kidney disease with stage 1 through stage 4 chronic kidney disease, or unspecified chronic kidney disease: Secondary | ICD-10-CM | POA: Diagnosis not present

## 2017-09-08 DIAGNOSIS — M6281 Muscle weakness (generalized): Secondary | ICD-10-CM | POA: Diagnosis not present

## 2017-09-10 DIAGNOSIS — I6381 Other cerebral infarction due to occlusion or stenosis of small artery: Secondary | ICD-10-CM | POA: Diagnosis not present

## 2017-09-10 DIAGNOSIS — I10 Essential (primary) hypertension: Secondary | ICD-10-CM | POA: Diagnosis not present

## 2017-09-10 DIAGNOSIS — K5900 Constipation, unspecified: Secondary | ICD-10-CM | POA: Diagnosis not present

## 2017-09-10 DIAGNOSIS — M6281 Muscle weakness (generalized): Secondary | ICD-10-CM | POA: Diagnosis not present

## 2017-09-12 DIAGNOSIS — N183 Chronic kidney disease, stage 3 (moderate): Secondary | ICD-10-CM | POA: Diagnosis not present

## 2017-09-12 DIAGNOSIS — E43 Unspecified severe protein-calorie malnutrition: Secondary | ICD-10-CM | POA: Diagnosis not present

## 2017-09-12 DIAGNOSIS — I129 Hypertensive chronic kidney disease with stage 1 through stage 4 chronic kidney disease, or unspecified chronic kidney disease: Secondary | ICD-10-CM | POA: Diagnosis not present

## 2017-09-12 DIAGNOSIS — I2510 Atherosclerotic heart disease of native coronary artery without angina pectoris: Secondary | ICD-10-CM | POA: Diagnosis not present

## 2017-09-12 DIAGNOSIS — M6281 Muscle weakness (generalized): Secondary | ICD-10-CM | POA: Diagnosis not present

## 2017-09-12 DIAGNOSIS — F0390 Unspecified dementia without behavioral disturbance: Secondary | ICD-10-CM | POA: Diagnosis not present

## 2017-09-14 DIAGNOSIS — I129 Hypertensive chronic kidney disease with stage 1 through stage 4 chronic kidney disease, or unspecified chronic kidney disease: Secondary | ICD-10-CM | POA: Diagnosis not present

## 2017-09-14 DIAGNOSIS — I2510 Atherosclerotic heart disease of native coronary artery without angina pectoris: Secondary | ICD-10-CM | POA: Diagnosis not present

## 2017-09-14 DIAGNOSIS — F0390 Unspecified dementia without behavioral disturbance: Secondary | ICD-10-CM | POA: Diagnosis not present

## 2017-09-14 DIAGNOSIS — N183 Chronic kidney disease, stage 3 (moderate): Secondary | ICD-10-CM | POA: Diagnosis not present

## 2017-09-14 DIAGNOSIS — E43 Unspecified severe protein-calorie malnutrition: Secondary | ICD-10-CM | POA: Diagnosis not present

## 2017-09-14 DIAGNOSIS — M6281 Muscle weakness (generalized): Secondary | ICD-10-CM | POA: Diagnosis not present

## 2017-09-15 DIAGNOSIS — M6281 Muscle weakness (generalized): Secondary | ICD-10-CM | POA: Diagnosis not present

## 2017-09-15 DIAGNOSIS — I2510 Atherosclerotic heart disease of native coronary artery without angina pectoris: Secondary | ICD-10-CM | POA: Diagnosis not present

## 2017-09-15 DIAGNOSIS — F0390 Unspecified dementia without behavioral disturbance: Secondary | ICD-10-CM | POA: Diagnosis not present

## 2017-09-15 DIAGNOSIS — E43 Unspecified severe protein-calorie malnutrition: Secondary | ICD-10-CM | POA: Diagnosis not present

## 2017-09-15 DIAGNOSIS — N183 Chronic kidney disease, stage 3 (moderate): Secondary | ICD-10-CM | POA: Diagnosis not present

## 2017-09-15 DIAGNOSIS — I129 Hypertensive chronic kidney disease with stage 1 through stage 4 chronic kidney disease, or unspecified chronic kidney disease: Secondary | ICD-10-CM | POA: Diagnosis not present

## 2017-09-16 ENCOUNTER — Telehealth: Payer: Self-pay | Admitting: Family Medicine

## 2017-09-16 DIAGNOSIS — N183 Chronic kidney disease, stage 3 (moderate): Secondary | ICD-10-CM | POA: Diagnosis not present

## 2017-09-16 DIAGNOSIS — E43 Unspecified severe protein-calorie malnutrition: Secondary | ICD-10-CM | POA: Diagnosis not present

## 2017-09-16 DIAGNOSIS — M6281 Muscle weakness (generalized): Secondary | ICD-10-CM | POA: Diagnosis not present

## 2017-09-16 DIAGNOSIS — F0390 Unspecified dementia without behavioral disturbance: Secondary | ICD-10-CM | POA: Diagnosis not present

## 2017-09-16 DIAGNOSIS — I129 Hypertensive chronic kidney disease with stage 1 through stage 4 chronic kidney disease, or unspecified chronic kidney disease: Secondary | ICD-10-CM | POA: Diagnosis not present

## 2017-09-16 DIAGNOSIS — I2510 Atherosclerotic heart disease of native coronary artery without angina pectoris: Secondary | ICD-10-CM | POA: Diagnosis not present

## 2017-09-16 NOTE — Telephone Encounter (Signed)
Verbal orders were given 

## 2017-09-16 NOTE — Telephone Encounter (Signed)
[facility] called asking for orders for a Education officer, museum to come out and meet with the family. They want to talk about placing her in a home because she is getting weaker.

## 2017-09-19 DIAGNOSIS — E43 Unspecified severe protein-calorie malnutrition: Secondary | ICD-10-CM | POA: Diagnosis not present

## 2017-09-19 DIAGNOSIS — N183 Chronic kidney disease, stage 3 (moderate): Secondary | ICD-10-CM | POA: Diagnosis not present

## 2017-09-19 DIAGNOSIS — I2510 Atherosclerotic heart disease of native coronary artery without angina pectoris: Secondary | ICD-10-CM | POA: Diagnosis not present

## 2017-09-19 DIAGNOSIS — F0390 Unspecified dementia without behavioral disturbance: Secondary | ICD-10-CM | POA: Diagnosis not present

## 2017-09-19 DIAGNOSIS — M6281 Muscle weakness (generalized): Secondary | ICD-10-CM | POA: Diagnosis not present

## 2017-09-19 DIAGNOSIS — I129 Hypertensive chronic kidney disease with stage 1 through stage 4 chronic kidney disease, or unspecified chronic kidney disease: Secondary | ICD-10-CM | POA: Diagnosis not present

## 2017-09-20 NOTE — Telephone Encounter (Signed)
Social worker Product/process development scientist from [facility] called because family wants a referral to hospice. Please call back at [phone]

## 2017-09-21 NOTE — Telephone Encounter (Signed)
Could you please place a referral to hospice for this patient?  Thank you

## 2017-09-22 DIAGNOSIS — I129 Hypertensive chronic kidney disease with stage 1 through stage 4 chronic kidney disease, or unspecified chronic kidney disease: Secondary | ICD-10-CM | POA: Diagnosis not present

## 2017-09-22 DIAGNOSIS — N183 Chronic kidney disease, stage 3 (moderate): Secondary | ICD-10-CM | POA: Diagnosis not present

## 2017-09-22 DIAGNOSIS — I2510 Atherosclerotic heart disease of native coronary artery without angina pectoris: Secondary | ICD-10-CM | POA: Diagnosis not present

## 2017-09-22 DIAGNOSIS — M6281 Muscle weakness (generalized): Secondary | ICD-10-CM | POA: Diagnosis not present

## 2017-09-22 DIAGNOSIS — F0390 Unspecified dementia without behavioral disturbance: Secondary | ICD-10-CM | POA: Diagnosis not present

## 2017-09-22 DIAGNOSIS — E43 Unspecified severe protein-calorie malnutrition: Secondary | ICD-10-CM | POA: Diagnosis not present

## 2017-09-22 NOTE — Telephone Encounter (Signed)
[facility] or any available agency. Thanks

## 2017-09-23 ENCOUNTER — Telehealth: Payer: Self-pay | Admitting: Family Medicine

## 2017-09-23 DIAGNOSIS — M6281 Muscle weakness (generalized): Secondary | ICD-10-CM | POA: Diagnosis not present

## 2017-09-23 DIAGNOSIS — F0390 Unspecified dementia without behavioral disturbance: Secondary | ICD-10-CM | POA: Diagnosis not present

## 2017-09-23 DIAGNOSIS — I129 Hypertensive chronic kidney disease with stage 1 through stage 4 chronic kidney disease, or unspecified chronic kidney disease: Secondary | ICD-10-CM | POA: Diagnosis not present

## 2017-09-23 DIAGNOSIS — I2510 Atherosclerotic heart disease of native coronary artery without angina pectoris: Secondary | ICD-10-CM | POA: Diagnosis not present

## 2017-09-23 DIAGNOSIS — N183 Chronic kidney disease, stage 3 (moderate): Secondary | ICD-10-CM | POA: Diagnosis not present

## 2017-09-23 DIAGNOSIS — E43 Unspecified severe protein-calorie malnutrition: Secondary | ICD-10-CM | POA: Diagnosis not present

## 2017-09-23 NOTE — Telephone Encounter (Signed)
Call placed to [facility] # [phone], regarding new referrals. Spoke with Roxanne in the referral department and she informed that that they are accepting new referrals even though patient currently lives in [place]. Roxanne asked that an order for evaluation needs to be placed by provider as well as fax over patient's profile, list of medications, medical history and last office notes. Fax number is [phone].   Call placed to patient's son to inform him of my call with Roxanne from [facility] regarding hospice care and how they will accept patient. Braggi stated that they are not interested in a hospice facility for his mother. He expressed that he needs a nurse to come to the house and care for his mom, like bathing her. Apologized to [place] and informed him that I will let patient's provider know of this. Will update provider of this.

## 2017-09-26 ENCOUNTER — Telehealth: Payer: Self-pay

## 2017-09-26 NOTE — Telephone Encounter (Signed)
Call placed to the patient's son, Jeannette Corpus, to clarify the hospice referral.  Explained to him that a referral for home hospice services can be made, his mother does not have to be admitted to a facility. Also explained that if his mother enrolls with hospice, the current home health services will stop and hospice will take over. He stated that he would like home hospice, he didn't want his mother going to a facility. He did not have preference for hospice agency and stated that he would like services to be started as soon as possible.   Call placed to [facility] # [phone] and spoke to [facility]. Referral placed. She stated that they have access to Epic and could obtain the needed information. Dr Jarold Song was in agreement to have the hospice doctor manage the patient's care.

## 2017-09-27 ENCOUNTER — Telehealth: Payer: Self-pay

## 2017-09-27 NOTE — Telephone Encounter (Signed)
Call received from [place], RN/[facility].  She explained that she assessed the patient and has discussed with the hospice doctor and they do not feel that she meets hospice criteria.  She explained that the family has aggressive goals for care. The patient is able to feed herself and speak for herself. They are still reviewing for palliative care program but she is not confident that the patient will be accepted.   The son is interested in finding a provider that will make home visits. The patient currently has a home health aide 2-3  days/week that is covered by medicare as per the son. She does not have medicaid that could provide long term services.  Ashlee Mack stated that she would inform this CM of a determination for their palliative care program.  She will also inquire if her agency is aware of a provider who make home visits.

## 2017-09-27 NOTE — Telephone Encounter (Signed)
Call received from [place], RN/[facility].  She stated that she has assessed the patient and spoke to their doctor and they do not feel that the patient meets hospice criteria. The family is still very aggressive about the patient's care. She reported that the patient is very thin and unkept. She is able to feed herself and speak for She said they are still reviewing for palliative care program but she is not confident the patient will be accepted.

## 2017-09-28 DIAGNOSIS — F0390 Unspecified dementia without behavioral disturbance: Secondary | ICD-10-CM | POA: Diagnosis not present

## 2017-09-28 DIAGNOSIS — N183 Chronic kidney disease, stage 3 (moderate): Secondary | ICD-10-CM | POA: Diagnosis not present

## 2017-09-28 DIAGNOSIS — M6281 Muscle weakness (generalized): Secondary | ICD-10-CM | POA: Diagnosis not present

## 2017-09-28 DIAGNOSIS — I129 Hypertensive chronic kidney disease with stage 1 through stage 4 chronic kidney disease, or unspecified chronic kidney disease: Secondary | ICD-10-CM | POA: Diagnosis not present

## 2017-09-28 DIAGNOSIS — I2510 Atherosclerotic heart disease of native coronary artery without angina pectoris: Secondary | ICD-10-CM | POA: Diagnosis not present

## 2017-09-28 DIAGNOSIS — E43 Unspecified severe protein-calorie malnutrition: Secondary | ICD-10-CM | POA: Diagnosis not present

## 2017-09-28 NOTE — Telephone Encounter (Signed)
Noted  

## 2017-09-29 DIAGNOSIS — E43 Unspecified severe protein-calorie malnutrition: Secondary | ICD-10-CM | POA: Diagnosis not present

## 2017-09-29 DIAGNOSIS — N183 Chronic kidney disease, stage 3 (moderate): Secondary | ICD-10-CM | POA: Diagnosis not present

## 2017-09-29 DIAGNOSIS — F0390 Unspecified dementia without behavioral disturbance: Secondary | ICD-10-CM | POA: Diagnosis not present

## 2017-09-29 DIAGNOSIS — M6281 Muscle weakness (generalized): Secondary | ICD-10-CM | POA: Diagnosis not present

## 2017-09-29 DIAGNOSIS — I129 Hypertensive chronic kidney disease with stage 1 through stage 4 chronic kidney disease, or unspecified chronic kidney disease: Secondary | ICD-10-CM | POA: Diagnosis not present

## 2017-09-29 DIAGNOSIS — I2510 Atherosclerotic heart disease of native coronary artery without angina pectoris: Secondary | ICD-10-CM | POA: Diagnosis not present

## 2017-09-30 DIAGNOSIS — N183 Chronic kidney disease, stage 3 (moderate): Secondary | ICD-10-CM | POA: Diagnosis not present

## 2017-09-30 DIAGNOSIS — I2510 Atherosclerotic heart disease of native coronary artery without angina pectoris: Secondary | ICD-10-CM | POA: Diagnosis not present

## 2017-09-30 DIAGNOSIS — E43 Unspecified severe protein-calorie malnutrition: Secondary | ICD-10-CM | POA: Diagnosis not present

## 2017-09-30 DIAGNOSIS — F0390 Unspecified dementia without behavioral disturbance: Secondary | ICD-10-CM | POA: Diagnosis not present

## 2017-09-30 DIAGNOSIS — I129 Hypertensive chronic kidney disease with stage 1 through stage 4 chronic kidney disease, or unspecified chronic kidney disease: Secondary | ICD-10-CM | POA: Diagnosis not present

## 2017-09-30 DIAGNOSIS — M6281 Muscle weakness (generalized): Secondary | ICD-10-CM | POA: Diagnosis not present

## 2017-10-03 DIAGNOSIS — M6281 Muscle weakness (generalized): Secondary | ICD-10-CM | POA: Diagnosis not present

## 2017-10-03 DIAGNOSIS — E43 Unspecified severe protein-calorie malnutrition: Secondary | ICD-10-CM | POA: Diagnosis not present

## 2017-10-03 DIAGNOSIS — I2510 Atherosclerotic heart disease of native coronary artery without angina pectoris: Secondary | ICD-10-CM | POA: Diagnosis not present

## 2017-10-03 DIAGNOSIS — I129 Hypertensive chronic kidney disease with stage 1 through stage 4 chronic kidney disease, or unspecified chronic kidney disease: Secondary | ICD-10-CM | POA: Diagnosis not present

## 2017-10-03 DIAGNOSIS — N183 Chronic kidney disease, stage 3 (moderate): Secondary | ICD-10-CM | POA: Diagnosis not present

## 2017-10-03 DIAGNOSIS — F0390 Unspecified dementia without behavioral disturbance: Secondary | ICD-10-CM | POA: Diagnosis not present

## 2017-10-06 ENCOUNTER — Telehealth: Payer: Self-pay | Admitting: Family Medicine

## 2017-10-06 NOTE — Telephone Encounter (Signed)
Bambi from hospice called and requested for paperwork to be faxed back as soon as possible. Fax # [phone] Call Back # [phone]

## 2017-10-10 NOTE — Telephone Encounter (Signed)
Call placed to [facility] and Hospice # [phone] and spoke to [facility] regarding the hospice paperwork. She explained that they received the referral for hospice from [facility] and have obtained clinical information from [facility] She also noted that she has already done a home assessment and determined that the patient is appropriate for hospice and the patient/family are in agreement.  She is requesting the PCP sign the required documentation and fax back to # [phone]. She noted that if the PCP is not going to continue to follow the patient , it can be noted on the referral for [facility] to follow the patient.

## 2017-10-10 NOTE — Telephone Encounter (Signed)
Paperwork will be faxed over once completed by PCP

## 2017-10-11 NOTE — Telephone Encounter (Signed)
Paper work is ready

## 2017-10-13 DIAGNOSIS — D551 Anemia due to other disorders of glutathione metabolism: Secondary | ICD-10-CM | POA: Diagnosis not present

## 2017-10-13 DIAGNOSIS — F0390 Unspecified dementia without behavioral disturbance: Secondary | ICD-10-CM | POA: Diagnosis not present

## 2017-10-13 DIAGNOSIS — I10 Essential (primary) hypertension: Secondary | ICD-10-CM | POA: Diagnosis not present

## 2017-10-13 DIAGNOSIS — N183 Chronic kidney disease, stage 3 (moderate): Secondary | ICD-10-CM | POA: Diagnosis not present

## 2017-10-13 DIAGNOSIS — G309 Alzheimer's disease, unspecified: Secondary | ICD-10-CM | POA: Diagnosis not present

## 2017-10-14 DIAGNOSIS — D551 Anemia due to other disorders of glutathione metabolism: Secondary | ICD-10-CM | POA: Diagnosis not present

## 2017-10-14 DIAGNOSIS — N183 Chronic kidney disease, stage 3 (moderate): Secondary | ICD-10-CM | POA: Diagnosis not present

## 2017-10-14 DIAGNOSIS — G309 Alzheimer's disease, unspecified: Secondary | ICD-10-CM | POA: Diagnosis not present

## 2017-10-14 DIAGNOSIS — F0390 Unspecified dementia without behavioral disturbance: Secondary | ICD-10-CM | POA: Diagnosis not present

## 2017-10-14 DIAGNOSIS — I10 Essential (primary) hypertension: Secondary | ICD-10-CM | POA: Diagnosis not present

## 2017-10-17 DIAGNOSIS — D551 Anemia due to other disorders of glutathione metabolism: Secondary | ICD-10-CM | POA: Diagnosis not present

## 2017-10-17 DIAGNOSIS — N183 Chronic kidney disease, stage 3 (moderate): Secondary | ICD-10-CM | POA: Diagnosis not present

## 2017-10-17 DIAGNOSIS — I10 Essential (primary) hypertension: Secondary | ICD-10-CM | POA: Diagnosis not present

## 2017-10-17 DIAGNOSIS — F0390 Unspecified dementia without behavioral disturbance: Secondary | ICD-10-CM | POA: Diagnosis not present

## 2017-10-17 DIAGNOSIS — G309 Alzheimer's disease, unspecified: Secondary | ICD-10-CM | POA: Diagnosis not present

## 2017-10-18 DIAGNOSIS — G309 Alzheimer's disease, unspecified: Secondary | ICD-10-CM | POA: Diagnosis not present

## 2017-10-18 DIAGNOSIS — F0390 Unspecified dementia without behavioral disturbance: Secondary | ICD-10-CM | POA: Diagnosis not present

## 2017-10-18 DIAGNOSIS — D551 Anemia due to other disorders of glutathione metabolism: Secondary | ICD-10-CM | POA: Diagnosis not present

## 2017-10-18 DIAGNOSIS — I10 Essential (primary) hypertension: Secondary | ICD-10-CM | POA: Diagnosis not present

## 2017-10-18 DIAGNOSIS — N183 Chronic kidney disease, stage 3 (moderate): Secondary | ICD-10-CM | POA: Diagnosis not present

## 2017-10-19 DIAGNOSIS — G309 Alzheimer's disease, unspecified: Secondary | ICD-10-CM | POA: Diagnosis not present

## 2017-10-19 DIAGNOSIS — E43 Unspecified severe protein-calorie malnutrition: Secondary | ICD-10-CM | POA: Diagnosis not present

## 2017-10-19 DIAGNOSIS — F0390 Unspecified dementia without behavioral disturbance: Secondary | ICD-10-CM | POA: Diagnosis not present

## 2017-10-19 DIAGNOSIS — R627 Adult failure to thrive: Secondary | ICD-10-CM | POA: Diagnosis not present

## 2017-10-19 DIAGNOSIS — N183 Chronic kidney disease, stage 3 (moderate): Secondary | ICD-10-CM | POA: Diagnosis not present

## 2017-10-19 DIAGNOSIS — M6281 Muscle weakness (generalized): Secondary | ICD-10-CM | POA: Diagnosis not present

## 2017-10-19 DIAGNOSIS — E782 Mixed hyperlipidemia: Secondary | ICD-10-CM | POA: Diagnosis not present

## 2017-10-19 DIAGNOSIS — D551 Anemia due to other disorders of glutathione metabolism: Secondary | ICD-10-CM | POA: Diagnosis not present

## 2017-10-19 DIAGNOSIS — I10 Essential (primary) hypertension: Secondary | ICD-10-CM | POA: Diagnosis not present

## 2017-10-20 DIAGNOSIS — D551 Anemia due to other disorders of glutathione metabolism: Secondary | ICD-10-CM | POA: Diagnosis not present

## 2017-10-20 DIAGNOSIS — N183 Chronic kidney disease, stage 3 (moderate): Secondary | ICD-10-CM | POA: Diagnosis not present

## 2017-10-20 DIAGNOSIS — I10 Essential (primary) hypertension: Secondary | ICD-10-CM | POA: Diagnosis not present

## 2017-10-20 DIAGNOSIS — F0390 Unspecified dementia without behavioral disturbance: Secondary | ICD-10-CM | POA: Diagnosis not present

## 2017-10-20 DIAGNOSIS — G309 Alzheimer's disease, unspecified: Secondary | ICD-10-CM | POA: Diagnosis not present

## 2017-10-21 DIAGNOSIS — I10 Essential (primary) hypertension: Secondary | ICD-10-CM | POA: Diagnosis not present

## 2017-10-21 DIAGNOSIS — F0390 Unspecified dementia without behavioral disturbance: Secondary | ICD-10-CM | POA: Diagnosis not present

## 2017-10-21 DIAGNOSIS — D551 Anemia due to other disorders of glutathione metabolism: Secondary | ICD-10-CM | POA: Diagnosis not present

## 2017-10-21 DIAGNOSIS — G309 Alzheimer's disease, unspecified: Secondary | ICD-10-CM | POA: Diagnosis not present

## 2017-10-21 DIAGNOSIS — N183 Chronic kidney disease, stage 3 (moderate): Secondary | ICD-10-CM | POA: Diagnosis not present

## 2017-10-24 DIAGNOSIS — N183 Chronic kidney disease, stage 3 (moderate): Secondary | ICD-10-CM | POA: Diagnosis not present

## 2017-10-24 DIAGNOSIS — F0390 Unspecified dementia without behavioral disturbance: Secondary | ICD-10-CM | POA: Diagnosis not present

## 2017-10-24 DIAGNOSIS — D551 Anemia due to other disorders of glutathione metabolism: Secondary | ICD-10-CM | POA: Diagnosis not present

## 2017-10-24 DIAGNOSIS — I10 Essential (primary) hypertension: Secondary | ICD-10-CM | POA: Diagnosis not present

## 2017-10-24 DIAGNOSIS — G309 Alzheimer's disease, unspecified: Secondary | ICD-10-CM | POA: Diagnosis not present

## 2017-10-25 DIAGNOSIS — G309 Alzheimer's disease, unspecified: Secondary | ICD-10-CM | POA: Diagnosis not present

## 2017-10-25 DIAGNOSIS — F0390 Unspecified dementia without behavioral disturbance: Secondary | ICD-10-CM | POA: Diagnosis not present

## 2017-10-25 DIAGNOSIS — I10 Essential (primary) hypertension: Secondary | ICD-10-CM | POA: Diagnosis not present

## 2017-10-25 DIAGNOSIS — N183 Chronic kidney disease, stage 3 (moderate): Secondary | ICD-10-CM | POA: Diagnosis not present

## 2017-10-25 DIAGNOSIS — D551 Anemia due to other disorders of glutathione metabolism: Secondary | ICD-10-CM | POA: Diagnosis not present

## 2017-10-26 DIAGNOSIS — F0390 Unspecified dementia without behavioral disturbance: Secondary | ICD-10-CM | POA: Diagnosis not present

## 2017-10-26 DIAGNOSIS — G309 Alzheimer's disease, unspecified: Secondary | ICD-10-CM | POA: Diagnosis not present

## 2017-10-26 DIAGNOSIS — D551 Anemia due to other disorders of glutathione metabolism: Secondary | ICD-10-CM | POA: Diagnosis not present

## 2017-10-26 DIAGNOSIS — N183 Chronic kidney disease, stage 3 (moderate): Secondary | ICD-10-CM | POA: Diagnosis not present

## 2017-10-26 DIAGNOSIS — I10 Essential (primary) hypertension: Secondary | ICD-10-CM | POA: Diagnosis not present

## 2017-10-27 DIAGNOSIS — G309 Alzheimer's disease, unspecified: Secondary | ICD-10-CM | POA: Diagnosis not present

## 2017-10-27 DIAGNOSIS — N183 Chronic kidney disease, stage 3 (moderate): Secondary | ICD-10-CM | POA: Diagnosis not present

## 2017-10-27 DIAGNOSIS — F0390 Unspecified dementia without behavioral disturbance: Secondary | ICD-10-CM | POA: Diagnosis not present

## 2017-10-27 DIAGNOSIS — I10 Essential (primary) hypertension: Secondary | ICD-10-CM | POA: Diagnosis not present

## 2017-10-27 DIAGNOSIS — D551 Anemia due to other disorders of glutathione metabolism: Secondary | ICD-10-CM | POA: Diagnosis not present

## 2017-10-28 DIAGNOSIS — N183 Chronic kidney disease, stage 3 (moderate): Secondary | ICD-10-CM | POA: Diagnosis not present

## 2017-10-28 DIAGNOSIS — D551 Anemia due to other disorders of glutathione metabolism: Secondary | ICD-10-CM | POA: Diagnosis not present

## 2017-10-28 DIAGNOSIS — I10 Essential (primary) hypertension: Secondary | ICD-10-CM | POA: Diagnosis not present

## 2017-10-28 DIAGNOSIS — G309 Alzheimer's disease, unspecified: Secondary | ICD-10-CM | POA: Diagnosis not present

## 2017-10-28 DIAGNOSIS — F0390 Unspecified dementia without behavioral disturbance: Secondary | ICD-10-CM | POA: Diagnosis not present

## 2017-10-31 DIAGNOSIS — N183 Chronic kidney disease, stage 3 (moderate): Secondary | ICD-10-CM | POA: Diagnosis not present

## 2017-10-31 DIAGNOSIS — I10 Essential (primary) hypertension: Secondary | ICD-10-CM | POA: Diagnosis not present

## 2017-10-31 DIAGNOSIS — F0390 Unspecified dementia without behavioral disturbance: Secondary | ICD-10-CM | POA: Diagnosis not present

## 2017-10-31 DIAGNOSIS — D551 Anemia due to other disorders of glutathione metabolism: Secondary | ICD-10-CM | POA: Diagnosis not present

## 2017-10-31 DIAGNOSIS — G309 Alzheimer's disease, unspecified: Secondary | ICD-10-CM | POA: Diagnosis not present

## 2017-11-01 DIAGNOSIS — D551 Anemia due to other disorders of glutathione metabolism: Secondary | ICD-10-CM | POA: Diagnosis not present

## 2017-11-01 DIAGNOSIS — N183 Chronic kidney disease, stage 3 (moderate): Secondary | ICD-10-CM | POA: Diagnosis not present

## 2017-11-01 DIAGNOSIS — G309 Alzheimer's disease, unspecified: Secondary | ICD-10-CM | POA: Diagnosis not present

## 2017-11-01 DIAGNOSIS — I10 Essential (primary) hypertension: Secondary | ICD-10-CM | POA: Diagnosis not present

## 2017-11-01 DIAGNOSIS — F0390 Unspecified dementia without behavioral disturbance: Secondary | ICD-10-CM | POA: Diagnosis not present

## 2017-11-02 DIAGNOSIS — G309 Alzheimer's disease, unspecified: Secondary | ICD-10-CM | POA: Diagnosis not present

## 2017-11-02 DIAGNOSIS — D551 Anemia due to other disorders of glutathione metabolism: Secondary | ICD-10-CM | POA: Diagnosis not present

## 2017-11-02 DIAGNOSIS — N183 Chronic kidney disease, stage 3 (moderate): Secondary | ICD-10-CM | POA: Diagnosis not present

## 2017-11-02 DIAGNOSIS — F0390 Unspecified dementia without behavioral disturbance: Secondary | ICD-10-CM | POA: Diagnosis not present

## 2017-11-02 DIAGNOSIS — I10 Essential (primary) hypertension: Secondary | ICD-10-CM | POA: Diagnosis not present

## 2017-11-03 DIAGNOSIS — F0390 Unspecified dementia without behavioral disturbance: Secondary | ICD-10-CM | POA: Diagnosis not present

## 2017-11-03 DIAGNOSIS — G309 Alzheimer's disease, unspecified: Secondary | ICD-10-CM | POA: Diagnosis not present

## 2017-11-03 DIAGNOSIS — D551 Anemia due to other disorders of glutathione metabolism: Secondary | ICD-10-CM | POA: Diagnosis not present

## 2017-11-03 DIAGNOSIS — N183 Chronic kidney disease, stage 3 (moderate): Secondary | ICD-10-CM | POA: Diagnosis not present

## 2017-11-03 DIAGNOSIS — I10 Essential (primary) hypertension: Secondary | ICD-10-CM | POA: Diagnosis not present

## 2017-11-04 DIAGNOSIS — N183 Chronic kidney disease, stage 3 (moderate): Secondary | ICD-10-CM | POA: Diagnosis not present

## 2017-11-04 DIAGNOSIS — I10 Essential (primary) hypertension: Secondary | ICD-10-CM | POA: Diagnosis not present

## 2017-11-04 DIAGNOSIS — F0390 Unspecified dementia without behavioral disturbance: Secondary | ICD-10-CM | POA: Diagnosis not present

## 2017-11-04 DIAGNOSIS — D551 Anemia due to other disorders of glutathione metabolism: Secondary | ICD-10-CM | POA: Diagnosis not present

## 2017-11-04 DIAGNOSIS — G309 Alzheimer's disease, unspecified: Secondary | ICD-10-CM | POA: Diagnosis not present

## 2017-11-07 DIAGNOSIS — G309 Alzheimer's disease, unspecified: Secondary | ICD-10-CM | POA: Diagnosis not present

## 2017-11-07 DIAGNOSIS — N183 Chronic kidney disease, stage 3 (moderate): Secondary | ICD-10-CM | POA: Diagnosis not present

## 2017-11-07 DIAGNOSIS — I10 Essential (primary) hypertension: Secondary | ICD-10-CM | POA: Diagnosis not present

## 2017-11-07 DIAGNOSIS — F0390 Unspecified dementia without behavioral disturbance: Secondary | ICD-10-CM | POA: Diagnosis not present

## 2017-11-07 DIAGNOSIS — D551 Anemia due to other disorders of glutathione metabolism: Secondary | ICD-10-CM | POA: Diagnosis not present

## 2017-11-08 DIAGNOSIS — I10 Essential (primary) hypertension: Secondary | ICD-10-CM | POA: Diagnosis not present

## 2017-11-08 DIAGNOSIS — D551 Anemia due to other disorders of glutathione metabolism: Secondary | ICD-10-CM | POA: Diagnosis not present

## 2017-11-08 DIAGNOSIS — F0390 Unspecified dementia without behavioral disturbance: Secondary | ICD-10-CM | POA: Diagnosis not present

## 2017-11-08 DIAGNOSIS — N183 Chronic kidney disease, stage 3 (moderate): Secondary | ICD-10-CM | POA: Diagnosis not present

## 2017-11-08 DIAGNOSIS — G309 Alzheimer's disease, unspecified: Secondary | ICD-10-CM | POA: Diagnosis not present

## 2017-11-09 DIAGNOSIS — D551 Anemia due to other disorders of glutathione metabolism: Secondary | ICD-10-CM | POA: Diagnosis not present

## 2017-11-09 DIAGNOSIS — F0390 Unspecified dementia without behavioral disturbance: Secondary | ICD-10-CM | POA: Diagnosis not present

## 2017-11-09 DIAGNOSIS — N183 Chronic kidney disease, stage 3 (moderate): Secondary | ICD-10-CM | POA: Diagnosis not present

## 2017-11-09 DIAGNOSIS — I10 Essential (primary) hypertension: Secondary | ICD-10-CM | POA: Diagnosis not present

## 2017-11-09 DIAGNOSIS — G309 Alzheimer's disease, unspecified: Secondary | ICD-10-CM | POA: Diagnosis not present

## 2017-11-10 DIAGNOSIS — G309 Alzheimer's disease, unspecified: Secondary | ICD-10-CM | POA: Diagnosis not present

## 2017-11-10 DIAGNOSIS — L84 Corns and callosities: Secondary | ICD-10-CM | POA: Diagnosis not present

## 2017-11-10 DIAGNOSIS — N183 Chronic kidney disease, stage 3 (moderate): Secondary | ICD-10-CM | POA: Diagnosis not present

## 2017-11-10 DIAGNOSIS — F0390 Unspecified dementia without behavioral disturbance: Secondary | ICD-10-CM | POA: Diagnosis not present

## 2017-11-10 DIAGNOSIS — I739 Peripheral vascular disease, unspecified: Secondary | ICD-10-CM | POA: Diagnosis not present

## 2017-11-10 DIAGNOSIS — M79675 Pain in left toe(s): Secondary | ICD-10-CM | POA: Diagnosis not present

## 2017-11-10 DIAGNOSIS — B351 Tinea unguium: Secondary | ICD-10-CM | POA: Diagnosis not present

## 2017-11-10 DIAGNOSIS — D551 Anemia due to other disorders of glutathione metabolism: Secondary | ICD-10-CM | POA: Diagnosis not present

## 2017-11-10 DIAGNOSIS — I10 Essential (primary) hypertension: Secondary | ICD-10-CM | POA: Diagnosis not present

## 2017-11-11 DIAGNOSIS — I10 Essential (primary) hypertension: Secondary | ICD-10-CM | POA: Diagnosis not present

## 2017-11-11 DIAGNOSIS — F0390 Unspecified dementia without behavioral disturbance: Secondary | ICD-10-CM | POA: Diagnosis not present

## 2017-11-11 DIAGNOSIS — N183 Chronic kidney disease, stage 3 (moderate): Secondary | ICD-10-CM | POA: Diagnosis not present

## 2017-11-11 DIAGNOSIS — G309 Alzheimer's disease, unspecified: Secondary | ICD-10-CM | POA: Diagnosis not present

## 2017-11-11 DIAGNOSIS — D551 Anemia due to other disorders of glutathione metabolism: Secondary | ICD-10-CM | POA: Diagnosis not present

## 2017-11-14 DIAGNOSIS — D551 Anemia due to other disorders of glutathione metabolism: Secondary | ICD-10-CM | POA: Diagnosis not present

## 2017-11-14 DIAGNOSIS — G309 Alzheimer's disease, unspecified: Secondary | ICD-10-CM | POA: Diagnosis not present

## 2017-11-14 DIAGNOSIS — I10 Essential (primary) hypertension: Secondary | ICD-10-CM | POA: Diagnosis not present

## 2017-11-14 DIAGNOSIS — F0390 Unspecified dementia without behavioral disturbance: Secondary | ICD-10-CM | POA: Diagnosis not present

## 2017-11-14 DIAGNOSIS — N183 Chronic kidney disease, stage 3 (moderate): Secondary | ICD-10-CM | POA: Diagnosis not present

## 2017-11-15 DIAGNOSIS — F0390 Unspecified dementia without behavioral disturbance: Secondary | ICD-10-CM | POA: Diagnosis not present

## 2017-11-15 DIAGNOSIS — N183 Chronic kidney disease, stage 3 (moderate): Secondary | ICD-10-CM | POA: Diagnosis not present

## 2017-11-15 DIAGNOSIS — I10 Essential (primary) hypertension: Secondary | ICD-10-CM | POA: Diagnosis not present

## 2017-11-15 DIAGNOSIS — G309 Alzheimer's disease, unspecified: Secondary | ICD-10-CM | POA: Diagnosis not present

## 2017-11-15 DIAGNOSIS — D551 Anemia due to other disorders of glutathione metabolism: Secondary | ICD-10-CM | POA: Diagnosis not present

## 2017-11-16 DIAGNOSIS — F0390 Unspecified dementia without behavioral disturbance: Secondary | ICD-10-CM | POA: Diagnosis not present

## 2017-11-16 DIAGNOSIS — N183 Chronic kidney disease, stage 3 (moderate): Secondary | ICD-10-CM | POA: Diagnosis not present

## 2017-11-16 DIAGNOSIS — I10 Essential (primary) hypertension: Secondary | ICD-10-CM | POA: Diagnosis not present

## 2017-11-16 DIAGNOSIS — G309 Alzheimer's disease, unspecified: Secondary | ICD-10-CM | POA: Diagnosis not present

## 2017-11-16 DIAGNOSIS — D551 Anemia due to other disorders of glutathione metabolism: Secondary | ICD-10-CM | POA: Diagnosis not present

## 2017-11-17 DIAGNOSIS — N183 Chronic kidney disease, stage 3 (moderate): Secondary | ICD-10-CM | POA: Diagnosis not present

## 2017-11-17 DIAGNOSIS — G309 Alzheimer's disease, unspecified: Secondary | ICD-10-CM | POA: Diagnosis not present

## 2017-11-17 DIAGNOSIS — D551 Anemia due to other disorders of glutathione metabolism: Secondary | ICD-10-CM | POA: Diagnosis not present

## 2017-11-17 DIAGNOSIS — I10 Essential (primary) hypertension: Secondary | ICD-10-CM | POA: Diagnosis not present

## 2017-11-17 DIAGNOSIS — F0390 Unspecified dementia without behavioral disturbance: Secondary | ICD-10-CM | POA: Diagnosis not present

## 2017-11-18 DIAGNOSIS — D551 Anemia due to other disorders of glutathione metabolism: Secondary | ICD-10-CM | POA: Diagnosis not present

## 2017-11-18 DIAGNOSIS — I10 Essential (primary) hypertension: Secondary | ICD-10-CM | POA: Diagnosis not present

## 2017-11-18 DIAGNOSIS — F0390 Unspecified dementia without behavioral disturbance: Secondary | ICD-10-CM | POA: Diagnosis not present

## 2017-11-18 DIAGNOSIS — N183 Chronic kidney disease, stage 3 (moderate): Secondary | ICD-10-CM | POA: Diagnosis not present

## 2017-11-18 DIAGNOSIS — G309 Alzheimer's disease, unspecified: Secondary | ICD-10-CM | POA: Diagnosis not present

## 2017-11-21 DIAGNOSIS — D551 Anemia due to other disorders of glutathione metabolism: Secondary | ICD-10-CM | POA: Diagnosis not present

## 2017-11-21 DIAGNOSIS — G309 Alzheimer's disease, unspecified: Secondary | ICD-10-CM | POA: Diagnosis not present

## 2017-11-21 DIAGNOSIS — N183 Chronic kidney disease, stage 3 (moderate): Secondary | ICD-10-CM | POA: Diagnosis not present

## 2017-11-21 DIAGNOSIS — I10 Essential (primary) hypertension: Secondary | ICD-10-CM | POA: Diagnosis not present

## 2017-11-21 DIAGNOSIS — F0390 Unspecified dementia without behavioral disturbance: Secondary | ICD-10-CM | POA: Diagnosis not present

## 2017-11-22 DIAGNOSIS — N183 Chronic kidney disease, stage 3 (moderate): Secondary | ICD-10-CM | POA: Diagnosis not present

## 2017-11-22 DIAGNOSIS — G309 Alzheimer's disease, unspecified: Secondary | ICD-10-CM | POA: Diagnosis not present

## 2017-11-22 DIAGNOSIS — I10 Essential (primary) hypertension: Secondary | ICD-10-CM | POA: Diagnosis not present

## 2017-11-22 DIAGNOSIS — F0390 Unspecified dementia without behavioral disturbance: Secondary | ICD-10-CM | POA: Diagnosis not present

## 2017-11-22 DIAGNOSIS — D551 Anemia due to other disorders of glutathione metabolism: Secondary | ICD-10-CM | POA: Diagnosis not present

## 2017-11-23 DIAGNOSIS — F0390 Unspecified dementia without behavioral disturbance: Secondary | ICD-10-CM | POA: Diagnosis not present

## 2017-11-23 DIAGNOSIS — D551 Anemia due to other disorders of glutathione metabolism: Secondary | ICD-10-CM | POA: Diagnosis not present

## 2017-11-23 DIAGNOSIS — G309 Alzheimer's disease, unspecified: Secondary | ICD-10-CM | POA: Diagnosis not present

## 2017-11-23 DIAGNOSIS — I10 Essential (primary) hypertension: Secondary | ICD-10-CM | POA: Diagnosis not present

## 2017-11-23 DIAGNOSIS — N183 Chronic kidney disease, stage 3 (moderate): Secondary | ICD-10-CM | POA: Diagnosis not present

## 2017-11-24 DIAGNOSIS — F0390 Unspecified dementia without behavioral disturbance: Secondary | ICD-10-CM | POA: Diagnosis not present

## 2017-11-24 DIAGNOSIS — D551 Anemia due to other disorders of glutathione metabolism: Secondary | ICD-10-CM | POA: Diagnosis not present

## 2017-11-24 DIAGNOSIS — G309 Alzheimer's disease, unspecified: Secondary | ICD-10-CM | POA: Diagnosis not present

## 2017-11-24 DIAGNOSIS — I10 Essential (primary) hypertension: Secondary | ICD-10-CM | POA: Diagnosis not present

## 2017-11-24 DIAGNOSIS — N183 Chronic kidney disease, stage 3 (moderate): Secondary | ICD-10-CM | POA: Diagnosis not present

## 2017-11-25 DIAGNOSIS — N183 Chronic kidney disease, stage 3 (moderate): Secondary | ICD-10-CM | POA: Diagnosis not present

## 2017-11-25 DIAGNOSIS — F0390 Unspecified dementia without behavioral disturbance: Secondary | ICD-10-CM | POA: Diagnosis not present

## 2017-11-25 DIAGNOSIS — D551 Anemia due to other disorders of glutathione metabolism: Secondary | ICD-10-CM | POA: Diagnosis not present

## 2017-11-25 DIAGNOSIS — I10 Essential (primary) hypertension: Secondary | ICD-10-CM | POA: Diagnosis not present

## 2017-11-25 DIAGNOSIS — G309 Alzheimer's disease, unspecified: Secondary | ICD-10-CM | POA: Diagnosis not present

## 2017-11-28 DIAGNOSIS — I10 Essential (primary) hypertension: Secondary | ICD-10-CM | POA: Diagnosis not present

## 2017-11-28 DIAGNOSIS — D551 Anemia due to other disorders of glutathione metabolism: Secondary | ICD-10-CM | POA: Diagnosis not present

## 2017-11-28 DIAGNOSIS — F0390 Unspecified dementia without behavioral disturbance: Secondary | ICD-10-CM | POA: Diagnosis not present

## 2017-11-28 DIAGNOSIS — N183 Chronic kidney disease, stage 3 (moderate): Secondary | ICD-10-CM | POA: Diagnosis not present

## 2017-11-28 DIAGNOSIS — G309 Alzheimer's disease, unspecified: Secondary | ICD-10-CM | POA: Diagnosis not present

## 2017-11-29 DIAGNOSIS — D551 Anemia due to other disorders of glutathione metabolism: Secondary | ICD-10-CM | POA: Diagnosis not present

## 2017-11-29 DIAGNOSIS — N183 Chronic kidney disease, stage 3 (moderate): Secondary | ICD-10-CM | POA: Diagnosis not present

## 2017-11-29 DIAGNOSIS — G309 Alzheimer's disease, unspecified: Secondary | ICD-10-CM | POA: Diagnosis not present

## 2017-11-29 DIAGNOSIS — F0390 Unspecified dementia without behavioral disturbance: Secondary | ICD-10-CM | POA: Diagnosis not present

## 2017-11-29 DIAGNOSIS — I10 Essential (primary) hypertension: Secondary | ICD-10-CM | POA: Diagnosis not present

## 2017-11-30 DIAGNOSIS — G309 Alzheimer's disease, unspecified: Secondary | ICD-10-CM | POA: Diagnosis not present

## 2017-11-30 DIAGNOSIS — I10 Essential (primary) hypertension: Secondary | ICD-10-CM | POA: Diagnosis not present

## 2017-11-30 DIAGNOSIS — N183 Chronic kidney disease, stage 3 (moderate): Secondary | ICD-10-CM | POA: Diagnosis not present

## 2017-11-30 DIAGNOSIS — D551 Anemia due to other disorders of glutathione metabolism: Secondary | ICD-10-CM | POA: Diagnosis not present

## 2017-11-30 DIAGNOSIS — F0390 Unspecified dementia without behavioral disturbance: Secondary | ICD-10-CM | POA: Diagnosis not present

## 2017-12-01 DIAGNOSIS — G309 Alzheimer's disease, unspecified: Secondary | ICD-10-CM | POA: Diagnosis not present

## 2017-12-01 DIAGNOSIS — F0390 Unspecified dementia without behavioral disturbance: Secondary | ICD-10-CM | POA: Diagnosis not present

## 2017-12-01 DIAGNOSIS — N183 Chronic kidney disease, stage 3 (moderate): Secondary | ICD-10-CM | POA: Diagnosis not present

## 2017-12-01 DIAGNOSIS — D551 Anemia due to other disorders of glutathione metabolism: Secondary | ICD-10-CM | POA: Diagnosis not present

## 2017-12-01 DIAGNOSIS — I10 Essential (primary) hypertension: Secondary | ICD-10-CM | POA: Diagnosis not present

## 2017-12-02 DIAGNOSIS — G309 Alzheimer's disease, unspecified: Secondary | ICD-10-CM | POA: Diagnosis not present

## 2017-12-02 DIAGNOSIS — D551 Anemia due to other disorders of glutathione metabolism: Secondary | ICD-10-CM | POA: Diagnosis not present

## 2017-12-02 DIAGNOSIS — I10 Essential (primary) hypertension: Secondary | ICD-10-CM | POA: Diagnosis not present

## 2017-12-02 DIAGNOSIS — N183 Chronic kidney disease, stage 3 (moderate): Secondary | ICD-10-CM | POA: Diagnosis not present

## 2017-12-02 DIAGNOSIS — F0390 Unspecified dementia without behavioral disturbance: Secondary | ICD-10-CM | POA: Diagnosis not present

## 2017-12-05 DIAGNOSIS — G309 Alzheimer's disease, unspecified: Secondary | ICD-10-CM | POA: Diagnosis not present

## 2017-12-05 DIAGNOSIS — D551 Anemia due to other disorders of glutathione metabolism: Secondary | ICD-10-CM | POA: Diagnosis not present

## 2017-12-05 DIAGNOSIS — I10 Essential (primary) hypertension: Secondary | ICD-10-CM | POA: Diagnosis not present

## 2017-12-05 DIAGNOSIS — F0390 Unspecified dementia without behavioral disturbance: Secondary | ICD-10-CM | POA: Diagnosis not present

## 2017-12-05 DIAGNOSIS — N183 Chronic kidney disease, stage 3 (moderate): Secondary | ICD-10-CM | POA: Diagnosis not present

## 2017-12-06 DIAGNOSIS — N183 Chronic kidney disease, stage 3 (moderate): Secondary | ICD-10-CM | POA: Diagnosis not present

## 2017-12-06 DIAGNOSIS — F0390 Unspecified dementia without behavioral disturbance: Secondary | ICD-10-CM | POA: Diagnosis not present

## 2017-12-06 DIAGNOSIS — D551 Anemia due to other disorders of glutathione metabolism: Secondary | ICD-10-CM | POA: Diagnosis not present

## 2017-12-06 DIAGNOSIS — G309 Alzheimer's disease, unspecified: Secondary | ICD-10-CM | POA: Diagnosis not present

## 2017-12-06 DIAGNOSIS — I10 Essential (primary) hypertension: Secondary | ICD-10-CM | POA: Diagnosis not present

## 2017-12-07 DIAGNOSIS — D551 Anemia due to other disorders of glutathione metabolism: Secondary | ICD-10-CM | POA: Diagnosis not present

## 2017-12-07 DIAGNOSIS — F0390 Unspecified dementia without behavioral disturbance: Secondary | ICD-10-CM | POA: Diagnosis not present

## 2017-12-07 DIAGNOSIS — I10 Essential (primary) hypertension: Secondary | ICD-10-CM | POA: Diagnosis not present

## 2017-12-07 DIAGNOSIS — N183 Chronic kidney disease, stage 3 (moderate): Secondary | ICD-10-CM | POA: Diagnosis not present

## 2017-12-07 DIAGNOSIS — G309 Alzheimer's disease, unspecified: Secondary | ICD-10-CM | POA: Diagnosis not present

## 2017-12-08 DIAGNOSIS — F0390 Unspecified dementia without behavioral disturbance: Secondary | ICD-10-CM | POA: Diagnosis not present

## 2017-12-08 DIAGNOSIS — G309 Alzheimer's disease, unspecified: Secondary | ICD-10-CM | POA: Diagnosis not present

## 2017-12-08 DIAGNOSIS — D551 Anemia due to other disorders of glutathione metabolism: Secondary | ICD-10-CM | POA: Diagnosis not present

## 2017-12-08 DIAGNOSIS — N183 Chronic kidney disease, stage 3 (moderate): Secondary | ICD-10-CM | POA: Diagnosis not present

## 2017-12-08 DIAGNOSIS — I10 Essential (primary) hypertension: Secondary | ICD-10-CM | POA: Diagnosis not present

## 2017-12-09 DIAGNOSIS — G309 Alzheimer's disease, unspecified: Secondary | ICD-10-CM | POA: Diagnosis not present

## 2017-12-09 DIAGNOSIS — N183 Chronic kidney disease, stage 3 (moderate): Secondary | ICD-10-CM | POA: Diagnosis not present

## 2017-12-09 DIAGNOSIS — D551 Anemia due to other disorders of glutathione metabolism: Secondary | ICD-10-CM | POA: Diagnosis not present

## 2017-12-09 DIAGNOSIS — F0390 Unspecified dementia without behavioral disturbance: Secondary | ICD-10-CM | POA: Diagnosis not present

## 2017-12-09 DIAGNOSIS — I10 Essential (primary) hypertension: Secondary | ICD-10-CM | POA: Diagnosis not present

## 2017-12-12 DIAGNOSIS — N183 Chronic kidney disease, stage 3 (moderate): Secondary | ICD-10-CM | POA: Diagnosis not present

## 2017-12-12 DIAGNOSIS — D551 Anemia due to other disorders of glutathione metabolism: Secondary | ICD-10-CM | POA: Diagnosis not present

## 2017-12-12 DIAGNOSIS — G309 Alzheimer's disease, unspecified: Secondary | ICD-10-CM | POA: Diagnosis not present

## 2017-12-12 DIAGNOSIS — I10 Essential (primary) hypertension: Secondary | ICD-10-CM | POA: Diagnosis not present

## 2017-12-12 DIAGNOSIS — F0390 Unspecified dementia without behavioral disturbance: Secondary | ICD-10-CM | POA: Diagnosis not present

## 2017-12-13 DIAGNOSIS — I10 Essential (primary) hypertension: Secondary | ICD-10-CM | POA: Diagnosis not present

## 2017-12-13 DIAGNOSIS — F0390 Unspecified dementia without behavioral disturbance: Secondary | ICD-10-CM | POA: Diagnosis not present

## 2017-12-13 DIAGNOSIS — N183 Chronic kidney disease, stage 3 (moderate): Secondary | ICD-10-CM | POA: Diagnosis not present

## 2017-12-13 DIAGNOSIS — G309 Alzheimer's disease, unspecified: Secondary | ICD-10-CM | POA: Diagnosis not present

## 2017-12-13 DIAGNOSIS — D551 Anemia due to other disorders of glutathione metabolism: Secondary | ICD-10-CM | POA: Diagnosis not present

## 2017-12-14 DIAGNOSIS — G309 Alzheimer's disease, unspecified: Secondary | ICD-10-CM | POA: Diagnosis not present

## 2017-12-14 DIAGNOSIS — I10 Essential (primary) hypertension: Secondary | ICD-10-CM | POA: Diagnosis not present

## 2017-12-14 DIAGNOSIS — N183 Chronic kidney disease, stage 3 (moderate): Secondary | ICD-10-CM | POA: Diagnosis not present

## 2017-12-14 DIAGNOSIS — F0390 Unspecified dementia without behavioral disturbance: Secondary | ICD-10-CM | POA: Diagnosis not present

## 2017-12-14 DIAGNOSIS — D551 Anemia due to other disorders of glutathione metabolism: Secondary | ICD-10-CM | POA: Diagnosis not present

## 2017-12-15 DIAGNOSIS — F0390 Unspecified dementia without behavioral disturbance: Secondary | ICD-10-CM | POA: Diagnosis not present

## 2017-12-15 DIAGNOSIS — N183 Chronic kidney disease, stage 3 (moderate): Secondary | ICD-10-CM | POA: Diagnosis not present

## 2017-12-15 DIAGNOSIS — G309 Alzheimer's disease, unspecified: Secondary | ICD-10-CM | POA: Diagnosis not present

## 2017-12-15 DIAGNOSIS — I10 Essential (primary) hypertension: Secondary | ICD-10-CM | POA: Diagnosis not present

## 2017-12-15 DIAGNOSIS — D551 Anemia due to other disorders of glutathione metabolism: Secondary | ICD-10-CM | POA: Diagnosis not present

## 2017-12-16 DIAGNOSIS — N183 Chronic kidney disease, stage 3 (moderate): Secondary | ICD-10-CM | POA: Diagnosis not present

## 2017-12-16 DIAGNOSIS — I10 Essential (primary) hypertension: Secondary | ICD-10-CM | POA: Diagnosis not present

## 2017-12-16 DIAGNOSIS — D551 Anemia due to other disorders of glutathione metabolism: Secondary | ICD-10-CM | POA: Diagnosis not present

## 2017-12-16 DIAGNOSIS — F0390 Unspecified dementia without behavioral disturbance: Secondary | ICD-10-CM | POA: Diagnosis not present

## 2017-12-16 DIAGNOSIS — G309 Alzheimer's disease, unspecified: Secondary | ICD-10-CM | POA: Diagnosis not present

## 2017-12-19 DIAGNOSIS — I10 Essential (primary) hypertension: Secondary | ICD-10-CM | POA: Diagnosis not present

## 2017-12-19 DIAGNOSIS — F0390 Unspecified dementia without behavioral disturbance: Secondary | ICD-10-CM | POA: Diagnosis not present

## 2017-12-19 DIAGNOSIS — N183 Chronic kidney disease, stage 3 (moderate): Secondary | ICD-10-CM | POA: Diagnosis not present

## 2017-12-19 DIAGNOSIS — D551 Anemia due to other disorders of glutathione metabolism: Secondary | ICD-10-CM | POA: Diagnosis not present

## 2017-12-19 DIAGNOSIS — G309 Alzheimer's disease, unspecified: Secondary | ICD-10-CM | POA: Diagnosis not present

## 2017-12-20 DIAGNOSIS — I10 Essential (primary) hypertension: Secondary | ICD-10-CM | POA: Diagnosis not present

## 2017-12-20 DIAGNOSIS — G309 Alzheimer's disease, unspecified: Secondary | ICD-10-CM | POA: Diagnosis not present

## 2017-12-20 DIAGNOSIS — N183 Chronic kidney disease, stage 3 (moderate): Secondary | ICD-10-CM | POA: Diagnosis not present

## 2017-12-20 DIAGNOSIS — D551 Anemia due to other disorders of glutathione metabolism: Secondary | ICD-10-CM | POA: Diagnosis not present

## 2017-12-20 DIAGNOSIS — F0390 Unspecified dementia without behavioral disturbance: Secondary | ICD-10-CM | POA: Diagnosis not present

## 2017-12-21 DIAGNOSIS — D551 Anemia due to other disorders of glutathione metabolism: Secondary | ICD-10-CM | POA: Diagnosis not present

## 2017-12-21 DIAGNOSIS — N183 Chronic kidney disease, stage 3 (moderate): Secondary | ICD-10-CM | POA: Diagnosis not present

## 2017-12-21 DIAGNOSIS — G309 Alzheimer's disease, unspecified: Secondary | ICD-10-CM | POA: Diagnosis not present

## 2017-12-21 DIAGNOSIS — F0390 Unspecified dementia without behavioral disturbance: Secondary | ICD-10-CM | POA: Diagnosis not present

## 2017-12-21 DIAGNOSIS — I10 Essential (primary) hypertension: Secondary | ICD-10-CM | POA: Diagnosis not present

## 2017-12-22 DIAGNOSIS — I10 Essential (primary) hypertension: Secondary | ICD-10-CM | POA: Diagnosis not present

## 2017-12-22 DIAGNOSIS — F0390 Unspecified dementia without behavioral disturbance: Secondary | ICD-10-CM | POA: Diagnosis not present

## 2017-12-22 DIAGNOSIS — N183 Chronic kidney disease, stage 3 (moderate): Secondary | ICD-10-CM | POA: Diagnosis not present

## 2017-12-22 DIAGNOSIS — D551 Anemia due to other disorders of glutathione metabolism: Secondary | ICD-10-CM | POA: Diagnosis not present

## 2017-12-22 DIAGNOSIS — G309 Alzheimer's disease, unspecified: Secondary | ICD-10-CM | POA: Diagnosis not present

## 2017-12-23 DIAGNOSIS — I10 Essential (primary) hypertension: Secondary | ICD-10-CM | POA: Diagnosis not present

## 2017-12-23 DIAGNOSIS — N183 Chronic kidney disease, stage 3 (moderate): Secondary | ICD-10-CM | POA: Diagnosis not present

## 2017-12-23 DIAGNOSIS — G309 Alzheimer's disease, unspecified: Secondary | ICD-10-CM | POA: Diagnosis not present

## 2017-12-23 DIAGNOSIS — F0390 Unspecified dementia without behavioral disturbance: Secondary | ICD-10-CM | POA: Diagnosis not present

## 2017-12-23 DIAGNOSIS — D551 Anemia due to other disorders of glutathione metabolism: Secondary | ICD-10-CM | POA: Diagnosis not present

## 2017-12-26 DIAGNOSIS — I10 Essential (primary) hypertension: Secondary | ICD-10-CM | POA: Diagnosis not present

## 2017-12-26 DIAGNOSIS — D551 Anemia due to other disorders of glutathione metabolism: Secondary | ICD-10-CM | POA: Diagnosis not present

## 2017-12-26 DIAGNOSIS — G309 Alzheimer's disease, unspecified: Secondary | ICD-10-CM | POA: Diagnosis not present

## 2017-12-26 DIAGNOSIS — F0390 Unspecified dementia without behavioral disturbance: Secondary | ICD-10-CM | POA: Diagnosis not present

## 2017-12-26 DIAGNOSIS — N183 Chronic kidney disease, stage 3 (moderate): Secondary | ICD-10-CM | POA: Diagnosis not present

## 2017-12-27 DIAGNOSIS — I10 Essential (primary) hypertension: Secondary | ICD-10-CM | POA: Diagnosis not present

## 2017-12-27 DIAGNOSIS — D551 Anemia due to other disorders of glutathione metabolism: Secondary | ICD-10-CM | POA: Diagnosis not present

## 2017-12-27 DIAGNOSIS — N183 Chronic kidney disease, stage 3 (moderate): Secondary | ICD-10-CM | POA: Diagnosis not present

## 2017-12-27 DIAGNOSIS — G309 Alzheimer's disease, unspecified: Secondary | ICD-10-CM | POA: Diagnosis not present

## 2017-12-27 DIAGNOSIS — F0390 Unspecified dementia without behavioral disturbance: Secondary | ICD-10-CM | POA: Diagnosis not present

## 2017-12-28 DIAGNOSIS — N183 Chronic kidney disease, stage 3 (moderate): Secondary | ICD-10-CM | POA: Diagnosis not present

## 2017-12-28 DIAGNOSIS — G309 Alzheimer's disease, unspecified: Secondary | ICD-10-CM | POA: Diagnosis not present

## 2017-12-28 DIAGNOSIS — F0390 Unspecified dementia without behavioral disturbance: Secondary | ICD-10-CM | POA: Diagnosis not present

## 2017-12-28 DIAGNOSIS — D551 Anemia due to other disorders of glutathione metabolism: Secondary | ICD-10-CM | POA: Diagnosis not present

## 2017-12-28 DIAGNOSIS — I10 Essential (primary) hypertension: Secondary | ICD-10-CM | POA: Diagnosis not present

## 2017-12-29 DIAGNOSIS — I10 Essential (primary) hypertension: Secondary | ICD-10-CM | POA: Diagnosis not present

## 2017-12-29 DIAGNOSIS — D551 Anemia due to other disorders of glutathione metabolism: Secondary | ICD-10-CM | POA: Diagnosis not present

## 2017-12-29 DIAGNOSIS — N183 Chronic kidney disease, stage 3 (moderate): Secondary | ICD-10-CM | POA: Diagnosis not present

## 2017-12-29 DIAGNOSIS — F0390 Unspecified dementia without behavioral disturbance: Secondary | ICD-10-CM | POA: Diagnosis not present

## 2017-12-29 DIAGNOSIS — G309 Alzheimer's disease, unspecified: Secondary | ICD-10-CM | POA: Diagnosis not present

## 2017-12-30 DIAGNOSIS — G309 Alzheimer's disease, unspecified: Secondary | ICD-10-CM | POA: Diagnosis not present

## 2017-12-30 DIAGNOSIS — D551 Anemia due to other disorders of glutathione metabolism: Secondary | ICD-10-CM | POA: Diagnosis not present

## 2017-12-30 DIAGNOSIS — F0390 Unspecified dementia without behavioral disturbance: Secondary | ICD-10-CM | POA: Diagnosis not present

## 2017-12-30 DIAGNOSIS — N183 Chronic kidney disease, stage 3 (moderate): Secondary | ICD-10-CM | POA: Diagnosis not present

## 2017-12-30 DIAGNOSIS — I10 Essential (primary) hypertension: Secondary | ICD-10-CM | POA: Diagnosis not present

## 2018-01-02 DIAGNOSIS — I10 Essential (primary) hypertension: Secondary | ICD-10-CM | POA: Diagnosis not present

## 2018-01-02 DIAGNOSIS — G309 Alzheimer's disease, unspecified: Secondary | ICD-10-CM | POA: Diagnosis not present

## 2018-01-02 DIAGNOSIS — F0390 Unspecified dementia without behavioral disturbance: Secondary | ICD-10-CM | POA: Diagnosis not present

## 2018-01-02 DIAGNOSIS — N183 Chronic kidney disease, stage 3 (moderate): Secondary | ICD-10-CM | POA: Diagnosis not present

## 2018-01-02 DIAGNOSIS — D551 Anemia due to other disorders of glutathione metabolism: Secondary | ICD-10-CM | POA: Diagnosis not present

## 2018-01-03 DIAGNOSIS — I10 Essential (primary) hypertension: Secondary | ICD-10-CM | POA: Diagnosis not present

## 2018-01-03 DIAGNOSIS — F0390 Unspecified dementia without behavioral disturbance: Secondary | ICD-10-CM | POA: Diagnosis not present

## 2018-01-03 DIAGNOSIS — G309 Alzheimer's disease, unspecified: Secondary | ICD-10-CM | POA: Diagnosis not present

## 2018-01-03 DIAGNOSIS — N183 Chronic kidney disease, stage 3 (moderate): Secondary | ICD-10-CM | POA: Diagnosis not present

## 2018-01-03 DIAGNOSIS — D551 Anemia due to other disorders of glutathione metabolism: Secondary | ICD-10-CM | POA: Diagnosis not present

## 2018-01-04 DIAGNOSIS — D551 Anemia due to other disorders of glutathione metabolism: Secondary | ICD-10-CM | POA: Diagnosis not present

## 2018-01-04 DIAGNOSIS — F0390 Unspecified dementia without behavioral disturbance: Secondary | ICD-10-CM | POA: Diagnosis not present

## 2018-01-04 DIAGNOSIS — N183 Chronic kidney disease, stage 3 (moderate): Secondary | ICD-10-CM | POA: Diagnosis not present

## 2018-01-04 DIAGNOSIS — G309 Alzheimer's disease, unspecified: Secondary | ICD-10-CM | POA: Diagnosis not present

## 2018-01-04 DIAGNOSIS — I10 Essential (primary) hypertension: Secondary | ICD-10-CM | POA: Diagnosis not present

## 2018-01-05 DIAGNOSIS — D551 Anemia due to other disorders of glutathione metabolism: Secondary | ICD-10-CM | POA: Diagnosis not present

## 2018-01-05 DIAGNOSIS — I10 Essential (primary) hypertension: Secondary | ICD-10-CM | POA: Diagnosis not present

## 2018-01-05 DIAGNOSIS — F0390 Unspecified dementia without behavioral disturbance: Secondary | ICD-10-CM | POA: Diagnosis not present

## 2018-01-05 DIAGNOSIS — G309 Alzheimer's disease, unspecified: Secondary | ICD-10-CM | POA: Diagnosis not present

## 2018-01-05 DIAGNOSIS — N183 Chronic kidney disease, stage 3 (moderate): Secondary | ICD-10-CM | POA: Diagnosis not present

## 2018-01-06 DIAGNOSIS — I10 Essential (primary) hypertension: Secondary | ICD-10-CM | POA: Diagnosis not present

## 2018-01-06 DIAGNOSIS — D551 Anemia due to other disorders of glutathione metabolism: Secondary | ICD-10-CM | POA: Diagnosis not present

## 2018-01-06 DIAGNOSIS — N183 Chronic kidney disease, stage 3 (moderate): Secondary | ICD-10-CM | POA: Diagnosis not present

## 2018-01-06 DIAGNOSIS — F0390 Unspecified dementia without behavioral disturbance: Secondary | ICD-10-CM | POA: Diagnosis not present

## 2018-01-06 DIAGNOSIS — G309 Alzheimer's disease, unspecified: Secondary | ICD-10-CM | POA: Diagnosis not present

## 2018-01-09 DIAGNOSIS — G309 Alzheimer's disease, unspecified: Secondary | ICD-10-CM | POA: Diagnosis not present

## 2018-01-09 DIAGNOSIS — I10 Essential (primary) hypertension: Secondary | ICD-10-CM | POA: Diagnosis not present

## 2018-01-09 DIAGNOSIS — D551 Anemia due to other disorders of glutathione metabolism: Secondary | ICD-10-CM | POA: Diagnosis not present

## 2018-01-09 DIAGNOSIS — F0390 Unspecified dementia without behavioral disturbance: Secondary | ICD-10-CM | POA: Diagnosis not present

## 2018-01-09 DIAGNOSIS — N183 Chronic kidney disease, stage 3 (moderate): Secondary | ICD-10-CM | POA: Diagnosis not present

## 2018-01-10 DIAGNOSIS — I10 Essential (primary) hypertension: Secondary | ICD-10-CM | POA: Diagnosis not present

## 2018-01-10 DIAGNOSIS — F0390 Unspecified dementia without behavioral disturbance: Secondary | ICD-10-CM | POA: Diagnosis not present

## 2018-01-10 DIAGNOSIS — G309 Alzheimer's disease, unspecified: Secondary | ICD-10-CM | POA: Diagnosis not present

## 2018-01-10 DIAGNOSIS — N183 Chronic kidney disease, stage 3 (moderate): Secondary | ICD-10-CM | POA: Diagnosis not present

## 2018-01-10 DIAGNOSIS — D551 Anemia due to other disorders of glutathione metabolism: Secondary | ICD-10-CM | POA: Diagnosis not present

## 2018-01-11 DIAGNOSIS — D551 Anemia due to other disorders of glutathione metabolism: Secondary | ICD-10-CM | POA: Diagnosis not present

## 2018-01-11 DIAGNOSIS — F0390 Unspecified dementia without behavioral disturbance: Secondary | ICD-10-CM | POA: Diagnosis not present

## 2018-01-11 DIAGNOSIS — I10 Essential (primary) hypertension: Secondary | ICD-10-CM | POA: Diagnosis not present

## 2018-01-11 DIAGNOSIS — G309 Alzheimer's disease, unspecified: Secondary | ICD-10-CM | POA: Diagnosis not present

## 2018-01-11 DIAGNOSIS — N183 Chronic kidney disease, stage 3 (moderate): Secondary | ICD-10-CM | POA: Diagnosis not present

## 2018-01-12 DIAGNOSIS — D551 Anemia due to other disorders of glutathione metabolism: Secondary | ICD-10-CM | POA: Diagnosis not present

## 2018-01-12 DIAGNOSIS — N183 Chronic kidney disease, stage 3 (moderate): Secondary | ICD-10-CM | POA: Diagnosis not present

## 2018-01-12 DIAGNOSIS — I10 Essential (primary) hypertension: Secondary | ICD-10-CM | POA: Diagnosis not present

## 2018-01-12 DIAGNOSIS — F0390 Unspecified dementia without behavioral disturbance: Secondary | ICD-10-CM | POA: Diagnosis not present

## 2018-01-12 DIAGNOSIS — G309 Alzheimer's disease, unspecified: Secondary | ICD-10-CM | POA: Diagnosis not present

## 2018-01-13 DIAGNOSIS — F0390 Unspecified dementia without behavioral disturbance: Secondary | ICD-10-CM | POA: Diagnosis not present

## 2018-01-13 DIAGNOSIS — D551 Anemia due to other disorders of glutathione metabolism: Secondary | ICD-10-CM | POA: Diagnosis not present

## 2018-01-13 DIAGNOSIS — N183 Chronic kidney disease, stage 3 (moderate): Secondary | ICD-10-CM | POA: Diagnosis not present

## 2018-01-13 DIAGNOSIS — I10 Essential (primary) hypertension: Secondary | ICD-10-CM | POA: Diagnosis not present

## 2018-01-13 DIAGNOSIS — G309 Alzheimer's disease, unspecified: Secondary | ICD-10-CM | POA: Diagnosis not present

## 2018-01-16 DIAGNOSIS — I10 Essential (primary) hypertension: Secondary | ICD-10-CM | POA: Diagnosis not present

## 2018-01-16 DIAGNOSIS — G309 Alzheimer's disease, unspecified: Secondary | ICD-10-CM | POA: Diagnosis not present

## 2018-01-16 DIAGNOSIS — F0390 Unspecified dementia without behavioral disturbance: Secondary | ICD-10-CM | POA: Diagnosis not present

## 2018-01-16 DIAGNOSIS — D551 Anemia due to other disorders of glutathione metabolism: Secondary | ICD-10-CM | POA: Diagnosis not present

## 2018-01-16 DIAGNOSIS — N183 Chronic kidney disease, stage 3 (moderate): Secondary | ICD-10-CM | POA: Diagnosis not present

## 2018-01-17 DIAGNOSIS — G309 Alzheimer's disease, unspecified: Secondary | ICD-10-CM | POA: Diagnosis not present

## 2018-01-17 DIAGNOSIS — I10 Essential (primary) hypertension: Secondary | ICD-10-CM | POA: Diagnosis not present

## 2018-01-17 DIAGNOSIS — F0390 Unspecified dementia without behavioral disturbance: Secondary | ICD-10-CM | POA: Diagnosis not present

## 2018-01-17 DIAGNOSIS — N183 Chronic kidney disease, stage 3 (moderate): Secondary | ICD-10-CM | POA: Diagnosis not present

## 2018-01-17 DIAGNOSIS — D551 Anemia due to other disorders of glutathione metabolism: Secondary | ICD-10-CM | POA: Diagnosis not present

## 2018-01-18 DIAGNOSIS — G309 Alzheimer's disease, unspecified: Secondary | ICD-10-CM | POA: Diagnosis not present

## 2018-01-18 DIAGNOSIS — D551 Anemia due to other disorders of glutathione metabolism: Secondary | ICD-10-CM | POA: Diagnosis not present

## 2018-01-18 DIAGNOSIS — F0390 Unspecified dementia without behavioral disturbance: Secondary | ICD-10-CM | POA: Diagnosis not present

## 2018-01-18 DIAGNOSIS — I10 Essential (primary) hypertension: Secondary | ICD-10-CM | POA: Diagnosis not present

## 2018-01-18 DIAGNOSIS — N183 Chronic kidney disease, stage 3 (moderate): Secondary | ICD-10-CM | POA: Diagnosis not present

## 2018-01-19 DIAGNOSIS — N183 Chronic kidney disease, stage 3 (moderate): Secondary | ICD-10-CM | POA: Diagnosis not present

## 2018-01-19 DIAGNOSIS — D551 Anemia due to other disorders of glutathione metabolism: Secondary | ICD-10-CM | POA: Diagnosis not present

## 2018-01-19 DIAGNOSIS — I10 Essential (primary) hypertension: Secondary | ICD-10-CM | POA: Diagnosis not present

## 2018-01-19 DIAGNOSIS — G309 Alzheimer's disease, unspecified: Secondary | ICD-10-CM | POA: Diagnosis not present

## 2018-01-19 DIAGNOSIS — F0390 Unspecified dementia without behavioral disturbance: Secondary | ICD-10-CM | POA: Diagnosis not present

## 2018-01-20 DIAGNOSIS — F0390 Unspecified dementia without behavioral disturbance: Secondary | ICD-10-CM | POA: Diagnosis not present

## 2018-01-20 DIAGNOSIS — N183 Chronic kidney disease, stage 3 (moderate): Secondary | ICD-10-CM | POA: Diagnosis not present

## 2018-01-20 DIAGNOSIS — D551 Anemia due to other disorders of glutathione metabolism: Secondary | ICD-10-CM | POA: Diagnosis not present

## 2018-01-20 DIAGNOSIS — I10 Essential (primary) hypertension: Secondary | ICD-10-CM | POA: Diagnosis not present

## 2018-01-20 DIAGNOSIS — G309 Alzheimer's disease, unspecified: Secondary | ICD-10-CM | POA: Diagnosis not present

## 2018-01-23 DIAGNOSIS — F0390 Unspecified dementia without behavioral disturbance: Secondary | ICD-10-CM | POA: Diagnosis not present

## 2018-01-23 DIAGNOSIS — N183 Chronic kidney disease, stage 3 (moderate): Secondary | ICD-10-CM | POA: Diagnosis not present

## 2018-01-23 DIAGNOSIS — D551 Anemia due to other disorders of glutathione metabolism: Secondary | ICD-10-CM | POA: Diagnosis not present

## 2018-01-23 DIAGNOSIS — I10 Essential (primary) hypertension: Secondary | ICD-10-CM | POA: Diagnosis not present

## 2018-01-23 DIAGNOSIS — G309 Alzheimer's disease, unspecified: Secondary | ICD-10-CM | POA: Diagnosis not present

## 2018-01-24 DIAGNOSIS — N183 Chronic kidney disease, stage 3 (moderate): Secondary | ICD-10-CM | POA: Diagnosis not present

## 2018-01-24 DIAGNOSIS — I10 Essential (primary) hypertension: Secondary | ICD-10-CM | POA: Diagnosis not present

## 2018-01-24 DIAGNOSIS — D551 Anemia due to other disorders of glutathione metabolism: Secondary | ICD-10-CM | POA: Diagnosis not present

## 2018-01-24 DIAGNOSIS — G309 Alzheimer's disease, unspecified: Secondary | ICD-10-CM | POA: Diagnosis not present

## 2018-01-24 DIAGNOSIS — F0390 Unspecified dementia without behavioral disturbance: Secondary | ICD-10-CM | POA: Diagnosis not present

## 2018-01-25 DIAGNOSIS — G309 Alzheimer's disease, unspecified: Secondary | ICD-10-CM | POA: Diagnosis not present

## 2018-01-25 DIAGNOSIS — F0390 Unspecified dementia without behavioral disturbance: Secondary | ICD-10-CM | POA: Diagnosis not present

## 2018-01-25 DIAGNOSIS — D551 Anemia due to other disorders of glutathione metabolism: Secondary | ICD-10-CM | POA: Diagnosis not present

## 2018-01-25 DIAGNOSIS — N183 Chronic kidney disease, stage 3 (moderate): Secondary | ICD-10-CM | POA: Diagnosis not present

## 2018-01-25 DIAGNOSIS — I10 Essential (primary) hypertension: Secondary | ICD-10-CM | POA: Diagnosis not present

## 2018-01-26 DIAGNOSIS — G309 Alzheimer's disease, unspecified: Secondary | ICD-10-CM | POA: Diagnosis not present

## 2018-01-26 DIAGNOSIS — N183 Chronic kidney disease, stage 3 (moderate): Secondary | ICD-10-CM | POA: Diagnosis not present

## 2018-01-26 DIAGNOSIS — F0390 Unspecified dementia without behavioral disturbance: Secondary | ICD-10-CM | POA: Diagnosis not present

## 2018-01-26 DIAGNOSIS — I10 Essential (primary) hypertension: Secondary | ICD-10-CM | POA: Diagnosis not present

## 2018-01-26 DIAGNOSIS — D551 Anemia due to other disorders of glutathione metabolism: Secondary | ICD-10-CM | POA: Diagnosis not present

## 2018-01-27 DIAGNOSIS — I10 Essential (primary) hypertension: Secondary | ICD-10-CM | POA: Diagnosis not present

## 2018-01-27 DIAGNOSIS — F0390 Unspecified dementia without behavioral disturbance: Secondary | ICD-10-CM | POA: Diagnosis not present

## 2018-01-27 DIAGNOSIS — D551 Anemia due to other disorders of glutathione metabolism: Secondary | ICD-10-CM | POA: Diagnosis not present

## 2018-01-27 DIAGNOSIS — N183 Chronic kidney disease, stage 3 (moderate): Secondary | ICD-10-CM | POA: Diagnosis not present

## 2018-01-27 DIAGNOSIS — G309 Alzheimer's disease, unspecified: Secondary | ICD-10-CM | POA: Diagnosis not present

## 2018-01-30 DIAGNOSIS — D551 Anemia due to other disorders of glutathione metabolism: Secondary | ICD-10-CM | POA: Diagnosis not present

## 2018-01-30 DIAGNOSIS — G309 Alzheimer's disease, unspecified: Secondary | ICD-10-CM | POA: Diagnosis not present

## 2018-01-30 DIAGNOSIS — I10 Essential (primary) hypertension: Secondary | ICD-10-CM | POA: Diagnosis not present

## 2018-01-30 DIAGNOSIS — F0390 Unspecified dementia without behavioral disturbance: Secondary | ICD-10-CM | POA: Diagnosis not present

## 2018-01-30 DIAGNOSIS — N183 Chronic kidney disease, stage 3 (moderate): Secondary | ICD-10-CM | POA: Diagnosis not present

## 2018-01-31 DIAGNOSIS — F0390 Unspecified dementia without behavioral disturbance: Secondary | ICD-10-CM | POA: Diagnosis not present

## 2018-01-31 DIAGNOSIS — G309 Alzheimer's disease, unspecified: Secondary | ICD-10-CM | POA: Diagnosis not present

## 2018-01-31 DIAGNOSIS — D551 Anemia due to other disorders of glutathione metabolism: Secondary | ICD-10-CM | POA: Diagnosis not present

## 2018-01-31 DIAGNOSIS — N183 Chronic kidney disease, stage 3 (moderate): Secondary | ICD-10-CM | POA: Diagnosis not present

## 2018-01-31 DIAGNOSIS — I10 Essential (primary) hypertension: Secondary | ICD-10-CM | POA: Diagnosis not present

## 2018-02-01 DIAGNOSIS — N183 Chronic kidney disease, stage 3 (moderate): Secondary | ICD-10-CM | POA: Diagnosis not present

## 2018-02-01 DIAGNOSIS — F0390 Unspecified dementia without behavioral disturbance: Secondary | ICD-10-CM | POA: Diagnosis not present

## 2018-02-01 DIAGNOSIS — D551 Anemia due to other disorders of glutathione metabolism: Secondary | ICD-10-CM | POA: Diagnosis not present

## 2018-02-01 DIAGNOSIS — G309 Alzheimer's disease, unspecified: Secondary | ICD-10-CM | POA: Diagnosis not present

## 2018-02-01 DIAGNOSIS — I10 Essential (primary) hypertension: Secondary | ICD-10-CM | POA: Diagnosis not present

## 2018-02-02 DIAGNOSIS — F0390 Unspecified dementia without behavioral disturbance: Secondary | ICD-10-CM | POA: Diagnosis not present

## 2018-02-02 DIAGNOSIS — N183 Chronic kidney disease, stage 3 (moderate): Secondary | ICD-10-CM | POA: Diagnosis not present

## 2018-02-02 DIAGNOSIS — D551 Anemia due to other disorders of glutathione metabolism: Secondary | ICD-10-CM | POA: Diagnosis not present

## 2018-02-02 DIAGNOSIS — G309 Alzheimer's disease, unspecified: Secondary | ICD-10-CM | POA: Diagnosis not present

## 2018-02-02 DIAGNOSIS — I10 Essential (primary) hypertension: Secondary | ICD-10-CM | POA: Diagnosis not present

## 2018-02-03 DIAGNOSIS — G309 Alzheimer's disease, unspecified: Secondary | ICD-10-CM | POA: Diagnosis not present

## 2018-02-03 DIAGNOSIS — D551 Anemia due to other disorders of glutathione metabolism: Secondary | ICD-10-CM | POA: Diagnosis not present

## 2018-02-03 DIAGNOSIS — F0390 Unspecified dementia without behavioral disturbance: Secondary | ICD-10-CM | POA: Diagnosis not present

## 2018-02-03 DIAGNOSIS — I10 Essential (primary) hypertension: Secondary | ICD-10-CM | POA: Diagnosis not present

## 2018-02-03 DIAGNOSIS — N183 Chronic kidney disease, stage 3 (moderate): Secondary | ICD-10-CM | POA: Diagnosis not present

## 2018-02-07 DIAGNOSIS — G309 Alzheimer's disease, unspecified: Secondary | ICD-10-CM | POA: Diagnosis not present

## 2018-02-07 DIAGNOSIS — F0390 Unspecified dementia without behavioral disturbance: Secondary | ICD-10-CM | POA: Diagnosis not present

## 2018-02-07 DIAGNOSIS — N183 Chronic kidney disease, stage 3 (moderate): Secondary | ICD-10-CM | POA: Diagnosis not present

## 2018-02-07 DIAGNOSIS — D551 Anemia due to other disorders of glutathione metabolism: Secondary | ICD-10-CM | POA: Diagnosis not present

## 2018-02-07 DIAGNOSIS — I10 Essential (primary) hypertension: Secondary | ICD-10-CM | POA: Diagnosis not present

## 2018-02-08 DIAGNOSIS — I10 Essential (primary) hypertension: Secondary | ICD-10-CM | POA: Diagnosis not present

## 2018-02-08 DIAGNOSIS — D551 Anemia due to other disorders of glutathione metabolism: Secondary | ICD-10-CM | POA: Diagnosis not present

## 2018-02-08 DIAGNOSIS — F0390 Unspecified dementia without behavioral disturbance: Secondary | ICD-10-CM | POA: Diagnosis not present

## 2018-02-08 DIAGNOSIS — N183 Chronic kidney disease, stage 3 (moderate): Secondary | ICD-10-CM | POA: Diagnosis not present

## 2018-02-08 DIAGNOSIS — G309 Alzheimer's disease, unspecified: Secondary | ICD-10-CM | POA: Diagnosis not present

## 2018-02-09 DIAGNOSIS — F0390 Unspecified dementia without behavioral disturbance: Secondary | ICD-10-CM | POA: Diagnosis not present

## 2018-02-09 DIAGNOSIS — D551 Anemia due to other disorders of glutathione metabolism: Secondary | ICD-10-CM | POA: Diagnosis not present

## 2018-02-09 DIAGNOSIS — G309 Alzheimer's disease, unspecified: Secondary | ICD-10-CM | POA: Diagnosis not present

## 2018-02-09 DIAGNOSIS — N183 Chronic kidney disease, stage 3 (moderate): Secondary | ICD-10-CM | POA: Diagnosis not present

## 2018-02-09 DIAGNOSIS — I10 Essential (primary) hypertension: Secondary | ICD-10-CM | POA: Diagnosis not present

## 2018-02-10 DIAGNOSIS — F0390 Unspecified dementia without behavioral disturbance: Secondary | ICD-10-CM | POA: Diagnosis not present

## 2018-02-10 DIAGNOSIS — N183 Chronic kidney disease, stage 3 (moderate): Secondary | ICD-10-CM | POA: Diagnosis not present

## 2018-02-10 DIAGNOSIS — I10 Essential (primary) hypertension: Secondary | ICD-10-CM | POA: Diagnosis not present

## 2018-02-10 DIAGNOSIS — D551 Anemia due to other disorders of glutathione metabolism: Secondary | ICD-10-CM | POA: Diagnosis not present

## 2018-02-10 DIAGNOSIS — G309 Alzheimer's disease, unspecified: Secondary | ICD-10-CM | POA: Diagnosis not present

## 2018-02-11 DIAGNOSIS — F0390 Unspecified dementia without behavioral disturbance: Secondary | ICD-10-CM | POA: Diagnosis not present

## 2018-02-11 DIAGNOSIS — D551 Anemia due to other disorders of glutathione metabolism: Secondary | ICD-10-CM | POA: Diagnosis not present

## 2018-02-11 DIAGNOSIS — G309 Alzheimer's disease, unspecified: Secondary | ICD-10-CM | POA: Diagnosis not present

## 2018-02-11 DIAGNOSIS — N183 Chronic kidney disease, stage 3 (moderate): Secondary | ICD-10-CM | POA: Diagnosis not present

## 2018-02-11 DIAGNOSIS — I10 Essential (primary) hypertension: Secondary | ICD-10-CM | POA: Diagnosis not present

## 2018-02-13 DIAGNOSIS — N183 Chronic kidney disease, stage 3 (moderate): Secondary | ICD-10-CM | POA: Diagnosis not present

## 2018-02-13 DIAGNOSIS — D551 Anemia due to other disorders of glutathione metabolism: Secondary | ICD-10-CM | POA: Diagnosis not present

## 2018-02-13 DIAGNOSIS — I10 Essential (primary) hypertension: Secondary | ICD-10-CM | POA: Diagnosis not present

## 2018-02-13 DIAGNOSIS — F0390 Unspecified dementia without behavioral disturbance: Secondary | ICD-10-CM | POA: Diagnosis not present

## 2018-02-13 DIAGNOSIS — G309 Alzheimer's disease, unspecified: Secondary | ICD-10-CM | POA: Diagnosis not present

## 2018-02-14 DIAGNOSIS — I10 Essential (primary) hypertension: Secondary | ICD-10-CM | POA: Diagnosis not present

## 2018-02-14 DIAGNOSIS — N183 Chronic kidney disease, stage 3 (moderate): Secondary | ICD-10-CM | POA: Diagnosis not present

## 2018-02-14 DIAGNOSIS — D551 Anemia due to other disorders of glutathione metabolism: Secondary | ICD-10-CM | POA: Diagnosis not present

## 2018-02-14 DIAGNOSIS — G309 Alzheimer's disease, unspecified: Secondary | ICD-10-CM | POA: Diagnosis not present

## 2018-02-14 DIAGNOSIS — F0390 Unspecified dementia without behavioral disturbance: Secondary | ICD-10-CM | POA: Diagnosis not present

## 2018-02-15 DIAGNOSIS — I10 Essential (primary) hypertension: Secondary | ICD-10-CM | POA: Diagnosis not present

## 2018-02-15 DIAGNOSIS — F0390 Unspecified dementia without behavioral disturbance: Secondary | ICD-10-CM | POA: Diagnosis not present

## 2018-02-15 DIAGNOSIS — G309 Alzheimer's disease, unspecified: Secondary | ICD-10-CM | POA: Diagnosis not present

## 2018-02-15 DIAGNOSIS — N183 Chronic kidney disease, stage 3 (moderate): Secondary | ICD-10-CM | POA: Diagnosis not present

## 2018-02-15 DIAGNOSIS — D551 Anemia due to other disorders of glutathione metabolism: Secondary | ICD-10-CM | POA: Diagnosis not present

## 2018-02-16 DIAGNOSIS — I10 Essential (primary) hypertension: Secondary | ICD-10-CM | POA: Diagnosis not present

## 2018-02-16 DIAGNOSIS — N183 Chronic kidney disease, stage 3 (moderate): Secondary | ICD-10-CM | POA: Diagnosis not present

## 2018-02-16 DIAGNOSIS — D551 Anemia due to other disorders of glutathione metabolism: Secondary | ICD-10-CM | POA: Diagnosis not present

## 2018-02-16 DIAGNOSIS — G309 Alzheimer's disease, unspecified: Secondary | ICD-10-CM | POA: Diagnosis not present

## 2018-02-16 DIAGNOSIS — F0390 Unspecified dementia without behavioral disturbance: Secondary | ICD-10-CM | POA: Diagnosis not present

## 2018-02-17 DIAGNOSIS — F0390 Unspecified dementia without behavioral disturbance: Secondary | ICD-10-CM | POA: Diagnosis not present

## 2018-02-17 DIAGNOSIS — G309 Alzheimer's disease, unspecified: Secondary | ICD-10-CM | POA: Diagnosis not present

## 2018-02-17 DIAGNOSIS — N183 Chronic kidney disease, stage 3 (moderate): Secondary | ICD-10-CM | POA: Diagnosis not present

## 2018-02-17 DIAGNOSIS — I10 Essential (primary) hypertension: Secondary | ICD-10-CM | POA: Diagnosis not present

## 2018-02-17 DIAGNOSIS — D551 Anemia due to other disorders of glutathione metabolism: Secondary | ICD-10-CM | POA: Diagnosis not present

## 2018-02-20 DIAGNOSIS — N183 Chronic kidney disease, stage 3 (moderate): Secondary | ICD-10-CM | POA: Diagnosis not present

## 2018-02-20 DIAGNOSIS — G309 Alzheimer's disease, unspecified: Secondary | ICD-10-CM | POA: Diagnosis not present

## 2018-02-20 DIAGNOSIS — D551 Anemia due to other disorders of glutathione metabolism: Secondary | ICD-10-CM | POA: Diagnosis not present

## 2018-02-20 DIAGNOSIS — F0390 Unspecified dementia without behavioral disturbance: Secondary | ICD-10-CM | POA: Diagnosis not present

## 2018-02-20 DIAGNOSIS — I10 Essential (primary) hypertension: Secondary | ICD-10-CM | POA: Diagnosis not present

## 2018-02-21 DIAGNOSIS — G309 Alzheimer's disease, unspecified: Secondary | ICD-10-CM | POA: Diagnosis not present

## 2018-02-21 DIAGNOSIS — D551 Anemia due to other disorders of glutathione metabolism: Secondary | ICD-10-CM | POA: Diagnosis not present

## 2018-02-21 DIAGNOSIS — F0390 Unspecified dementia without behavioral disturbance: Secondary | ICD-10-CM | POA: Diagnosis not present

## 2018-02-21 DIAGNOSIS — I10 Essential (primary) hypertension: Secondary | ICD-10-CM | POA: Diagnosis not present

## 2018-02-21 DIAGNOSIS — N183 Chronic kidney disease, stage 3 (moderate): Secondary | ICD-10-CM | POA: Diagnosis not present

## 2018-02-22 DIAGNOSIS — F0390 Unspecified dementia without behavioral disturbance: Secondary | ICD-10-CM | POA: Diagnosis not present

## 2018-02-22 DIAGNOSIS — D551 Anemia due to other disorders of glutathione metabolism: Secondary | ICD-10-CM | POA: Diagnosis not present

## 2018-02-22 DIAGNOSIS — N183 Chronic kidney disease, stage 3 (moderate): Secondary | ICD-10-CM | POA: Diagnosis not present

## 2018-02-22 DIAGNOSIS — I10 Essential (primary) hypertension: Secondary | ICD-10-CM | POA: Diagnosis not present

## 2018-02-22 DIAGNOSIS — G309 Alzheimer's disease, unspecified: Secondary | ICD-10-CM | POA: Diagnosis not present

## 2018-02-23 DIAGNOSIS — I10 Essential (primary) hypertension: Secondary | ICD-10-CM | POA: Diagnosis not present

## 2018-02-23 DIAGNOSIS — D551 Anemia due to other disorders of glutathione metabolism: Secondary | ICD-10-CM | POA: Diagnosis not present

## 2018-02-23 DIAGNOSIS — N183 Chronic kidney disease, stage 3 (moderate): Secondary | ICD-10-CM | POA: Diagnosis not present

## 2018-02-23 DIAGNOSIS — G309 Alzheimer's disease, unspecified: Secondary | ICD-10-CM | POA: Diagnosis not present

## 2018-02-23 DIAGNOSIS — F0390 Unspecified dementia without behavioral disturbance: Secondary | ICD-10-CM | POA: Diagnosis not present

## 2018-02-24 DIAGNOSIS — F0390 Unspecified dementia without behavioral disturbance: Secondary | ICD-10-CM | POA: Diagnosis not present

## 2018-02-24 DIAGNOSIS — G309 Alzheimer's disease, unspecified: Secondary | ICD-10-CM | POA: Diagnosis not present

## 2018-02-24 DIAGNOSIS — D551 Anemia due to other disorders of glutathione metabolism: Secondary | ICD-10-CM | POA: Diagnosis not present

## 2018-02-24 DIAGNOSIS — N183 Chronic kidney disease, stage 3 (moderate): Secondary | ICD-10-CM | POA: Diagnosis not present

## 2018-02-24 DIAGNOSIS — I10 Essential (primary) hypertension: Secondary | ICD-10-CM | POA: Diagnosis not present

## 2018-02-27 DIAGNOSIS — G309 Alzheimer's disease, unspecified: Secondary | ICD-10-CM | POA: Diagnosis not present

## 2018-02-27 DIAGNOSIS — F0390 Unspecified dementia without behavioral disturbance: Secondary | ICD-10-CM | POA: Diagnosis not present

## 2018-02-27 DIAGNOSIS — D551 Anemia due to other disorders of glutathione metabolism: Secondary | ICD-10-CM | POA: Diagnosis not present

## 2018-02-27 DIAGNOSIS — I10 Essential (primary) hypertension: Secondary | ICD-10-CM | POA: Diagnosis not present

## 2018-02-27 DIAGNOSIS — N183 Chronic kidney disease, stage 3 (moderate): Secondary | ICD-10-CM | POA: Diagnosis not present

## 2018-02-28 DIAGNOSIS — G309 Alzheimer's disease, unspecified: Secondary | ICD-10-CM | POA: Diagnosis not present

## 2018-02-28 DIAGNOSIS — D551 Anemia due to other disorders of glutathione metabolism: Secondary | ICD-10-CM | POA: Diagnosis not present

## 2018-02-28 DIAGNOSIS — F0390 Unspecified dementia without behavioral disturbance: Secondary | ICD-10-CM | POA: Diagnosis not present

## 2018-02-28 DIAGNOSIS — I10 Essential (primary) hypertension: Secondary | ICD-10-CM | POA: Diagnosis not present

## 2018-02-28 DIAGNOSIS — N183 Chronic kidney disease, stage 3 (moderate): Secondary | ICD-10-CM | POA: Diagnosis not present

## 2018-03-01 DIAGNOSIS — G309 Alzheimer's disease, unspecified: Secondary | ICD-10-CM | POA: Diagnosis not present

## 2018-03-01 DIAGNOSIS — D551 Anemia due to other disorders of glutathione metabolism: Secondary | ICD-10-CM | POA: Diagnosis not present

## 2018-03-01 DIAGNOSIS — N183 Chronic kidney disease, stage 3 (moderate): Secondary | ICD-10-CM | POA: Diagnosis not present

## 2018-03-01 DIAGNOSIS — F0390 Unspecified dementia without behavioral disturbance: Secondary | ICD-10-CM | POA: Diagnosis not present

## 2018-03-01 DIAGNOSIS — I10 Essential (primary) hypertension: Secondary | ICD-10-CM | POA: Diagnosis not present

## 2018-03-02 DIAGNOSIS — I10 Essential (primary) hypertension: Secondary | ICD-10-CM | POA: Diagnosis not present

## 2018-03-02 DIAGNOSIS — N183 Chronic kidney disease, stage 3 (moderate): Secondary | ICD-10-CM | POA: Diagnosis not present

## 2018-03-02 DIAGNOSIS — F0390 Unspecified dementia without behavioral disturbance: Secondary | ICD-10-CM | POA: Diagnosis not present

## 2018-03-02 DIAGNOSIS — G309 Alzheimer's disease, unspecified: Secondary | ICD-10-CM | POA: Diagnosis not present

## 2018-03-02 DIAGNOSIS — D551 Anemia due to other disorders of glutathione metabolism: Secondary | ICD-10-CM | POA: Diagnosis not present

## 2018-03-03 DIAGNOSIS — I10 Essential (primary) hypertension: Secondary | ICD-10-CM | POA: Diagnosis not present

## 2018-03-03 DIAGNOSIS — G309 Alzheimer's disease, unspecified: Secondary | ICD-10-CM | POA: Diagnosis not present

## 2018-03-03 DIAGNOSIS — N183 Chronic kidney disease, stage 3 (moderate): Secondary | ICD-10-CM | POA: Diagnosis not present

## 2018-03-03 DIAGNOSIS — F0390 Unspecified dementia without behavioral disturbance: Secondary | ICD-10-CM | POA: Diagnosis not present

## 2018-03-03 DIAGNOSIS — D551 Anemia due to other disorders of glutathione metabolism: Secondary | ICD-10-CM | POA: Diagnosis not present

## 2018-03-06 DIAGNOSIS — G309 Alzheimer's disease, unspecified: Secondary | ICD-10-CM | POA: Diagnosis not present

## 2018-03-06 DIAGNOSIS — I10 Essential (primary) hypertension: Secondary | ICD-10-CM | POA: Diagnosis not present

## 2018-03-06 DIAGNOSIS — N183 Chronic kidney disease, stage 3 (moderate): Secondary | ICD-10-CM | POA: Diagnosis not present

## 2018-03-06 DIAGNOSIS — D551 Anemia due to other disorders of glutathione metabolism: Secondary | ICD-10-CM | POA: Diagnosis not present

## 2018-03-06 DIAGNOSIS — F0390 Unspecified dementia without behavioral disturbance: Secondary | ICD-10-CM | POA: Diagnosis not present

## 2018-03-07 DIAGNOSIS — D551 Anemia due to other disorders of glutathione metabolism: Secondary | ICD-10-CM | POA: Diagnosis not present

## 2018-03-07 DIAGNOSIS — I10 Essential (primary) hypertension: Secondary | ICD-10-CM | POA: Diagnosis not present

## 2018-03-07 DIAGNOSIS — N183 Chronic kidney disease, stage 3 (moderate): Secondary | ICD-10-CM | POA: Diagnosis not present

## 2018-03-07 DIAGNOSIS — F0390 Unspecified dementia without behavioral disturbance: Secondary | ICD-10-CM | POA: Diagnosis not present

## 2018-03-07 DIAGNOSIS — G309 Alzheimer's disease, unspecified: Secondary | ICD-10-CM | POA: Diagnosis not present

## 2018-03-08 DIAGNOSIS — I10 Essential (primary) hypertension: Secondary | ICD-10-CM | POA: Diagnosis not present

## 2018-03-08 DIAGNOSIS — D551 Anemia due to other disorders of glutathione metabolism: Secondary | ICD-10-CM | POA: Diagnosis not present

## 2018-03-08 DIAGNOSIS — N183 Chronic kidney disease, stage 3 (moderate): Secondary | ICD-10-CM | POA: Diagnosis not present

## 2018-03-08 DIAGNOSIS — G309 Alzheimer's disease, unspecified: Secondary | ICD-10-CM | POA: Diagnosis not present

## 2018-03-08 DIAGNOSIS — F0390 Unspecified dementia without behavioral disturbance: Secondary | ICD-10-CM | POA: Diagnosis not present

## 2018-03-09 DIAGNOSIS — F0390 Unspecified dementia without behavioral disturbance: Secondary | ICD-10-CM | POA: Diagnosis not present

## 2018-03-09 DIAGNOSIS — D551 Anemia due to other disorders of glutathione metabolism: Secondary | ICD-10-CM | POA: Diagnosis not present

## 2018-03-09 DIAGNOSIS — N183 Chronic kidney disease, stage 3 (moderate): Secondary | ICD-10-CM | POA: Diagnosis not present

## 2018-03-09 DIAGNOSIS — I10 Essential (primary) hypertension: Secondary | ICD-10-CM | POA: Diagnosis not present

## 2018-03-09 DIAGNOSIS — G309 Alzheimer's disease, unspecified: Secondary | ICD-10-CM | POA: Diagnosis not present

## 2018-03-10 DIAGNOSIS — D551 Anemia due to other disorders of glutathione metabolism: Secondary | ICD-10-CM | POA: Diagnosis not present

## 2018-03-10 DIAGNOSIS — I10 Essential (primary) hypertension: Secondary | ICD-10-CM | POA: Diagnosis not present

## 2018-03-10 DIAGNOSIS — F0390 Unspecified dementia without behavioral disturbance: Secondary | ICD-10-CM | POA: Diagnosis not present

## 2018-03-10 DIAGNOSIS — G309 Alzheimer's disease, unspecified: Secondary | ICD-10-CM | POA: Diagnosis not present

## 2018-03-10 DIAGNOSIS — N183 Chronic kidney disease, stage 3 (moderate): Secondary | ICD-10-CM | POA: Diagnosis not present

## 2018-03-13 DIAGNOSIS — E440 Moderate protein-calorie malnutrition: Secondary | ICD-10-CM | POA: Diagnosis not present

## 2018-03-13 DIAGNOSIS — I10 Essential (primary) hypertension: Secondary | ICD-10-CM | POA: Diagnosis not present

## 2018-03-13 DIAGNOSIS — D551 Anemia due to other disorders of glutathione metabolism: Secondary | ICD-10-CM | POA: Diagnosis not present

## 2018-03-13 DIAGNOSIS — F0390 Unspecified dementia without behavioral disturbance: Secondary | ICD-10-CM | POA: Diagnosis not present

## 2018-03-13 DIAGNOSIS — G309 Alzheimer's disease, unspecified: Secondary | ICD-10-CM | POA: Diagnosis not present

## 2018-03-13 DIAGNOSIS — N183 Chronic kidney disease, stage 3 (moderate): Secondary | ICD-10-CM | POA: Diagnosis not present

## 2018-03-14 DIAGNOSIS — F0390 Unspecified dementia without behavioral disturbance: Secondary | ICD-10-CM | POA: Diagnosis not present

## 2018-03-14 DIAGNOSIS — D551 Anemia due to other disorders of glutathione metabolism: Secondary | ICD-10-CM | POA: Diagnosis not present

## 2018-03-14 DIAGNOSIS — G309 Alzheimer's disease, unspecified: Secondary | ICD-10-CM | POA: Diagnosis not present

## 2018-03-14 DIAGNOSIS — N183 Chronic kidney disease, stage 3 (moderate): Secondary | ICD-10-CM | POA: Diagnosis not present

## 2018-03-14 DIAGNOSIS — I10 Essential (primary) hypertension: Secondary | ICD-10-CM | POA: Diagnosis not present

## 2018-03-14 DIAGNOSIS — E440 Moderate protein-calorie malnutrition: Secondary | ICD-10-CM | POA: Diagnosis not present

## 2018-03-15 DIAGNOSIS — G309 Alzheimer's disease, unspecified: Secondary | ICD-10-CM | POA: Diagnosis not present

## 2018-03-15 DIAGNOSIS — D551 Anemia due to other disorders of glutathione metabolism: Secondary | ICD-10-CM | POA: Diagnosis not present

## 2018-03-15 DIAGNOSIS — I10 Essential (primary) hypertension: Secondary | ICD-10-CM | POA: Diagnosis not present

## 2018-03-15 DIAGNOSIS — E440 Moderate protein-calorie malnutrition: Secondary | ICD-10-CM | POA: Diagnosis not present

## 2018-03-15 DIAGNOSIS — N183 Chronic kidney disease, stage 3 (moderate): Secondary | ICD-10-CM | POA: Diagnosis not present

## 2018-03-15 DIAGNOSIS — F0390 Unspecified dementia without behavioral disturbance: Secondary | ICD-10-CM | POA: Diagnosis not present

## 2018-03-17 DIAGNOSIS — F0390 Unspecified dementia without behavioral disturbance: Secondary | ICD-10-CM | POA: Diagnosis not present

## 2018-03-17 DIAGNOSIS — G309 Alzheimer's disease, unspecified: Secondary | ICD-10-CM | POA: Diagnosis not present

## 2018-03-17 DIAGNOSIS — D551 Anemia due to other disorders of glutathione metabolism: Secondary | ICD-10-CM | POA: Diagnosis not present

## 2018-03-17 DIAGNOSIS — N183 Chronic kidney disease, stage 3 (moderate): Secondary | ICD-10-CM | POA: Diagnosis not present

## 2018-03-17 DIAGNOSIS — I10 Essential (primary) hypertension: Secondary | ICD-10-CM | POA: Diagnosis not present

## 2018-03-17 DIAGNOSIS — E440 Moderate protein-calorie malnutrition: Secondary | ICD-10-CM | POA: Diagnosis not present

## 2018-03-20 DIAGNOSIS — F0390 Unspecified dementia without behavioral disturbance: Secondary | ICD-10-CM | POA: Diagnosis not present

## 2018-03-20 DIAGNOSIS — N183 Chronic kidney disease, stage 3 (moderate): Secondary | ICD-10-CM | POA: Diagnosis not present

## 2018-03-20 DIAGNOSIS — E440 Moderate protein-calorie malnutrition: Secondary | ICD-10-CM | POA: Diagnosis not present

## 2018-03-20 DIAGNOSIS — D551 Anemia due to other disorders of glutathione metabolism: Secondary | ICD-10-CM | POA: Diagnosis not present

## 2018-03-20 DIAGNOSIS — G309 Alzheimer's disease, unspecified: Secondary | ICD-10-CM | POA: Diagnosis not present

## 2018-03-20 DIAGNOSIS — I10 Essential (primary) hypertension: Secondary | ICD-10-CM | POA: Diagnosis not present

## 2018-03-21 DIAGNOSIS — D551 Anemia due to other disorders of glutathione metabolism: Secondary | ICD-10-CM | POA: Diagnosis not present

## 2018-03-21 DIAGNOSIS — I10 Essential (primary) hypertension: Secondary | ICD-10-CM | POA: Diagnosis not present

## 2018-03-21 DIAGNOSIS — G309 Alzheimer's disease, unspecified: Secondary | ICD-10-CM | POA: Diagnosis not present

## 2018-03-21 DIAGNOSIS — N183 Chronic kidney disease, stage 3 (moderate): Secondary | ICD-10-CM | POA: Diagnosis not present

## 2018-03-21 DIAGNOSIS — F0390 Unspecified dementia without behavioral disturbance: Secondary | ICD-10-CM | POA: Diagnosis not present

## 2018-03-21 DIAGNOSIS — E440 Moderate protein-calorie malnutrition: Secondary | ICD-10-CM | POA: Diagnosis not present

## 2018-03-22 DIAGNOSIS — G309 Alzheimer's disease, unspecified: Secondary | ICD-10-CM | POA: Diagnosis not present

## 2018-03-22 DIAGNOSIS — I10 Essential (primary) hypertension: Secondary | ICD-10-CM | POA: Diagnosis not present

## 2018-03-22 DIAGNOSIS — N183 Chronic kidney disease, stage 3 (moderate): Secondary | ICD-10-CM | POA: Diagnosis not present

## 2018-03-22 DIAGNOSIS — E440 Moderate protein-calorie malnutrition: Secondary | ICD-10-CM | POA: Diagnosis not present

## 2018-03-22 DIAGNOSIS — D551 Anemia due to other disorders of glutathione metabolism: Secondary | ICD-10-CM | POA: Diagnosis not present

## 2018-03-22 DIAGNOSIS — F0390 Unspecified dementia without behavioral disturbance: Secondary | ICD-10-CM | POA: Diagnosis not present

## 2018-03-23 DIAGNOSIS — I10 Essential (primary) hypertension: Secondary | ICD-10-CM | POA: Diagnosis not present

## 2018-03-23 DIAGNOSIS — N183 Chronic kidney disease, stage 3 (moderate): Secondary | ICD-10-CM | POA: Diagnosis not present

## 2018-03-23 DIAGNOSIS — D551 Anemia due to other disorders of glutathione metabolism: Secondary | ICD-10-CM | POA: Diagnosis not present

## 2018-03-23 DIAGNOSIS — G309 Alzheimer's disease, unspecified: Secondary | ICD-10-CM | POA: Diagnosis not present

## 2018-03-23 DIAGNOSIS — E440 Moderate protein-calorie malnutrition: Secondary | ICD-10-CM | POA: Diagnosis not present

## 2018-03-23 DIAGNOSIS — F0390 Unspecified dementia without behavioral disturbance: Secondary | ICD-10-CM | POA: Diagnosis not present

## 2018-03-24 DIAGNOSIS — I10 Essential (primary) hypertension: Secondary | ICD-10-CM | POA: Diagnosis not present

## 2018-03-24 DIAGNOSIS — F0390 Unspecified dementia without behavioral disturbance: Secondary | ICD-10-CM | POA: Diagnosis not present

## 2018-03-24 DIAGNOSIS — D551 Anemia due to other disorders of glutathione metabolism: Secondary | ICD-10-CM | POA: Diagnosis not present

## 2018-03-24 DIAGNOSIS — E440 Moderate protein-calorie malnutrition: Secondary | ICD-10-CM | POA: Diagnosis not present

## 2018-03-24 DIAGNOSIS — N183 Chronic kidney disease, stage 3 (moderate): Secondary | ICD-10-CM | POA: Diagnosis not present

## 2018-03-24 DIAGNOSIS — G309 Alzheimer's disease, unspecified: Secondary | ICD-10-CM | POA: Diagnosis not present

## 2018-03-27 DIAGNOSIS — I10 Essential (primary) hypertension: Secondary | ICD-10-CM | POA: Diagnosis not present

## 2018-03-27 DIAGNOSIS — N183 Chronic kidney disease, stage 3 (moderate): Secondary | ICD-10-CM | POA: Diagnosis not present

## 2018-03-27 DIAGNOSIS — D551 Anemia due to other disorders of glutathione metabolism: Secondary | ICD-10-CM | POA: Diagnosis not present

## 2018-03-27 DIAGNOSIS — E440 Moderate protein-calorie malnutrition: Secondary | ICD-10-CM | POA: Diagnosis not present

## 2018-03-27 DIAGNOSIS — F0390 Unspecified dementia without behavioral disturbance: Secondary | ICD-10-CM | POA: Diagnosis not present

## 2018-03-27 DIAGNOSIS — G309 Alzheimer's disease, unspecified: Secondary | ICD-10-CM | POA: Diagnosis not present

## 2018-03-28 DIAGNOSIS — I10 Essential (primary) hypertension: Secondary | ICD-10-CM | POA: Diagnosis not present

## 2018-03-28 DIAGNOSIS — F0390 Unspecified dementia without behavioral disturbance: Secondary | ICD-10-CM | POA: Diagnosis not present

## 2018-03-28 DIAGNOSIS — G309 Alzheimer's disease, unspecified: Secondary | ICD-10-CM | POA: Diagnosis not present

## 2018-03-28 DIAGNOSIS — N183 Chronic kidney disease, stage 3 (moderate): Secondary | ICD-10-CM | POA: Diagnosis not present

## 2018-03-28 DIAGNOSIS — E440 Moderate protein-calorie malnutrition: Secondary | ICD-10-CM | POA: Diagnosis not present

## 2018-03-28 DIAGNOSIS — D551 Anemia due to other disorders of glutathione metabolism: Secondary | ICD-10-CM | POA: Diagnosis not present

## 2018-03-29 DIAGNOSIS — F0390 Unspecified dementia without behavioral disturbance: Secondary | ICD-10-CM | POA: Diagnosis not present

## 2018-03-29 DIAGNOSIS — D551 Anemia due to other disorders of glutathione metabolism: Secondary | ICD-10-CM | POA: Diagnosis not present

## 2018-03-29 DIAGNOSIS — E440 Moderate protein-calorie malnutrition: Secondary | ICD-10-CM | POA: Diagnosis not present

## 2018-03-29 DIAGNOSIS — I10 Essential (primary) hypertension: Secondary | ICD-10-CM | POA: Diagnosis not present

## 2018-03-29 DIAGNOSIS — G309 Alzheimer's disease, unspecified: Secondary | ICD-10-CM | POA: Diagnosis not present

## 2018-03-29 DIAGNOSIS — N183 Chronic kidney disease, stage 3 (moderate): Secondary | ICD-10-CM | POA: Diagnosis not present

## 2018-03-30 DIAGNOSIS — G309 Alzheimer's disease, unspecified: Secondary | ICD-10-CM | POA: Diagnosis not present

## 2018-03-30 DIAGNOSIS — I10 Essential (primary) hypertension: Secondary | ICD-10-CM | POA: Diagnosis not present

## 2018-03-30 DIAGNOSIS — F0390 Unspecified dementia without behavioral disturbance: Secondary | ICD-10-CM | POA: Diagnosis not present

## 2018-03-30 DIAGNOSIS — N183 Chronic kidney disease, stage 3 (moderate): Secondary | ICD-10-CM | POA: Diagnosis not present

## 2018-03-30 DIAGNOSIS — E440 Moderate protein-calorie malnutrition: Secondary | ICD-10-CM | POA: Diagnosis not present

## 2018-03-30 DIAGNOSIS — D551 Anemia due to other disorders of glutathione metabolism: Secondary | ICD-10-CM | POA: Diagnosis not present

## 2018-03-31 DIAGNOSIS — F0390 Unspecified dementia without behavioral disturbance: Secondary | ICD-10-CM | POA: Diagnosis not present

## 2018-03-31 DIAGNOSIS — G309 Alzheimer's disease, unspecified: Secondary | ICD-10-CM | POA: Diagnosis not present

## 2018-03-31 DIAGNOSIS — E440 Moderate protein-calorie malnutrition: Secondary | ICD-10-CM | POA: Diagnosis not present

## 2018-03-31 DIAGNOSIS — N183 Chronic kidney disease, stage 3 (moderate): Secondary | ICD-10-CM | POA: Diagnosis not present

## 2018-03-31 DIAGNOSIS — I10 Essential (primary) hypertension: Secondary | ICD-10-CM | POA: Diagnosis not present

## 2018-03-31 DIAGNOSIS — D551 Anemia due to other disorders of glutathione metabolism: Secondary | ICD-10-CM | POA: Diagnosis not present

## 2018-04-03 DIAGNOSIS — I10 Essential (primary) hypertension: Secondary | ICD-10-CM | POA: Diagnosis not present

## 2018-04-03 DIAGNOSIS — D551 Anemia due to other disorders of glutathione metabolism: Secondary | ICD-10-CM | POA: Diagnosis not present

## 2018-04-03 DIAGNOSIS — E440 Moderate protein-calorie malnutrition: Secondary | ICD-10-CM | POA: Diagnosis not present

## 2018-04-03 DIAGNOSIS — F0390 Unspecified dementia without behavioral disturbance: Secondary | ICD-10-CM | POA: Diagnosis not present

## 2018-04-03 DIAGNOSIS — N183 Chronic kidney disease, stage 3 (moderate): Secondary | ICD-10-CM | POA: Diagnosis not present

## 2018-04-03 DIAGNOSIS — G309 Alzheimer's disease, unspecified: Secondary | ICD-10-CM | POA: Diagnosis not present

## 2018-04-04 DIAGNOSIS — D551 Anemia due to other disorders of glutathione metabolism: Secondary | ICD-10-CM | POA: Diagnosis not present

## 2018-04-04 DIAGNOSIS — E440 Moderate protein-calorie malnutrition: Secondary | ICD-10-CM | POA: Diagnosis not present

## 2018-04-04 DIAGNOSIS — F0390 Unspecified dementia without behavioral disturbance: Secondary | ICD-10-CM | POA: Diagnosis not present

## 2018-04-04 DIAGNOSIS — G309 Alzheimer's disease, unspecified: Secondary | ICD-10-CM | POA: Diagnosis not present

## 2018-04-04 DIAGNOSIS — N183 Chronic kidney disease, stage 3 (moderate): Secondary | ICD-10-CM | POA: Diagnosis not present

## 2018-04-04 DIAGNOSIS — I10 Essential (primary) hypertension: Secondary | ICD-10-CM | POA: Diagnosis not present

## 2018-04-05 DIAGNOSIS — D551 Anemia due to other disorders of glutathione metabolism: Secondary | ICD-10-CM | POA: Diagnosis not present

## 2018-04-05 DIAGNOSIS — F0390 Unspecified dementia without behavioral disturbance: Secondary | ICD-10-CM | POA: Diagnosis not present

## 2018-04-05 DIAGNOSIS — I10 Essential (primary) hypertension: Secondary | ICD-10-CM | POA: Diagnosis not present

## 2018-04-05 DIAGNOSIS — G309 Alzheimer's disease, unspecified: Secondary | ICD-10-CM | POA: Diagnosis not present

## 2018-04-05 DIAGNOSIS — N183 Chronic kidney disease, stage 3 (moderate): Secondary | ICD-10-CM | POA: Diagnosis not present

## 2018-04-05 DIAGNOSIS — E440 Moderate protein-calorie malnutrition: Secondary | ICD-10-CM | POA: Diagnosis not present

## 2018-04-06 DIAGNOSIS — D551 Anemia due to other disorders of glutathione metabolism: Secondary | ICD-10-CM | POA: Diagnosis not present

## 2018-04-06 DIAGNOSIS — N183 Chronic kidney disease, stage 3 (moderate): Secondary | ICD-10-CM | POA: Diagnosis not present

## 2018-04-06 DIAGNOSIS — F0390 Unspecified dementia without behavioral disturbance: Secondary | ICD-10-CM | POA: Diagnosis not present

## 2018-04-06 DIAGNOSIS — E440 Moderate protein-calorie malnutrition: Secondary | ICD-10-CM | POA: Diagnosis not present

## 2018-04-06 DIAGNOSIS — I10 Essential (primary) hypertension: Secondary | ICD-10-CM | POA: Diagnosis not present

## 2018-04-06 DIAGNOSIS — G309 Alzheimer's disease, unspecified: Secondary | ICD-10-CM | POA: Diagnosis not present

## 2018-04-07 DIAGNOSIS — I10 Essential (primary) hypertension: Secondary | ICD-10-CM | POA: Diagnosis not present

## 2018-04-07 DIAGNOSIS — N183 Chronic kidney disease, stage 3 (moderate): Secondary | ICD-10-CM | POA: Diagnosis not present

## 2018-04-07 DIAGNOSIS — G309 Alzheimer's disease, unspecified: Secondary | ICD-10-CM | POA: Diagnosis not present

## 2018-04-07 DIAGNOSIS — E440 Moderate protein-calorie malnutrition: Secondary | ICD-10-CM | POA: Diagnosis not present

## 2018-04-07 DIAGNOSIS — F0390 Unspecified dementia without behavioral disturbance: Secondary | ICD-10-CM | POA: Diagnosis not present

## 2018-04-07 DIAGNOSIS — D551 Anemia due to other disorders of glutathione metabolism: Secondary | ICD-10-CM | POA: Diagnosis not present

## 2018-04-10 DIAGNOSIS — F0390 Unspecified dementia without behavioral disturbance: Secondary | ICD-10-CM | POA: Diagnosis not present

## 2018-04-10 DIAGNOSIS — D551 Anemia due to other disorders of glutathione metabolism: Secondary | ICD-10-CM | POA: Diagnosis not present

## 2018-04-10 DIAGNOSIS — G309 Alzheimer's disease, unspecified: Secondary | ICD-10-CM | POA: Diagnosis not present

## 2018-04-10 DIAGNOSIS — N183 Chronic kidney disease, stage 3 (moderate): Secondary | ICD-10-CM | POA: Diagnosis not present

## 2018-04-10 DIAGNOSIS — I10 Essential (primary) hypertension: Secondary | ICD-10-CM | POA: Diagnosis not present

## 2018-04-10 DIAGNOSIS — E440 Moderate protein-calorie malnutrition: Secondary | ICD-10-CM | POA: Diagnosis not present

## 2018-04-11 DIAGNOSIS — E440 Moderate protein-calorie malnutrition: Secondary | ICD-10-CM | POA: Diagnosis not present

## 2018-04-11 DIAGNOSIS — F0390 Unspecified dementia without behavioral disturbance: Secondary | ICD-10-CM | POA: Diagnosis not present

## 2018-04-11 DIAGNOSIS — D551 Anemia due to other disorders of glutathione metabolism: Secondary | ICD-10-CM | POA: Diagnosis not present

## 2018-04-11 DIAGNOSIS — N183 Chronic kidney disease, stage 3 (moderate): Secondary | ICD-10-CM | POA: Diagnosis not present

## 2018-04-11 DIAGNOSIS — I10 Essential (primary) hypertension: Secondary | ICD-10-CM | POA: Diagnosis not present

## 2018-04-11 DIAGNOSIS — G309 Alzheimer's disease, unspecified: Secondary | ICD-10-CM | POA: Diagnosis not present

## 2018-04-12 DIAGNOSIS — E440 Moderate protein-calorie malnutrition: Secondary | ICD-10-CM | POA: Diagnosis not present

## 2018-04-12 DIAGNOSIS — G309 Alzheimer's disease, unspecified: Secondary | ICD-10-CM | POA: Diagnosis not present

## 2018-04-12 DIAGNOSIS — I10 Essential (primary) hypertension: Secondary | ICD-10-CM | POA: Diagnosis not present

## 2018-04-12 DIAGNOSIS — D551 Anemia due to other disorders of glutathione metabolism: Secondary | ICD-10-CM | POA: Diagnosis not present

## 2018-04-12 DIAGNOSIS — F0390 Unspecified dementia without behavioral disturbance: Secondary | ICD-10-CM | POA: Diagnosis not present

## 2018-04-12 DIAGNOSIS — N183 Chronic kidney disease, stage 3 (moderate): Secondary | ICD-10-CM | POA: Diagnosis not present

## 2018-04-13 DIAGNOSIS — G309 Alzheimer's disease, unspecified: Secondary | ICD-10-CM | POA: Diagnosis not present

## 2018-04-13 DIAGNOSIS — F0390 Unspecified dementia without behavioral disturbance: Secondary | ICD-10-CM | POA: Diagnosis not present

## 2018-04-13 DIAGNOSIS — D551 Anemia due to other disorders of glutathione metabolism: Secondary | ICD-10-CM | POA: Diagnosis not present

## 2018-04-13 DIAGNOSIS — I10 Essential (primary) hypertension: Secondary | ICD-10-CM | POA: Diagnosis not present

## 2018-04-13 DIAGNOSIS — N183 Chronic kidney disease, stage 3 (moderate): Secondary | ICD-10-CM | POA: Diagnosis not present

## 2018-04-13 DIAGNOSIS — E43 Unspecified severe protein-calorie malnutrition: Secondary | ICD-10-CM | POA: Diagnosis not present

## 2018-04-14 DIAGNOSIS — N183 Chronic kidney disease, stage 3 (moderate): Secondary | ICD-10-CM | POA: Diagnosis not present

## 2018-04-14 DIAGNOSIS — E43 Unspecified severe protein-calorie malnutrition: Secondary | ICD-10-CM | POA: Diagnosis not present

## 2018-04-14 DIAGNOSIS — F0390 Unspecified dementia without behavioral disturbance: Secondary | ICD-10-CM | POA: Diagnosis not present

## 2018-04-14 DIAGNOSIS — G309 Alzheimer's disease, unspecified: Secondary | ICD-10-CM | POA: Diagnosis not present

## 2018-04-14 DIAGNOSIS — D551 Anemia due to other disorders of glutathione metabolism: Secondary | ICD-10-CM | POA: Diagnosis not present

## 2018-04-14 DIAGNOSIS — I10 Essential (primary) hypertension: Secondary | ICD-10-CM | POA: Diagnosis not present

## 2018-04-17 DIAGNOSIS — G309 Alzheimer's disease, unspecified: Secondary | ICD-10-CM | POA: Diagnosis not present

## 2018-04-17 DIAGNOSIS — N183 Chronic kidney disease, stage 3 (moderate): Secondary | ICD-10-CM | POA: Diagnosis not present

## 2018-04-17 DIAGNOSIS — F0390 Unspecified dementia without behavioral disturbance: Secondary | ICD-10-CM | POA: Diagnosis not present

## 2018-04-17 DIAGNOSIS — E43 Unspecified severe protein-calorie malnutrition: Secondary | ICD-10-CM | POA: Diagnosis not present

## 2018-04-17 DIAGNOSIS — I10 Essential (primary) hypertension: Secondary | ICD-10-CM | POA: Diagnosis not present

## 2018-04-17 DIAGNOSIS — D551 Anemia due to other disorders of glutathione metabolism: Secondary | ICD-10-CM | POA: Diagnosis not present

## 2018-04-18 DIAGNOSIS — N183 Chronic kidney disease, stage 3 (moderate): Secondary | ICD-10-CM | POA: Diagnosis not present

## 2018-04-18 DIAGNOSIS — E43 Unspecified severe protein-calorie malnutrition: Secondary | ICD-10-CM | POA: Diagnosis not present

## 2018-04-18 DIAGNOSIS — I10 Essential (primary) hypertension: Secondary | ICD-10-CM | POA: Diagnosis not present

## 2018-04-18 DIAGNOSIS — F0390 Unspecified dementia without behavioral disturbance: Secondary | ICD-10-CM | POA: Diagnosis not present

## 2018-04-18 DIAGNOSIS — G309 Alzheimer's disease, unspecified: Secondary | ICD-10-CM | POA: Diagnosis not present

## 2018-04-18 DIAGNOSIS — D551 Anemia due to other disorders of glutathione metabolism: Secondary | ICD-10-CM | POA: Diagnosis not present

## 2018-04-19 DIAGNOSIS — F0390 Unspecified dementia without behavioral disturbance: Secondary | ICD-10-CM | POA: Diagnosis not present

## 2018-04-19 DIAGNOSIS — I10 Essential (primary) hypertension: Secondary | ICD-10-CM | POA: Diagnosis not present

## 2018-04-19 DIAGNOSIS — E43 Unspecified severe protein-calorie malnutrition: Secondary | ICD-10-CM | POA: Diagnosis not present

## 2018-04-19 DIAGNOSIS — G309 Alzheimer's disease, unspecified: Secondary | ICD-10-CM | POA: Diagnosis not present

## 2018-04-19 DIAGNOSIS — N183 Chronic kidney disease, stage 3 (moderate): Secondary | ICD-10-CM | POA: Diagnosis not present

## 2018-04-19 DIAGNOSIS — D551 Anemia due to other disorders of glutathione metabolism: Secondary | ICD-10-CM | POA: Diagnosis not present

## 2018-04-21 DIAGNOSIS — G309 Alzheimer's disease, unspecified: Secondary | ICD-10-CM | POA: Diagnosis not present

## 2018-04-21 DIAGNOSIS — D551 Anemia due to other disorders of glutathione metabolism: Secondary | ICD-10-CM | POA: Diagnosis not present

## 2018-04-21 DIAGNOSIS — I10 Essential (primary) hypertension: Secondary | ICD-10-CM | POA: Diagnosis not present

## 2018-04-21 DIAGNOSIS — F0390 Unspecified dementia without behavioral disturbance: Secondary | ICD-10-CM | POA: Diagnosis not present

## 2018-04-21 DIAGNOSIS — E43 Unspecified severe protein-calorie malnutrition: Secondary | ICD-10-CM | POA: Diagnosis not present

## 2018-04-21 DIAGNOSIS — N183 Chronic kidney disease, stage 3 (moderate): Secondary | ICD-10-CM | POA: Diagnosis not present

## 2018-04-24 DIAGNOSIS — F0390 Unspecified dementia without behavioral disturbance: Secondary | ICD-10-CM | POA: Diagnosis not present

## 2018-04-24 DIAGNOSIS — N183 Chronic kidney disease, stage 3 (moderate): Secondary | ICD-10-CM | POA: Diagnosis not present

## 2018-04-24 DIAGNOSIS — I10 Essential (primary) hypertension: Secondary | ICD-10-CM | POA: Diagnosis not present

## 2018-04-24 DIAGNOSIS — G309 Alzheimer's disease, unspecified: Secondary | ICD-10-CM | POA: Diagnosis not present

## 2018-04-24 DIAGNOSIS — E43 Unspecified severe protein-calorie malnutrition: Secondary | ICD-10-CM | POA: Diagnosis not present

## 2018-04-24 DIAGNOSIS — D551 Anemia due to other disorders of glutathione metabolism: Secondary | ICD-10-CM | POA: Diagnosis not present

## 2018-04-25 DIAGNOSIS — F0390 Unspecified dementia without behavioral disturbance: Secondary | ICD-10-CM | POA: Diagnosis not present

## 2018-04-25 DIAGNOSIS — D551 Anemia due to other disorders of glutathione metabolism: Secondary | ICD-10-CM | POA: Diagnosis not present

## 2018-04-25 DIAGNOSIS — E43 Unspecified severe protein-calorie malnutrition: Secondary | ICD-10-CM | POA: Diagnosis not present

## 2018-04-25 DIAGNOSIS — G309 Alzheimer's disease, unspecified: Secondary | ICD-10-CM | POA: Diagnosis not present

## 2018-04-25 DIAGNOSIS — I10 Essential (primary) hypertension: Secondary | ICD-10-CM | POA: Diagnosis not present

## 2018-04-25 DIAGNOSIS — N183 Chronic kidney disease, stage 3 (moderate): Secondary | ICD-10-CM | POA: Diagnosis not present

## 2018-04-26 DIAGNOSIS — I10 Essential (primary) hypertension: Secondary | ICD-10-CM | POA: Diagnosis not present

## 2018-04-26 DIAGNOSIS — G309 Alzheimer's disease, unspecified: Secondary | ICD-10-CM | POA: Diagnosis not present

## 2018-04-26 DIAGNOSIS — E43 Unspecified severe protein-calorie malnutrition: Secondary | ICD-10-CM | POA: Diagnosis not present

## 2018-04-26 DIAGNOSIS — N183 Chronic kidney disease, stage 3 (moderate): Secondary | ICD-10-CM | POA: Diagnosis not present

## 2018-04-26 DIAGNOSIS — D551 Anemia due to other disorders of glutathione metabolism: Secondary | ICD-10-CM | POA: Diagnosis not present

## 2018-04-26 DIAGNOSIS — F0390 Unspecified dementia without behavioral disturbance: Secondary | ICD-10-CM | POA: Diagnosis not present

## 2018-04-27 DIAGNOSIS — G309 Alzheimer's disease, unspecified: Secondary | ICD-10-CM | POA: Diagnosis not present

## 2018-04-27 DIAGNOSIS — F0390 Unspecified dementia without behavioral disturbance: Secondary | ICD-10-CM | POA: Diagnosis not present

## 2018-04-27 DIAGNOSIS — I10 Essential (primary) hypertension: Secondary | ICD-10-CM | POA: Diagnosis not present

## 2018-04-27 DIAGNOSIS — N183 Chronic kidney disease, stage 3 (moderate): Secondary | ICD-10-CM | POA: Diagnosis not present

## 2018-04-27 DIAGNOSIS — E43 Unspecified severe protein-calorie malnutrition: Secondary | ICD-10-CM | POA: Diagnosis not present

## 2018-04-27 DIAGNOSIS — D551 Anemia due to other disorders of glutathione metabolism: Secondary | ICD-10-CM | POA: Diagnosis not present

## 2018-04-28 DIAGNOSIS — D551 Anemia due to other disorders of glutathione metabolism: Secondary | ICD-10-CM | POA: Diagnosis not present

## 2018-04-28 DIAGNOSIS — N183 Chronic kidney disease, stage 3 (moderate): Secondary | ICD-10-CM | POA: Diagnosis not present

## 2018-04-28 DIAGNOSIS — F0390 Unspecified dementia without behavioral disturbance: Secondary | ICD-10-CM | POA: Diagnosis not present

## 2018-04-28 DIAGNOSIS — I10 Essential (primary) hypertension: Secondary | ICD-10-CM | POA: Diagnosis not present

## 2018-04-28 DIAGNOSIS — G309 Alzheimer's disease, unspecified: Secondary | ICD-10-CM | POA: Diagnosis not present

## 2018-04-28 DIAGNOSIS — E43 Unspecified severe protein-calorie malnutrition: Secondary | ICD-10-CM | POA: Diagnosis not present

## 2018-05-01 DIAGNOSIS — E43 Unspecified severe protein-calorie malnutrition: Secondary | ICD-10-CM | POA: Diagnosis not present

## 2018-05-01 DIAGNOSIS — N183 Chronic kidney disease, stage 3 (moderate): Secondary | ICD-10-CM | POA: Diagnosis not present

## 2018-05-01 DIAGNOSIS — D551 Anemia due to other disorders of glutathione metabolism: Secondary | ICD-10-CM | POA: Diagnosis not present

## 2018-05-01 DIAGNOSIS — F0390 Unspecified dementia without behavioral disturbance: Secondary | ICD-10-CM | POA: Diagnosis not present

## 2018-05-01 DIAGNOSIS — G309 Alzheimer's disease, unspecified: Secondary | ICD-10-CM | POA: Diagnosis not present

## 2018-05-01 DIAGNOSIS — I10 Essential (primary) hypertension: Secondary | ICD-10-CM | POA: Diagnosis not present

## 2018-05-02 DIAGNOSIS — D551 Anemia due to other disorders of glutathione metabolism: Secondary | ICD-10-CM | POA: Diagnosis not present

## 2018-05-02 DIAGNOSIS — F0390 Unspecified dementia without behavioral disturbance: Secondary | ICD-10-CM | POA: Diagnosis not present

## 2018-05-02 DIAGNOSIS — N183 Chronic kidney disease, stage 3 (moderate): Secondary | ICD-10-CM | POA: Diagnosis not present

## 2018-05-02 DIAGNOSIS — E43 Unspecified severe protein-calorie malnutrition: Secondary | ICD-10-CM | POA: Diagnosis not present

## 2018-05-02 DIAGNOSIS — G309 Alzheimer's disease, unspecified: Secondary | ICD-10-CM | POA: Diagnosis not present

## 2018-05-02 DIAGNOSIS — I10 Essential (primary) hypertension: Secondary | ICD-10-CM | POA: Diagnosis not present

## 2018-05-03 DIAGNOSIS — N183 Chronic kidney disease, stage 3 (moderate): Secondary | ICD-10-CM | POA: Diagnosis not present

## 2018-05-03 DIAGNOSIS — I10 Essential (primary) hypertension: Secondary | ICD-10-CM | POA: Diagnosis not present

## 2018-05-03 DIAGNOSIS — G309 Alzheimer's disease, unspecified: Secondary | ICD-10-CM | POA: Diagnosis not present

## 2018-05-03 DIAGNOSIS — D551 Anemia due to other disorders of glutathione metabolism: Secondary | ICD-10-CM | POA: Diagnosis not present

## 2018-05-03 DIAGNOSIS — E43 Unspecified severe protein-calorie malnutrition: Secondary | ICD-10-CM | POA: Diagnosis not present

## 2018-05-03 DIAGNOSIS — F0390 Unspecified dementia without behavioral disturbance: Secondary | ICD-10-CM | POA: Diagnosis not present

## 2018-05-04 DIAGNOSIS — F0390 Unspecified dementia without behavioral disturbance: Secondary | ICD-10-CM | POA: Diagnosis not present

## 2018-05-04 DIAGNOSIS — N183 Chronic kidney disease, stage 3 (moderate): Secondary | ICD-10-CM | POA: Diagnosis not present

## 2018-05-04 DIAGNOSIS — I10 Essential (primary) hypertension: Secondary | ICD-10-CM | POA: Diagnosis not present

## 2018-05-04 DIAGNOSIS — E43 Unspecified severe protein-calorie malnutrition: Secondary | ICD-10-CM | POA: Diagnosis not present

## 2018-05-04 DIAGNOSIS — D551 Anemia due to other disorders of glutathione metabolism: Secondary | ICD-10-CM | POA: Diagnosis not present

## 2018-05-04 DIAGNOSIS — G309 Alzheimer's disease, unspecified: Secondary | ICD-10-CM | POA: Diagnosis not present

## 2018-05-14 DEATH — deceased
